# Patient Record
Sex: Female | Born: 1945 | Hispanic: No | Marital: Married | Smoking: Never smoker
Health system: Southern US, Community
[De-identification: ages and names within clinical notes are randomized; demographics above are authoritative.]

## PROBLEM LIST (undated history)

## (undated) DIAGNOSIS — Z923 Personal history of irradiation: Secondary | ICD-10-CM

## (undated) DIAGNOSIS — I5023 Acute on chronic systolic (congestive) heart failure: Secondary | ICD-10-CM

## (undated) DIAGNOSIS — E039 Hypothyroidism, unspecified: Secondary | ICD-10-CM

## (undated) DIAGNOSIS — I10 Essential (primary) hypertension: Secondary | ICD-10-CM

## (undated) DIAGNOSIS — C801 Malignant (primary) neoplasm, unspecified: Secondary | ICD-10-CM

## (undated) DIAGNOSIS — D239 Other benign neoplasm of skin, unspecified: Secondary | ICD-10-CM

## (undated) DIAGNOSIS — I519 Heart disease, unspecified: Secondary | ICD-10-CM

## (undated) DIAGNOSIS — G4733 Obstructive sleep apnea (adult) (pediatric): Secondary | ICD-10-CM

## (undated) DIAGNOSIS — E7800 Pure hypercholesterolemia, unspecified: Secondary | ICD-10-CM

## (undated) DIAGNOSIS — N289 Disorder of kidney and ureter, unspecified: Secondary | ICD-10-CM

## (undated) DIAGNOSIS — N179 Acute kidney failure, unspecified: Secondary | ICD-10-CM

## (undated) DIAGNOSIS — I427 Cardiomyopathy due to drug and external agent: Secondary | ICD-10-CM

## (undated) DIAGNOSIS — R1013 Epigastric pain: Secondary | ICD-10-CM

## (undated) DIAGNOSIS — I420 Dilated cardiomyopathy: Secondary | ICD-10-CM

## (undated) DIAGNOSIS — I959 Hypotension, unspecified: Secondary | ICD-10-CM

## (undated) DIAGNOSIS — T451X5A Adverse effect of antineoplastic and immunosuppressive drugs, initial encounter: Secondary | ICD-10-CM

## (undated) DIAGNOSIS — H409 Unspecified glaucoma: Secondary | ICD-10-CM

## (undated) DIAGNOSIS — K589 Irritable bowel syndrome without diarrhea: Secondary | ICD-10-CM

## (undated) DIAGNOSIS — E785 Hyperlipidemia, unspecified: Secondary | ICD-10-CM

## (undated) DIAGNOSIS — J45909 Unspecified asthma, uncomplicated: Secondary | ICD-10-CM

## (undated) DIAGNOSIS — I110 Hypertensive heart disease with heart failure: Secondary | ICD-10-CM

## (undated) DIAGNOSIS — C50919 Malignant neoplasm of unspecified site of unspecified female breast: Secondary | ICD-10-CM

## (undated) DIAGNOSIS — D649 Anemia, unspecified: Secondary | ICD-10-CM

## (undated) HISTORY — DX: Obstructive sleep apnea (adult) (pediatric): G47.33

## (undated) HISTORY — DX: Heart disease, unspecified: I51.9

## (undated) HISTORY — DX: Dilated cardiomyopathy: I42.0

## (undated) HISTORY — DX: Epigastric pain: R10.13

## (undated) HISTORY — PX: SHOULDER CLOSED REDUCTION: SHX1051

## (undated) HISTORY — DX: Other benign neoplasm of skin, unspecified: D23.9

## (undated) HISTORY — DX: Malignant (primary) neoplasm, unspecified: C80.1

## (undated) HISTORY — DX: Hypotension, unspecified: I95.9

## (undated) HISTORY — DX: Acute kidney failure, unspecified: N17.9

## (undated) HISTORY — DX: Essential (primary) hypertension: I10

## (undated) HISTORY — PX: ROTATOR CUFF REPAIR: SHX139

## (undated) HISTORY — DX: Hyperlipidemia, unspecified: E78.5

## (undated) HISTORY — DX: Irritable bowel syndrome without diarrhea: K58.9

## (undated) HISTORY — DX: Adverse effect of antineoplastic and immunosuppressive drugs, initial encounter: T45.1X5A

## (undated) HISTORY — DX: Hypertensive heart disease with heart failure: I11.0

## (undated) HISTORY — DX: Acute on chronic systolic (congestive) heart failure: I50.23

## (undated) HISTORY — DX: Cardiomyopathy due to drug and external agent: I42.7

## (undated) HISTORY — DX: Disorder of kidney and ureter, unspecified: N28.9

## (undated) HISTORY — DX: Anemia, unspecified: D64.9

## (undated) HISTORY — PX: CARDIAC CATHETERIZATION: SHX172

## (undated) HISTORY — DX: Unspecified asthma, uncomplicated: J45.909

## (undated) HISTORY — DX: Hypothyroidism, unspecified: E03.9

## (undated) HISTORY — DX: Unspecified glaucoma: H40.9

---

## 1999-04-14 DIAGNOSIS — Z923 Personal history of irradiation: Secondary | ICD-10-CM | POA: Insufficient documentation

## 1999-04-14 DIAGNOSIS — C50919 Malignant neoplasm of unspecified site of unspecified female breast: Secondary | ICD-10-CM

## 1999-04-14 HISTORY — DX: Personal history of irradiation: Z92.3

## 1999-04-14 HISTORY — DX: Malignant neoplasm of unspecified site of unspecified female breast: C50.919

## 1999-04-14 HISTORY — PX: BREAST LUMPECTOMY: SHX2

## 2002-09-27 ENCOUNTER — Encounter: Admission: RE | Admit: 2002-09-27 | Discharge: 2002-09-27 | Payer: Self-pay | Admitting: Hematology & Oncology

## 2002-09-27 ENCOUNTER — Encounter: Payer: Self-pay | Admitting: Hematology & Oncology

## 2003-09-28 ENCOUNTER — Encounter: Admission: RE | Admit: 2003-09-28 | Discharge: 2003-09-28 | Payer: Self-pay | Admitting: Hematology & Oncology

## 2004-01-17 ENCOUNTER — Encounter: Admission: RE | Admit: 2004-01-17 | Discharge: 2004-01-17 | Payer: Self-pay | Admitting: Family Medicine

## 2004-07-17 ENCOUNTER — Ambulatory Visit: Payer: Self-pay | Admitting: Hematology & Oncology

## 2004-08-12 ENCOUNTER — Encounter: Admission: RE | Admit: 2004-08-12 | Discharge: 2004-09-22 | Payer: Self-pay | Admitting: Orthopedic Surgery

## 2004-09-29 ENCOUNTER — Encounter: Admission: RE | Admit: 2004-09-29 | Discharge: 2004-09-29 | Payer: Self-pay | Admitting: Hematology & Oncology

## 2005-01-27 ENCOUNTER — Ambulatory Visit: Payer: Self-pay | Admitting: Hematology & Oncology

## 2005-07-28 ENCOUNTER — Ambulatory Visit: Payer: Self-pay | Admitting: Hematology & Oncology

## 2005-11-09 ENCOUNTER — Encounter: Admission: RE | Admit: 2005-11-09 | Discharge: 2005-11-09 | Payer: Self-pay | Admitting: Hematology & Oncology

## 2006-01-21 ENCOUNTER — Ambulatory Visit: Payer: Self-pay | Admitting: Hematology & Oncology

## 2006-01-22 ENCOUNTER — Encounter: Admission: RE | Admit: 2006-01-22 | Discharge: 2006-01-22 | Payer: Self-pay | Admitting: Family Medicine

## 2006-11-18 ENCOUNTER — Encounter: Admission: RE | Admit: 2006-11-18 | Discharge: 2006-11-18 | Payer: Self-pay | Admitting: Hematology & Oncology

## 2007-01-20 ENCOUNTER — Ambulatory Visit: Payer: Self-pay | Admitting: Hematology & Oncology

## 2007-01-24 LAB — CBC WITH DIFFERENTIAL/PLATELET
Basophils Absolute: 0.1 10*3/uL (ref 0.0–0.1)
Eosinophils Absolute: 0.4 10*3/uL (ref 0.0–0.5)
HCT: 34.7 % — ABNORMAL LOW (ref 34.8–46.6)
HGB: 11.6 g/dL (ref 11.6–15.9)
LYMPH%: 35.5 % (ref 14.0–48.0)
MONO#: 0.7 10*3/uL (ref 0.1–0.9)
NEUT#: 2.9 10*3/uL (ref 1.5–6.5)
NEUT%: 46.5 % (ref 39.6–76.8)
Platelets: 296 10*3/uL (ref 145–400)
WBC: 6.3 10*3/uL (ref 3.9–10.0)
lymph#: 2.2 10*3/uL (ref 0.9–3.3)

## 2007-01-24 LAB — COMPREHENSIVE METABOLIC PANEL
ALT: 17 U/L (ref 0–35)
CO2: 25 mEq/L (ref 19–32)
Calcium: 9.1 mg/dL (ref 8.4–10.5)
Chloride: 107 mEq/L (ref 96–112)
Creatinine, Ser: 0.88 mg/dL (ref 0.40–1.20)
Glucose, Bld: 96 mg/dL (ref 70–99)
Total Bilirubin: 0.5 mg/dL (ref 0.3–1.2)

## 2007-11-22 ENCOUNTER — Encounter: Admission: RE | Admit: 2007-11-22 | Discharge: 2007-11-22 | Payer: Self-pay | Admitting: Hematology & Oncology

## 2008-01-20 ENCOUNTER — Ambulatory Visit: Payer: Self-pay | Admitting: Hematology & Oncology

## 2008-01-23 LAB — CBC WITH DIFFERENTIAL (CANCER CENTER ONLY)
BASO%: 0.6 % (ref 0.0–2.0)
Eosinophils Absolute: 0.4 10*3/uL (ref 0.0–0.5)
LYMPH%: 21 % (ref 14.0–48.0)
MCV: 79 fL — ABNORMAL LOW (ref 81–101)
MONO#: 0.6 10*3/uL (ref 0.1–0.9)
NEUT#: 5.4 10*3/uL (ref 1.5–6.5)
Platelets: 277 10*3/uL (ref 145–400)
RBC: 4.53 10*6/uL (ref 3.70–5.32)
RDW: 13.9 % (ref 10.5–14.6)
WBC: 8.2 10*3/uL (ref 3.9–10.0)

## 2008-01-23 LAB — COMPREHENSIVE METABOLIC PANEL
ALT: 13 U/L (ref 0–35)
BUN: 17 mg/dL (ref 6–23)
CO2: 26 mEq/L (ref 19–32)
Calcium: 9.8 mg/dL (ref 8.4–10.5)
Creatinine, Ser: 0.93 mg/dL (ref 0.40–1.20)
Glucose, Bld: 76 mg/dL (ref 70–99)
Total Bilirubin: 0.5 mg/dL (ref 0.3–1.2)

## 2008-06-29 ENCOUNTER — Encounter: Admission: RE | Admit: 2008-06-29 | Discharge: 2008-06-29 | Payer: Self-pay | Admitting: Family Medicine

## 2008-12-14 ENCOUNTER — Encounter: Admission: RE | Admit: 2008-12-14 | Discharge: 2008-12-14 | Payer: Self-pay | Admitting: Family Medicine

## 2009-01-15 ENCOUNTER — Ambulatory Visit: Payer: Self-pay | Admitting: Hematology & Oncology

## 2009-01-16 LAB — CBC WITH DIFFERENTIAL (CANCER CENTER ONLY)
BASO%: 0.6 % (ref 0.0–2.0)
EOS%: 5.4 % (ref 0.0–7.0)
Eosinophils Absolute: 0.3 10*3/uL (ref 0.0–0.5)
LYMPH%: 29 % (ref 14.0–48.0)
MCH: 23.9 pg — ABNORMAL LOW (ref 26.0–34.0)
MCHC: 32.3 g/dL (ref 32.0–36.0)
MCV: 74 fL — ABNORMAL LOW (ref 81–101)
MONO%: 6.1 % (ref 0.0–13.0)
NEUT#: 3.7 10*3/uL (ref 1.5–6.5)
Platelets: 322 10*3/uL (ref 145–400)
RBC: 4.63 10*6/uL (ref 3.70–5.32)
RDW: 17 % — ABNORMAL HIGH (ref 10.5–14.6)

## 2009-01-17 LAB — COMPREHENSIVE METABOLIC PANEL
ALT: 17 U/L (ref 0–35)
AST: 16 U/L (ref 0–37)
Albumin: 4.2 g/dL (ref 3.5–5.2)
Alkaline Phosphatase: 49 U/L (ref 39–117)
BUN: 13 mg/dL (ref 6–23)
CO2: 25 mEq/L (ref 19–32)
Calcium: 9 mg/dL (ref 8.4–10.5)
Chloride: 104 mEq/L (ref 96–112)
Creatinine, Ser: 1.15 mg/dL (ref 0.40–1.20)
Glucose, Bld: 80 mg/dL (ref 70–99)
Potassium: 4.8 mEq/L (ref 3.5–5.3)
Sodium: 139 mEq/L (ref 135–145)
Total Bilirubin: 0.5 mg/dL (ref 0.3–1.2)
Total Protein: 6.8 g/dL (ref 6.0–8.3)

## 2009-01-21 LAB — FECAL OCCULT BLOOD, GUIAC - CHCC SATELLITE: Occult Blood: NEGATIVE

## 2009-02-12 LAB — CBC WITH DIFFERENTIAL (CANCER CENTER ONLY)
BASO#: 0 10*3/uL (ref 0.0–0.2)
EOS%: 3.5 % (ref 0.0–7.0)
Eosinophils Absolute: 0.2 10*3/uL (ref 0.0–0.5)
HGB: 11.4 g/dL — ABNORMAL LOW (ref 11.6–15.9)
LYMPH%: 25.5 % (ref 14.0–48.0)
MCH: 25.5 pg — ABNORMAL LOW (ref 26.0–34.0)
MCHC: 33.4 g/dL (ref 32.0–36.0)
MCV: 76 fL — ABNORMAL LOW (ref 81–101)
MONO%: 7.6 % (ref 0.0–13.0)
NEUT#: 3.6 10*3/uL (ref 1.5–6.5)
RBC: 4.49 10*6/uL (ref 3.70–5.32)

## 2009-02-12 LAB — RETICULOCYTES (CHCC)
ABS Retic: 72.3 10*3/uL (ref 19.0–186.0)
RBC.: 4.52 MIL/uL (ref 3.87–5.11)
Retic Ct Pct: 1.6 % (ref 0.4–3.1)

## 2009-02-12 LAB — THYROID PANEL WITH TSH - CHCC
Free Thyroxine Index: 4.7 — ABNORMAL HIGH (ref 1.0–3.9)
T3 Uptake: 34 % (ref 22.5–37.0)
T4, Total: 13.9 ug/dL — ABNORMAL HIGH (ref 5.0–12.5)
TSH: 0.254 u[IU]/mL — ABNORMAL LOW (ref 0.350–4.500)

## 2009-02-12 LAB — CHCC SATELLITE - SMEAR

## 2009-02-12 LAB — FERRITIN: Ferritin: 489 ng/mL — ABNORMAL HIGH (ref 10–291)

## 2009-02-12 LAB — T3: T3, Total: 108.5 ng/dL (ref 80.0–204.0)

## 2009-04-22 ENCOUNTER — Ambulatory Visit: Payer: Self-pay | Admitting: Hematology & Oncology

## 2009-04-24 LAB — CBC WITH DIFFERENTIAL (CANCER CENTER ONLY)
BASO#: 0 10*3/uL (ref 0.0–0.2)
BASO%: 0.5 % (ref 0.0–2.0)
EOS%: 4.1 % (ref 0.0–7.0)
Eosinophils Absolute: 0.3 10*3/uL (ref 0.0–0.5)
HCT: 39.8 % (ref 34.8–46.6)
HGB: 13 g/dL (ref 11.6–15.9)
LYMPH#: 1.4 10*3/uL (ref 0.9–3.3)
LYMPH%: 23.5 % (ref 14.0–48.0)
MCH: 27.7 pg (ref 26.0–34.0)
MCHC: 32.7 g/dL (ref 32.0–36.0)
MCV: 85 fL (ref 81–101)
MONO#: 0.4 10*3/uL (ref 0.1–0.9)
MONO%: 6.8 % (ref 0.0–13.0)
NEUT#: 4 10*3/uL (ref 1.5–6.5)
NEUT%: 65.1 % (ref 39.6–80.0)
Platelets: 280 10*3/uL (ref 145–400)
RBC: 4.69 10*6/uL (ref 3.70–5.32)
RDW: 13.3 % (ref 10.5–14.6)
WBC: 6.1 10*3/uL (ref 3.9–10.0)

## 2009-04-24 LAB — FERRITIN: Ferritin: 153 ng/mL (ref 10–291)

## 2009-04-24 LAB — RETICULOCYTES (CHCC)
ABS Retic: 55.9 10*3/uL (ref 19.0–186.0)
RBC.: 4.66 MIL/uL (ref 3.87–5.11)
Retic Ct Pct: 1.2 % (ref 0.4–3.1)

## 2009-04-24 LAB — CHCC SATELLITE - SMEAR

## 2009-10-16 ENCOUNTER — Ambulatory Visit: Payer: Self-pay | Admitting: Hematology & Oncology

## 2009-10-17 LAB — CBC WITH DIFFERENTIAL (CANCER CENTER ONLY)
BASO#: 0.1 10*3/uL (ref 0.0–0.2)
BASO%: 0.8 % (ref 0.0–2.0)
EOS%: 6.2 % (ref 0.0–7.0)
Eosinophils Absolute: 0.4 10*3/uL (ref 0.0–0.5)
HCT: 41 % (ref 34.8–46.6)
HGB: 13.6 g/dL (ref 11.6–15.9)
LYMPH#: 2 10*3/uL (ref 0.9–3.3)
LYMPH%: 29.2 % (ref 14.0–48.0)
MCH: 28 pg (ref 26.0–34.0)
MCHC: 33.1 g/dL (ref 32.0–36.0)
MCV: 85 fL (ref 81–101)
MONO#: 0.6 10*3/uL (ref 0.1–0.9)
MONO%: 8.5 % (ref 0.0–13.0)
NEUT#: 3.8 10*3/uL (ref 1.5–6.5)
NEUT%: 55.3 % (ref 39.6–80.0)
Platelets: 310 10*3/uL (ref 145–400)
RBC: 4.83 10*6/uL (ref 3.70–5.32)
RDW: 11.5 % (ref 10.5–14.6)
WBC: 6.9 10*3/uL (ref 3.9–10.0)

## 2009-10-17 LAB — COMPREHENSIVE METABOLIC PANEL
ALT: 16 U/L (ref 0–35)
AST: 16 U/L (ref 0–37)
Albumin: 4.6 g/dL (ref 3.5–5.2)
Alkaline Phosphatase: 58 U/L (ref 39–117)
BUN: 22 mg/dL (ref 6–23)
CO2: 22 mEq/L (ref 19–32)
Calcium: 9.8 mg/dL (ref 8.4–10.5)
Chloride: 103 mEq/L (ref 96–112)
Creatinine, Ser: 0.97 mg/dL (ref 0.40–1.20)
Glucose, Bld: 92 mg/dL (ref 70–99)
Potassium: 4.6 mEq/L (ref 3.5–5.3)
Sodium: 137 mEq/L (ref 135–145)
Total Bilirubin: 0.4 mg/dL (ref 0.3–1.2)
Total Protein: 7.1 g/dL (ref 6.0–8.3)

## 2009-10-17 LAB — FERRITIN: Ferritin: 72 ng/mL (ref 10–291)

## 2010-01-08 ENCOUNTER — Encounter: Admission: RE | Admit: 2010-01-08 | Discharge: 2010-01-08 | Payer: Self-pay | Admitting: Hematology & Oncology

## 2010-03-18 ENCOUNTER — Ambulatory Visit: Payer: Self-pay | Admitting: Hematology & Oncology

## 2010-04-17 ENCOUNTER — Emergency Department (HOSPITAL_BASED_OUTPATIENT_CLINIC_OR_DEPARTMENT_OTHER)
Admission: EM | Admit: 2010-04-17 | Discharge: 2010-04-17 | Payer: Self-pay | Source: Home / Self Care | Admitting: Emergency Medicine

## 2010-05-04 ENCOUNTER — Encounter: Payer: Self-pay | Admitting: Family Medicine

## 2010-05-05 ENCOUNTER — Encounter: Payer: Self-pay | Admitting: Family Medicine

## 2010-06-17 ENCOUNTER — Other Ambulatory Visit: Payer: Self-pay | Admitting: Family Medicine

## 2010-06-17 DIAGNOSIS — M8580 Other specified disorders of bone density and structure, unspecified site: Secondary | ICD-10-CM

## 2010-07-01 ENCOUNTER — Ambulatory Visit
Admission: RE | Admit: 2010-07-01 | Discharge: 2010-07-01 | Disposition: A | Discharge status: Home / Self Care | Payer: Medicare Other | Source: Ambulatory Visit | Attending: Family Medicine | Admitting: Family Medicine

## 2010-07-01 DIAGNOSIS — M8580 Other specified disorders of bone density and structure, unspecified site: Secondary | ICD-10-CM

## 2010-12-17 ENCOUNTER — Other Ambulatory Visit: Payer: Self-pay | Admitting: Family Medicine

## 2010-12-17 DIAGNOSIS — Z1231 Encounter for screening mammogram for malignant neoplasm of breast: Secondary | ICD-10-CM

## 2011-01-21 ENCOUNTER — Ambulatory Visit
Admission: RE | Admit: 2011-01-21 | Discharge: 2011-01-21 | Disposition: A | Discharge status: Home / Self Care | Payer: Medicare Other | Source: Ambulatory Visit | Attending: Family Medicine | Admitting: Family Medicine

## 2011-01-21 DIAGNOSIS — Z1231 Encounter for screening mammogram for malignant neoplasm of breast: Secondary | ICD-10-CM

## 2012-11-24 ENCOUNTER — Emergency Department (HOSPITAL_BASED_OUTPATIENT_CLINIC_OR_DEPARTMENT_OTHER)
Admission: EM | Admit: 2012-11-24 | Discharge: 2012-11-24 | Disposition: A | Discharge status: Home / Self Care | Payer: Medicare Other | Attending: Emergency Medicine | Admitting: Emergency Medicine

## 2012-11-24 ENCOUNTER — Emergency Department (HOSPITAL_BASED_OUTPATIENT_CLINIC_OR_DEPARTMENT_OTHER): Payer: Medicare Other

## 2012-11-24 ENCOUNTER — Encounter (HOSPITAL_BASED_OUTPATIENT_CLINIC_OR_DEPARTMENT_OTHER): Payer: Self-pay | Admitting: *Deleted

## 2012-11-24 DIAGNOSIS — Y939 Activity, unspecified: Secondary | ICD-10-CM | POA: Insufficient documentation

## 2012-11-24 DIAGNOSIS — W1809XA Striking against other object with subsequent fall, initial encounter: Secondary | ICD-10-CM | POA: Insufficient documentation

## 2012-11-24 DIAGNOSIS — S0990XA Unspecified injury of head, initial encounter: Secondary | ICD-10-CM

## 2012-11-24 DIAGNOSIS — S0003XA Contusion of scalp, initial encounter: Secondary | ICD-10-CM | POA: Insufficient documentation

## 2012-11-24 DIAGNOSIS — S0083XA Contusion of other part of head, initial encounter: Secondary | ICD-10-CM

## 2012-11-24 DIAGNOSIS — S0993XA Unspecified injury of face, initial encounter: Secondary | ICD-10-CM | POA: Insufficient documentation

## 2012-11-24 DIAGNOSIS — Y929 Unspecified place or not applicable: Secondary | ICD-10-CM | POA: Insufficient documentation

## 2012-11-24 DIAGNOSIS — I10 Essential (primary) hypertension: Secondary | ICD-10-CM | POA: Insufficient documentation

## 2012-11-24 HISTORY — DX: Essential (primary) hypertension: I10

## 2012-11-24 HISTORY — DX: Pure hypercholesterolemia, unspecified: E78.00

## 2012-11-24 NOTE — ED Notes (Signed)
Pt fell this AM and has hematoma to forehead, denies LOC. No dizziness or blurred vision.

## 2012-11-24 NOTE — ED Provider Notes (Signed)
CSN: [id number]     Arrival date & time 11/24/12  2002 History     First MD Initiated Contact with Patient 11/24/12 2010     Chief Complaint  Patient presents with  . Head Injury   (Consider location/radiation/quality/duration/timing/severity/associated sxs/prior Treatment) Patient is a 67 y.o. female presenting with head injury.  Head Injury Location:  Frontal Time since incident:  8 hours Mechanism of injury: fall   Pain details:    Quality:  Dull   Severity:  Mild   Timing:  Constant   Progression:  Unchanged Chronicity:  New Relieved by:  Nothing Worsened by:  Nothing tried Ineffective treatments:  None tried Associated symptoms: neck pain (chronic right sided neck pain)   Associated symptoms: no blurred vision, no difficulty breathing, no focal weakness, no loss of consciousness, no nausea, no numbness and no vomiting     Past Medical History  Diagnosis Date  . Hypertension   . High cholesterol    Past Surgical History  Procedure Laterality Date  . Cesarean section    . Shoulder closed reduction    . Rotator cuff repair     No family history on file. History  Substance Use Topics  . Smoking status: Never Smoker   . Smokeless tobacco: Not on file  . Alcohol Use: No   OB History   Grav Para Term Preterm Abortions TAB SAB Ect Mult Living                 Review of Systems  Constitutional: Negative for fever.  HENT: Positive for neck pain (chronic right sided neck pain). Negative for congestion.   Eyes: Negative for blurred vision.  Respiratory: Negative for cough and shortness of breath.   Cardiovascular: Negative for chest pain.  Gastrointestinal: Negative for nausea, vomiting, abdominal pain and diarrhea.  Neurological: Negative for focal weakness, loss of consciousness and numbness.  All other systems reviewed and are negative.    Allergies  Review of patient's allergies indicates no known allergies.  Home Medications   Current Outpatient Rx   Name  Route  Sig  Dispense  Refill  . alendronate (FOSAMAX) 5 MG tablet   Oral   Take 5 mg by mouth daily before breakfast. Take with a full glass of water on an empty stomach.         . cyclobenzaprine (FLEXERIL) 5 MG tablet   Oral   Take 5 mg by mouth 3 (three) times daily as needed for muscle spasms.         . metoprolol succinate (TOPROL-XL) 25 MG 24 hr tablet   Oral   Take 25 mg by mouth daily.         . predniSONE (DELTASONE) 50 MG tablet   Oral   Take 50 mg by mouth daily.         . simvastatin (ZOCOR) 10 MG tablet   Oral   Take 10 mg by mouth at bedtime.         . traMADol (ULTRAM) 50 MG tablet   Oral   Take 50 mg by mouth every 6 (six) hours as needed for pain.          BP 146/74  Pulse 110  Temp(Src) 98 F (36.7 C) (Oral)  Resp 20  Ht 5' (1.524 m)  Wt 138 lb (62.596 kg)  BMI 26.95 kg/m2  SpO2 100% Physical Exam  Nursing note and vitals reviewed. Constitutional: She is oriented to person, place, and time. She appears  well-developed and well-nourished. No distress.  HENT:  Head: Normocephalic. Head is with contusion (frontal).  Mouth/Throat: Oropharynx is clear and moist.  Eyes: Conjunctivae are normal. Pupils are equal, round, and reactive to light. No scleral icterus.  Neck: Normal range of motion. Neck supple. Muscular tenderness (right sided) present. No spinous process tenderness present. Normal range of motion present.  Cardiovascular: Normal rate, regular rhythm, normal heart sounds and intact distal pulses.   No murmur heard. Pulmonary/Chest: Effort normal and breath sounds normal. No stridor. No respiratory distress. She has no rales.  Abdominal: Soft. Bowel sounds are normal. She exhibits no distension. There is no tenderness.  Musculoskeletal: Normal range of motion.       Left knee: She exhibits swelling and ecchymosis. She exhibits normal range of motion, no deformity and no laceration. Tenderness (minimal) found.       Thoracic  back: She exhibits no tenderness.       Lumbar back: She exhibits no tenderness.  Neurological: She is alert and oriented to person, place, and time.  Skin: Skin is warm and dry. No rash noted.  Psychiatric: She has a normal mood and affect. Her behavior is normal.    ED Course   Procedures (including critical care time)  Labs Reviewed - No data to display Ct Head Wo Contrast  11/24/2012   *RADIOLOGY REPORT*  Clinical Data: Larey Seat and hit forehead.  CT HEAD WITHOUT CONTRAST  Technique:  Contiguous axial images were obtained from the base of the skull through the vertex without contrast.  Comparison: None.  Findings: Bone windows demonstrate left paracentral supraorbital soft tissue swelling.  Mild to moderate.  Normal imaged globes and orbits.  No underlying skull fracture.  Mild hyperostosis frontalis interna. Clear paranasal sinuses and mastoid air cells.  Soft tissue windows demonstrate no  mass lesion, hemorrhage, hydrocephalus, acute infarct, intra-axial, or extra-axial fluid collection.  IMPRESSION:  1.  Supraorbital scalp soft tissue swelling. 2. No acute intracranial abnormality.   Original Report Authenticated By: Jeronimo Greaves, M.D.  All radiology studies independently viewed by me.     1. Closed head injury, initial encounter   2. Forehead contusion, initial encounter     MDM  67 yo female on aspirin presenting several hours after a mechanical fall down 2 steps hitting forehead on concrete. Denies syncope or LOC.  Frontal contusion, but no other deformity.  No midline neck pain or pain with ROM.  Says her chronic right sided neck pain is getting better.  Plan CT head.  Do not think her knee needs imaging.    CT neg  Candyce Churn, MD 11/25/12 1050

## 2012-11-24 NOTE — ED Notes (Signed)
MD at bedside. 

## 2014-01-31 ENCOUNTER — Telehealth: Payer: Self-pay | Admitting: Hematology & Oncology

## 2014-01-31 NOTE — Telephone Encounter (Signed)
Pt made 11-27 appointment said there wasn't anything urgent she just needs to follow up with him.

## 2014-01-31 NOTE — Telephone Encounter (Signed)
PT LEFT MESSAGE TO SCHEDULE APPOINTMENT, i LEFT HER MESSAGE TO CALL.

## 2014-02-22 ENCOUNTER — Other Ambulatory Visit: Payer: Self-pay | Admitting: *Deleted

## 2014-02-23 ENCOUNTER — Encounter: Payer: Self-pay | Admitting: Hematology & Oncology

## 2014-02-23 ENCOUNTER — Ambulatory Visit (HOSPITAL_BASED_OUTPATIENT_CLINIC_OR_DEPARTMENT_OTHER): Payer: Medicare PPO | Admitting: Lab

## 2014-02-23 ENCOUNTER — Ambulatory Visit (HOSPITAL_BASED_OUTPATIENT_CLINIC_OR_DEPARTMENT_OTHER): Payer: Medicare PPO | Admitting: Hematology & Oncology

## 2014-02-23 ENCOUNTER — Ambulatory Visit (HOSPITAL_BASED_OUTPATIENT_CLINIC_OR_DEPARTMENT_OTHER)
Admission: RE | Admit: 2014-02-23 | Discharge: 2014-02-23 | Disposition: A | Discharge status: Home / Self Care | Payer: Medicare PPO | Source: Ambulatory Visit | Attending: Hematology & Oncology | Admitting: Hematology & Oncology

## 2014-02-23 VITALS — BP 104/58 | HR 91 | Temp 98.1°F | Resp 14 | Ht 61.0 in | Wt 142.0 lb

## 2014-02-23 DIAGNOSIS — Z170 Estrogen receptor positive status [ER+]: Secondary | ICD-10-CM

## 2014-02-23 DIAGNOSIS — M810 Age-related osteoporosis without current pathological fracture: Secondary | ICD-10-CM

## 2014-02-23 DIAGNOSIS — Z853 Personal history of malignant neoplasm of breast: Secondary | ICD-10-CM | POA: Diagnosis not present

## 2014-02-23 DIAGNOSIS — C50912 Malignant neoplasm of unspecified site of left female breast: Secondary | ICD-10-CM

## 2014-02-23 DIAGNOSIS — D500 Iron deficiency anemia secondary to blood loss (chronic): Secondary | ICD-10-CM

## 2014-02-23 DIAGNOSIS — R058 Other specified cough: Secondary | ICD-10-CM

## 2014-02-23 DIAGNOSIS — R05 Cough: Secondary | ICD-10-CM

## 2014-02-23 LAB — COMPREHENSIVE METABOLIC PANEL
ALBUMIN: 3.9 g/dL (ref 3.5–5.2)
ALT: 17 U/L (ref 0–35)
AST: 15 U/L (ref 0–37)
Alkaline Phosphatase: 55 U/L (ref 39–117)
BUN: 13 mg/dL (ref 6–23)
CALCIUM: 9.2 mg/dL (ref 8.4–10.5)
CHLORIDE: 101 meq/L (ref 96–112)
CO2: 27 mEq/L (ref 19–32)
Creatinine, Ser: 0.98 mg/dL (ref 0.50–1.10)
GLUCOSE: 119 mg/dL — AB (ref 70–99)
POTASSIUM: 3.9 meq/L (ref 3.5–5.3)
Sodium: 137 mEq/L (ref 135–145)
Total Bilirubin: 0.8 mg/dL (ref 0.2–1.2)
Total Protein: 6.4 g/dL (ref 6.0–8.3)

## 2014-02-23 LAB — CBC WITH DIFFERENTIAL (CANCER CENTER ONLY)
BASO#: 0 10*3/uL (ref 0.0–0.2)
BASO%: 0.4 % (ref 0.0–2.0)
EOS ABS: 0.3 10*3/uL (ref 0.0–0.5)
EOS%: 3.5 % (ref 0.0–7.0)
HCT: 37.3 % (ref 34.8–46.6)
HEMOGLOBIN: 12 g/dL (ref 11.6–15.9)
LYMPH#: 1.8 10*3/uL (ref 0.9–3.3)
LYMPH%: 21.9 % (ref 14.0–48.0)
MCH: 25.1 pg — ABNORMAL LOW (ref 26.0–34.0)
MCHC: 32.2 g/dL (ref 32.0–36.0)
MCV: 78 fL — ABNORMAL LOW (ref 81–101)
MONO#: 0.6 10*3/uL (ref 0.1–0.9)
MONO%: 6.9 % (ref 0.0–13.0)
NEUT%: 67.3 % (ref 39.6–80.0)
NEUTROS ABS: 5.6 10*3/uL (ref 1.5–6.5)
Platelets: 274 10*3/uL (ref 145–400)
RBC: 4.78 10*6/uL (ref 3.70–5.32)
RDW: 17.3 % — ABNORMAL HIGH (ref 11.1–15.7)
WBC: 8.3 10*3/uL (ref 3.9–10.0)

## 2014-02-23 LAB — IRON AND TIBC CHCC
%SAT: 13 % — ABNORMAL LOW (ref 21–57)
IRON: 57 ug/dL (ref 41–142)
TIBC: 435 ug/dL (ref 236–444)
UIBC: 378 ug/dL (ref 120–384)

## 2014-02-23 LAB — FERRITIN CHCC: Ferritin: 9 ng/ml (ref 9–269)

## 2014-02-25 NOTE — Progress Notes (Signed)
Hematology and Oncology Follow Up Visit  Nichole Johnson [id number] 28-Jul-1945 68 y.o. 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 I last saw her back 4 years ago. She got worried about her breast cancer coming back. Apparently, a friend had breast cancer and then it came back and killed her. This concern Nichole Johnson so she wanted come back to be seen.  Since we last saw her, she's been doing okay. She really had no symptoms. She's had no weight loss or weight gain. She still working. Her family is doing well.  She's had no bleeding. She's had no nausea or vomiting. She does get her mammograms routinely.  She's had no change in bowel or bladder habits. Medications: Current outpatient prescriptions: alendronate (FOSAMAX) 5 MG tablet, Take 5 mg by mouth daily before breakfast. Take with a full glass of water on an empty stomach., Disp: , Rfl: ;  Aspirin (ASPIR-81 PO), Take by mouth every morning., Disp: , Rfl: ;  brinzolamide (AZOPT) 1 % ophthalmic suspension, Place 1 drop into both eyes 2 (two) times daily., Disp: , Rfl:  Calcium Carbonate-Vitamin D (CALCIUM + D PO), Take by mouth 2 (two) times daily., Disp: , Rfl: ;  Cetirizine HCl (ZYRTEC ALLERGY PO), Take by mouth every morning., Disp: , Rfl: ;  Fish Oil OIL, Take by mouth 2 (two) times daily., Disp: , Rfl: ;  metoprolol succinate (TOPROL-XL) 25 MG 24 hr tablet, Take 25 mg by mouth daily., Disp: , Rfl: ;  Misc Natural Products (GLUCOSAMINE CHONDROITIN ADV) TABS, Take by mouth 2 (two) times daily., Disp: , Rfl:  Multiple Vitamins-Minerals (CENTRUM SILVER PO), Take by mouth every morning., Disp: , Rfl: ;  simvastatin (ZOCOR) 10 MG tablet, Take 10 mg by mouth at bedtime., Disp: , Rfl: ;  Travoprost, BAK Free, (TRAVATAN Z) 0.004 % SOLN ophthalmic solution, Place 1 drop into  both eyes at bedtime., Disp: , Rfl: ;  vitamin C (ASCORBIC ACID) 500 MG tablet, Take 500 mg by mouth daily., Disp: , Rfl:   Allergies: No Known Allergies  Past Medical History, Surgical history, Social history, and Family History were reviewed and updated.  Review of Systems: As above  Physical Exam:  height is 5\' 1"  (1.549 m) and weight is 142 lb (64.411 kg). Her oral temperature is 98.1 F (36.7 C). Her blood pressure is 104/58 and her pulse is 91. Her respiration is 14.   Well-developed and well-nourished Nichole Johnson in no obvious distress. Head and exam shows no ocular or oral lesions. She has no palpable cervical or supraclavicular lymph nodes. She has no palpable thyroid. Lungs are clear. Cardiac exam regular in rhythm with no murmurs, rubs or bruits. Breast exam shows her right breast no masses, edema or erythema. There is no right axillary adenopathy. Left breast shows a well-healed lumpectomy at the 12:00 position. There is some slight contraction of the left breast from radiation surgery. No distinct masses noted in the left breast. There is no left axillary adenopathy. Abdomen is soft. She has good bowel sounds. There is no fluid wave. There is no palpable liver or spleen tip. Back exam shows no tenderness over the spine, ribs or hips. Extremity shows no clubbing, cyanosis or edema. Neurological exam is nonfocal.  Lab Results  Component Value Date   WBC  8.3 02/23/2014   HGB 12.0 02/23/2014   HCT 37.3 02/23/2014   MCV 78* 02/23/2014   PLT 274 02/23/2014     Chemistry      Component Value Date/Time   NA 137 02/23/2014 1134   K 3.9 02/23/2014 1134   CL 101 02/23/2014 1134   CO2 27 02/23/2014 1134   BUN 13 02/23/2014 1134   CREATININE 0.98 02/23/2014 1134      Component Value Date/Time   CALCIUM 9.2 02/23/2014 1134   ALKPHOS 55 02/23/2014 1134   AST 15 02/23/2014 1134   ALT 17 02/23/2014 1134   BILITOT 0.8 02/23/2014 1134         Impression and Plan: Nichole Johnson is 68 year old Nichole Johnson. She has a history of early-stage breast cancer of the left breast. She was node negative. Her tumor is ER positive. She was on tamoxifen for 5 years.  I do not see any evidence of recurrence. I reassured her that the chance of her recurrent would be less than 10%.  I think we can follow her along. She would like to be followed. I'll have her come back and see [place] in one year.   [name], MD 11/15/20152:07 PM

## 2014-02-26 ENCOUNTER — Telehealth: Payer: Self-pay | Admitting: *Deleted

## 2014-02-26 DIAGNOSIS — J302 Other seasonal allergic rhinitis: Secondary | ICD-10-CM

## 2014-02-26 HISTORY — DX: Other seasonal allergic rhinitis: J30.2

## 2014-02-26 NOTE — Telephone Encounter (Signed)
Pt called questioning what she needs to do to increase her iron levels. Informed pt that Dr Marin Olp wants her to take 325mg  of Iron each day. Pt verbalized understanding.

## 2014-02-26 NOTE — Telephone Encounter (Addendum)
Spoke with patient. Happy with results. Her last colonscopy was 7 years ago. I will let Dr Marin Olp know. Patient asked that I faxed results to PCP.  ----- Message from [name], MD sent at 02/23/2014  5:56 PM EST ----- Please call and let her know that the chest x-ray is clear. There is no pneumonia. There is no fluid. Heart looks okay. Nichole Johnson  Please call and let her know that the labs look okay although her iron is low again. When was her last colonoscopy? [name]

## 2014-03-09 ENCOUNTER — Other Ambulatory Visit: Payer: Medicare Other | Admitting: Lab

## 2014-03-09 ENCOUNTER — Ambulatory Visit: Payer: Medicare Other | Admitting: Hematology & Oncology

## 2014-04-18 DIAGNOSIS — E039 Hypothyroidism, unspecified: Secondary | ICD-10-CM

## 2014-04-18 DIAGNOSIS — I519 Heart disease, unspecified: Secondary | ICD-10-CM | POA: Insufficient documentation

## 2014-04-18 DIAGNOSIS — E785 Hyperlipidemia, unspecified: Secondary | ICD-10-CM

## 2014-04-18 DIAGNOSIS — D649 Anemia, unspecified: Secondary | ICD-10-CM

## 2014-04-18 HISTORY — DX: Anemia, unspecified: D64.9

## 2014-04-18 HISTORY — DX: Heart disease, unspecified: I51.9

## 2014-04-18 HISTORY — DX: Hyperlipidemia, unspecified: E78.5

## 2014-04-18 HISTORY — DX: Hypothyroidism, unspecified: E03.9

## 2014-12-10 DIAGNOSIS — K589 Irritable bowel syndrome without diarrhea: Secondary | ICD-10-CM | POA: Insufficient documentation

## 2014-12-10 HISTORY — DX: Irritable bowel syndrome, unspecified: K58.9

## 2015-02-22 ENCOUNTER — Ambulatory Visit: Payer: Medicare Other | Admitting: Hematology & Oncology

## 2015-02-22 ENCOUNTER — Other Ambulatory Visit: Payer: Medicare Other

## 2015-07-05 ENCOUNTER — Other Ambulatory Visit: Payer: Self-pay

## 2015-07-05 DIAGNOSIS — Z1231 Encounter for screening mammogram for malignant neoplasm of breast: Secondary | ICD-10-CM

## 2015-07-10 ENCOUNTER — Ambulatory Visit
Admission: RE | Admit: 2015-07-10 | Discharge: 2015-07-10 | Disposition: A | Discharge status: Home / Self Care | Payer: Medicare PPO | Source: Ambulatory Visit

## 2015-07-10 DIAGNOSIS — Z1231 Encounter for screening mammogram for malignant neoplasm of breast: Secondary | ICD-10-CM

## 2015-11-15 ENCOUNTER — Ambulatory Visit: Payer: Self-pay | Admitting: Orthopedic Surgery

## 2015-12-02 ENCOUNTER — Encounter (HOSPITAL_COMMUNITY): Payer: Medicare HMO

## 2015-12-09 ENCOUNTER — Inpatient Hospital Stay: Admit: 2015-12-09 | Payer: Medicare HMO | Admitting: Orthopedic Surgery

## 2015-12-09 SURGERY — ARTHROPLASTY, KNEE, TOTAL
Anesthesia: Choice | Site: Knee | Laterality: Right

## 2015-12-27 ENCOUNTER — Inpatient Hospital Stay (HOSPITAL_COMMUNITY): Admission: RE | Admit: 2015-12-27 | Payer: Medicare HMO | Source: Ambulatory Visit

## 2016-01-01 ENCOUNTER — Encounter: Payer: Self-pay | Admitting: Pulmonary Disease

## 2016-01-01 ENCOUNTER — Ambulatory Visit (INDEPENDENT_AMBULATORY_CARE_PROVIDER_SITE_OTHER): Payer: Medicare HMO | Admitting: Pulmonary Disease

## 2016-01-01 ENCOUNTER — Encounter (INDEPENDENT_AMBULATORY_CARE_PROVIDER_SITE_OTHER): Payer: Self-pay

## 2016-01-01 ENCOUNTER — Telehealth: Payer: Self-pay | Admitting: Pulmonary Disease

## 2016-01-01 VITALS — BP 102/70 | HR 93 | Ht 61.0 in | Wt 136.8 lb

## 2016-01-01 DIAGNOSIS — R05 Cough: Secondary | ICD-10-CM

## 2016-01-01 DIAGNOSIS — R059 Cough, unspecified: Secondary | ICD-10-CM

## 2016-01-01 LAB — NITRIC OXIDE: NITRIC OXIDE: 41

## 2016-01-01 MED ORDER — ALBUTEROL SULFATE HFA 108 (90 BASE) MCG/ACT IN AERS: 2.0000 | INHALATION_SPRAY | Freq: Four times a day (QID) | RESPIRATORY_TRACT | 6 refills | Status: AC | PRN

## 2016-01-01 MED ORDER — FLUTICASONE PROPIONATE 50 MCG/ACT NA SUSP: 2.0000 | Freq: Every day | NASAL | 2 refills | Status: AC

## 2016-01-01 MED ORDER — BECLOMETHASONE DIPROPIONATE 80 MCG/ACT IN AERS: 2.0000 | INHALATION_SPRAY | Freq: Two times a day (BID) | RESPIRATORY_TRACT | 6 refills | Status: DC

## 2016-01-01 NOTE — Progress Notes (Signed)
Nichole Johnson    [id number]    Jun 06, 1945  Primary Care 79 M., MD  Referring Physician: Robyne Peers, MD [id number] [place], [place] [address]  Chief complaint:  Evaluation for chronic cough.  HPI: Nichole Johnson is a 70 year old with past medical history of breast cancer. She has complains of nonproductive cough for the past 2-3 weeks. She was seen by her primary care physician and initially given a prednisone taper and then a Z-Pak with no improvement in symptoms. She had a chest x-ray earlier this month which showed mild bronchial wall thickening. Her chief complaint is cough, nonproductive in nature, and not associated with dyspnea. Chest occasional wheeze. She tried her husband's albuterol yesterday with partial relief in symptoms. She is history of seasonal allergies mostly in the spring, she denies any rhinitis, sinusitis. She does not have any heartburn symptoms.   Outpatient Encounter Prescriptions as of 01/01/2016  Medication Sig  . Aspirin (ASPIR-81 PO) Take by mouth every morning.  . brinzolamide (AZOPT) 1 % ophthalmic suspension Place 1 drop into both eyes 2 (two) times daily.  . Calcium Carbonate-Vitamin D (CALCIUM + D PO) Take by mouth 2 (two) times daily.  . Cetirizine HCl (ZYRTEC ALLERGY PO) Take by mouth every morning.  . Fish Oil OIL Take by mouth 2 (two) times daily.  Marland Kitchen levothyroxine (SYNTHROID, LEVOTHROID) 112 MCG tablet Take 112 mcg by mouth daily before breakfast.  . metoprolol succinate (TOPROL-XL) 50 MG 24 hr tablet Take 50 mg by mouth daily. Take with or immediately following a meal.  . Multiple Vitamins-Minerals (CENTRUM SILVER PO) Take by mouth every morning.  . simvastatin (ZOCOR) 10 MG tablet Take 10 mg by mouth at bedtime.  . Travoprost, BAK Free, (TRAVATAN Z) 0.004 % SOLN ophthalmic solution Place 1 drop into both eyes at bedtime.  . vitamin C (ASCORBIC ACID) 500 MG tablet Take 500 mg by mouth daily.  Marland Kitchen  alendronate (FOSAMAX) 5 MG tablet Take 5 mg by mouth daily before breakfast. Take with a full glass of water on an empty stomach.  . metoprolol succinate (TOPROL-XL) 25 MG 24 hr tablet Take 25 mg by mouth daily.  . Misc Natural Products (GLUCOSAMINE CHONDROITIN ADV) TABS Take by mouth 2 (two) times daily.   No facility-administered encounter medications on file as of 01/01/2016.     Allergies as of 01/01/2016  . (No Known Allergies)    Past Medical History:  Diagnosis Date  . Cancer ([facility])   . High cholesterol   . Hypertension     Past Surgical History:  Procedure Laterality Date  . CESAREAN SECTION    . ROTATOR CUFF REPAIR    . SHOULDER CLOSED REDUCTION      No family history on file.  Social History   Social History  . Marital status: Married    Spouse name: N/A  . Number of children: N/A  . Years of education: N/A   Occupational History  . Not on file.   Social History Main Topics  . Smoking status: Never Smoker  . Smokeless tobacco: Never Used     Comment: NEVER USED TOBACCO  . Alcohol use No  . Drug use: No  . Sexual activity: Not on file   Other Topics Concern  . Not on file   Social History Narrative  . No narrative on file   Review of systems: Review of Systems  Constitutional: Negative for fever and chills.  HENT: Negative.   Eyes: Negative for blurred vision.  Respiratory: as per HPI  Cardiovascular: Negative for chest pain and palpitations.  Gastrointestinal: Negative for vomiting, diarrhea, blood per rectum. Genitourinary: Negative for dysuria, urgency, frequency and hematuria.  Musculoskeletal: Negative for myalgias, back pain and joint pain.  Skin: Negative for itching and rash.  Neurological: Negative for dizziness, tremors, focal weakness, seizures and loss of consciousness.  Endo/Heme/Allergies: Negative for environmental allergies.  Psychiatric/Behavioral: Negative for depression, suicidal ideas and hallucinations.  All other systems  reviewed and are negative.  Physical Exam: Blood pressure 102/70, pulse 93, height 5\' 1"  (1.549 m), weight 62.1 kg (136 lb 12.8 oz), SpO2 94 %. Gen:      No acute distress HEENT:  EOMI, sclera anicteric Neck:     No masses; no thyromegaly Lungs:    Clear to auscultation bilaterally; normal respiratory effort CV:         Regular rate and rhythm; no murmurs Abd:      + bowel sounds; soft, non-tender; no palpable masses, no distension Ext:    No edema; adequate peripheral perfusion Skin:      Warm and dry; no rash Neuro: alert and oriented x 3 Psych: normal mood and affect  Data Reviewed: CXR 12/18/15. Mild bronchial wall thickening consistent with bronchitis, no other acute abnormality. Images personally reviewed.   Assessment:  Chronic cough. I suspect her cough is post infectious in nature after an episode of viral respiratory tract infection, bronchitis. She may have a component of upper airway cough syndrome from postnasal drip as well. She denies any GERD symptoms.  She is currently on Zyrtec. I'll change this to first generation antihistamine and give her Flonase nasal spray. I'll also give her an albuterol rescue inhaler to be used as needed. Check FENO to assess if she needs inhaled corticosteroids. I educated her on behavioral changes to deal with cough including conscious suppression of the urge to cough, use of throat lozenges.  Plan/Recommendations: - Check FeNO - Start albuterol rescue inhaler and flonase nasal spray - Change zyrtec to chlorpheniramine 8 mg tid.  Marshell Garfinkel MD [facility] Pager [phone] 01/01/2016, 12:01 PM  CC: Robyne Peers, MD.  Addendum: FENO is 41 indicating airway inflammation. I will start Qvar inhaler.

## 2016-01-01 NOTE — Telephone Encounter (Signed)
Patient notified of Dr. Matilde Bash instructions today and Rx for Flonase and Albuterol sent to pharmacy. Nothing further needed.

## 2016-01-01 NOTE — Patient Instructions (Signed)
We will start you on chlorpheniramine 8 mg tid and flonase nasal spray. We will give albuterol rescue inhaler. Check FeNo  Return in 1 month

## 2016-01-06 ENCOUNTER — Inpatient Hospital Stay: Admit: 2016-01-06 | Payer: Medicare HMO | Admitting: Orthopedic Surgery

## 2016-01-06 SURGERY — ARTHROPLASTY, KNEE, TOTAL
Anesthesia: Choice | Site: Knee | Laterality: Right

## 2016-01-09 ENCOUNTER — Telehealth: Payer: Self-pay | Admitting: Pulmonary Disease

## 2016-01-09 NOTE — Telephone Encounter (Signed)
Received fax from [facility] stating that pt's QVAR 80 (prescribed at the 9.20.17 consult with PM) is not covered by patient's insurance:  "Not covered by INS, will cover Flovent Diskus, Arnuity, Flovent HFA.  Please send alternate Rx if okay.  Thanks."  Per PM: Okay to switch to Flovent HFA BID.  LMOM TCB x1 to discuss with patient.

## 2016-01-09 NOTE — Telephone Encounter (Signed)
ATC pt, line rings busy x 3 WCB 

## 2016-01-09 NOTE — Telephone Encounter (Signed)
Pt returning call, said that it would be ok to leave msg if no answer.Nichole Johnson

## 2016-01-09 NOTE — Telephone Encounter (Signed)
Patient returned call, CB is [phone]

## 2016-01-09 NOTE — Telephone Encounter (Signed)
Called spoke with pt. Aware Qvar is not covered by insurance. We are changing to flovent. Flovent has 3 dosages: 54mcg, 110 mcg, 220 mcg.  Please advise Dr. Vaughan Browner thanks  Pt aware to continue on the QVAR until we call her when we call in new Rx.

## 2016-01-10 MED ORDER — FLUTICASONE PROPIONATE HFA 110 MCG/ACT IN AERO: 2.0000 | INHALATION_SPRAY | Freq: Two times a day (BID) | RESPIRATORY_TRACT | 1 refills | Status: DC

## 2016-01-10 NOTE — Telephone Encounter (Signed)
Spoke with pt and is aware of the change. She request Rx be sent to [facility]. I have done so. Nothing further needed

## 2016-01-10 NOTE — Telephone Encounter (Signed)
Use flovent 110 mcg

## 2016-02-05 ENCOUNTER — Ambulatory Visit: Payer: Medicare HMO | Admitting: Pulmonary Disease

## 2016-05-19 DIAGNOSIS — I5023 Acute on chronic systolic (congestive) heart failure: Secondary | ICD-10-CM

## 2016-05-19 HISTORY — DX: Acute on chronic systolic (congestive) heart failure: I50.23

## 2016-05-27 ENCOUNTER — Telehealth: Payer: Self-pay | Admitting: Pulmonary Disease

## 2016-05-27 NOTE — Telephone Encounter (Signed)
Spoke with pt. States that she was diagnosed with CHF. Pt can no longer take Albuterol due to increasing her heart rate. She will need an alternative.  PM - please advise if it would be okay to prescribe her Xopenex. Thanks.

## 2016-05-28 NOTE — Telephone Encounter (Signed)
lmtcb x1 for pt. 

## 2016-05-28 NOTE — Telephone Encounter (Signed)
OK to prescribe xopenex instead of albuterol  PM

## 2016-05-29 MED ORDER — LEVALBUTEROL TARTRATE 45 MCG/ACT IN AERO: 1.0000 | INHALATION_SPRAY | Freq: Four times a day (QID) | RESPIRATORY_TRACT | 6 refills | Status: DC | PRN

## 2016-05-29 NOTE — Telephone Encounter (Signed)
Called and spoke with pt and she is aware of new rx that has been sent to the pharmacy for the xopenex.

## 2016-06-09 ENCOUNTER — Telehealth: Payer: Self-pay

## 2016-06-09 DIAGNOSIS — I959 Hypotension, unspecified: Secondary | ICD-10-CM

## 2016-06-09 DIAGNOSIS — N289 Disorder of kidney and ureter, unspecified: Secondary | ICD-10-CM

## 2016-06-09 HISTORY — DX: Hypotension, unspecified: I95.9

## 2016-06-09 HISTORY — DX: Disorder of kidney and ureter, unspecified: N28.9

## 2016-06-09 NOTE — Telephone Encounter (Signed)
PA request received from [facility] mail order pharmacy for [facility].   PA initiated through [facility].com Key: [id number] PA has been approved.  Pharmacy aware.  Nothing further needed.

## 2016-06-11 ENCOUNTER — Other Ambulatory Visit: Payer: Self-pay | Admitting: Family Medicine

## 2016-06-11 DIAGNOSIS — Z1231 Encounter for screening mammogram for malignant neoplasm of breast: Secondary | ICD-10-CM

## 2016-07-08 DIAGNOSIS — G4733 Obstructive sleep apnea (adult) (pediatric): Secondary | ICD-10-CM | POA: Insufficient documentation

## 2016-07-08 DIAGNOSIS — I10 Essential (primary) hypertension: Secondary | ICD-10-CM

## 2016-07-08 HISTORY — DX: Obstructive sleep apnea (adult) (pediatric): G47.33

## 2016-07-08 HISTORY — DX: Essential (primary) hypertension: I10

## 2016-07-10 ENCOUNTER — Ambulatory Visit: Payer: Medicare HMO

## 2016-07-27 ENCOUNTER — Ambulatory Visit: Payer: Medicare HMO

## 2016-10-13 ENCOUNTER — Ambulatory Visit: Payer: Medicare HMO

## 2016-12-02 DIAGNOSIS — D239 Other benign neoplasm of skin, unspecified: Secondary | ICD-10-CM

## 2016-12-02 HISTORY — DX: Other benign neoplasm of skin, unspecified: D23.9

## 2017-01-18 DIAGNOSIS — R1013 Epigastric pain: Secondary | ICD-10-CM

## 2017-01-18 HISTORY — DX: Epigastric pain: R10.13

## 2017-06-14 ENCOUNTER — Other Ambulatory Visit: Payer: Self-pay | Admitting: Family Medicine

## 2017-06-14 DIAGNOSIS — Z1231 Encounter for screening mammogram for malignant neoplasm of breast: Secondary | ICD-10-CM

## 2017-06-14 DIAGNOSIS — M8580 Other specified disorders of bone density and structure, unspecified site: Secondary | ICD-10-CM

## 2017-07-02 ENCOUNTER — Ambulatory Visit
Admission: RE | Admit: 2017-07-02 | Discharge: 2017-07-02 | Disposition: A | Discharge status: Home / Self Care | Payer: Medicare HMO | Source: Ambulatory Visit | Attending: Family Medicine | Admitting: Family Medicine

## 2017-07-02 DIAGNOSIS — Z1231 Encounter for screening mammogram for malignant neoplasm of breast: Secondary | ICD-10-CM

## 2017-07-02 DIAGNOSIS — M8580 Other specified disorders of bone density and structure, unspecified site: Secondary | ICD-10-CM

## 2017-07-02 HISTORY — DX: Personal history of irradiation: Z92.3

## 2017-10-12 DIAGNOSIS — Z96651 Presence of right artificial knee joint: Secondary | ICD-10-CM | POA: Insufficient documentation

## 2017-10-12 HISTORY — DX: Presence of right artificial knee joint: Z96.651

## 2017-10-14 DIAGNOSIS — H409 Unspecified glaucoma: Secondary | ICD-10-CM

## 2017-10-14 DIAGNOSIS — J45909 Unspecified asthma, uncomplicated: Secondary | ICD-10-CM | POA: Insufficient documentation

## 2017-10-14 HISTORY — DX: Unspecified asthma, uncomplicated: J45.909

## 2017-10-14 HISTORY — DX: Unspecified glaucoma: H40.9

## 2017-11-03 DIAGNOSIS — N179 Acute kidney failure, unspecified: Secondary | ICD-10-CM

## 2017-11-03 HISTORY — DX: Acute kidney failure, unspecified: N17.9

## 2018-02-16 ENCOUNTER — Other Ambulatory Visit: Payer: Self-pay | Admitting: Family Medicine

## 2018-02-16 DIAGNOSIS — N6320 Unspecified lump in the left breast, unspecified quadrant: Secondary | ICD-10-CM

## 2018-02-17 ENCOUNTER — Ambulatory Visit
Admission: RE | Admit: 2018-02-17 | Discharge: 2018-02-17 | Disposition: A | Discharge status: Home / Self Care | Payer: Medicare HMO | Source: Ambulatory Visit | Attending: Family Medicine | Admitting: Family Medicine

## 2018-02-17 DIAGNOSIS — N6320 Unspecified lump in the left breast, unspecified quadrant: Secondary | ICD-10-CM

## 2018-02-17 HISTORY — DX: Malignant neoplasm of unspecified site of unspecified female breast: C50.919

## 2018-03-11 ENCOUNTER — Inpatient Hospital Stay: Payer: Medicare HMO

## 2018-03-11 ENCOUNTER — Encounter: Payer: Self-pay | Admitting: Hematology & Oncology

## 2018-03-11 ENCOUNTER — Other Ambulatory Visit: Payer: Self-pay

## 2018-03-11 ENCOUNTER — Inpatient Hospital Stay: Payer: Medicare HMO | Attending: Hematology & Oncology | Admitting: Hematology & Oncology

## 2018-03-11 VITALS — BP 115/64 | HR 82 | Temp 98.9°F | Resp 16 | Ht 59.0 in | Wt 134.0 lb

## 2018-03-11 DIAGNOSIS — C50911 Malignant neoplasm of unspecified site of right female breast: Secondary | ICD-10-CM

## 2018-03-11 DIAGNOSIS — D509 Iron deficiency anemia, unspecified: Secondary | ICD-10-CM

## 2018-03-11 DIAGNOSIS — D500 Iron deficiency anemia secondary to blood loss (chronic): Secondary | ICD-10-CM

## 2018-03-11 DIAGNOSIS — Z853 Personal history of malignant neoplasm of breast: Secondary | ICD-10-CM | POA: Diagnosis present

## 2018-03-11 LAB — CBC WITH DIFFERENTIAL (CANCER CENTER ONLY)
Abs Immature Granulocytes: 0.02 10*3/uL (ref 0.00–0.07)
BASOS ABS: 0.1 10*3/uL (ref 0.0–0.1)
BASOS PCT: 2 %
EOS ABS: 0.3 10*3/uL (ref 0.0–0.5)
Eosinophils Relative: 5 %
HCT: 45.2 % (ref 36.0–46.0)
Hemoglobin: 14 g/dL (ref 12.0–15.0)
Immature Granulocytes: 0 %
LYMPHS ABS: 1.6 10*3/uL (ref 0.7–4.0)
Lymphocytes Relative: 31 %
MCH: 26.7 pg (ref 26.0–34.0)
MCHC: 31 g/dL (ref 30.0–36.0)
MCV: 86.3 fL (ref 80.0–100.0)
Monocytes Absolute: 0.7 10*3/uL (ref 0.1–1.0)
Monocytes Relative: 13 %
NEUTROS PCT: 49 %
NRBC: 0 % (ref 0.0–0.2)
Neutro Abs: 2.5 10*3/uL (ref 1.7–7.7)
PLATELETS: 254 10*3/uL (ref 150–400)
RBC: 5.24 MIL/uL — AB (ref 3.87–5.11)
RDW: 13.4 % (ref 11.5–15.5)
WBC: 5.1 10*3/uL (ref 4.0–10.5)

## 2018-03-11 LAB — CMP (CANCER CENTER ONLY)
ALT: 17 U/L (ref 0–44)
ANION GAP: 9 (ref 5–15)
AST: 16 U/L (ref 15–41)
Albumin: 4.5 g/dL (ref 3.5–5.0)
Alkaline Phosphatase: 59 U/L (ref 38–126)
BUN: 14 mg/dL (ref 8–23)
CALCIUM: 9.8 mg/dL (ref 8.9–10.3)
CHLORIDE: 100 mmol/L (ref 98–111)
CO2: 30 mmol/L (ref 22–32)
Creatinine: 0.94 mg/dL (ref 0.44–1.00)
GFR, Estimated: 60 mL/min — ABNORMAL LOW (ref >60)
Glucose, Bld: 104 mg/dL — ABNORMAL HIGH (ref 70–99)
Potassium: 3.8 mmol/L (ref 3.5–5.1)
SODIUM: 139 mmol/L (ref 135–145)
TOTAL PROTEIN: 7.2 g/dL (ref 6.5–8.1)
Total Bilirubin: 0.8 mg/dL (ref 0.3–1.2)

## 2018-03-11 LAB — IRON AND TIBC
Iron: 75 ug/dL (ref 41–142)
SATURATION RATIOS: 21 % (ref 21–57)
TIBC: 364 ug/dL (ref 236–444)
UIBC: 289 ug/dL (ref 120–384)

## 2018-03-11 LAB — RETICULOCYTES
Immature Retic Fract: 13.8 % (ref 2.3–15.9)
RBC.: 5.24 MIL/uL — AB (ref 3.87–5.11)
Retic Count, Absolute: 80.7 10*3/uL (ref 19.0–186.0)
Retic Ct Pct: 1.5 % (ref 0.4–3.1)

## 2018-03-11 LAB — FERRITIN: FERRITIN: 42 ng/mL (ref 11–307)

## 2018-03-11 NOTE — Progress Notes (Signed)
Hematology and Oncology Follow Up Visit  Nichole Johnson [id number] 1946/03/13 72 y.o. 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  It has been for years we last saw her.  She is been doing okay although she now has congestive heart failure.  She is was admitted overnight to [facility] either year or so ago.  She was found to have an ejection fraction of 35-40%.  She was told that this was from chemotherapy that she had back when she had a breast cancer.  I am trying to find what kind of chemotherapy she actually had.  She is doing well from my point of view.  She has had no bleeding.  She has had no cough or shortness of breath.  She and her husband will be going around the tip of [place] in January.  They will be gone for 15 days.  I am sure that they will have a good time.  She has had no change in bowel or bladder habits.  She is had no rashes.  She currently is on metoprolol for her congestive heart failure.  She also is on Zocor.  She thought that she felt a lump in the left breast when she was checking herself.  This really is what prompted her to come and see [place].  She has had a mammogram just 3 weeks ago.  Everything looked fine on the mammogram.    Overall, her performance status is ECOG 1.  Medications:  Current Outpatient Medications:  .  dorzolamide (TRUSOPT) 2 % ophthalmic solution, dorzolamide 2 % eye drops, Disp: , Rfl:  .  levothyroxine (SYNTHROID, LEVOTHROID) 100 MCG tablet, Take 100 mcg by mouth daily., Disp: , Rfl:  .  lisinopril (PRINIVIL,ZESTRIL) 2.5 MG tablet, Take 2.5 mg by mouth daily., Disp: , Rfl:  .  psyllium (METAMUCIL) 58.6 % packet, Take 1 packet by mouth daily., Disp: , Rfl:  .  torsemide (DEMADEX) 10 MG tablet, Take 10 mg  by mouth daily., Disp: , Rfl:  .  albuterol (PROVENTIL HFA;VENTOLIN HFA) 108 (90 Base) MCG/ACT inhaler, Inhale 2 puffs into the lungs every 6 (six) hours as needed for wheezing or shortness of breath., Disp: 1 Inhaler, Rfl: 6 .  alendronate (FOSAMAX) 5 MG tablet, Take 5 mg by mouth daily before breakfast. Take with a full glass of water on an empty stomach., Disp: , Rfl:  .  Aspirin (ASPIR-81 PO), Take by mouth every morning., Disp: , Rfl:  .  beclomethasone (QVAR) 80 MCG/ACT inhaler, Inhale 2 puffs into the lungs 2 (two) times daily., Disp: 1 Inhaler, Rfl: 6 .  brinzolamide (AZOPT) 1 % ophthalmic suspension, Place 1 drop into both eyes 2 (two) times daily., Disp: , Rfl:  .  Calcium Carbonate-Vitamin D (CALCIUM + D PO), Take by mouth 2 (two) times daily., Disp: , Rfl:  .  Cholecalciferol (VITAMIN D-1000 MAX ST) 25 MCG (1000 UT) tablet, Take by mouth., Disp: , Rfl:  .  ferrous sulfate 325 (65 FE) MG tablet, Take 325 mg by mouth daily., Disp: , Rfl:  .  Fish Oil OIL, Take by mouth 2 (two) times daily., Disp: , Rfl:  .  fluticasone (FLONASE) 50 MCG/ACT nasal spray, Place 2 sprays into both nostrils daily.,  Disp: 16 g, Rfl: 2 .  fluticasone (FLOVENT HFA) 110 MCG/ACT inhaler, Inhale 2 puffs into the lungs 2 (two) times daily., Disp: 3 Inhaler, Rfl: 1 .  levalbuterol (XOPENEX HFA) 45 MCG/ACT inhaler, Inhale 1-2 puffs into the lungs every 6 (six) hours as needed for wheezing., Disp: 1 Inhaler, Rfl: 6 .  Loratadine (CLARITIN) 10 MG CAPS, Take 10 mg by mouth daily., Disp: , Rfl:  .  metoprolol succinate (TOPROL-XL) 50 MG 24 hr tablet, Take 50 mg by mouth daily. Take with or immediately following a meal., Disp: , Rfl:  .  Multiple Vitamins-Minerals (CENTRUM SILVER PO), Take by mouth every morning., Disp: , Rfl:  .  Omega-3 Fatty Acids (FISH OIL) 1000 MG CAPS, Take by mouth., Disp: , Rfl:  .  simvastatin (ZOCOR) 10 MG tablet, Take 10 mg by mouth at bedtime., Disp: , Rfl:  .  Travoprost, BAK Free, (TRAVATAN Z)  0.004 % SOLN ophthalmic solution, Place 1 drop into both eyes at bedtime., Disp: , Rfl:  .  vitamin C (ASCORBIC ACID) 500 MG tablet, Take 500 mg by mouth daily., Disp: , Rfl:   Allergies:  Allergies  Allergen Reactions  . Sulfamethoxazole-Trimethoprim Other (See Comments)    Makes her shaky and sweat  . Tramadol Other (See Comments)    Makes her shaky and sweat  . Penicillins Itching and Other (See Comments)    Vulvovaginal Candidiasis with associated pruritis Vaginal Infections Vaginal Infections     Past Medical History, Surgical history, Social history, and Family History were reviewed and updated.  Review of Systems: Review of Systems  Constitutional: Negative.   HENT:  Negative.   Eyes: Negative.   Respiratory: Negative.   Cardiovascular: Negative.   Gastrointestinal: Negative.   Endocrine: Negative.   Genitourinary: Negative.    Musculoskeletal: Negative.   Skin: Negative.   Neurological: Negative.   Hematological: Negative.   Psychiatric/Behavioral: Negative.     Physical Exam:  height is 4\' 11"  (1.499 m) and weight is 134 lb (60.8 kg). Her oral temperature is 98.9 F (37.2 C). Her blood pressure is 115/64 and her pulse is 82. Her respiration is 16 and oxygen saturation is 98%.   Wt Readings from Last 3 Encounters:  03/11/18 134 lb (60.8 kg)  01/01/16 136 lb 12.8 oz (62.1 kg)  02/23/14 142 lb (64.4 kg)    Physical Exam  Constitutional: She is oriented to person, place, and time.  Examination of her breasts show right breast with no masses, edema or erythema.  There is no right axillary adenopathy.  Left breast shows well-healed lumpectomy at the 12 o'clock position.  She has some slight contraction of the left breast from surgery and radiation.  No distinct mass noted in the left breast.  There is no left axillary adenopathy.  HENT:  Head: Normocephalic and atraumatic.  Mouth/Throat: Oropharynx is clear and moist.  Eyes: Pupils are equal, round, and reactive  to light. EOM are normal.  Neck: Normal range of motion.  Cardiovascular: Normal rate, regular rhythm and normal heart sounds.  Pulmonary/Chest: Effort normal and breath sounds normal.  Abdominal: Soft. Bowel sounds are normal.  Musculoskeletal: Normal range of motion. She exhibits no edema, tenderness or deformity.  Lymphadenopathy:    She has no cervical adenopathy.  Neurological: She is alert and oriented to person, place, and time.  Skin: Skin is warm and dry. No rash noted. No erythema.  Psychiatric: She has a normal mood and affect. Her behavior is normal. Judgment and  thought content normal.  Vitals reviewed.    Lab Results  Component Value Date   WBC 5.1 03/11/2018   HGB 14.0 03/11/2018   HCT 45.2 03/11/2018   MCV 86.3 03/11/2018   PLT 254 03/11/2018     Chemistry      Component Value Date/Time   NA 139 03/11/2018 0921   K 3.8 03/11/2018 0921   CL 100 03/11/2018 0921   CO2 30 03/11/2018 0921   BUN 14 03/11/2018 0921   CREATININE 0.94 03/11/2018 0921      Component Value Date/Time   CALCIUM 9.8 03/11/2018 0921   ALKPHOS 59 03/11/2018 0921   AST 16 03/11/2018 0921   ALT 17 03/11/2018 0921   BILITOT 0.8 03/11/2018 0921       Impression and Plan: Ms. Minnifield is a 72 year old [place] female.  She has not been seen for 4 years.  Before that, I think she had been seen for 5 years prior.  Her breast cancer was in 2001.  I am not sure what the chemotherapy she was taking for this.  She was on tamoxifen for 5 years.  Hopefully, the congestive heart failure is not could be an issue for her.  From my perspective, I think she is done incredibly well with this.  We will plan to get her back in 1 year.  Hopefully, she will bring me back pictures of her trip around the tip of [place].   [name], MD 11/29/201911:02 AM

## 2018-03-12 LAB — CANCER ANTIGEN 27.29: CAN 27.29: 38.2 U/mL (ref 0.0–38.6)

## 2018-03-14 ENCOUNTER — Telehealth: Payer: Self-pay | Admitting: *Deleted

## 2018-03-14 NOTE — Telephone Encounter (Signed)
As noted below by Dr. Ennever, I informed the patient that her iron level is OK. She verbalized understanding. °

## 2018-03-14 NOTE — Telephone Encounter (Signed)
-----   Message from [name], MD sent at 03/11/2018 12:39 PM EST ----- Call - iron level is ok!!  [name]

## 2018-04-07 ENCOUNTER — Ambulatory Visit: Payer: Medicare HMO | Admitting: Cardiology

## 2018-04-10 DIAGNOSIS — T451X5A Adverse effect of antineoplastic and immunosuppressive drugs, initial encounter: Secondary | ICD-10-CM

## 2018-04-10 DIAGNOSIS — I427 Cardiomyopathy due to drug and external agent: Secondary | ICD-10-CM

## 2018-04-10 DIAGNOSIS — I110 Hypertensive heart disease with heart failure: Secondary | ICD-10-CM

## 2018-04-10 DIAGNOSIS — I420 Dilated cardiomyopathy: Secondary | ICD-10-CM | POA: Insufficient documentation

## 2018-04-10 HISTORY — DX: Adverse effect of antineoplastic and immunosuppressive drugs, initial encounter: T45.1X5A

## 2018-04-10 HISTORY — DX: Dilated cardiomyopathy: I42.0

## 2018-04-10 HISTORY — DX: Cardiomyopathy due to drug and external agent: I42.7

## 2018-04-10 HISTORY — DX: Hypertensive heart disease with heart failure: I11.0

## 2018-04-10 NOTE — Progress Notes (Signed)
Cardiology Office Note:    Date:  04/11/2018   ID:  Nichole Johnson, DOB Jan 06, 1946, MRN [id number]  PCP:  Robyne Peers, MD  Cardiologist:  Shirlee More, MD    Referring MD: Kathlen Brunswick., MD    ASSESSMENT:    1. Dilated cardiomyopathy ([place])   2. Chronic systolic heart failure ([place])   3. Hypertensive heart disease with heart failure ([place])   4. 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 Hyperlipidemia, unspecified hyperlipidemia type    PLAN:    In order of problems listed above:  1. 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  I discussed the natural history however tends to progress asked her to continue her current medical treatment including beta-blocker encouraged her to consider transition from lisinopril to [facility] she declined discussed the potential of adding MRA she declined.  We also discussed the role of ICD therapy and she is already been seen by an EP physician made informed decision at this time not to have an ICD prophylactically we discussed wearing the event monitor for 1 to 2 weeks to screen for VT and in the absence of syncope or symptoms of rapid heart rhythm she declines she will continue her current medical regimen sodium restriction full activity and exercise I will see back in the office 6 months or sooner.  I did ask her to have a proBNP done with her next labs with her PCP.  November 2019 renal function was normal.  Other labs noted in this record 2. Stable compensated New York Heart Association class I continue current treatment see above consider repeat echocardiogram next visit 3. Stable borderline blood pressure I told the patient and her husband systolics of 0160 and appropriate she is asymptomatic continue guideline directed medical therapy for severe cardiomyopathy 4. Also asked me my opinion regarding total knee arthroplasty I think she is an  appropriate candidate and I would withdraw her ACE inhibitor for several days prior to surgery and hold her beta-blocker the day of surgery to avoid intraoperative hypotension.   Next appointment: 6 months   Medication Adjustments/Labs and Tests Ordered: Current medicines are reviewed at length with the patient today.  Concerns regarding medicines are outlined above.  No orders of the defined types were placed in this encounter.  No orders of the defined types were placed in this encounter.   Chief Complaint  Patient presents with  . Congestive Heart Failure  . Cardiomyopathy    History of Present Illness:    Nichole Johnson is a 72 y.o. female with a hx of cardiomyopathy ejection fraction 28% severe global left ventricular hypokinesia 05/24/2017 at [facility] cardiology previously cared for by Dr.Kalil.  She is here today to establish cardiology care . Compliance with diet, lifestyle and medications: yes  Previous evaluation at [facility] cardiac MRI 08/12/2016 showed a calculated ejection fraction of 26% dilated left ventricle 6.2 cm mild to moderate mitral regurgitation. m that report says that she has had a history of chemotherapy including Adriamycin along with surgery and radiation therapy for breast cancer with a history of heart failure occurring 14 years prior to the cardiac MRI.  He was seen at [facility] by Dr. Tammi Klippel regarding her cardiomyopathy 08/11/2016.  Because of hypotension he did not initiate spironolactone or Entresto.  I had the honor caring for her husband more than a decade ago and they recognize me.  Despite her history  of low ejection fraction and previous heart failure she is New York Heart Association class I exercises on a regular basis has no exercise intolerance dyspnea shortness of breath chest pain or palpitation.  She has been seen by EP in the past and declined an ICD and after discussion of treatment does not wish to take [facility] because of concerns I have  with side effects of newer drugs.  Her last proBNP level was close to 2 years ago and they prefer to have it rechecked with her primary care physician.  They tell me that her previous cardiologist told her she could not have a total knee replacement her heart failure is well compensated I think she is a stable candidate for total knee arthroplasty provided we withdraw her lisinopril 2 to 3 days prior to the procedure to avoid perioperative hypotension avoid spinal anesthetics.  She has not had palpitation or syncope and declines to wear a event monitor Past Medical History:  Diagnosis Date  . Acute on chronic systolic congestive heart failure ([place]) 05/19/2016  . Acute renal insufficiency 06/09/2016  . AKI (acute kidney injury) ([place]) 11/03/2017  . Anemia 04/18/2014  . Benign hypertension 07/08/2016  . Breast cancer ([place]) 2001   Left Breast Cancer  . Cancer ([place])   . 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 Dilated cardiomyopathy ([place]) 04/10/2018  . Dyspepsia 01/18/2017  . Dysplastic nevus 12/02/2016  . Glaucoma 10/14/2017  . High cholesterol   . Hyperlipidemia 04/18/2014  . Hypertension   . Hypertensive heart disease with heart failure ([place]) 04/10/2018  . Hypotension 06/09/2016  . Hypothyroidism 04/18/2014  . Irritable bowel syndrome without diarrhea 12/10/2014  . Mild diastolic dysfunction 08/19/5275   Overview:  Last Assessment & Plan:  Pt will f/u w/ Cardiology re this. Pt was given copy of recent ECHO report.  Last Assessment & Plan:  Pt will f/u w/ Cardiology re this. Pt was given copy of recent ECHO report.   . Obstructive sleep apnea (adult) (pediatric) 07/08/2016  . Personal history of radiation therapy 2001   Left Breast Cancer  . Reactive airway disease without complication 11/12/4233    Past Surgical History:  Procedure Laterality Date  . BREAST LUMPECTOMY Left 2001  . CESAREAN SECTION    . ROTATOR CUFF REPAIR Bilateral   . SHOULDER CLOSED REDUCTION      Current  Medications: No outpatient medications have been marked as taking for the 04/11/18 encounter (Office Visit) with Richardo Priest, MD.     Allergies:   Sulfamethoxazole-trimethoprim; Tramadol; and Penicillins   Social History   Socioeconomic History  . Marital status: Married    Spouse name: Not on file  . Number of children: Not on file  . Years of education: Not on file  . Highest education level: Not on file  Occupational History  . Not on file  Social Needs  . Financial resource strain: Not on file  . Food insecurity:    Worry: Not on file    Inability: Not on file  . Transportation needs:    Medical: Not on file    Non-medical: Not on file  Tobacco Use  . Smoking status: Never Smoker  . Smokeless tobacco: Never Used  . Tobacco comment: NEVER USED TOBACCO  Substance and Sexual Activity  . Alcohol use: Not on file    Comment: daily  . Drug use: No  . Sexual activity: Not on file  Lifestyle  . Physical activity:    Days per  week: Not on file    Minutes per session: Not on file  . Stress: Not on file  Relationships  . Social connections:    Talks on phone: Not on file    Gets together: Not on file    Attends religious service: Not on file    Active member of club or organization: Not on file    Attends meetings of clubs or organizations: Not on file    Relationship status: Not on file  Other Topics Concern  . Not on file  Social History Narrative  . Not on file     Family History: The patient's family history includes Hypertension in her mother. There is no history of Heart attack, Stroke, or Diabetes. ROS:  Review of Systems  Constitution: Positive for malaise/fatigue.  HENT: Negative.   Eyes: Negative.   Cardiovascular: Negative.   Respiratory: Negative.   Endocrine: Negative.   Hematologic/Lymphatic: Negative.   Skin: Negative.   Musculoskeletal: Negative.   Gastrointestinal: Negative.   Genitourinary: Negative.   Neurological: Positive for  numbness (hands CTS).  Psychiatric/Behavioral: Negative.   Allergic/Immunologic: Negative.    Please see the history of present illness.    All other systems reviewed and are negative.  EKGs/Labs/Other Studies Reviewed:    The following studies were reviewed today:  EKG:  EKG ordered today.  The ekg ordered today demonstrates [place]:   01/20/2018 TSH was normal 1.3 cholesterol 176 HDL 54 LDL 98 creatinine 0.9 GFR 62 cc potassium 4.2 CBC 10/18/2017 hemoglobin 15.5 Latest proBNP level performed 07/17/2016 elevated 1585  03/11/2018: ALT 17; BUN 14; Creatinine 0.94; Hemoglobin 14.0; Platelet Count 254; Potassium 3.8; Sodium 139    Physical Exam:    VS:  BP 90/68 (BP Location: Left Arm, Patient Position: Sitting, Cuff Size: Normal)   Pulse 86   Ht 4\' 11"  (1.499 m)   Wt 135 lb 12.8 oz (61.6 kg)   SpO2 97%   BMI 27.43 kg/m     Wt Readings from Last 3 Encounters:  04/11/18 135 lb 12.8 oz (61.6 kg)  03/11/18 134 lb (60.8 kg)  01/01/16 136 lb 12.8 oz (62.1 kg)     GEN:  Well nourished, well developed in no acute distress HEENT: Normal NECK: No JVD; No carotid bruits LYMPHATICS: No lymphadenopathy CARDIAC: soft s1RRR, no murmurs, rubs, gallops RESPIRATORY:  Clear to auscultation without rales, wheezing or rhonchi  ABDOMEN: Soft, non-tender, non-distended MUSCULOSKELETAL:  No edema; No deformity  SKIN: Warm and dry NEUROLOGIC:  Alert and oriented x 3 PSYCHIATRIC:  Normal affect    Signed, Shirlee More, MD  04/11/2018 10:36 AM    [place]

## 2018-04-11 ENCOUNTER — Ambulatory Visit (INDEPENDENT_AMBULATORY_CARE_PROVIDER_SITE_OTHER): Payer: Medicare HMO | Admitting: Cardiology

## 2018-04-11 DIAGNOSIS — I110 Hypertensive heart disease with heart failure: Secondary | ICD-10-CM | POA: Diagnosis not present

## 2018-04-11 DIAGNOSIS — I5022 Chronic systolic (congestive) heart failure: Secondary | ICD-10-CM

## 2018-04-11 DIAGNOSIS — I427 Cardiomyopathy due to drug and external agent: Secondary | ICD-10-CM

## 2018-04-11 DIAGNOSIS — T451X5A Adverse effect of antineoplastic and immunosuppressive drugs, initial encounter: Secondary | ICD-10-CM

## 2018-04-11 DIAGNOSIS — I420 Dilated cardiomyopathy: Secondary | ICD-10-CM

## 2018-04-11 DIAGNOSIS — E785 Hyperlipidemia, unspecified: Secondary | ICD-10-CM

## 2018-04-11 NOTE — Patient Instructions (Signed)

## 2018-04-19 ENCOUNTER — Telehealth (HOSPITAL_COMMUNITY): Payer: Self-pay | Admitting: Cardiology

## 2018-04-19 NOTE — Telephone Encounter (Signed)
Called and left message for patient to call back.  Need to give patient New CHF appt info.

## 2018-04-21 NOTE — Telephone Encounter (Signed)
Spoke with patient's spouse who states that he is not sure she still needs an appt with our Clinic.  He states pt's Cardiologist is retiring and she is now seeing Dr. Bettina Gavia.  He thinks the Referral was sent by patient's PCP prior to her establishing with Dr. Bettina Gavia.  However, pt is out of town until April 27, 2018.  Her spouse stated he will have her contact our office when she returns regarding keeping or declining our appt.

## 2018-04-28 NOTE — Telephone Encounter (Signed)
Left message for patient to call back  

## 2018-05-09 NOTE — Telephone Encounter (Signed)
Called and spoke with patient.  She states that PCP sent the Referral when her cardiologist was retiring.  Pt stated she is now seeing Dr. Bettina Gavia and does not feel she needs to be seen in our Clinic.  She is aware if Dr. Bettina Gavia feels she needs to come to our Clinic to have him send [place] a Referral.  Appt cancelled and Carole Binning, RN notified.

## 2018-06-06 ENCOUNTER — Encounter (HOSPITAL_COMMUNITY): Payer: Medicare HMO | Admitting: Cardiology

## 2018-07-26 DIAGNOSIS — M79641 Pain in right hand: Secondary | ICD-10-CM

## 2018-07-26 DIAGNOSIS — M79642 Pain in left hand: Secondary | ICD-10-CM | POA: Insufficient documentation

## 2018-07-26 DIAGNOSIS — M6530 Trigger finger, unspecified finger: Secondary | ICD-10-CM | POA: Insufficient documentation

## 2018-07-26 HISTORY — DX: Pain in left hand: M79.642

## 2018-07-26 HISTORY — DX: Trigger finger, unspecified finger: M65.30

## 2018-07-26 HISTORY — DX: Pain in left hand: M79.641

## 2018-10-26 ENCOUNTER — Other Ambulatory Visit: Payer: Self-pay | Admitting: Cardiology

## 2018-10-26 NOTE — Telephone Encounter (Signed)
°*  STAT* If patient is at the pharmacy, call can be transferred to refill team.   1. Which medications need to be refilled? (please list name of each medication and dose if known) lisinopril (PRINIVIL,ZESTRIL) 2.5 MG    2. Which pharmacy/location (including street and city if local pharmacy) is medication to be sent to?  [place], [place] [phone] (Phone) [phone] (Fax)   3. Do they need a 30 day or 90 day supply? 90 day

## 2018-10-27 MED ORDER — LISINOPRIL 2.5 MG PO TABS: 2.5000 mg | ORAL_TABLET | Freq: Every day | ORAL | 0 refills | Status: DC

## 2018-10-27 NOTE — Telephone Encounter (Signed)
Rx for lisinopril 2.5 mg one tablet daily sent to pharmacy as requested.

## 2019-02-01 NOTE — Progress Notes (Signed)
Cardiology Office Note:    Date:  02/02/2019   ID:  Nichole Johnson, DOB 11-05-1945, MRN [id number]  PCP:  Nichole Peers, MD  Cardiologist:  Nichole More, MD    Referring MD: Nichole Peers, MD    ASSESSMENT:    1. Dilated cardiomyopathy ([place])   2. Chronic systolic heart failure ([place])   3. Hypertensive heart disease with heart failure ([place])   4. Hyperlipidemia, unspecified hyperlipidemia type    PLAN:    In order of problems listed above:  1. After discussion she agrees to transition ACE inhibitor to Nichole Johnson with close observation of blood pressure heart rate weight follow-up in 4 weeks consider SGLT2. 2. Heart failure is compensated continue her current loop diuretic 3. Stable hypertension we will start off slowly with Entresto with soft blood pressure today 4. Continue her statin   Next appointment: 4 weeks   Medication Adjustments/Labs and Tests Ordered: Current medicines are reviewed at length with the patient today.  Concerns regarding medicines are outlined above.  No orders of the defined types were placed in this encounter.  No orders of the defined types were placed in this encounter.   Chief Complaint  Patient presents with   Follow-up   Cardiomyopathy    History of Present Illness:    Nichole Johnson is a 73 y.o. female with a hx of cardiomyopathy ejection fraction 28% severe global left ventricular hypokinesia 05/24/2017 at [facility] cardiology previously cared for by Nichole Johnson. last seen 04/11/2018. Previous evaluation at [facility] cardiac MRI 08/12/2016 showed a calculated ejection fraction of 26% dilated left ventricle 6.2 cm mild to moderate mitral regurgitation. m that report says that she has had a history of chemotherapy including Adriamycin along with surgery and radiation therapy for breast cancer with a history of heart failure occurring 14 years prior to the cardiac MRI.  He was seen at [facility] by Nichole Johnson regarding her cardiomyopathy  08/11/2016.  Because of hypotension he did not initiate spironolactone or Entresto.She declined ARNI/ MRA and ICD last visit.  Compliance with diet, lifestyle and medications: Yes  Recently she is bothered by arthritis has seen rheumatology declined methotrexate and will start taking over-the-counter Aleve.  I asked her to weigh daily contact me if her weight goes up 5 pounds.  She is very concerned about her cardiomyopathy but is also hesitant except treatments and after a nice discussion agrees to start [facility].  We will start off is 1 a day check home blood pressures for 2 weeks and greater in the 100 go to twice daily have her come back in 1 month to follow-up in the office nurse practitioner she will need labs including proBNP and see if we can up titrate or add SGLT2 inhibitor.  She fatigues easily but has no edema orthopnea shortness of breath chest pain palpitation or syncope. 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 Pt was given copy of recent ECHO report.  Last Assessment & Plan:  Pt will f/u w/ Cardiology re this. Pt was given copy of recent ECHO report.    Obstructive sleep apnea (adult) (pediatric)  07/08/2016   Personal history of radiation therapy 2001   Left Breast Cancer   Reactive airway disease without complication [id number]    Past Surgical History:  Procedure Laterality Date   BREAST LUMPECTOMY Left 2001   CESAREAN SECTION     ROTATOR CUFF REPAIR Bilateral    SHOULDER CLOSED REDUCTION      Current Medications: Current Meds  Medication Sig   albuterol (PROVENTIL HFA;VENTOLIN HFA) 108 (90 Base) MCG/ACT inhaler Inhale 2 puffs into the lungs every 6 (six) hours as needed for wheezing or shortness of breath.   alendronate (FOSAMAX) 5 MG tablet Take 5 mg by mouth once a week. Take with a full glass of water on an empty stomach.    Aspirin (ASPIR-81 PO) Take by mouth every morning.   brinzolamide (AZOPT) 1 % ophthalmic suspension Place 1 drop into both eyes 2 (two) times daily.   Calcium Carbonate-Vitamin D (CALCIUM + D PO) Take 1 tablet by mouth 2 (two) times daily.    Cholecalciferol (VITAMIN D-1000 MAX ST) 25 MCG (1000 UT) tablet Take 1,000 Units by mouth daily.    ferrous sulfate 325 (65 FE) MG tablet Take 325 mg by mouth daily.   Fish Oil OIL Take 1 tablet by mouth 2 (two) times daily.    fluticasone (FLONASE) 50 MCG/ACT nasal spray Place 2 sprays into both nostrils daily.   fluticasone (FLOVENT HFA) 110 MCG/ACT inhaler Inhale 2 puffs into the lungs 2 (two) times daily.   levalbuterol (XOPENEX HFA) 45 MCG/ACT inhaler Inhale 1-2 puffs into the lungs every 6 (six) hours as needed for wheezing.   levothyroxine (SYNTHROID, LEVOTHROID) 100 MCG tablet Take 100 mcg by mouth daily.   lisinopril (ZESTRIL) 2.5 MG tablet Take 1 tablet (2.5 mg total) by mouth daily. Patient needs follow up appointment for future refills.  Please call to schedule.   Loratadine (CLARITIN) 10 MG CAPS Take 10 mg by mouth daily.   metoprolol succinate (TOPROL-XL) 50 MG 24 hr tablet Take 50 mg by mouth daily. Take with or immediately following a meal.   Multiple Vitamins-Minerals (CENTRUM  SILVER PO) Take by mouth every morning.   psyllium (METAMUCIL) 58.6 % packet Take 1 packet by mouth daily.   simvastatin (ZOCOR) 10 MG tablet Take 10 mg by mouth at bedtime.   torsemide (DEMADEX) 10 MG tablet Take 10 mg by mouth daily.   Travoprost, BAK Free, (TRAVATAN Z) 0.004 % SOLN ophthalmic solution Place 1 drop into both eyes at bedtime.   vitamin C (ASCORBIC ACID) 500 MG tablet Take 500 mg by mouth daily.     Allergies:   Sulfamethoxazole-trimethoprim, Tramadol, and Penicillins   Social History   Socioeconomic History   Marital status: Married    Spouse name: Not on file   Number of children: Not on file   Years of education: Not on file   Highest education level: Not on file  Occupational History   Not on file  Social Needs   Financial resource strain: Not on file   Food insecurity    Worry: Not on file  Inability: Not on file   Transportation needs    Medical: Not on file    Non-medical: Not on file  Tobacco Use   Smoking status: Never Smoker   Smokeless tobacco: Never Used   Tobacco comment: NEVER USED TOBACCO  Substance and Sexual Activity   Alcohol use: Yes    Alcohol/week: 0.0 standard drinks    Comment: occ   Drug use: No   Sexual activity: Not on file  Lifestyle   Physical activity    Days per week: Not on file    Minutes per session: Not on file   Stress: Not on file  Relationships   Social connections    Talks on phone: Not on file    Gets together: Not on file    Attends religious service: Not on file    Active member of club or organization: Not on file    Attends meetings of clubs or organizations: Not on file    Relationship status: Not on file  Other Topics Concern   Not on file  Social History Narrative   Not on file     Family History: The patient's family history includes Hypertension in her mother. There is no history of Heart attack, Stroke, or Diabetes. ROS:   Please see the history of present illness.     All other systems reviewed and are negative.  EKGs/Labs/Other Studies Reviewed:    The following studies were reviewed today:  EKG:  EKG ordered today and personally reviewed.  The ekg ordered today demonstrates shows sinus rhythm left axis deviation right atrial abnormality poor R wave progression due to axis  Recent Labs:  01/18/2019 potassium 4.2 creatinine 0.92 GFR 61 cc TSH normal globin 14.2 03/11/2018: ALT 17; BUN 14; Creatinine 0.94; Hemoglobin 14.0; Platelet Count 254; Potassium 3.8; Sodium 139  Recent Lipid Panel No results found for: CHOL, TRIG, HDL, CHOLHDL, VLDL, LDLCALC, LDLDIRECT  Physical Exam:    VS:  BP 104/78 (BP Location: Right Arm, Patient Position: Sitting, Cuff Size: Normal)    Pulse 84    Ht 4\' 11"  (1.499 m)    Wt 133 lb 6.4 oz (60.5 kg)    SpO2 98%    BMI 26.94 kg/m     Wt Readings from Last 3 Encounters:  02/02/19 133 lb 6.4 oz (60.5 kg)  04/11/18 135 lb 12.8 oz (61.6 kg)  03/11/18 134 lb (60.8 kg)     GEN: Does not look chronically ill or sick well nourished, well developed in no acute distress HEENT: Normal NECK: No JVD; No carotid bruits LYMPHATICS: No lymphadenopathy CARDIAC: RRR, no murmurs, rubs, gallops RESPIRATORY:  Clear to auscultation without rales, wheezing or rhonchi  ABDOMEN: Soft, non-tender, non-distended MUSCULOSKELETAL:  No edema; No deformity  SKIN: Warm and dry NEUROLOGIC:  Alert and oriented x 3 PSYCHIATRIC:  Normal affect    Signed, Nichole More, MD  02/02/2019 11:18 AM    Mila Doce

## 2019-02-02 ENCOUNTER — Ambulatory Visit (INDEPENDENT_AMBULATORY_CARE_PROVIDER_SITE_OTHER): Payer: Medicare HMO | Admitting: Cardiology

## 2019-02-02 ENCOUNTER — Other Ambulatory Visit: Payer: Self-pay

## 2019-02-02 ENCOUNTER — Encounter: Payer: Self-pay | Admitting: Cardiology

## 2019-02-02 VITALS — BP 104/78 | HR 84 | Ht 59.0 in | Wt 133.4 lb

## 2019-02-02 DIAGNOSIS — E785 Hyperlipidemia, unspecified: Secondary | ICD-10-CM | POA: Diagnosis not present

## 2019-02-02 DIAGNOSIS — I5022 Chronic systolic (congestive) heart failure: Secondary | ICD-10-CM | POA: Diagnosis not present

## 2019-02-02 DIAGNOSIS — I110 Hypertensive heart disease with heart failure: Secondary | ICD-10-CM | POA: Diagnosis not present

## 2019-02-02 DIAGNOSIS — I420 Dilated cardiomyopathy: Secondary | ICD-10-CM | POA: Diagnosis not present

## 2019-02-02 MED ORDER — SACUBITRIL-VALSARTAN 24-26 MG PO TABS: 1.0000 | ORAL_TABLET | Freq: Two times a day (BID) | ORAL | 1 refills | Status: DC

## 2019-02-02 NOTE — Patient Instructions (Signed)
Medication Instructions:  Your physician has recommended you make the following change in your medication:   STOP lisinopril for 3 FULL DAYS then... START sacubitril-valsartan (entresto) 24-26 mg: Take 1 tablet daily for 2 weeks then take 1 tablet twice daily   *If you need a refill on your cardiac medications before your next appointment, please call your pharmacy*  Lab Work: None  If you have labs (blood work) drawn today and your tests are completely normal, you will receive your results only by: Marland Kitchen MyChart Message (if you have MyChart) OR . A paper copy in the mail If you have any lab test that is abnormal or we need to change your treatment, we will call you to review the results.  Testing/Procedures: You had an EKG today.   Follow-Up: At [facility], you and your health needs are our priority.  As part of our continuing mission to provide you with exceptional heart care, we have created designated Provider Care Teams.  These Care Teams include your primary Cardiologist (physician) and Advanced Practice Providers (APPs -  Physician Assistants and Nurse Practitioners) who all work together to provide you with the care you need, when you need it.  Your next appointment:   1 month  The format for your next appointment:   In Person  Provider:   Shirlee More, MD  Other Instructions **Check your blood pressure and weight daily at the same time every day and record these readings. Please bring your BP and weight log with you to your next appointment for Dr. Bettina Gavia to review.    Sacubitril; Valsartan oral tablet What is this medicine? SACUBITRIL; VALSARTAN (sak UE bi tril; val SAR tan) is a combination of 2 drugs used to reduce the risk of death and hospitalizations in people with long-lasting heart failure. It is usually used with other medicines to treat heart failure. This medicine may be used for other purposes; ask your health care provider or pharmacist if you have  questions. COMMON BRAND NAME(S): Entresto What should I tell my health care provider before I take this medicine? They need to know if you have any of these conditions:  diabetes and take a medicine that contains aliskiren  kidney disease  liver disease  an unusual or allergic reaction to sacubitril; valsartan, drugs called angiotensin converting enzyme (ACE) inhibitors, angiotensin II receptor blockers (ARBs), other medicines, foods, dyes, or preservatives  pregnant or trying to get pregnant  breast-feeding How should I use this medicine? Take this medicine by mouth with a glass of water. Follow the directions on the prescription label. You can take it with or without food. If it upsets your stomach, take it with food. Take your medicine at regular intervals. Do not take it more often than directed. Do not stop taking except on your doctor's advice. Do not take this medicine for at least 36 hours before or after you take an ACE inhibitor medicine. Talk to your health care provider if you are not sure if you take an ACE inhibitor. Talk to your pediatrician regarding the use of this medicine in children. Special care may be needed. Overdosage: If you think you have taken too much of this medicine contact a poison control center or emergency room at once. NOTE: This medicine is only for you. Do not share this medicine with others. What if I miss a dose? If you miss a dose, take it as soon as you can. If it is almost time for next dose, take only  that dose. Do not take double or extra doses. What may interact with this medicine? Do not take this medicine with any of the following medicines:  aliskiren if you have diabetes  angiotensin-converting enzyme (ACE) inhibitors, like benazepril, captopril, enalapril, fosinopril, lisinopril, or ramipril This medicine may also interact with the following medicines:  angiotensin II receptor blockers (ARBs) like azilsartan, candesartan, eprosartan,  irbesartan, losartan, olmesartan, telmisartan, or valsartan  lithium  NSAIDS, medicines for pain and inflammation, like ibuprofen or naproxen  potassium-sparing diuretics like amiloride, spironolactone, and triamterene  potassium supplements This list may not describe all possible interactions. Give your health care provider a list of all the medicines, herbs, non-prescription drugs, or dietary supplements you use. Also tell them if you smoke, drink alcohol, or use illegal drugs. Some items may interact with your medicine. What should I watch for while using this medicine? Tell your doctor or healthcare professional if your symptoms do not start to get better or if they get worse. Do not become pregnant while taking this medicine. Women should inform their doctor if they wish to become pregnant or think they might be pregnant. There is a potential for serious side effects to an unborn child. Talk to your health care professional or pharmacist for more information. You may get dizzy. Do not drive, use machinery, or do anything that needs mental alertness until you know how this medicine affects you. Do not stand or sit up quickly, especially if you are an older patient. This reduces the risk of dizzy or fainting spells. Avoid alcoholic drinks; they can make you more dizzy. What side effects may I notice from receiving this medicine? Side effects that you should report to your doctor or health care professional as soon as possible:  allergic reactions like skin rash, itching or hives, swelling of the face, lips, or tongue  signs and symptoms of increased potassium like muscle weakness; chest pain; or fast, irregular heartbeat  signs and symptoms of kidney injury like trouble passing urine or change in the amount of urine  signs and symptoms of low blood pressure like feeling dizzy or lightheaded, or if you develop extreme fatigue Side effects that usually do not require medical attention  (report to your doctor or health care professional if they continue or are bothersome):  cough This list may not describe all possible side effects. Call your doctor for medical advice about side effects. You may report side effects to FDA at 1-800-FDA-1088. Where should I keep my medicine? Keep out of the reach of children. Store at room temperature between 15 and 30 degrees C (59 and 86 degrees F). Throw away any unused medicine after the expiration date. NOTE: This sheet is a summary. It may not cover all possible information. If you have questions about this medicine, talk to your doctor, pharmacist, or health care provider.  2020 Elsevier/Gold Standard (2015-05-15 13:54:19)

## 2019-02-06 ENCOUNTER — Other Ambulatory Visit: Payer: Self-pay | Admitting: Cardiology

## 2019-02-08 ENCOUNTER — Telehealth: Payer: Self-pay | Admitting: Cardiology

## 2019-02-08 NOTE — Telephone Encounter (Signed)
I understand and continue lisinopril

## 2019-02-08 NOTE — Telephone Encounter (Signed)
Called patient. She reports she was recently advised by Dr. Bettina Gavia to start [facility], she has discussed this with her husband and doesn't want to start this medication. She hasn't started it and would prefer to just continue lisinopril. Will consult with Dr. Bettina Gavia.

## 2019-02-08 NOTE — Telephone Encounter (Signed)
Please call patient regarding her medicine

## 2019-02-09 MED ORDER — LISINOPRIL 2.5 MG PO TABS: 2.5000 mg | ORAL_TABLET | Freq: Every day | ORAL | 0 refills | Status: DC

## 2019-02-09 NOTE — Telephone Encounter (Signed)
Patient advised that Dr. Bettina Gavia is agreeable to patient continuing lisinopril 2.5 mg daily and not starting entresto 24-26 mg twice daily as planned. Refill sent to [facility] as requested. No further questions.

## 2019-03-07 ENCOUNTER — Ambulatory Visit: Payer: Medicare HMO | Admitting: Cardiology

## 2019-03-10 ENCOUNTER — Ambulatory Visit: Payer: Medicare HMO | Admitting: Hematology & Oncology

## 2019-03-10 ENCOUNTER — Other Ambulatory Visit: Payer: Medicare HMO

## 2019-03-15 ENCOUNTER — Encounter: Payer: Self-pay | Admitting: Family

## 2019-03-15 ENCOUNTER — Other Ambulatory Visit: Payer: Self-pay

## 2019-03-15 ENCOUNTER — Ambulatory Visit (INDEPENDENT_AMBULATORY_CARE_PROVIDER_SITE_OTHER): Payer: Medicare HMO | Admitting: Family

## 2019-03-15 VITALS — BP 102/50 | HR 78 | Resp 18 | Ht 59.0 in | Wt 135.8 lb

## 2019-03-15 DIAGNOSIS — I420 Dilated cardiomyopathy: Secondary | ICD-10-CM

## 2019-03-15 DIAGNOSIS — I872 Venous insufficiency (chronic) (peripheral): Secondary | ICD-10-CM | POA: Diagnosis not present

## 2019-03-15 DIAGNOSIS — I5022 Chronic systolic (congestive) heart failure: Secondary | ICD-10-CM | POA: Diagnosis not present

## 2019-03-15 DIAGNOSIS — R600 Localized edema: Secondary | ICD-10-CM | POA: Diagnosis not present

## 2019-03-15 NOTE — Patient Instructions (Signed)
Medication Instructions:  Your physician has recommended you make the following change in your medication:   CHANGE your Torsemide to 10mg  (one tablet) twice daily for three days. Then return to 10mg  (one tablet) daily.   Wednesday- one tablet when you get home Thursday - one tablet in the morning and one at lunchtime Friday - one tablet in the morning and one at lunchtime Saturday - one tablet daily  *If you need a refill on your cardiac medications before your next appointment, please call your pharmacy*  Lab Work: No lab work today.  If you have labs (blood work) drawn today and your tests are completely normal, you will receive your results only by: Marland Kitchen MyChart Message (if you have MyChart) OR . A paper copy in the mail If you have any lab test that is abnormal or we need to change your treatment, we will call you to review the results.  Testing/Procedures: None today.  Follow-Up: At [facility], you and your health needs are our priority.  As part of our continuing mission to provide you with exceptional heart care, we have created designated Provider Care Teams.  These Care Teams include your primary Cardiologist (physician) and Advanced Practice Providers (APPs -  Physician Assistants and Nurse Practitioners) who all work together to provide you with the care you need, when you need it.  Your next appointment:   3 month(s)  The format for your next appointment:   In Person  Provider:   Shirlee More, MD  Other Instructions  We will plan to increase your fluid pill (Torsemide) for three days. This will help reduce some of the fluid.   I will look at your labs from the PCP tomorrow.   If you are still noticing increased swelling or discomfort in your legs on Monday, please call our office.   Try to elevate your lower extremities when sitting.   Continue to avoid salt.    Chronic Venous Insufficiency Chronic venous insufficiency is a condition where the leg veins  cannot effectively pump blood from the legs to the heart. This happens when the vein walls are either stretched, weakened, or damaged, or when the valves inside the vein are damaged. With the right treatment, you should be able to continue with an active life. This condition is also called venous stasis. What are the causes? Common causes of this condition include: High blood pressure inside the veins (venous hypertension). Sitting or standing too long, causing increased blood pressure in the leg veins. A blood clot that blocks blood flow in a vein (deep vein thrombosis, DVT). Inflammation of a vein (phlebitis) that causes a blood clot to form. Tumors in the pelvis that cause blood to back up. What increases the risk? The following factors may make you more likely to develop this condition: Having a family history of this condition. Obesity. Pregnancy. Living without enough regular physical activity or exercise (sedentary lifestyle). Smoking. Having a job that requires long periods of standing or sitting in one place. Being a certain age. Women in their 60s and 48s and men in their 23s are more likely to develop this condition. What are the signs or symptoms? Symptoms of this condition include: Veins that are enlarged, bulging, or twisted (varicose veins). Skin breakdown or ulcers. Reddened skin or dark discoloration of skin on the leg between the knee and ankle. Brown, smooth, tight, and painful skin just above the ankle, usually on the inside of the leg (lipodermatosclerosis). Swelling of the legs.  How is this diagnosed? This condition may be diagnosed based on: Your medical history. A physical exam. Tests, such as: A procedure that creates an image of a blood vessel and nearby organs and provides information about blood flow through the blood vessel (duplex ultrasound). A procedure that tests blood flow (plethysmography). A procedure that looks at the veins using X-ray and dye  (venogram). How is this treated? The goals of treatment are to help you return to an active life and to minimize pain or disability. Treatment depends on the severity of your condition, and it may include: Wearing compression stockings. These can help relieve symptoms and help prevent your condition from getting worse. However, they do not cure the condition. Sclerotherapy. This procedure involves an injection of a solution that shrinks damaged veins. Surgery. This may involve: Removing a diseased vein (vein stripping). Cutting off blood flow through the vein (laser ablation surgery). Repairing or reconstructing a valve within the affected vein. Follow these instructions at home: Wear compression stockings as told by your health care provider. These stockings help to prevent blood clots and reduce swelling in your legs. Take over-the-counter and prescription medicines only as told by your health care provider. Stay active by exercising, walking, or doing different activities. Ask your health care provider what activities are safe for you and how much exercise you need. Drink enough fluid to keep your urine pale yellow. Do not use any products that contain nicotine or tobacco, such as cigarettes, e-cigarettes, and chewing tobacco. If you need help quitting, ask your health care provider. Keep all follow-up visits as told by your health care provider. This is important. Contact a health care provider if you: Have redness, swelling, or more pain in the affected area. See a red streak or line that goes up or down from the affected area. Have skin breakdown or skin loss in the affected area, even if the breakdown is small. Get an injury in the affected area. Get help right away if: You get an injury and an open wound in the affected area. You have: Severe pain that does not get better with medicine. Sudden numbness or weakness in the foot or ankle below the affected area. Trouble moving your  foot or ankle. A fever. Worse or persistent symptoms. Chest pain. Shortness of breath. Summary Chronic venous insufficiency is a condition where the leg veins cannot effectively pump blood from the legs to the heart. Chronic venous insufficiency occurs when the vein walls become stretched, weakened, or damaged, or when valves within the vein are damaged. Treatment depends on how severe your condition is. It often involves wearing compression stockings and may involve having a procedure. Make sure you stay active by exercising, walking, or doing different activities. Ask your health care provider what activities are safe for you and how much exercise you need. This information is not intended to replace advice given to you by your health care provider. Make sure you discuss any questions you have with your health care provider. Document Released: 08/03/2006 Document Revised: 12/21/2017 Document Reviewed: 12/21/2017 Elsevier Patient Education  2020 [employer].

## 2019-03-15 NOTE — Progress Notes (Signed)
Office Visit    Patient Name: Nichole Johnson Date of Encounter: 03/15/2019  Primary Care Provider:  Robyne Peers, MD Primary Cardiologist:  Shirlee More, MD Electrophysiologist:  None   Chief Complaint    Nichole Johnson is a 73 y.o. female with a hx of dilated cardiomyopathy, chronic systolic heart failure, HLD, breast cancer, hypothyroidism presents today for swelling.   Past Medical History    Past Medical History:  Diagnosis Date   Acute on chronic systolic congestive heart failure ([place]) 05/19/2016   Acute renal insufficiency 06/09/2016   AKI (acute kidney injury) ([place]) 11/03/2017   Anemia 04/18/2014   Benign hypertension 07/08/2016   Breast cancer ([place]) 2001   Left Breast Cancer   Cancer ([place])    Cardiomyopathy due to chemotherapy ([place]) 04/10/2018   Dilated cardiomyopathy ([place]) 04/10/2018   Dyspepsia 01/18/2017   Dysplastic nevus 12/02/2016   Glaucoma 10/14/2017   High cholesterol    Hyperlipidemia 04/18/2014   Hypertension    Hypertensive heart disease with heart failure ([place]) 04/10/2018   Hypotension 06/09/2016   Hypothyroidism 04/18/2014   Irritable bowel syndrome without diarrhea [id number]   Mild diastolic dysfunction [id number]   Overview:  Last Assessment & Plan:  Pt will f/u w/ Cardiology re this. Pt was given copy of recent ECHO report.  Last Assessment & Plan:  Pt will f/u w/ Cardiology re this. Pt was given copy of recent ECHO report.    Obstructive sleep apnea (adult) (pediatric) 07/08/2016   Personal history of radiation therapy 2001   Left Breast Cancer   Reactive airway disease without complication [id number]   Past Surgical History:  Procedure Laterality Date   BREAST LUMPECTOMY Left 2001   CESAREAN SECTION     ROTATOR CUFF REPAIR Bilateral    SHOULDER CLOSED REDUCTION      Allergies  Allergies  Allergen Reactions   Sulfamethoxazole-Trimethoprim Other (See Comments)    Makes her shaky and sweat   Tramadol Other (See Comments)      Makes her shaky and sweat   Penicillins Itching and Other (See Comments)    Vulvovaginal Candidiasis with associated pruritis Vaginal Infections Vaginal Infections     History of Present Illness    Nichole Johnson is a 73 y.o. female with a hx of cardiomyopathy EF 28%, breast cancer s/p chemotherapy and radiation, arthritis last seen 02/02/19 by Dr. Bettina Gavia.  Approximately 2004 she had breast cancer treated with chemotherapy including Adriamycin, surgery, and radiation therapy. 08/12/16 cardiac MRI EF 26%, dilated LV 6.2cm, mild to moderate MR. 05/24/17 [facility] echo EF 28%, severe global LV hypokinesia, she is a previous patient of Dr. Donnetta Hutching. She then established with Dr. Bettina Gavia. Optimization of heart failure therapies has been limited by hypotension and patient's hesitation to accept additional therapies.   At last office visit 02/02/19 she was recommended Entresto 24/26mg  daily, but called the next week to request continue her Lisinopril 2.5mg  daily instead. Her husband is a retired Software engineer and has concerns related to [employer].   She was seen by her PCP office today and recommended to see her cardiologist. They noted LE edema and venous stasis changes of the lower extremities.   Reports wheezing over the last 1-2 weeks that occurs with and without activity for which she had been using her albuterol inhaler approximately daily. She denies cough. Reports some dyspnea that occurs with activity.   Reports LE edema onset today. Yesterday her legs were sore. Today she noticed some swelling and that she  has some red spots to bilateral LE from ankle to knee. Her legs are not painful today unless she presses on the swelling.   She does all of the cooking at home and does not add salt. She does admit she is not very active and her PCP has encouraged her to increase her activity. Sits in dependent position most of the day. Does not wear compression stockings.   She reports no chest pain,  pressure, tightness. She reports no orthopnea nor PND. She has not had to add extra pillow to sleep.   EKGs/Labs/Other Studies Reviewed:   The following studies were reviewed today:  Echo 05/2017 Findings Mitral Valve Structurally normal mitral valve. Mild mitral regurgitation. Aortic Valve There is mild aortic sclerosis noted, with no evidence of stenosis. No aortic regurgitation. Tricuspid Valve Tricuspid valve is structurally normal. Mild tricuspid regurgitation . Pulmonic Valve Pulmonic valve is structurally normal. No Doppler evidence of pulmonic stenosis or insufficiency. Left Atrium Normal size left atrium. Left Ventricle Ejection fraction is visually estimated at 28% Severe global LV hypokinesis. Diastolic function appears normal Right Atrium Normal right atrium. Right Ventricle Normal right ventricular size and function. Pericardial Effusion No evidence of pericardial effusion. Miscellaneous The aorta is within normal limits. The IVC is normal  EKG:  No EKG today.  Recent Labs:  02/23/19 via [facility]:  K 4, creatinine 0.91, GFR 62, ALT 15, AST 17 Recent Lipid Panel 02/23/19 via [facility]:  Total cholesterol 170  HDL 54  LDL 91  Triglycerides 125  Home Medications   Current Meds  Medication Sig   albuterol (PROVENTIL HFA;VENTOLIN HFA) 108 (90 Base) MCG/ACT inhaler Inhale 2 puffs into the lungs every 6 (six) hours as needed for wheezing or shortness of breath.   alendronate (FOSAMAX) 5 MG tablet Take 5 mg by mouth once a week. Take with a full glass of water on an empty stomach.    Aspirin (ASPIR-81 PO) Take by mouth every morning.   brinzolamide (AZOPT) 1 % ophthalmic suspension Place 1 drop into both eyes 2 (two) times daily.   Calcium Carbonate-Vitamin D (CALCIUM + D PO) Take 1 tablet by mouth 2 (two) times daily.    Cholecalciferol (VITAMIN D-1000 MAX ST) 25 MCG (1000 UT) tablet Take 1,000 Units by mouth daily.    ferrous  sulfate 325 (65 FE) MG tablet Take 325 mg by mouth daily.   Fish Oil OIL Take 1 tablet by mouth 2 (two) times daily.    fluticasone (FLONASE) 50 MCG/ACT nasal spray Place 2 sprays into both nostrils daily.   fluticasone (FLOVENT HFA) 110 MCG/ACT inhaler Inhale 2 puffs into the lungs 2 (two) times daily.   levothyroxine (SYNTHROID, LEVOTHROID) 100 MCG tablet Take 100 mcg by mouth daily.   lisinopril (ZESTRIL) 2.5 MG tablet Take 1 tablet (2.5 mg total) by mouth daily.   Loratadine (CLARITIN) 10 MG CAPS Take 10 mg by mouth daily.   metoprolol succinate (TOPROL-XL) 50 MG 24 hr tablet Take 50 mg by mouth daily. Take with or immediately following a meal.   Multiple Vitamins-Minerals (CENTRUM SILVER PO) Take by mouth every morning.   psyllium (METAMUCIL) 58.6 % packet Take 1 packet by mouth daily.   simvastatin (ZOCOR) 10 MG tablet Take 10 mg by mouth at bedtime.   torsemide (DEMADEX) 10 MG tablet Take 10 mg by mouth daily.   Travoprost, BAK Free, (TRAVATAN Z) 0.004 % SOLN ophthalmic solution Place 1 drop into both eyes at bedtime.   vitamin C (  ASCORBIC ACID) 500 MG tablet Take 500 mg by mouth daily.    Review of Systems      Review of Systems  Constitution: Negative for chills, fever and malaise/fatigue.  Cardiovascular: Positive for dyspnea on exertion and leg swelling. Negative for chest pain, irregular heartbeat, near-syncope, orthopnea and palpitations.  Respiratory: Positive for wheezing. Negative for cough, shortness of breath and sleep disturbances due to breathing.   Gastrointestinal: Negative for nausea and vomiting.  Neurological: Negative for dizziness, light-headedness and weakness.   All other systems reviewed and are otherwise negative except as noted above.  Physical Exam    VS:  Pulse 78    Resp 18    Wt 135 lb 12 oz (61.6 kg)    SpO2 97%    BMI 27.42 kg/m  , BMI Body mass index is 27.42 kg/m. GEN: Well nourished, well developed, in no acute distress. HEENT:  normal. Neck: Supple, no JVD, carotid bruits, or masses. Cardiac: RRR, no murmurs, rubs, or gallops. No clubbing, cyanosis. Nonpitting edema bilateral lower extremity ankle to knee.  Radials/DP/PT 2+ and equal bilaterally.  Respiratory:  Respirations regular and unlabored, clear to auscultation bilaterally. GI: Soft, nontender, nondistended, BS + x 4. MS: No deformity or atrophy. Skin: Warm and dry. Pin point red dots scattered on bilateral lower extremity that are not raised, have no exudate. Bilateral LE with varicose veins. Neuro:  Strength and sensation are intact. Psych: Normal affect.  Assessment & Plan    1. Bilateral LE edema - Onset today. Trace edema bilateral LE. Does not add salt, no recent long trips, does sit most of the day. As she has had increased wheezing over the last 1-2 weeks relieved by Albuterol inhaler.   Increase Torsemide to 10mg  twice daily for 3 days, then return to once daily. If her symptoms do not improve she will call our office Monday.   Her PCP drew a BMP and BNP today and I will follow those.  Recommend elevate lower extremities.  Continue low sodium diet.  2. Cardiomyopathy - LVEF 28%. Politely declines Entresto. Continue beta blocker, ACE, Torsemide. NYHA II. Plan to increased Torsemide for 3 days, as above. Then return to 10mg  daily. Continue low salt diet. Encouraged increased activity.   3. Chronic systolic heart failure - LVEF 28%. Plan, as above.   4. Venous insufficiency - Bilateral LE with varicose veins. Likely partial etiology of LE edema as she sits in dependent position. Encouraged elevation of lower extremities. Encourage compression stockings for long periods of sitting and travel. She has some irritation to bilateral LE, encouraged her to use a good emollient lotion.   Disposition: Follow up in 3 month(s) with Dr. Bettina Gavia.   Loel Dubonnet, NP 03/15/2019, 4:08 PM

## 2019-03-17 ENCOUNTER — Ambulatory Visit: Payer: Medicare HMO | Admitting: Hematology & Oncology

## 2019-03-17 ENCOUNTER — Other Ambulatory Visit: Payer: Medicare HMO

## 2019-03-20 ENCOUNTER — Telehealth: Payer: Self-pay | Admitting: Cardiology

## 2019-03-20 ENCOUNTER — Encounter: Payer: Self-pay | Admitting: Family

## 2019-03-20 DIAGNOSIS — N179 Acute kidney failure, unspecified: Secondary | ICD-10-CM

## 2019-03-20 DIAGNOSIS — I5023 Acute on chronic systolic (congestive) heart failure: Secondary | ICD-10-CM

## 2019-03-20 DIAGNOSIS — N289 Disorder of kidney and ureter, unspecified: Secondary | ICD-10-CM

## 2019-03-20 DIAGNOSIS — I110 Hypertensive heart disease with heart failure: Secondary | ICD-10-CM

## 2019-03-20 MED ORDER — TORSEMIDE 10 MG PO TABS: ORAL_TABLET | ORAL | Status: DC

## 2019-03-20 NOTE — Telephone Encounter (Signed)
Patient reports after her office visit with Urban Gibson, NP on 03/15/2019, she took torsemide 20 mg for three days (Wednesday, Thursday, and Friday) as directed and her bilateral lower extremity edema improved somewhat. Patient is concerned as her legs are still swollen and wants to know how she should continue to take her torsemide. Patient reports her vital signs have been within normal limits.   Spoke with Dr. Agustin Cree as Urban Gibson, NP is out of the office and he advised for patient to alternate between 10 mg of torsemide one day and then 20 mg the next day and continue to alternate with these doses. Patient informed to return to our [facility] office to have repeat lab work drawn (BMP). No appointment needed, no need to fast beforehand. Advised her to continue to monitor her BP and call our office with any further questions or concerns. Patient is agreeable and verbalized understanding. No further questions.

## 2019-03-20 NOTE — Telephone Encounter (Signed)
Please call patient regarding her meds

## 2019-04-23 NOTE — Progress Notes (Signed)
Cardiology Office Note:    Date:  04/24/2019   ID:  Nichole Johnson, DOB 03/12/1946, MRN [id number]  PCP:  Robyne Peers, MD  Cardiologist:  Shirlee More, MD    Referring MD: Robyne Peers, MD    ASSESSMENT:    1. Hypertensive heart disease with heart failure ([place])   2. Dilated cardiomyopathy ([place])   3. Chronic systolic heart failure (H[facility])    PLAN:    In order of problems listed above:  1. Her heart failure is symptomatic she will increase her diuretic and hopefully allow me to get her to appropriate guideline directed therapy adding Entresto and MRA.  Culturally she does not want to change treatment without her husband being involved and he will come to the next visit.  Think an MRI would be helpful with gadolinium enhancement to guide treatment and even to raise the issue of ICD therapy.  Check labs for safety BMP and proBNP continue to weigh daily and sodium restrict. 2. Is a severe dilated cardiomyopathy due to chemotherapy she has developed progressive heart failure discussion 3.  BP at target continue current treatment beta-blocker lisinopril  Next appointment: 6 weeks   Medication Adjustments/Labs and Tests Ordered: Current medicines are reviewed at length with the patient today.  Concerns regarding medicines are outlined above.  No orders of the defined types were placed in this encounter.  No orders of the defined types were placed in this encounter.   Chief Complaint  Patient presents with  . Follow-up  . Cardiomyopathy    She was in decompensated heart failure last visit    History of Present Illness:    Nichole Johnson is a 74 y.o. female with a hx of  cardiomyopathy ejection fraction 28% severe global left ventricular hypokinesia 05/24/2017 at [facility] cardiology previously cared for by Dr.Kalil. last seen 04/11/2018. Previous evaluation at [facility] cardiac MRI 08/12/2016 showed a calculated ejection fraction of 26% dilated left ventricle 6.2 cm mild  to moderate mitral regurgitation. m that report says that she has had a history of chemotherapy including Adriamycin along with surgery and radiation therapy for breast cancer with a history of heart failure occurring 14 years prior to the cardiac MRI.  He was seen at [facility] by Dr. Tammi Klippel regarding her cardiomyopathy 08/11/2016.  Because of hypotension he did not initiate spironolactone or Entresto.She declined ARNI/ MRA and ICD. She was ast seen by me 02/02/2019 and subsequently by Laurann Montana, NP 03/15/2019 with shortness of breath and lower extremity edema thought to be in decompensated heart failure and diuretic dose was increased.  She again declined vasodilator therapy with Entresto. Compliance with diet, lifestyle and medications: Yes  She is improved she took extra diuretic for a few days and is back to taking a low-dose of torsemide once daily still has edema and has congestion she is using Mucinex.  No orthopnea no dyspnea on exertion chest pain palpitation or syncope.  Again I discussed with her the natural history of heart disease she has severe left ventricular dysfunction chronic heart failure and remains decompensated.  I negotiated with her to take torsemide adequate dose of 20 mg a day recheck labs BMP proBNP and after discussion we will check a cardiac MR for precise ejection fraction and findings of late gadolinium enhancement I tried to initiate Entresto and she declined.  She has been to bring her husband back to the next visit I really think we need to raise the issue and also talk about  ICD.  In the interim she will continue her beta-blocker and lisinopril.  Echo 05/2017 Findings Mitral Valve Structurally normal mitral valve. Mild mitral regurgitation. Aortic Valve There is mild aortic sclerosis noted, with no evidence of stenosis. No aortic regurgitation. Tricuspid Valve Tricuspid valve is structurally normal. Mild tricuspid regurgitation . Pulmonic Valve Pulmonic valve is  structurally normal. No Doppler evidence of pulmonic stenosis or insufficiency. Left Atrium Normal size left atrium. Left Ventricle Ejection fraction is visually estimated at 28% Severe global LV hypokinesis. Diastolic function appears normal Right Atrium Normal right atrium. Right Ventricle Normal right ventricular size and function. Pericardial Effusion No evidence of pericardial effusion. Miscellaneous The aorta is within normal limits. The IVC is normal Past Medical History:  Diagnosis Date  . Acute on chronic systolic congestive heart failure ([place]) 05/19/2016  . Acute renal insufficiency 06/09/2016  . AKI (acute kidney injury) ([place]) 11/03/2017  . Anemia 04/18/2014  . Benign hypertension 07/08/2016  . Breast cancer ([place]) 2001   Left Breast Cancer  . Cancer ([place])   . Cardiomyopathy due to chemotherapy ([place]) 04/10/2018  . Dilated cardiomyopathy ([place]) 04/10/2018  . Dyspepsia 01/18/2017  . Dysplastic nevus 12/02/2016  . Glaucoma 10/14/2017  . High cholesterol   . Hyperlipidemia 04/18/2014  . Hypertension   . Hypertensive heart disease with heart failure ([place]) 04/10/2018  . Hypotension 06/09/2016  . Hypothyroidism 04/18/2014  . Irritable bowel syndrome without diarrhea 12/10/2014  . Mild diastolic dysfunction [id number]   Overview:  Last Assessment & Plan:  Pt will f/u w/ Cardiology re this. Pt was given copy of recent ECHO report.  Last Assessment & Plan:  Pt will f/u w/ Cardiology re this. Pt was given copy of recent ECHO report.   . Obstructive sleep apnea (adult) (pediatric) 07/08/2016  . Personal history of radiation therapy 2001   Left Breast Cancer  . Reactive airway disease without complication [id number]    Past Surgical History:  Procedure Laterality Date  . BREAST LUMPECTOMY Left 2001  . CESAREAN SECTION    . ROTATOR CUFF REPAIR Bilateral   . SHOULDER CLOSED REDUCTION      Current Medications: Current Meds  Medication Sig  . albuterol (PROVENTIL HFA;VENTOLIN HFA) 108 (90  Base) MCG/ACT inhaler Inhale 2 puffs into the lungs every 6 (six) hours as needed for wheezing or shortness of breath.  Marland Kitchen alendronate (FOSAMAX) 5 MG tablet Take 5 mg by mouth once a week. Take with a full glass of water on an empty stomach.   . Aspirin (ASPIR-81 PO) Take by mouth every morning.  . brinzolamide (AZOPT) 1 % ophthalmic suspension Place 1 drop into both eyes 2 (two) times daily.  . Calcium Carbonate-Vitamin D (CALCIUM + D PO) Take 1 tablet by mouth 2 (two) times daily.   . Cholecalciferol (VITAMIN D-1000 MAX ST) 25 MCG (1000 UT) tablet Take 1,000 Units by mouth daily.   . ferrous sulfate 325 (65 FE) MG tablet Take 325 mg by mouth daily.  . Fish Oil OIL Take 1 tablet by mouth 2 (two) times daily.   . fluticasone (FLONASE) 50 MCG/ACT nasal spray Place 2 sprays into both nostrils daily.  . fluticasone (FLOVENT HFA) 110 MCG/ACT inhaler Inhale 2 puffs into the lungs 2 (two) times daily.  Marland Kitchen levalbuterol (XOPENEX HFA) 45 MCG/ACT inhaler Inhale 1-2 puffs into the lungs every 6 (six) hours as needed for wheezing.  Marland Kitchen levothyroxine (SYNTHROID, LEVOTHROID) 100 MCG tablet Take 100 mcg by mouth daily.  Marland Kitchen lisinopril (ZESTRIL) 2.5 MG tablet  Take 1 tablet (2.5 mg total) by mouth daily.  . Loratadine (CLARITIN) 10 MG CAPS Take 10 mg by mouth daily.  . metoprolol succinate (TOPROL-XL) 50 MG 24 hr tablet Take 50 mg by mouth daily. Take with or immediately following a meal.  . Multiple Vitamins-Minerals (CENTRUM SILVER PO) Take by mouth every morning.  . psyllium (METAMUCIL) 58.6 % packet Take 1 packet by mouth daily.  . simvastatin (ZOCOR) 10 MG tablet Take 10 mg by mouth at bedtime.  . torsemide (DEMADEX) 10 MG tablet Take 1 tablet (10 mg) one day then take 2 tablets (20 mg) the next day and continue to alternate.  . Travoprost, BAK Free, (TRAVATAN Z) 0.004 % SOLN ophthalmic solution Place 1 drop into both eyes at bedtime.  . vitamin C (ASCORBIC ACID) 500 MG tablet Take 500 mg by mouth daily.      Allergies:   Sulfamethoxazole-trimethoprim, Tramadol, and Penicillins   Social History   Socioeconomic History  . Marital status: Married    Spouse name: Not on file  . Number of children: Not on file  . Years of education: Not on file  . Highest education level: Not on file  Occupational History  . Not on file  Tobacco Use  . Smoking status: Never Smoker  . Smokeless tobacco: Never Used  . Tobacco comment: NEVER USED TOBACCO  Substance and Sexual Activity  . Alcohol use: Yes    Alcohol/week: 0.0 standard drinks    Comment: occ  . Drug use: No  . Sexual activity: Not on file  Other Topics Concern  . Not on file  Social History Narrative  . Not on file   Social Determinants of Health   Financial Resource Strain:   . Difficulty of Paying Living Expenses: Not on file  Food Insecurity:   . Worried About Charity fundraiser in the Last Year: Not on file  . Ran Out of Food in the Last Year: Not on file  Transportation Needs:   . Lack of Transportation (Medical): Not on file  . Lack of Transportation (Non-Medical): Not on file  Physical Activity:   . Days of Exercise per Week: Not on file  . Minutes of Exercise per Session: Not on file  Stress:   . Feeling of Stress : Not on file  Social Connections:   . Frequency of Communication with Friends and Family: Not on file  . Frequency of Social Gatherings with Friends and Family: Not on file  . Attends Religious Services: Not on file  . Active Member of Clubs or Organizations: Not on file  . Attends Archivist Meetings: Not on file  . Marital Status: Not on file     Family History: The patient's family history includes Hypertension in her mother. There is no history of Heart attack, Stroke, or Diabetes. ROS:   Please see the history of present illness.    All other systems reviewed and are negative.  EKGs/Labs/Other Studies Reviewed:    The following studies were reviewed today:  EKG:  EKG  01/31/2019  personally reviewed sinus rhythm LVH repolarization left anterior hemiblock consider extensive anterior MI  Recent Labs: No results found for requested labs within last 8760 hours.  Recent Lipid Panel No results found for: CHOL, TRIG, HDL, CHOLHDL, VLDL, LDLCALC, LDLDIRECT  Physical Exam:    VS:  BP 120/80   Pulse 95   Ht 4\' 11"  (1.499 m)   Wt 135 lb (61.2 kg)   SpO2 99%  BMI 27.27 kg/m     Wt Readings from Last 3 Encounters:  04/24/19 135 lb (61.2 kg)  03/15/19 135 lb 12 oz (61.6 kg)  02/02/19 133 lb 6.4 oz (60.5 kg)     GEN:  Well nourished, well developed in no acute distress HEENT: Normal NECK: No JVD; No carotid bruits LYMPHATICS: No lymphadenopathy CARDIAC: Soft S1 soft S3 RRR, no murmurs, rubs, gallops RESPIRATORY:  Clear to auscultation without rales, wheezing or rhonchi  ABDOMEN: Soft, non-tender, non-distended MUSCULOSKELETAL: 1-2+ bilateral ankle to knee pitting edema; No deformity  SKIN: Warm and dry NEUROLOGIC:  Alert and oriented x 3 PSYCHIATRIC:  Normal affect    Signed, Shirlee More, MD  04/24/2019 10:15 AM    [place]

## 2019-04-24 ENCOUNTER — Ambulatory Visit (INDEPENDENT_AMBULATORY_CARE_PROVIDER_SITE_OTHER): Payer: Medicare HMO | Admitting: Cardiology

## 2019-04-24 ENCOUNTER — Encounter: Payer: Self-pay | Admitting: Cardiology

## 2019-04-24 ENCOUNTER — Other Ambulatory Visit: Payer: Self-pay

## 2019-04-24 VITALS — BP 120/80 | HR 95 | Ht 59.0 in | Wt 135.0 lb

## 2019-04-24 DIAGNOSIS — I5022 Chronic systolic (congestive) heart failure: Secondary | ICD-10-CM

## 2019-04-24 DIAGNOSIS — I110 Hypertensive heart disease with heart failure: Secondary | ICD-10-CM | POA: Diagnosis not present

## 2019-04-24 DIAGNOSIS — I420 Dilated cardiomyopathy: Secondary | ICD-10-CM

## 2019-04-24 MED ORDER — TORSEMIDE 20 MG PO TABS: 20.0000 mg | ORAL_TABLET | Freq: Every day | ORAL | 3 refills | Status: DC

## 2019-04-24 NOTE — Patient Instructions (Signed)
Medication Instructions:  Start: Torsemide 20 MG Daily   *If you need a refill on your cardiac medications before your next appointment, please call your pharmacy*  Lab Work: BMP   If you have labs (blood work) drawn today and your tests are completely normal, you will receive your results only by: Marland Kitchen MyChart Message (if you have MyChart) OR . A paper copy in the mail If you have any lab test that is abnormal or we need to change your treatment, we will call you to review the results.  Testing/Procedures: Your physician has requested that you have a cardiac MRI. Cardiac MRI uses a computer to create images of your heart as its beating, producing both still and moving pictures of your heart and major blood vessels. For further information please visit [email or web address] Please follow the instruction sheet given to you today for more information.   Follow-Up: At [facility], you and your health needs are our priority.  As part of our continuing mission to provide you with exceptional heart care, we have created designated Provider Care Teams.  These Care Teams include your primary Cardiologist (physician) and Advanced Practice Providers (APPs -  Physician Assistants and Nurse Practitioners) who all work together to provide you with the care you need, when you need it.  Your next appointment:   6 week(s)  The format for your next appointment:   In Person  Provider:   Shirlee More, MD  Other Instructions None

## 2019-04-25 LAB — BASIC METABOLIC PANEL
BUN/Creatinine Ratio: 26 (ref 12–28)
BUN: 24 mg/dL (ref 8–27)
CO2: 27 mmol/L (ref 20–29)
Calcium: 9.7 mg/dL (ref 8.7–10.3)
Chloride: 100 mmol/L (ref 96–106)
Creatinine, Ser: 0.94 mg/dL (ref 0.57–1.00)
GFR calc Af Amer: 69 mL/min/{1.73_m2} (ref >59)
GFR calc non Af Amer: 60 mL/min/{1.73_m2} (ref >59)
Glucose: 107 mg/dL — ABNORMAL HIGH (ref 65–99)
Potassium: 4.1 mmol/L (ref 3.5–5.2)
Sodium: 141 mmol/L (ref 134–144)

## 2019-04-26 ENCOUNTER — Telehealth: Payer: Self-pay | Admitting: Emergency Medicine

## 2019-04-26 DIAGNOSIS — I420 Dilated cardiomyopathy: Secondary | ICD-10-CM

## 2019-04-26 DIAGNOSIS — I5023 Acute on chronic systolic (congestive) heart failure: Secondary | ICD-10-CM

## 2019-04-26 NOTE — Telephone Encounter (Signed)
Called patient informed her of results. Pro bnp order added. Lab will be informed per Larene Beach, CMA

## 2019-05-03 LAB — SPECIMEN STATUS REPORT

## 2019-05-03 LAB — PRO B NATRIURETIC PEPTIDE: NT-Pro BNP: 525 pg/mL (ref 0–738)

## 2019-05-04 ENCOUNTER — Telehealth: Payer: Self-pay

## 2019-05-04 NOTE — Telephone Encounter (Signed)
-----   Message from Richardo Priest, MD sent at 05/03/2019  8:04 PM EST ----- Normal or stable result  BNP is elevated, I will discuss meds at office FU

## 2019-05-04 NOTE — Telephone Encounter (Signed)
Unable to leave vm on phone, home number is screened by google and not responding.

## 2019-05-22 ENCOUNTER — Other Ambulatory Visit: Payer: Self-pay | Admitting: Cardiology

## 2019-06-01 ENCOUNTER — Telehealth: Payer: Self-pay | Admitting: Cardiology

## 2019-06-01 DIAGNOSIS — I420 Dilated cardiomyopathy: Secondary | ICD-10-CM

## 2019-06-01 DIAGNOSIS — I110 Hypertensive heart disease with heart failure: Secondary | ICD-10-CM

## 2019-06-01 DIAGNOSIS — I5022 Chronic systolic (congestive) heart failure: Secondary | ICD-10-CM

## 2019-06-01 MED ORDER — TORSEMIDE 20 MG PO TABS: 20.0000 mg | ORAL_TABLET | Freq: Every day | ORAL | 3 refills | Status: DC

## 2019-06-01 NOTE — Telephone Encounter (Signed)
Spoke with patient. Informed patient cardiac MRI will be scheduled once approved by insurance. Reviewed lab results from 04/24/19, verbalized understanding. Torsemide was sent into [facility] mail order pharmacy per patient request.

## 2019-06-01 NOTE — Telephone Encounter (Signed)
  Patient is calling to f/u regarding her MRI orders. She says no one has called her to schedule  Patient also would like results of her last lab work  She also is f/u because she says no one ever called in her prescription for torsemide (DEMADEX) 20 MG tablet which was supposed to go to [facility] order pharmacy

## 2019-06-16 ENCOUNTER — Telehealth: Payer: Self-pay

## 2019-06-16 NOTE — Telephone Encounter (Signed)
Pt canceled fu appt 06/19/19 [place] due to not having MRI yet. Please schedule & obtain prior auth for MRI ordered 04/24/19.

## 2019-06-19 ENCOUNTER — Ambulatory Visit: Payer: Medicare HMO | Admitting: Cardiology

## 2019-06-23 ENCOUNTER — Other Ambulatory Visit: Payer: Self-pay | Admitting: Cardiology

## 2019-06-23 DIAGNOSIS — I110 Hypertensive heart disease with heart failure: Secondary | ICD-10-CM

## 2019-06-23 DIAGNOSIS — I420 Dilated cardiomyopathy: Secondary | ICD-10-CM

## 2019-06-23 DIAGNOSIS — I5022 Chronic systolic (congestive) heart failure: Secondary | ICD-10-CM

## 2019-06-23 NOTE — Telephone Encounter (Signed)
*  STAT* If patient is at the pharmacy, call can be transferred to refill team.   1. Which medications need to be refilled? (please list name of each medication and dose if known)  torsemide (DEMADEX) 20 MG tablet  2. Which pharmacy/location (including street and city if local pharmacy) is medication to be sent to?  [place], [place]  3. Do they need a 30 day or 90 day supply? 90 with refills  PT needs this rx to come from [place]. She wanted to call before she runs out of medication. She only has about a week of medication left

## 2019-06-26 MED ORDER — TORSEMIDE 20 MG PO TABS: 20.0000 mg | ORAL_TABLET | Freq: Every day | ORAL | 2 refills | Status: DC

## 2019-06-26 NOTE — Telephone Encounter (Signed)
Informed pt her rx for Torsemide has been sent to [facility].

## 2019-06-30 ENCOUNTER — Other Ambulatory Visit: Payer: Self-pay | Admitting: Cardiology

## 2019-07-05 ENCOUNTER — Other Ambulatory Visit: Payer: Self-pay | Admitting: Cardiology

## 2019-07-05 DIAGNOSIS — I110 Hypertensive heart disease with heart failure: Secondary | ICD-10-CM

## 2019-07-05 DIAGNOSIS — I420 Dilated cardiomyopathy: Secondary | ICD-10-CM

## 2019-07-05 DIAGNOSIS — I5022 Chronic systolic (congestive) heart failure: Secondary | ICD-10-CM

## 2019-07-05 NOTE — Telephone Encounter (Signed)
 *  STAT* If patient is at the pharmacy, call can be transferred to refill team.   1. Which medications need to be refilled? (please list name of each medication and dose if known)  torsemide (DEMADEX) 20 MG tablet metoprolol succinate (TOPROL-XL) 50 MG 24 hr tablet   2. Which pharmacy/location (including street and city if local pharmacy) is medication to be sent to?  [place], [place]  3. Do they need a 30 day or 90 day supply? 90   Patient has had to double up on her torsemide and is almost out of her medication.

## 2019-07-06 ENCOUNTER — Encounter: Payer: Self-pay | Admitting: Cardiology

## 2019-07-06 ENCOUNTER — Telehealth: Payer: Self-pay | Admitting: *Deleted

## 2019-07-06 MED ORDER — METOPROLOL SUCCINATE ER 50 MG PO TB24: 50.0000 mg | ORAL_TABLET | Freq: Every day | ORAL | 2 refills | Status: DC

## 2019-07-06 MED ORDER — TORSEMIDE 20 MG PO TABS: 20.0000 mg | ORAL_TABLET | Freq: Every day | ORAL | 2 refills | Status: DC

## 2019-07-06 NOTE — Telephone Encounter (Signed)
Spoke with patient regarding appointment for cardiac mri scheduled Wednesday 07/12/19 at 12:00 pm---arrival time is 11:15 am 1st floor radiology---will mail information to patient and it is also inMy CHart

## 2019-07-11 ENCOUNTER — Telehealth (HOSPITAL_COMMUNITY): Payer: Self-pay | Admitting: Emergency Medicine

## 2019-07-11 ENCOUNTER — Encounter (HOSPITAL_COMMUNITY): Payer: Self-pay

## 2019-07-11 NOTE — Telephone Encounter (Signed)
Reaching out to patient to offer assistance regarding upcoming cardiac imaging study; pt verbalizes understanding of appt date/time, parking situation and where to check in, and verified current allergies; name and call back number provided for further questions should they arise Ramesh Moan RN Navigator Cardiac Imaging [facility] [phone] office [phone] cell  Pt denies implants, denies claustro 

## 2019-07-12 ENCOUNTER — Ambulatory Visit (HOSPITAL_COMMUNITY)
Admission: RE | Admit: 2019-07-12 | Discharge: 2019-07-12 | Disposition: A | Discharge status: Home / Self Care | Payer: Medicare HMO | Source: Ambulatory Visit | Attending: Cardiology | Admitting: Cardiology

## 2019-07-12 ENCOUNTER — Other Ambulatory Visit: Payer: Self-pay

## 2019-07-12 DIAGNOSIS — I420 Dilated cardiomyopathy: Secondary | ICD-10-CM | POA: Diagnosis present

## 2019-07-12 DIAGNOSIS — I5022 Chronic systolic (congestive) heart failure: Secondary | ICD-10-CM | POA: Diagnosis present

## 2019-07-12 DIAGNOSIS — I110 Hypertensive heart disease with heart failure: Secondary | ICD-10-CM | POA: Insufficient documentation

## 2019-07-12 LAB — CREATININE, SERUM
Creatinine, Ser: 1.13 mg/dL — ABNORMAL HIGH (ref 0.44–1.00)
GFR calc Af Amer: 55 mL/min — ABNORMAL LOW (ref >60)
GFR calc non Af Amer: 48 mL/min — ABNORMAL LOW (ref >60)

## 2019-07-12 MED ORDER — GADOBUTROL 1 MMOL/ML IV SOLN
8.0000 mL | Freq: Once | INTRAVENOUS | Status: AC | PRN
  Administered 2019-07-12: 8 mL via INTRAVENOUS

## 2019-07-18 NOTE — Progress Notes (Signed)
Cardiology Office Note:    Date:  07/19/2019   ID:  Nichole Johnson, DOB Jan 11, 1946, MRN [id number]  PCP:  Robyne Peers, MD  Cardiologist:  Shirlee More, MD    Referring MD: Robyne Peers, MD   ASSESSMENT:    1. Dilated cardiomyopathy ([place])   2. Hypertensive heart disease with heart failure ([place])    PLAN:    In order of problems listed above:  1. 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 Pierre Bali for advanced heart failure consultation and further decision making with alterations in therapy transitioning ACE inhibitor to [facility] adding MRA and left and right heart catheterization.  The issue of ICD also needs to be reviewed.  In the interim I will reduce her diuretic dosage as she has had systolic blood pressures less than 90 and mild renal dysfunction.   Next appointment: 3 months   Medication Adjustments/Labs and Tests Ordered: Current medicines are reviewed at length with the patient today.  Concerns regarding medicines are outlined above.  No orders of the defined types were placed in this encounter.  No orders of the defined types were placed in this encounter.   Chief Complaint  Patient presents with   Follow-up    Review MRI results    History of Present Illness:    Nichole Johnson is a 74 y.o. female with a hx of  cardiomyopathy ejection fraction 28% severe global left ventricular hypokinesia 05/24/2017 at [facility] cardiology previously cared for by Dr.Kalil. last seen 04/11/2018.  Previous evaluation at [facility] cardiac MRI 08/12/2016 showed a calculated ejection fraction of 26% dilated left ventricle 6.2 cm mild to moderate mitral regurgitation. m that report says that she has had a history of chemotherapy including Adriamycin along with surgery and radiation therapy for breast cancer with a history of heart failure occurring 14 years prior to the cardiac MRI.  He was seen at [facility] by Dr. Tammi Klippel regarding her  cardiomyopathy 08/11/2016.  Because of hypotension he did not initiate spironolactone or Entresto.She declined ARNI/ MRA and ICD.  She had a vasodilator MR ischemia evaluation as part of her MRI at [facility] without any evidence of ischemia.  She was ast seen by me 02/02/2019 and subsequently by Laurann Montana, NP 03/15/2019 with shortness of breath and lower extremity edema thought to be in decompensated heart failure and diuretic dose was increased.  She again declined vasodilator therapy with Entresto.  She was last seen 04/24/2019.  Compliance with diet, lifestyle and medications: Yes  Her husband is present I had met him years ago in [facility] as a cardiologist has been the TEFL teacher and participated in the discussion and decision making.  She does not feel ill exercises every day with higher dose diuretic has had no edema but she is having blood pressures less than 90 and some mild renal dysfunction with a creatinine of 1.13 and GFR 48 cc.  I will reduce her diuretic dosage.  They have been reluctant to accept alternate therapy like Entresto with concerns of hypotension and renal dysfunction.  I reviewed that I feel she has progressive cardiomyopathy may have unrecognized CAD and would benefit from changes in treatment particularly transition from ACE inhibitor to [facility] and adding MRA.  I also think she should undergo left and right heart catheterization with a question of previous MI.  After discussion they would like to meet her advanced heart failure specialist and to get his opinion.  I think they will  accept these alterations.  In the interim she will stay on therapy with beta-blocker ACE inhibitor and her heart failure truly is compensated she has no edema neck vein distention shortness of breath orthopnea chest pain or syncope.  Unfortunately she has progressive left ventricular dysfunction due to Adriamycin chemotherapy.  Cardiac MR 07/17/2019: IMPRESSION: 1. Normal left  ventricular chamber size with severely reduced systolic function. LVEF 20%.  2. Normal right ventricular chamber size, mild-moderately reduced systolic function. RVEF 34%.  3. Transmural delayed myocardial enhancement mid-apical LV anterior wall, suggestive of prior infarct, with no viability in this distribution.  4.  No LV apical thrombus.  5. Moderate size hiatal hernia incidentally noted. Consider dedicated imaging to further assess.   Ref Range & Units 2 mo ago  NT-Pro BNP 0 - 738 pg/mL 525     Past Medical History:  Diagnosis Date   Acute on chronic systolic congestive heart failure ([place]) 05/19/2016   Acute renal insufficiency 06/09/2016   AKI (acute kidney injury) ([place]) 11/03/2017   Anemia 04/18/2014   Benign hypertension 07/08/2016   Breast cancer ([place]) 2001   Left Breast Cancer   Cancer ([place])    Cardiomyopathy due to chemotherapy ([place]) 04/10/2018   Dilated cardiomyopathy ([place]) 04/10/2018   Dyspepsia 01/18/2017   Dysplastic nevus 12/02/2016   Glaucoma 10/14/2017   High cholesterol    Hyperlipidemia 04/18/2014   Hypertension    Hypertensive heart disease with heart failure ([place]) 04/10/2018   Hypotension 06/09/2016   Hypothyroidism 04/18/2014   Irritable bowel syndrome without diarrhea 12/09/9369   Mild diastolic dysfunction 09/20/6787   Overview:  Last Assessment & Plan:  Pt will f/u w/ Cardiology re this. Pt was given copy of recent ECHO report.  Last Assessment & Plan:  Pt will f/u w/ Cardiology re this. Pt was given copy of recent ECHO report.    Obstructive sleep apnea (adult) (pediatric) 07/08/2016   Personal history of radiation therapy 2001   Left Breast Cancer   Reactive airway disease without complication 06/19/1015    Past Surgical History:  Procedure Laterality Date   BREAST LUMPECTOMY Left 2001   CESAREAN SECTION     ROTATOR CUFF REPAIR Bilateral    SHOULDER CLOSED REDUCTION      Current Medications: Current Meds  Medication Sig     alendronate (FOSAMAX) 35 MG tablet Take by mouth.     Allergies:   Sulfamethoxazole-trimethoprim, Tramadol, and Penicillins   Social History   Socioeconomic History   Marital status: Married    Spouse name: Not on file   Number of children: Not on file   Years of education: Not on file   Highest education level: Not on file  Occupational History   Not on file  Tobacco Use   Smoking status: Never Smoker   Smokeless tobacco: Never Used   Tobacco comment: NEVER USED TOBACCO  Substance and Sexual Activity   Alcohol use: Yes    Alcohol/week: 0.0 standard drinks    Comment: occ   Drug use: No   Sexual activity: Not on file  Other Topics Concern   Not on file  Social History Narrative   Not on file   Social Determinants of Health   Financial Resource Strain:    Difficulty of Paying Living Expenses:   Food Insecurity:    Worried About Flippin in the Last Year:    Arboriculturist in the Last Year:   Transportation Needs:    Lack of Transportation (  Medical):    Lack of Transportation (Non-Medical):   Physical Activity:    Days of Exercise per Week:    Minutes of Exercise per Session:   Stress:    Feeling of Stress :   Social Connections:    Frequency of Communication with Friends and Family:    Frequency of Social Gatherings with Friends and Family:    Attends Religious Services:    Active Member of Clubs or Organizations:    Attends Archivist Meetings:    Marital Status:      Family History: The patient's family history includes Hypertension in her mother. There is no history of Heart attack, Stroke, or Diabetes. ROS:   Please see the history of present illness.    All other systems reviewed and are negative.  EKGs/Labs/Other Studies Reviewed:    The following studies were reviewed today:    Recent Labs: 04/24/2019: BUN 24; NT-Pro BNP 525; Potassium 4.1; Sodium 141 07/12/2019: Creatinine, Ser 1.13  Recent  Lipid Panel No results found for: CHOL, TRIG, HDL, CHOLHDL, VLDL, LDLCALC, LDLDIRECT  Physical Exam:    VS:  Ht 4' 11"  (1.499 m)    BMI 27.27 kg/m     Wt Readings from Last 3 Encounters:  04/24/19 135 lb (61.2 kg)  03/15/19 135 lb 12 oz (61.6 kg)  02/02/19 133 lb 6.4 oz (60.5 kg)     GEN: She does not appear ill well nourished, well developed in no acute distress HEENT: Normal NECK: No JVD; No carotid bruits LYMPHATICS: No lymphadenopathy CARDIAC: Soft S1 RRR, no murmurs, rubs, gallops RESPIRATORY:  Clear to auscultation without rales, wheezing or rhonchi  ABDOMEN: Soft, non-tender, non-distended MUSCULOSKELETAL:  No edema; No deformity  SKIN: Warm and dry NEUROLOGIC:  Alert and oriented x 3 PSYCHIATRIC:  Normal affect    Signed, Shirlee More, MD  07/19/2019 10:10 AM    [place]

## 2019-07-19 ENCOUNTER — Ambulatory Visit: Payer: Medicare HMO | Admitting: Cardiology

## 2019-07-19 ENCOUNTER — Other Ambulatory Visit: Payer: Self-pay

## 2019-07-19 ENCOUNTER — Encounter: Payer: Self-pay | Admitting: Cardiology

## 2019-07-19 VITALS — BP 108/78 | HR 78 | Temp 98.4°F | Ht 59.0 in | Wt 134.0 lb

## 2019-07-19 DIAGNOSIS — I420 Dilated cardiomyopathy: Secondary | ICD-10-CM | POA: Diagnosis not present

## 2019-07-19 DIAGNOSIS — I5022 Chronic systolic (congestive) heart failure: Secondary | ICD-10-CM

## 2019-07-19 DIAGNOSIS — E785 Hyperlipidemia, unspecified: Secondary | ICD-10-CM | POA: Diagnosis not present

## 2019-07-19 DIAGNOSIS — I110 Hypertensive heart disease with heart failure: Secondary | ICD-10-CM | POA: Diagnosis not present

## 2019-07-19 MED ORDER — TORSEMIDE 10 MG PO TABS: 10.0000 mg | ORAL_TABLET | Freq: Every day | ORAL | 3 refills | Status: DC

## 2019-07-19 NOTE — Patient Instructions (Signed)
Medication Instructions:  Your physician has recommended you make the following change in your medication:  CHANGE: Torsemide 10mg  take one tablet by mouth daily. If you weigh yourself and you weigh 3lbs greater than your baseline please take 20mg  daily.  *If you need a refill on your cardiac medications before your next appointment, please call your pharmacy*   Lab Work: None If you have labs (blood work) drawn today and your tests are completely normal, you will receive your results only by: Marland Kitchen MyChart Message (if you have MyChart) OR . A paper copy in the mail If you have any lab test that is abnormal or we need to change your treatment, we will call you to review the results.   Testing/Procedures: None   Follow-Up: At [facility], you and your health needs are our priority.  As part of our continuing mission to provide you with exceptional heart care, we have created designated Provider Care Teams.  These Care Teams include your primary Cardiologist (physician) and Advanced Practice Providers (APPs -  Physician Assistants and Nurse Practitioners) who all work together to provide you with the care you need, when you need it.  We recommend signing up for the patient portal called "MyChart".  Sign up information is provided on this After Visit Summary.  MyChart is used to connect with patients for Virtual Visits (Telemedicine).  Patients are able to view lab/test results, encounter notes, upcoming appointments, etc.  Non-urgent messages can be sent to your provider as well.   To learn more about what you can do with MyChart, go to [email or web address].    Your next appointment:   3 month(s)  The format for your next appointment:   In Person  Provider:   Dr. Bettina Gavia   Other Instructions  We are placing a referral for you to see Dr. Haroldine Laws. They will call you to schedule this appointment.

## 2019-07-20 DIAGNOSIS — K449 Diaphragmatic hernia without obstruction or gangrene: Secondary | ICD-10-CM | POA: Insufficient documentation

## 2019-07-20 HISTORY — DX: Diaphragmatic hernia without obstruction or gangrene: K44.9

## 2019-08-17 DIAGNOSIS — M1712 Unilateral primary osteoarthritis, left knee: Secondary | ICD-10-CM | POA: Insufficient documentation

## 2019-08-17 HISTORY — DX: Unilateral primary osteoarthritis, left knee: M17.12

## 2019-08-29 ENCOUNTER — Ambulatory Visit (HOSPITAL_COMMUNITY)
Admission: RE | Admit: 2019-08-29 | Discharge: 2019-08-29 | Disposition: A | Discharge status: Home / Self Care | Payer: Medicare HMO | Source: Ambulatory Visit | Attending: Internal Medicine | Admitting: Internal Medicine

## 2019-08-29 ENCOUNTER — Encounter (HOSPITAL_COMMUNITY): Payer: Self-pay | Admitting: Internal Medicine

## 2019-08-29 ENCOUNTER — Other Ambulatory Visit: Payer: Self-pay

## 2019-08-29 VITALS — BP 100/70 | HR 83 | Wt 134.6 lb

## 2019-08-29 DIAGNOSIS — E7800 Pure hypercholesterolemia, unspecified: Secondary | ICD-10-CM | POA: Diagnosis not present

## 2019-08-29 DIAGNOSIS — I420 Dilated cardiomyopathy: Secondary | ICD-10-CM | POA: Insufficient documentation

## 2019-08-29 DIAGNOSIS — E039 Hypothyroidism, unspecified: Secondary | ICD-10-CM | POA: Diagnosis not present

## 2019-08-29 DIAGNOSIS — I110 Hypertensive heart disease with heart failure: Secondary | ICD-10-CM | POA: Insufficient documentation

## 2019-08-29 DIAGNOSIS — Z7982 Long term (current) use of aspirin: Secondary | ICD-10-CM | POA: Diagnosis not present

## 2019-08-29 DIAGNOSIS — I340 Nonrheumatic mitral (valve) insufficiency: Secondary | ICD-10-CM | POA: Insufficient documentation

## 2019-08-29 DIAGNOSIS — I5022 Chronic systolic (congestive) heart failure: Secondary | ICD-10-CM | POA: Insufficient documentation

## 2019-08-29 DIAGNOSIS — E785 Hyperlipidemia, unspecified: Secondary | ICD-10-CM | POA: Insufficient documentation

## 2019-08-29 DIAGNOSIS — M170 Bilateral primary osteoarthritis of knee: Secondary | ICD-10-CM | POA: Insufficient documentation

## 2019-08-29 DIAGNOSIS — I252 Old myocardial infarction: Secondary | ICD-10-CM | POA: Diagnosis not present

## 2019-08-29 DIAGNOSIS — J45909 Unspecified asthma, uncomplicated: Secondary | ICD-10-CM | POA: Insufficient documentation

## 2019-08-29 DIAGNOSIS — Z9221 Personal history of antineoplastic chemotherapy: Secondary | ICD-10-CM | POA: Diagnosis not present

## 2019-08-29 DIAGNOSIS — Z7901 Long term (current) use of anticoagulants: Secondary | ICD-10-CM | POA: Insufficient documentation

## 2019-08-29 DIAGNOSIS — Z8249 Family history of ischemic heart disease and other diseases of the circulatory system: Secondary | ICD-10-CM | POA: Diagnosis not present

## 2019-08-29 DIAGNOSIS — H409 Unspecified glaucoma: Secondary | ICD-10-CM | POA: Insufficient documentation

## 2019-08-29 DIAGNOSIS — Z853 Personal history of malignant neoplasm of breast: Secondary | ICD-10-CM | POA: Diagnosis not present

## 2019-08-29 DIAGNOSIS — Z79899 Other long term (current) drug therapy: Secondary | ICD-10-CM | POA: Diagnosis not present

## 2019-08-29 NOTE — Patient Instructions (Signed)
Your physician recommends that you schedule a follow-up appointment in: 3 months  If you have any questions or concerns before your next appointment please send [place] a message through [place] or call our office at [phone].  At the [facility], you and your health needs are our priority. As part of our continuing mission to provide you with exceptional heart care, we have created designated Provider Care Teams. These Care Teams include your primary Cardiologist (physician) and Advanced Practice Providers (APPs- Physician Assistants and Nurse Practitioners) who all work together to provide you with the care you need, when you need it.   You may see any of the following providers on your designated Care Team at your next follow up: Marland Kitchen Dr Glori Bickers . Dr Loralie Champagne . Darrick Grinder, NP . Lyda Jester, PA . Audry Riles, PharmD   Please be sure to bring in all your medications bottles to every appointment.

## 2019-08-29 NOTE — Progress Notes (Addendum)
ADVANCED HF CLINIC CONSULT NOTE  Referring Physician: Bettina Gavia Primary Cardiologist: Bettina Gavia  HPI:  Nichole Johnson is a 74 y.o. female with a hx of breast cancer s/p lumpectomy/chemo (adriamycin) and XRT in 2000 ([place]), HL.and chronic systolic HF referred by Dr. Bettina Gavia for further evaluation of her HF.  She is here with her husband. She says she was diagnosed with cardiomyopathy during her chemo in 2000-2001 she has been dealing with HF for 20 years.  After moving to [place] she was initially followed by Dr. Donnetta Hutching who referred her to [facility] where she saw Dr. Loanne Drilling. Cardiac MRI at [facility] on 08/12/16 showed EF 26% with dilated left ventricle 6.2 cm mild to moderate mitral regurgitation. There was subendocardial hyperenhancement involving the mid-distal anterior wall, consistent with subendocardial infarction in the distribution of a diagonal branch of the LAD. Adenosine stress perfusion imaging demonstrated no evidence of inducible myocardial ischemia.Because of hypotension he did not initiate spironolactone or Entresto.She declined ARNI/ MRA and ICD.  More recently has been following with Dr. Bettina Gavia. Med titration limited by low BP and her resistance to start new meds. cMRI repeated 3/21.   cMRI 07/12/19 1. Normal left ventricular chamber size with severely reduced systolic function. LVEF 20%. 2. Normal right ventricular chamber size, mild-moderately reduced systolic function. RVEF 34%. 3. Transmural delayed myocardial enhancement mid-apical LV anterior wall, suggestive of prior infarct, with no viability in thisdistribution. 4.  No LV apical thrombus.  She is here with her husband. Says she is feeling pretty good. Walking on TM 1-2 miles. No CP or SOB. No edema. BP runs low. SBP 95-110.  BP has been too low for Entresto or spiro. Has met with Dr. Ola Spurr regarding with ICD and they decided against it. Says she feels fine and doesn't have any HF symptoms. Says she is limited by  arthritis in knees.    Review of Systems: [y] = yes, _0  = no   General: Weight gain _1 ; Weight loss _2 ; Anorexia _3 ; Fatigue _4 ; Fever _5 ; Chills _6 ; Weakness _7   Cardiac: Chest pain/pressure _8 ; Resting SOB _9 ; Exertional SOB _10 ; Orthopnea _11 ; Pedal Edema _12 ; Palpitations _13 ; Syncope _14 ; Presyncope _15 ; Paroxysmal nocturnal dyspnea_16   Pulmonary: Cough _17 ; Wheezing_18 ; Hemoptysis_19 ; Sputum _20 ; Snoring _21   GI: Vomiting_22 ; Dysphagia_23 ; Melena_24 ; Hematochezia _25 ; Heartburn_26 ; Abdominal pain _27 ; Constipation _28 ; Diarrhea _29 ; BRBPR _30   GU: Hematuria_31 ; Dysuria _32 ; Nocturia_33   Vascular: Pain in legs with walking _34 ; Pain in feet with lying flat _35 ; Non-healing sores _36 ; Stroke _37 ; TIA _38 ; Slurred speech _39 ;  Neuro: Headaches_40 ; Vertigo_41 ; Seizures_42 ; Paresthesias_43 ;Blurred vision _44 ; Diplopia _45 ; Vision changes _46   Ortho/Skin: Arthritis Blue.Reese ]; Joint pain Blue.Reese ]; Muscle pain _47 ; Joint swelling _48 ; Back Pain _49 ; Rash _50   Psych: Depression_51 ; Anxiety_52   Heme: Bleeding problems _53 ; Clotting disorders _54 ; Anemia _55   Endocrine: Diabetes _56 ; Thyroid dysfunction_57    Past Medical History:  Diagnosis Date  . Acute on chronic systolic congestive heart failure ([place]) 05/19/2016  . Acute renal insufficiency 06/09/2016  . AKI (acute kidney injury) ([place]) 11/03/2017  . Anemia 04/18/2014  . Benign hypertension 07/08/2016  . Breast cancer ([place]) 2001   Left Breast Cancer  .  Cancer ([place])   . 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 Dilated cardiomyopathy ([place]) 04/10/2018  . Dyspepsia 01/18/2017  . Dysplastic nevus 12/02/2016  . Glaucoma 10/14/2017  . High cholesterol   . Hyperlipidemia 04/18/2014  . Hypertension   . Hypertensive heart disease with heart failure ([place]) 04/10/2018  . Hypotension 06/09/2016  . Hypothyroidism 04/18/2014  . Irritable bowel syndrome without diarrhea 12/10/2014  . Mild diastolic dysfunction 0/12/3265   Overview:  Last Assessment  & Plan:  Pt will f/u w/ Cardiology re this. Pt was given copy of recent ECHO report.  Last Assessment & Plan:  Pt will f/u w/ Cardiology re this. Pt was given copy of recent ECHO report.   . Obstructive sleep apnea (adult) (pediatric) 07/08/2016  . Personal history of radiation therapy 2001   Left Breast Cancer  . Reactive airway disease without complication 04/14/4578    Current Outpatient Medications  Medication Sig Dispense Refill  . albuterol (PROVENTIL HFA;VENTOLIN HFA) 108 (90 Base) MCG/ACT inhaler Inhale 2 puffs into the lungs every 6 (six) hours as needed for wheezing or shortness of breath. 1 Inhaler 6  . alendronate (FOSAMAX) 5 MG tablet Take 5 mg by mouth once a week. Take with a full glass of water on an empty stomach.     . Aspirin (ASPIR-81 PO) Take by mouth every morning.    . brinzolamide (AZOPT) 1 % ophthalmic suspension Place 1 drop into both eyes 2 (two) times daily.    . Calcium Carbonate-Vitamin D (CALCIUM + D PO) Take 1 tablet by mouth 2 (two) times daily.     . Cholecalciferol (VITAMIN D-1000 MAX ST) 25 MCG (1000 UT) tablet Take 1,000 Units by mouth daily.     . ferrous sulfate 325 (65 FE) MG tablet Take 325 mg by mouth daily.    . Fish Oil OIL Take 1 tablet by mouth 2 (two) times daily.     . fluticasone (FLONASE) 50 MCG/ACT nasal spray Place 2 sprays into both nostrils daily. 16 g 2  . levothyroxine (SYNTHROID, LEVOTHROID) 100 MCG tablet Take 100 mcg by mouth daily.    Marland Kitchen lisinopril (ZESTRIL) 2.5 MG tablet Take 1 tablet (2.5 mg total) by mouth daily. 90 tablet 3  . Loratadine (CLARITIN) 10 MG CAPS Take 10 mg by mouth daily.    . metoprolol succinate (TOPROL-XL) 50 MG 24 hr tablet Take 1 tablet (50 mg total) by mouth daily. Take with or immediately following a meal. 90 tablet 2  . Multiple Vitamins-Minerals (CENTRUM SILVER PO) Take by mouth every morning.    . psyllium (METAMUCIL) 58.6 % packet Take 1 packet by mouth daily as needed.     . simvastatin (ZOCOR) 20 MG tablet  simvastatin 20 mg tablet  TAKE 1 TABLET BY MOUTH ONCE DAILY AT NIGHT    . torsemide (DEMADEX) 10 MG tablet Take 1 tablet (10 mg total) by mouth daily. Take 36m daily if your weight has increased 3 lb above your baseline 45 tablet 3  . Travoprost, BAK Free, (TRAVATAN Z) 0.004 % SOLN ophthalmic solution Place 1 drop into both eyes at bedtime.    . vitamin C (ASCORBIC ACID) 500 MG tablet Take 500 mg by mouth daily.     No current facility-administered medications for this encounter.    Allergies  Allergen Reactions  . Sulfamethoxazole-Trimethoprim Other (See Comments)    Makes her shaky and sweat  . Tramadol Other (See Comments)    Makes her shaky and sweat  .  Penicillins Itching and Other (See Comments)    Vulvovaginal Candidiasis with associated pruritis Vaginal Infections Vaginal Infections       Social History   Socioeconomic History  . Marital status: Married    Spouse name: Not on file  . Number of children: Not on file  . Years of education: Not on file  . Highest education level: Not on file  Occupational History  . Not on file  Tobacco Use  . Smoking status: Never Smoker  . Smokeless tobacco: Never Used  . Tobacco comment: NEVER USED TOBACCO  Substance and Sexual Activity  . Alcohol use: Yes    Alcohol/week: 0.0 standard drinks    Comment: occ  . Drug use: No  . Sexual activity: Not on file  Other Topics Concern  . Not on file  Social History Narrative  . Not on file   Social Determinants of Health   Financial Resource Strain:   . Difficulty of Paying Living Expenses:   Food Insecurity:   . Worried About Charity fundraiser in the Last Year:   . Arboriculturist in the Last Year:   Transportation Needs:   . Film/video editor (Medical):   Marland Kitchen Lack of Transportation (Non-Medical):   Physical Activity:   . Days of Exercise per Week:   . Minutes of Exercise per Session:   Stress:   . Feeling of Stress :   Social Connections:   . Frequency of  Communication with Friends and Family:   . Frequency of Social Gatherings with Friends and Family:   . Attends Religious Services:   . Active Member of Clubs or Organizations:   . Attends Archivist Meetings:   Marland Kitchen Marital Status:   Intimate Partner Violence:   . Fear of Current or Ex-Partner:   . Emotionally Abused:   Marland Kitchen Physically Abused:   . Sexually Abused:       Family History  Problem Relation Age of Onset  . Hypertension Mother   . Heart attack Neg Hx   . Stroke Neg Hx   . Diabetes Neg Hx     Vitals:   08/29/19 1520  BP: 100/70  Pulse: 83  SpO2: 98%  Weight: 61.1 kg (134 lb 9.6 oz)    PHYSICAL EXAM: General:  Well appearing. No respiratory difficulty HEENT: normal Neck: supple. no JVD. Carotids 2+ bilat; no bruits. No lymphadenopathy or thryomegaly appreciated. Cor: PMI nondisplaced. Regular rate & rhythm. No rubs, gallops or murmurs. Lungs: clear Abdomen: soft, nontender, nondistended. No hepatosplenomegaly. No bruits or masses. Good bowel sounds. Extremities: no cyanosis, clubbing, rash, edema Neuro: alert & oriented x 3, cranial nerves grossly intact. moves all 4 extremities w/o difficulty. Affect pleasant.  ECG: NSR 85 IVCD Personally reviewed   ASSESSMENT & PLAN:  1. Chronic systolic HF - given time course, suspect related to adriamycin toxicity by cMRI shows possible diagonal infarct with no ischemia on adenosine stress - she has not had a recent cath - reports NYHA I-II symptoms though I suspect she may be under-reporting her sx - volume status ok - currently on Toprol 50 daily and lisinopril 2.5 daily. She refuses attempts at titration or addition of sprio due to low BP - we discussed role of ICD as well as the possibility of advanced therapies but both her and her husband want to discuss together first. Overall they both seem quite reluctant to pursue more aggressive w/u but they did not completely close the door.We discussed  the fact that low  BP and medication intolerances were signals that her HF was more advanced. - I also discussed the possibility of switching her torsemide to [place] but they wanted to defer that as well  - Finally we discussed the role of RHC and/or CPX testing to further risk stratify - They decided that the did not any changes today but did want to take along some information regarding VAD therapy to review further - I will see them back in several months to f/u. They know how to contact me in the interim if they would like to discuss further.  2. H/o breast CA - s/p lumpectomy, chemo/XRT - norecurrence  Total time spent 45 minutes. Over half that time spent discussing above.   Glori Bickers, MD  10:53 PM

## 2019-09-13 ENCOUNTER — Encounter: Payer: Self-pay | Admitting: Cardiology

## 2019-09-26 ENCOUNTER — Other Ambulatory Visit: Payer: Self-pay | Admitting: Cardiology

## 2019-09-26 MED ORDER — TORSEMIDE 10 MG PO TABS: 10.0000 mg | ORAL_TABLET | Freq: Every day | ORAL | 3 refills | Status: DC

## 2019-09-26 NOTE — Telephone Encounter (Signed)
Spoke to patient just now and let her know that I just sent in this refill for her. She verbalizes understanding and thanks me for the call.    Encouraged patient to call back with any questions or concerns.

## 2019-09-26 NOTE — Telephone Encounter (Signed)
   *  STAT* If patient is at the pharmacy, call can be transferred to refill team.   1. Which medications need to be refilled? (please list name of each medication and dose if known)   torsemide (DEMADEX) 10 MG tablet    2. Which pharmacy/location (including street and city if local pharmacy) is medication to be sent to? [place] [place]  3. Do they need a 30 day or 90 day supply? 90 days   Pt said she is running low. She also requested a call once the prescription sent for refill

## 2019-09-27 ENCOUNTER — Telehealth: Payer: Self-pay

## 2019-09-27 MED ORDER — TORSEMIDE 10 MG PO TABS: 10.0000 mg | ORAL_TABLET | Freq: Every day | ORAL | 3 refills | Status: DC

## 2019-09-27 NOTE — Telephone Encounter (Signed)
Error

## 2019-09-27 NOTE — Telephone Encounter (Signed)
Prescription sent in again per faxed request from [facility].

## 2019-09-29 ENCOUNTER — Telehealth: Payer: Self-pay

## 2019-09-29 MED ORDER — TORSEMIDE 10 MG PO TABS: 10.0000 mg | ORAL_TABLET | Freq: Every day | ORAL | 2 refills | Status: DC

## 2019-09-29 NOTE — Telephone Encounter (Signed)
Refill sent for Torsemide to [place].

## 2019-10-18 ENCOUNTER — Other Ambulatory Visit: Payer: Self-pay | Admitting: Family Medicine

## 2019-10-18 DIAGNOSIS — Z1231 Encounter for screening mammogram for malignant neoplasm of breast: Secondary | ICD-10-CM

## 2019-10-20 ENCOUNTER — Other Ambulatory Visit: Payer: Self-pay

## 2019-10-20 ENCOUNTER — Ambulatory Visit
Admission: RE | Admit: 2019-10-20 | Discharge: 2019-10-20 | Disposition: A | Discharge status: Home / Self Care | Payer: Medicare HMO | Source: Ambulatory Visit | Attending: Family Medicine | Admitting: Family Medicine

## 2019-10-20 DIAGNOSIS — Z1231 Encounter for screening mammogram for malignant neoplasm of breast: Secondary | ICD-10-CM

## 2019-10-24 ENCOUNTER — Ambulatory Visit: Payer: Medicare HMO | Admitting: Cardiology

## 2019-10-24 NOTE — Progress Notes (Deleted)
Cardiology Office Note:    Date:  10/24/2019   ID:  Nichole Johnson, DOB September 30, 1945, MRN [id number]  PCP:  Robyne Peers, MD  Cardiologist:  Shirlee More, MD    Referring MD: Robyne Peers, MD    ASSESSMENT:    No diagnosis found. PLAN:    In order of problems listed above:  1. ***   Next appointment: ***   Medication Adjustments/Labs and Tests Ordered: Current medicines are reviewed at length with the patient today.  Concerns regarding medicines are outlined above.  No orders of the defined types were placed in this encounter.  No orders of the defined types were placed in this encounter.   No chief complaint on file.   History of Present Illness:    Nichole Johnson is a 74 y.o. female with a hx of  cardiomyopathy and heart failure ejection fraction 28% severe global left ventricular hypokinesia 05/24/2017 at [facility] cardiology previously cared for by Dr.Kalil.   Previous evaluation at [facility] cardiac MRI 08/12/2016 showed a calculated ejection fraction of 26% dilated left ventricle 6.2 cm mild to moderate mitral regurgitation. m that report says that she has had a history of chemotherapy including Adriamycin along with surgery and radiation therapy for breast cancer with a history of heart failure occurring 14 years prior to the cardiac MRI.  He was seen at [facility] by Dr. Tammi Klippel regarding her cardiomyopathy 08/11/2016.  Because of hypotension he did not initiate spironolactone or Entresto.She declined ARNI/ MRA and ICD.  She had a vasodilator MR ischemia evaluation as part of her MRI at [facility] without any evidence of ischemia.   She was seen by me 02/02/2019 and subsequently by Laurann Montana, NP 03/15/2019 with shortness of breath and lower extremity edema thought to be in decompensated heart failure and diuretic dose was increased.  She again declined vasodilator therapy with Entresto.   She was last seen 0 07/19/2019 and referred to advanced heart failure Dr.  Tempie Hoist.Marland Kitchen Unfortunately they did not want to have further diagnostic testing change in medications or consideration of ICD at that time Compliance with diet, lifestyle and medications: ***  cMRI 07/12/19 1. Normal left ventricular chamber size with severely reduced systolic function. LVEF 20%. 2. Normal right ventricular chamber size, mild-moderately reduced systolic function. RVEF 34%. 3. Transmural delayed myocardial enhancement mid-apical LV anterior wall, suggestive of prior infarct, with no viability in thisdistribution. 4. No LV apical thrombus.   Ref Range & Units 6 mo ago  NT-Pro BNP 0 - 738 pg/mL 525     Past Medical History:  Diagnosis Date  . Acute on chronic systolic congestive heart failure ([place]) 05/19/2016  . Acute renal insufficiency 06/09/2016  . AKI (acute kidney injury) ([place]) 11/03/2017  . Anemia 04/18/2014  . Benign hypertension 07/08/2016  . Breast cancer ([place]) 2001   Left Breast Cancer  . Cancer ([place])   . 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 Dilated cardiomyopathy ([place]) 04/10/2018  . Dyspepsia 01/18/2017  . Dysplastic nevus 12/02/2016  . Glaucoma 10/14/2017  . High cholesterol   . Hyperlipidemia 04/18/2014  . Hypertension   . Hypertensive heart disease with heart failure ([place]) 04/10/2018  . Hypotension 06/09/2016  . Hypothyroidism 04/18/2014  . Irritable bowel syndrome without diarrhea 12/10/2014  . Mild diastolic dysfunction 10/11/2456   Overview:  Last Assessment & Plan:  Pt will f/u w/ Cardiology re this. Pt was given copy of recent ECHO report.  Last Assessment & Plan:  Pt will f/u  w/ Cardiology re this. Pt was given copy of recent ECHO report.   . Obstructive sleep apnea (adult) (pediatric) 07/08/2016  . Personal history of radiation therapy 2001   Left Breast Cancer  . Reactive airway disease without complication 09/17/6193    Past Surgical History:  Procedure Laterality Date  . BREAST LUMPECTOMY Left 2001  . CESAREAN SECTION    . ROTATOR CUFF  REPAIR Bilateral   . SHOULDER CLOSED REDUCTION      Current Medications: No outpatient medications have been marked as taking for the 10/25/19 encounter (Appointment) with Richardo Priest, MD.     Allergies:   Sulfamethoxazole-trimethoprim, Tramadol, and Penicillins   Social History   Socioeconomic History  . Marital status: Married    Spouse name: Not on file  . Number of children: Not on file  . Years of education: Not on file  . Highest education level: Not on file  Occupational History  . Not on file  Tobacco Use  . Smoking status: Never Smoker  . Smokeless tobacco: Never Used  . Tobacco comment: NEVER USED TOBACCO  Vaping Use  . Vaping Use: Never used  Substance and Sexual Activity  . Alcohol use: Yes    Alcohol/week: 0.0 standard drinks    Comment: occ  . Drug use: No  . Sexual activity: Not on file  Other Topics Concern  . Not on file  Social History Narrative  . Not on file   Social Determinants of Health   Financial Resource Strain:   . Difficulty of Paying Living Expenses:   Food Insecurity:   . Worried About Charity fundraiser in the Last Year:   . Arboriculturist in the Last Year:   Transportation Needs:   . Film/video editor (Medical):   Marland Kitchen Lack of Transportation (Non-Medical):   Physical Activity:   . Days of Exercise per Week:   . Minutes of Exercise per Session:   Stress:   . Feeling of Stress :   Social Connections:   . Frequency of Communication with Friends and Family:   . Frequency of Social Gatherings with Friends and Family:   . Attends Religious Services:   . Active Member of Clubs or Organizations:   . Attends Archivist Meetings:   Marland Kitchen Marital Status:      Family History: The patient's ***family history includes Hypertension in her mother. There is no history of Heart attack, Stroke, or Diabetes. ROS:   Please see the history of present illness.    All other systems reviewed and are negative.  EKGs/Labs/Other  Studies Reviewed:    The following studies were reviewed today:  EKG:  EKG ordered today and personally reviewed.  The ekg ordered today demonstrates ***  Recent Labs: 04/24/2019: BUN 24; NT-Pro BNP 525; Potassium 4.1; Sodium 141 07/12/2019: Creatinine, Ser 1.13  Recent Lipid Panel No results found for: CHOL, TRIG, HDL, CHOLHDL, VLDL, LDLCALC, LDLDIRECT  Physical Exam:    VS:  There were no vitals taken for this visit.    Wt Readings from Last 3 Encounters:  08/29/19 134 lb 9.6 oz (61.1 kg)  07/19/19 134 lb (60.8 kg)  04/24/19 135 lb (61.2 kg)     GEN: *** Well nourished, well developed in no acute distress HEENT: Normal NECK: No JVD; No carotid bruits LYMPHATICS: No lymphadenopathy CARDIAC: ***RRR, no murmurs, rubs, gallops RESPIRATORY:  Clear to auscultation without rales, wheezing or rhonchi  ABDOMEN: Soft, non-tender, non-distended MUSCULOSKELETAL:  No edema;  No deformity  SKIN: Warm and dry NEUROLOGIC:  Alert and oriented x 3 PSYCHIATRIC:  Normal affect    Signed, Shirlee More, MD  10/24/2019 4:02 PM    [facility]

## 2019-10-25 ENCOUNTER — Ambulatory Visit: Payer: Medicare HMO | Admitting: Cardiology

## 2019-10-27 ENCOUNTER — Ambulatory Visit: Payer: Medicare HMO

## 2019-11-29 ENCOUNTER — Encounter (HOSPITAL_COMMUNITY): Payer: Medicare HMO | Admitting: Internal Medicine

## 2020-01-25 ENCOUNTER — Other Ambulatory Visit: Payer: Self-pay

## 2020-01-25 ENCOUNTER — Ambulatory Visit (HOSPITAL_COMMUNITY)
Admission: RE | Admit: 2020-01-25 | Discharge: 2020-01-25 | Disposition: A | Discharge status: Home / Self Care | Payer: Medicare HMO | Source: Ambulatory Visit | Attending: Internal Medicine | Admitting: Internal Medicine

## 2020-01-25 ENCOUNTER — Encounter (HOSPITAL_COMMUNITY): Payer: Self-pay | Admitting: Internal Medicine

## 2020-01-25 VITALS — BP 110/70 | HR 91 | Ht 59.0 in | Wt 130.4 lb

## 2020-01-25 DIAGNOSIS — Z7982 Long term (current) use of aspirin: Secondary | ICD-10-CM | POA: Insufficient documentation

## 2020-01-25 DIAGNOSIS — Z79890 Hormone replacement therapy (postmenopausal): Secondary | ICD-10-CM | POA: Insufficient documentation

## 2020-01-25 DIAGNOSIS — I252 Old myocardial infarction: Secondary | ICD-10-CM | POA: Diagnosis not present

## 2020-01-25 DIAGNOSIS — E785 Hyperlipidemia, unspecified: Secondary | ICD-10-CM | POA: Diagnosis not present

## 2020-01-25 DIAGNOSIS — I420 Dilated cardiomyopathy: Secondary | ICD-10-CM | POA: Insufficient documentation

## 2020-01-25 DIAGNOSIS — Z79899 Other long term (current) drug therapy: Secondary | ICD-10-CM | POA: Diagnosis not present

## 2020-01-25 DIAGNOSIS — Z8249 Family history of ischemic heart disease and other diseases of the circulatory system: Secondary | ICD-10-CM | POA: Diagnosis not present

## 2020-01-25 DIAGNOSIS — E039 Hypothyroidism, unspecified: Secondary | ICD-10-CM | POA: Diagnosis not present

## 2020-01-25 DIAGNOSIS — K589 Irritable bowel syndrome without diarrhea: Secondary | ICD-10-CM | POA: Diagnosis not present

## 2020-01-25 DIAGNOSIS — I110 Hypertensive heart disease with heart failure: Secondary | ICD-10-CM | POA: Diagnosis not present

## 2020-01-25 DIAGNOSIS — I5022 Chronic systolic (congestive) heart failure: Secondary | ICD-10-CM | POA: Diagnosis present

## 2020-01-25 DIAGNOSIS — Z853 Personal history of malignant neoplasm of breast: Secondary | ICD-10-CM | POA: Diagnosis not present

## 2020-01-25 DIAGNOSIS — Z9221 Personal history of antineoplastic chemotherapy: Secondary | ICD-10-CM | POA: Insufficient documentation

## 2020-01-25 DIAGNOSIS — G4733 Obstructive sleep apnea (adult) (pediatric): Secondary | ICD-10-CM | POA: Insufficient documentation

## 2020-01-25 DIAGNOSIS — Z923 Personal history of irradiation: Secondary | ICD-10-CM | POA: Diagnosis not present

## 2020-01-25 DIAGNOSIS — J45909 Unspecified asthma, uncomplicated: Secondary | ICD-10-CM | POA: Diagnosis not present

## 2020-01-25 DIAGNOSIS — E7800 Pure hypercholesterolemia, unspecified: Secondary | ICD-10-CM | POA: Diagnosis not present

## 2020-01-25 NOTE — Progress Notes (Signed)
ADVANCED HF CLINIC NOTE  Referring Physician: Bettina Gavia Primary Cardiologist: Bettina Gavia  HPI:  Nichole Johnson is a 74 y.o. female with a hx of breast cancer s/p lumpectomy/chemo (adriamycin) and XRT in 2000 ([place]), HL.and chronic systolic HF referred by Dr. Bettina Gavia in 5/21 for further evaluation of her HF.  She was diagnosed with cardiomyopathy during her chemo in 2000-2001 she has been dealing with HF for 20 years.  After moving to [place] she was initially followed by Dr. Donnetta Hutching who referred her to [facility] where she saw Dr. Loanne Drilling. Cardiac MRI at [facility] on 08/12/16 showed EF 26% with dilated left ventricle 6.2 cm mild to moderate mitral regurgitation. There was subendocardial hyperenhancement involving themid-distal anterior wall, consistent with subendocardial infarction in the distribution of a diagonal branch of the LAD. Adenosine stress perfusion imaging demonstrated no evidence of inducible myocardial ischemia.Because of hypotension he did not initiate spironolactone or Entresto.She declined ARNI/ MRA and ICD.  Med titration limited by low BP and her resistance to start new meds. cMRI repeated 3/21.   cMRI 07/12/19 1. Normal left ventricular chamber size with severely reduced systolic function. LVEF 20%. 2. Normal right ventricular chamber size, mild-moderately reduced systolic function. RVEF 34%. 3. Transmural delayed myocardial enhancement mid-apical LV anterior wall, suggestive of prior infarct, with no viability in thisdistribution. 4.  No LV apical thrombus.  We saw her for initial consult in 5/21 and discussed HF options including ICD, med titration and VAD. They decided that the did not any changes but did want to take along some information regarding VAD therapy to review further  She is here with her husband for follow-up. Says she is doing very well and does not want to change anything. We discussed SGLT2is in particular and they are concerned about volume depletion and  yeast infections. Walking a 1.5 miles on the TM at 2.3 mph (flat). No CP or undue SOB. No edema, orthopnea or PND.    Past Medical History:  Diagnosis Date  . Acute on chronic systolic congestive heart failure ([place]) 05/19/2016  . Acute renal insufficiency 06/09/2016  . AKI (acute kidney injury) ([place]) 11/03/2017  . Anemia 04/18/2014  . Benign hypertension 07/08/2016  . Breast cancer ([place]) 2001   Left Breast Cancer  . Cancer ([place])   . Cardiomyopathy due to chemotherapy ([employer]) 04/10/2018  . Dilated cardiomyopathy ([place]) 04/10/2018  . Dyspepsia 01/18/2017  . Dysplastic nevus 12/02/2016  . Glaucoma 10/14/2017  . High cholesterol   . Hyperlipidemia 04/18/2014  . Hypertension   . Hypertensive heart disease with heart failure ([place]) 04/10/2018  . Hypotension 06/09/2016  . Hypothyroidism 04/18/2014  . Irritable bowel syndrome without diarrhea 12/10/2014  . Mild diastolic dysfunction 07/18/4257   Overview:  Last Assessment & Plan:  Pt will f/u w/ Cardiology re this. Pt was given copy of recent ECHO report.  Last Assessment & Plan:  Pt will f/u w/ Cardiology re this. Pt was given copy of recent ECHO report.   . Obstructive sleep apnea (adult) (pediatric) 07/08/2016  . Personal history of radiation therapy 2001   Left Breast Cancer  . Reactive airway disease without complication 08/16/3873    Current Outpatient Medications  Medication Sig Dispense Refill  . albuterol (PROVENTIL HFA;VENTOLIN HFA) 108 (90 Base) MCG/ACT inhaler Inhale 2 puffs into the lungs every 6 (six) hours as needed for wheezing or shortness of breath. 1 Inhaler 6  . alendronate (FOSAMAX) 5 MG tablet Take 5 mg by mouth once a week. Take with a  full glass of water on an empty stomach.     . Aspirin (ASPIR-81 PO) Take by mouth every morning.    . brinzolamide (AZOPT) 1 % ophthalmic suspension Place 1 drop into both eyes 2 (two) times daily.    . Calcium Carbonate-Vitamin D (CALCIUM + D PO) Take 1 tablet by mouth 2 (two) times daily.     .  Cholecalciferol (VITAMIN D-1000 MAX ST) 25 MCG (1000 UT) tablet Take 1,000 Units by mouth daily.     . ferrous sulfate 325 (65 FE) MG tablet Take 325 mg by mouth daily.    . fluticasone (FLONASE) 50 MCG/ACT nasal spray Place 2 sprays into both nostrils daily. 16 g 2  . levothyroxine (SYNTHROID, LEVOTHROID) 100 MCG tablet Take 100 mcg by mouth daily.    Marland Kitchen lisinopril (ZESTRIL) 2.5 MG tablet Take 1 tablet (2.5 mg total) by mouth daily. 90 tablet 3  . Loratadine (CLARITIN) 10 MG CAPS Take 10 mg by mouth daily.    . metoprolol succinate (TOPROL-XL) 50 MG 24 hr tablet Take 1 tablet (50 mg total) by mouth daily. Take with or immediately following a meal. 90 tablet 2  . Multiple Vitamins-Minerals (CENTRUM SILVER PO) Take by mouth every morning.    . psyllium (METAMUCIL) 58.6 % packet Take 1 packet by mouth daily as needed.     . simvastatin (ZOCOR) 20 MG tablet simvastatin 20 mg tablet  TAKE 1 TABLET BY MOUTH ONCE DAILY AT NIGHT    . torsemide (DEMADEX) 10 MG tablet Take 1 tablet (10 mg total) by mouth daily. Take 20mg  daily if your weight has increased 3 lb above your baseline 180 tablet 2  . Travoprost, BAK Free, (TRAVATAN Z) 0.004 % SOLN ophthalmic solution Place 1 drop into both eyes at bedtime.    . vitamin C (ASCORBIC ACID) 500 MG tablet Take 500 mg by mouth daily.    . Fish Oil OIL Take 1 tablet by mouth 2 (two) times daily.  (Patient not taking: Reported on 01/25/2020)     No current facility-administered medications for this encounter.    Allergies  Allergen Reactions  . Sulfamethoxazole-Trimethoprim Other (See Comments)    Makes her shaky and sweat  . Tramadol Other (See Comments)    Makes her shaky and sweat  . Penicillins Itching and Other (See Comments)    Vulvovaginal Candidiasis with associated pruritis Vaginal Infections Vaginal Infections       Social History   Socioeconomic History  . Marital status: Married    Spouse name: Not on file  . Number of children: Not on  file  . Years of education: Not on file  . Highest education level: Not on file  Occupational History  . Not on file  Tobacco Use  . Smoking status: Never Smoker  . Smokeless tobacco: Never Used  . Tobacco comment: NEVER USED TOBACCO  Vaping Use  . Vaping Use: Never used  Substance and Sexual Activity  . Alcohol use: Yes    Alcohol/week: 0.0 standard drinks    Comment: occ  . Drug use: No  . Sexual activity: Not on file  Other Topics Concern  . Not on file  Social History Narrative  . Not on file   Social Determinants of Health   Financial Resource Strain:   . Difficulty of Paying Living Expenses: Not on file  Food Insecurity:   . Worried About Charity fundraiser in the Last Year: Not on file  . Ran Out of  Food in the Last Year: Not on file  Transportation Needs:   . Lack of Transportation (Medical): Not on file  . Lack of Transportation (Non-Medical): Not on file  Physical Activity:   . Days of Exercise per Week: Not on file  . Minutes of Exercise per Session: Not on file  Stress:   . Feeling of Stress : Not on file  Social Connections:   . Frequency of Communication with Friends and Family: Not on file  . Frequency of Social Gatherings with Friends and Family: Not on file  . Attends Religious Services: Not on file  . Active Member of Clubs or Organizations: Not on file  . Attends Archivist Meetings: Not on file  . Marital Status: Not on file  Intimate Partner Violence:   . Fear of Current or Ex-Partner: Not on file  . Emotionally Abused: Not on file  . Physically Abused: Not on file  . Sexually Abused: Not on file      Family History  Problem Relation Age of Onset  . Hypertension Mother   . Heart attack Neg Hx   . Stroke Neg Hx   . Diabetes Neg Hx     Vitals:   01/25/20 1443  BP: 110/70  Pulse: 91  SpO2: 98%  Weight: 59.1 kg (130 lb 6.4 oz)  Height: 4\' 11"  (1.499 m)    PHYSICAL EXAM: General:  Well appearing. No resp  difficulty HEENT: normal Neck: supple. no JVD. Carotids 2+ bilat; no bruits. No lymphadenopathy or thryomegaly appreciated. Cor: PMI nondisplaced. Regular rate & rhythm. No rubs, gallops or murmurs. Lungs: clear Abdomen: soft, nontender, nondistended. No hepatosplenomegaly. No bruits or masses. Good bowel sounds. Extremities: no cyanosis, clubbing, rash, edema Neuro: alert & orientedx3, cranial nerves grossly intact. moves all 4 extremities w/o difficulty. Affect pleasant   ASSESSMENT & PLAN:  1. Chronic systolic HF - given time course, suspect related to adriamycin toxicity by cMRI shows possible diagonal infarct with no ischemia on adenosine stress - she has not had a cath - remains NYHA I-II symptoms though I suspect she may be under-reporting her sx - volume status ok - currently on Toprol 50 daily and lisinopril 2.5 daily. She refuses attempts at titration or addition of sprio or SGLT2i - they are also not interested in ICD - Finally we discussed the role of RHC and/or CPX testing to further risk stratify. They will consider CPX - They will continue to f/u with Dr. Bettina Gavia and contact us if they change their minds about pursuing more aggressive HF work-up/therapy  2. H/o breast CA - s/p lumpectomy, chemo/XRT - no recurrence   Glori Bickers, MD  3:07 PM

## 2020-01-25 NOTE — Patient Instructions (Signed)
Your physician has recommended that you have a cardiopulmonary stress test (CPX). CPX testing is a non-invasive measurement of heart and lung function. It replaces a traditional treadmill stress test. This type of test provides a tremendous amount of information that relates not only to your present condition but also for future outcomes. This test combines measurements of you ventilation, respiratory gas exchange in the lungs, electrocardiogram (EKG), blood pressure and physical response before, during, and following an exercise protocol.  PLEASE CALL OUR OFFICE IF YOU WOULD LIKE TO PROCEED WITH THIS  Your physician recommends that you schedule a follow-up appointment in: AS NEEDED

## 2020-04-21 DIAGNOSIS — M85859 Other specified disorders of bone density and structure, unspecified thigh: Secondary | ICD-10-CM

## 2020-04-21 HISTORY — DX: Other specified disorders of bone density and structure, unspecified thigh: M85.859

## 2020-04-23 DIAGNOSIS — Z853 Personal history of malignant neoplasm of breast: Secondary | ICD-10-CM | POA: Insufficient documentation

## 2020-04-23 HISTORY — DX: Personal history of malignant neoplasm of breast: Z85.3

## 2020-05-07 DIAGNOSIS — C801 Malignant (primary) neoplasm, unspecified: Secondary | ICD-10-CM | POA: Insufficient documentation

## 2020-05-07 DIAGNOSIS — E7800 Pure hypercholesterolemia, unspecified: Secondary | ICD-10-CM | POA: Insufficient documentation

## 2020-05-07 DIAGNOSIS — I10 Essential (primary) hypertension: Secondary | ICD-10-CM | POA: Insufficient documentation

## 2020-05-15 NOTE — Progress Notes (Signed)
Cardiology Office Note:    Date:  05/16/2020   ID:  Nichole Johnson, DOB Feb 29, 1946, MRN [id number]  PCP:  Rudene Anda, MD  Cardiologist:  Shirlee More, MD    Referring MD: Robyne Peers, MD    ASSESSMENT:    1. Dilated cardiomyopathy ([place])   2. Hypertensive heart disease with heart failure ([place])   3. Acute on chronic systolic congestive heart failure (H[facility])    PLAN:    In order of problems listed above:  1. Despite her severe cardiomyopathy she is doing very well taken minimum dose of loop diuretic beta-blocker and a small amount ACE inhibitor New York Heart Association class I.  I have asked her to trend her blood pressures at home and contact me if she is consistently greater than 90.  Consider repeat echocardiogram around the time of her next visit.  For safety check lab work today including TSH BMP and a proBNP level.  No change in her medical treatment.   Next appointment: 6 months   Medication Adjustments/Labs and Tests Ordered: Current medicines are reviewed at length with the patient today.  Concerns regarding medicines are outlined above.  Orders Placed This Encounter  Procedures  . Basic metabolic panel  . Pro b natriuretic peptide (BNP)  . TSH   No orders of the defined types were placed in this encounter.   Chief Complaint  Patient presents with  . Follow-up  . Cardiomyopathy    History of Present Illness:    Nichole Johnson is a 75 y.o. female with a hx of dilated cardiomyopathy and hypertensive heart disease with heart failure.  She was last seen 07/19/2019.  Her cardiomyopathy is progressive due to chemotherapy.  Cardiac MR 07/17/2019 shows EF of 20% LV 34% RV.  Advise Entresto therapy has declined continues on diuretic beta-blocker and ACE has declined consideration for an ICD.  Compliance with diet, lifestyle and medications: Yes  Despite the severity of her cardiomyopathy she remains asymptomatic exercise on the treadmill daily and takes a nap in the  afternoon.  She is fully vaccinated has not had COVID-19.  She has had no cardiovascular symptoms of edema shortness of breath chest pain palpitation or syncope feels well and is not losing weight. Past Medical History:  Diagnosis Date  . Acute on chronic systolic congestive heart failure ([place]) 05/19/2016  . Acute renal insufficiency 06/09/2016  . AKI (acute kidney injury) ([place]) 11/03/2017  . Anemia 04/18/2014  . Benign hypertension 07/08/2016  . Breast cancer ([place]) 2001   Left Breast Cancer  . Cancer ([place])   . Cardiomyopathy due to chemotherapy ([place]) 04/10/2018  . Dilated cardiomyopathy ([place]) 04/10/2018  . Dyspepsia 01/18/2017  . Dysplastic nevus 12/02/2016  . Glaucoma 10/14/2017  . High cholesterol   . Hyperlipidemia 04/18/2014  . Hypertension   . Hypertensive heart disease with heart failure ([place]) 04/10/2018  . Hypotension 06/09/2016  . Hypothyroidism 04/18/2014  . Irritable bowel syndrome without diarrhea 12/10/2014  . Mild diastolic dysfunction [id number]   Overview:  Last Assessment & Plan:  Pt will f/u w/ Cardiology re this. Pt was given copy of recent ECHO report.  Last Assessment & Plan:  Pt will f/u w/ Cardiology re this. Pt was given copy of recent ECHO report.   . Obstructive sleep apnea (adult) (pediatric) 07/08/2016  . Personal history of radiation therapy 2001   Left Breast Cancer  . Reactive airway disease without complication [id number]    Past Surgical History:  Procedure Laterality Date  .  BREAST LUMPECTOMY Left 2001  . CESAREAN SECTION    . ROTATOR CUFF REPAIR Bilateral   . SHOULDER CLOSED REDUCTION      Current Medications: Current Meds  Medication Sig  . albuterol (PROVENTIL HFA;VENTOLIN HFA) 108 (90 Base) MCG/ACT inhaler Inhale 2 puffs into the lungs every 6 (six) hours as needed for wheezing or shortness of breath.  Marland Kitchen alendronate (FOSAMAX) 5 MG tablet Take 5 mg by mouth once a week. Take with a full glass of water on an empty stomach.  . Aspirin (ASPIR-81 PO) Take by  mouth every morning.  . brinzolamide (AZOPT) 1 % ophthalmic suspension Place 1 drop into both eyes 2 (two) times daily.  . Calcium Carbonate-Vitamin D (CALCIUM + D PO) Take 1 tablet by mouth 2 (two) times daily.   . Cholecalciferol 25 MCG (1000 UT) tablet Take 1,000 Units by mouth daily.   . ferrous sulfate 325 (65 FE) MG tablet Take 325 mg by mouth daily.  . fluticasone (FLONASE) 50 MCG/ACT nasal spray Place 2 sprays into both nostrils daily.  Marland Kitchen levothyroxine (SYNTHROID, LEVOTHROID) 100 MCG tablet Take 100 mcg by mouth daily.  Marland Kitchen lisinopril (ZESTRIL) 2.5 MG tablet Take 1 tablet (2.5 mg total) by mouth daily.  . Loratadine 10 MG CAPS Take 10 mg by mouth daily.  . metoprolol succinate (TOPROL-XL) 50 MG 24 hr tablet Take 1 tablet (50 mg total) by mouth daily. Take with or immediately following a meal.  . Multiple Vitamins-Minerals (CENTRUM SILVER PO) Take by mouth every morning.  . psyllium (METAMUCIL) 58.6 % packet Take 1 packet by mouth daily as needed.   . simvastatin (ZOCOR) 20 MG tablet simvastatin 20 mg tablet  TAKE 1 TABLET BY MOUTH ONCE DAILY AT NIGHT  . torsemide (DEMADEX) 10 MG tablet Take 1 tablet (10 mg total) by mouth daily. Take 20mg  daily if your weight has increased 3 lb above your baseline  . Travoprost, BAK Free, (TRAVATAN) 0.004 % SOLN ophthalmic solution Place 1 drop into both eyes at bedtime.  . vitamin C (ASCORBIC ACID) 500 MG tablet Take 500 mg by mouth daily.     Allergies:   Sulfamethoxazole-trimethoprim, Tramadol, and Penicillins   Social History   Socioeconomic History  . Marital status: Married    Spouse name: Not on file  . Number of children: Not on file  . Years of education: Not on file  . Highest education level: Not on file  Occupational History  . Not on file  Tobacco Use  . Smoking status: Never Smoker  . Smokeless tobacco: Never Used  . Tobacco comment: NEVER USED TOBACCO  Vaping Use  . Vaping Use: Never used  Substance and Sexual Activity  .  Alcohol use: Yes    Alcohol/week: 0.0 standard drinks    Comment: occ  . Drug use: No  . Sexual activity: Not on file  Other Topics Concern  . Not on file  Social History Narrative  . Not on file   Social Determinants of Health   Financial Resource Strain: Not on file  Food Insecurity: Not on file  Transportation Needs: Not on file  Physical Activity: Not on file  Stress: Not on file  Social Connections: Not on file     Family History: The patient's family history includes Hypertension in her mother. There is no history of Heart attack, Stroke, or Diabetes. ROS:   Please see the history of present illness.    All other systems reviewed and are negative.  EKGs/Labs/Other  Studies Reviewed:    The following studies were reviewed today:    Recent Labs: 07/12/2019: Creatinine, Ser 1.13  Recent Lipid Panel No results found for: CHOL, TRIG, HDL, CHOLHDL, VLDL, LDLCALC, LDLDIRECT  Physical Exam:    VS:  BP 96/60   Pulse 84   Ht 4\' 11"  (1.499 m)   Wt 135 lb (61.2 kg)   SpO2 93%   BMI 27.27 kg/m     Wt Readings from Last 3 Encounters:  05/16/20 135 lb (61.2 kg)  01/25/20 130 lb 6.4 oz (59.1 kg)  08/29/19 134 lb 9.6 oz (61.1 kg)     GEN:  Well nourished, well developed in no acute distress HEENT: Normal NECK: No JVD; No carotid bruits LYMPHATICS: No lymphadenopathy CARDIAC: Soft S1 RRR, no murmurs, rubs, gallops RESPIRATORY:  Clear to auscultation without rales, wheezing or rhonchi  ABDOMEN: Soft, non-tender, non-distended MUSCULOSKELETAL:  No edema; No deformity  SKIN: Warm and dry NEUROLOGIC:  Alert and oriented x 3 PSYCHIATRIC:  Normal affect    Signed, Shirlee More, MD  05/16/2020 11:56 AM    [place]

## 2020-05-16 ENCOUNTER — Other Ambulatory Visit: Payer: Self-pay

## 2020-05-16 ENCOUNTER — Ambulatory Visit: Payer: Medicare HMO | Admitting: Cardiology

## 2020-05-16 ENCOUNTER — Encounter: Payer: Self-pay | Admitting: Cardiology

## 2020-05-16 VITALS — BP 96/60 | HR 84 | Ht 59.0 in | Wt 135.0 lb

## 2020-05-16 DIAGNOSIS — I110 Hypertensive heart disease with heart failure: Secondary | ICD-10-CM

## 2020-05-16 DIAGNOSIS — I420 Dilated cardiomyopathy: Secondary | ICD-10-CM

## 2020-05-16 DIAGNOSIS — I5023 Acute on chronic systolic (congestive) heart failure: Secondary | ICD-10-CM

## 2020-05-16 NOTE — Patient Instructions (Signed)
Medication Instructions:  Your physician recommends that you continue on your current medications as directed. Please refer to the Current Medication list given to you today.  *If you need a refill on your cardiac medications before your next appointment, please call your pharmacy*   Lab Work: Your physician recommends that you return for lab work in: TODAY BMP, ProBNP, TSH If you have labs (blood work) drawn today and your tests are completely normal, you will receive your results only by: Marland Kitchen MyChart Message (if you have MyChart) OR . A paper copy in the mail If you have any lab test that is abnormal or we need to change your treatment, we will call you to review the results.   Testing/Procedures: None   Follow-Up: At [facility], you and your health needs are our priority.  As part of our continuing mission to provide you with exceptional heart care, we have created designated Provider Care Teams.  These Care Teams include your primary Cardiologist (physician) and Advanced Practice Providers (APPs -  Physician Assistants and Nurse Practitioners) who all work together to provide you with the care you need, when you need it.  We recommend signing up for the patient portal called "MyChart".  Sign up information is provided on this After Visit Summary.  MyChart is used to connect with patients for Virtual Visits (Telemedicine).  Patients are able to view lab/test results, encounter notes, upcoming appointments, etc.  Non-urgent messages can be sent to your provider as well.   To learn more about what you can do with MyChart, go to [email or web address].    Your next appointment:   6 month(s)  The format for your next appointment:   In Person  Provider:   Shirlee More, MD   Other Instructions Please call if your blood pressures are consistently less than 90.

## 2020-05-17 ENCOUNTER — Telehealth: Payer: Self-pay

## 2020-05-17 LAB — BASIC METABOLIC PANEL
BUN/Creatinine Ratio: 23 (ref 12–28)
BUN: 24 mg/dL (ref 8–27)
CO2: 25 mmol/L (ref 20–29)
Calcium: 10 mg/dL (ref 8.7–10.3)
Chloride: 98 mmol/L (ref 96–106)
Creatinine, Ser: 1.05 mg/dL — ABNORMAL HIGH (ref 0.57–1.00)
GFR calc Af Amer: 60 mL/min/{1.73_m2} (ref >59)
GFR calc non Af Amer: 52 mL/min/{1.73_m2} — ABNORMAL LOW (ref >59)
Glucose: 96 mg/dL (ref 65–99)
Potassium: 4 mmol/L (ref 3.5–5.2)
Sodium: 140 mmol/L (ref 134–144)

## 2020-05-17 LAB — PRO B NATRIURETIC PEPTIDE: NT-Pro BNP: 788 pg/mL — ABNORMAL HIGH (ref 0–738)

## 2020-05-17 LAB — TSH: TSH: 8.05 u[IU]/mL — ABNORMAL HIGH (ref 0.450–4.500)

## 2020-05-17 NOTE — Telephone Encounter (Signed)
-----   Message from Richardo Priest, MD sent at 05/17/2020  7:57 AM EST ----- Kidney function is normal  Her TSH is mildly elevated please route a copy to her PCP and she should discuss whether she needs a change in thyroid hormone.

## 2020-05-17 NOTE — Telephone Encounter (Signed)
Spoke with patient regarding results and recommendation.  Patient verbalizes understanding and is agreeable to plan of care. Advised patient to call back with any issues or concerns.  

## 2020-05-23 IMAGING — US ULTRASOUND LEFT BREAST LIMITED
1 series · 6 of 6 positions shown · non-contrast
Comparison: Previous exam(s).

CLINICAL DATA: Patient presents for a diagnostic left breast
examination due to a palpable abnormality over the past few weeks in
the outer mid to lower left breast. Patient states she can feel this
abnormality at times and unable to feel it at other times. History
of previous malignant left lumpectomy 2555.

EXAM:
DIGITAL DIAGNOSTIC left MAMMOGRAM WITH CAD AND TOMO
ULTRASOUND left BREAST

[Series 1: ultrasound left breast limited · 0.07mm/px · 6 of 6 slices shown]
[im 1/6]
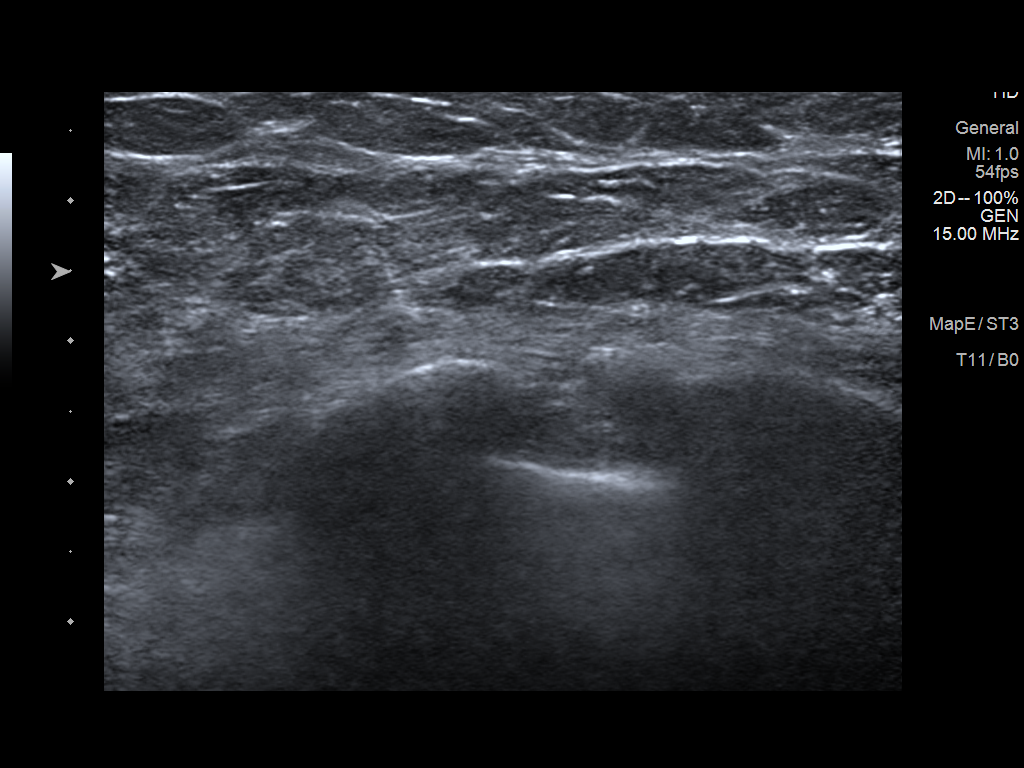
[im 2/6]
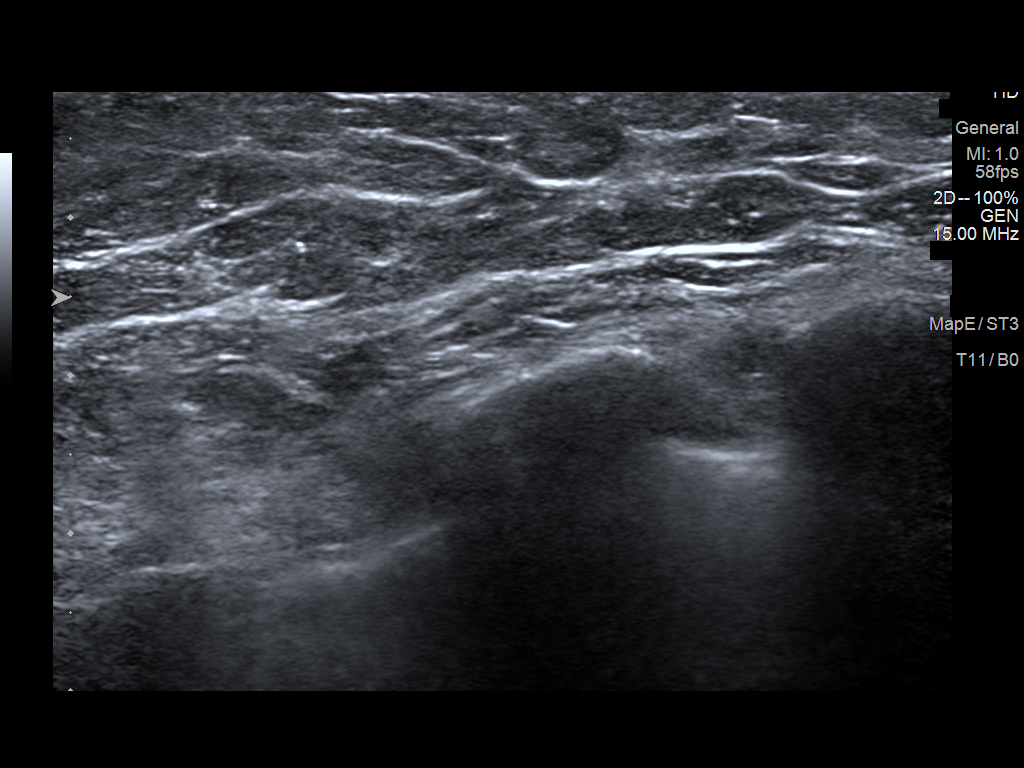
[im 3/6]
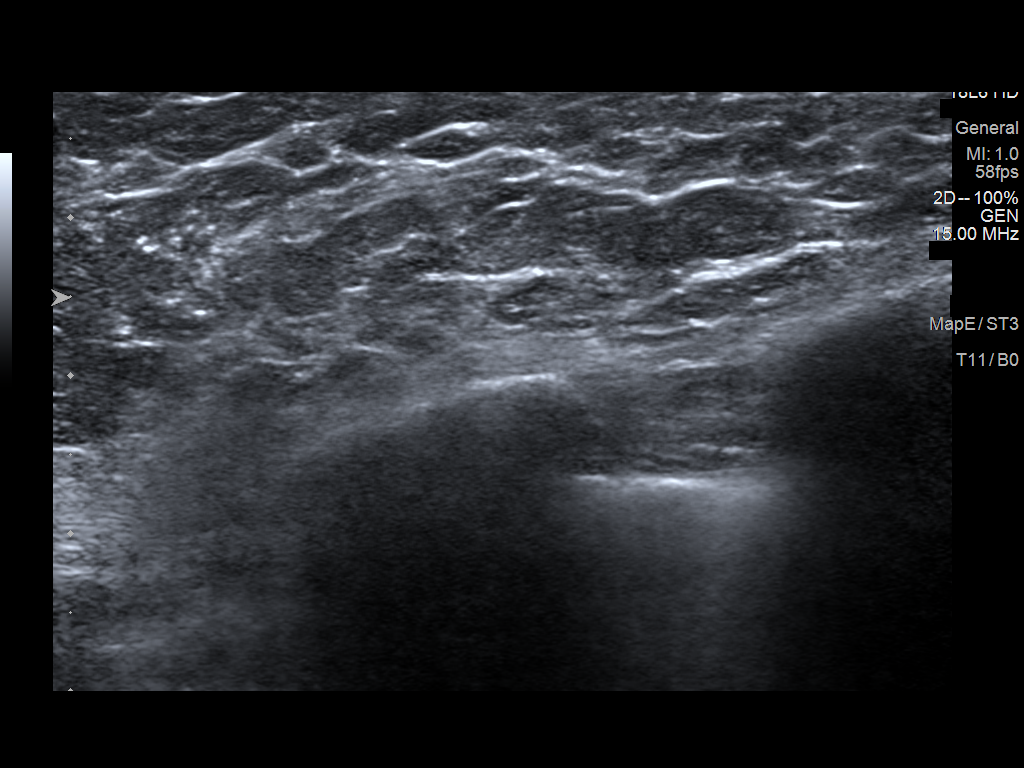
[im 4/6]
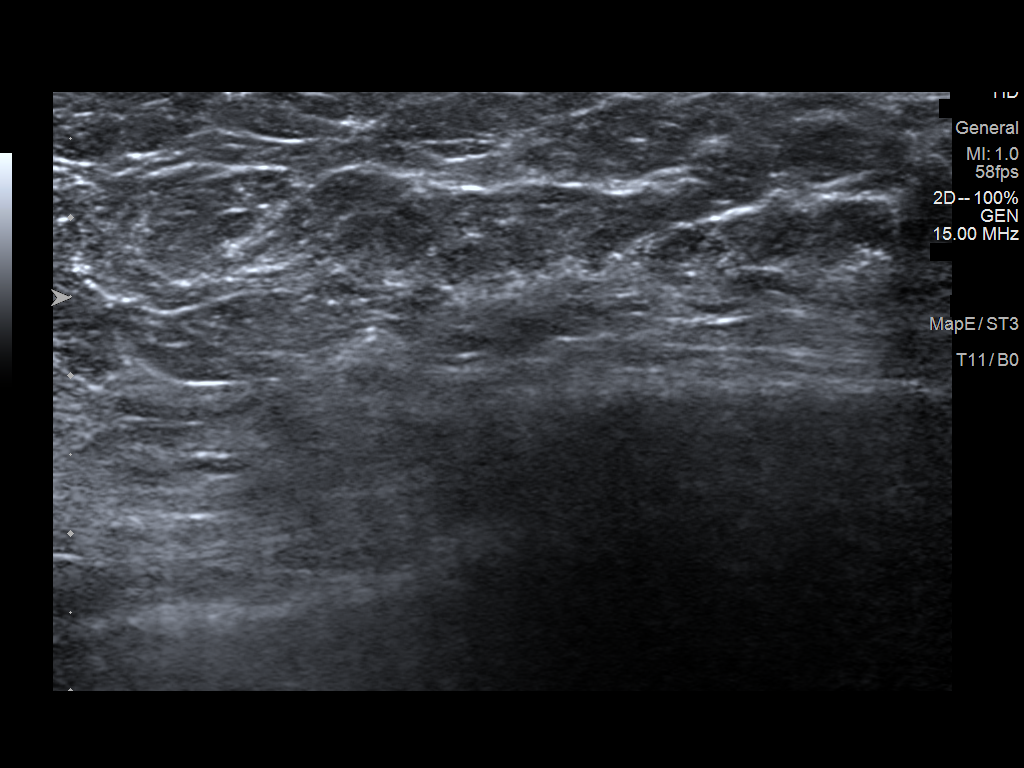
[im 5/6]
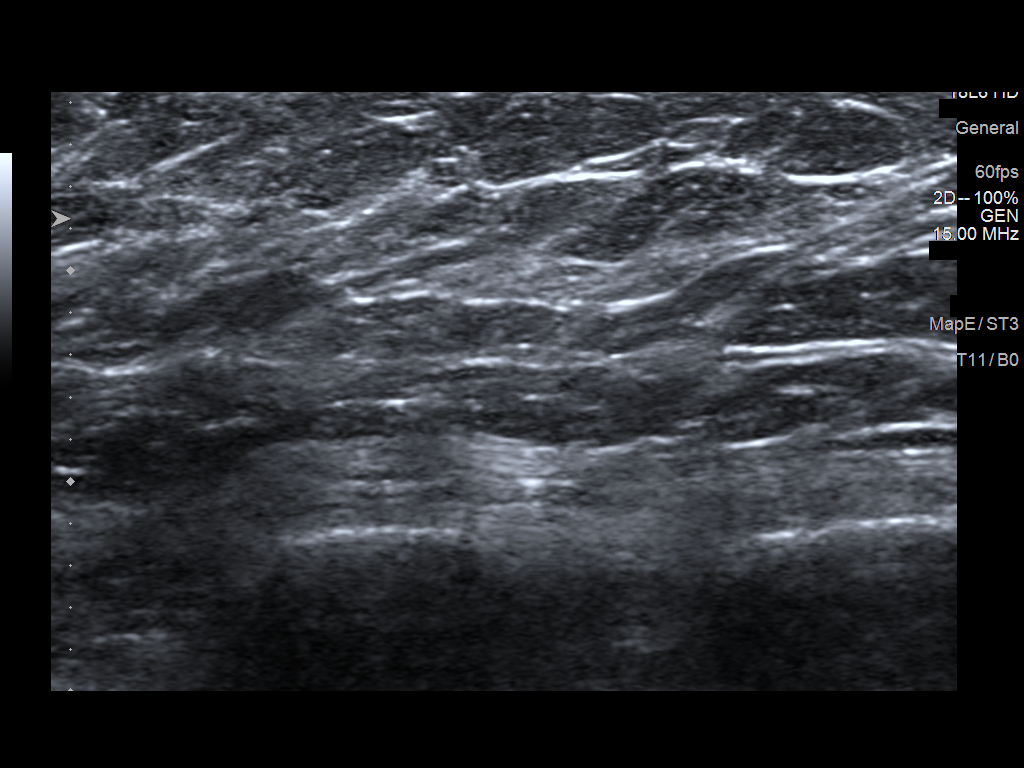
[im 6/6]
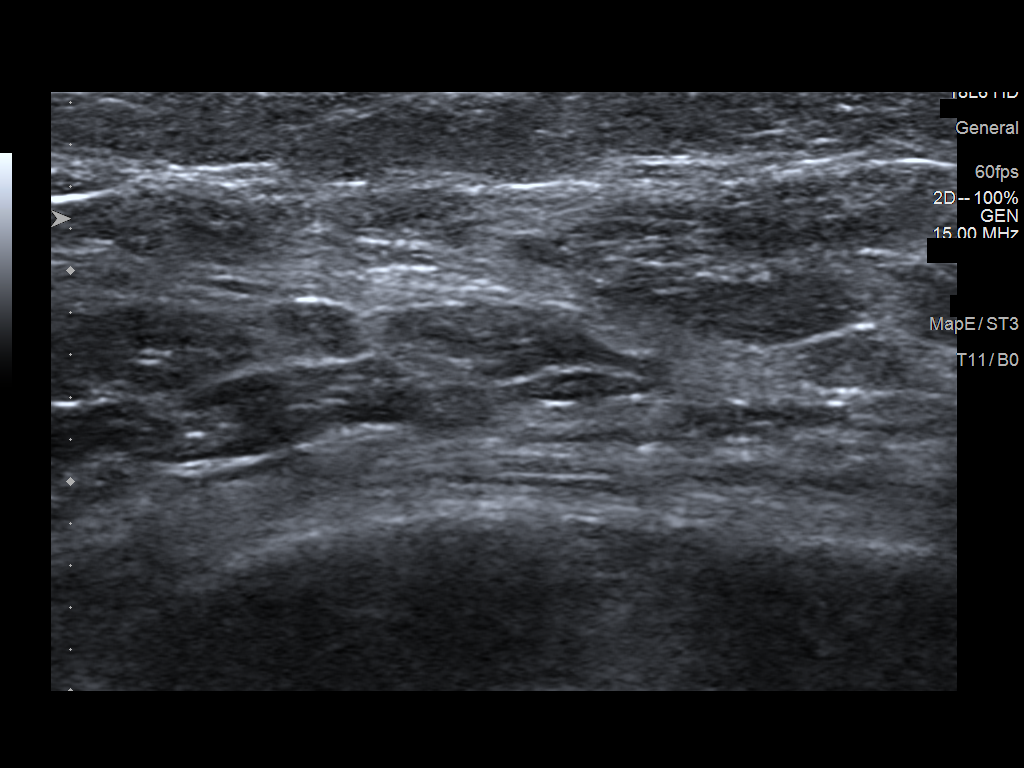

[6 of 6 positions shown; findings below may reference images not displayed]

ACR Breast Density Category b: There are scattered areas of
fibroglandular density.
FINDINGS: Exam demonstrates stable post lumpectomy changes over the upper
central left breast. There is mild density over the mid to upper
breast in the posterior third on the MLO view without significant
change from prior exams in the area patient's palpable abnormality.
The area patient's palpable abnormality cannot be visualized on the
CC nor spot image as is too far lateral to be included in the field
of view. Remainder of the left breast is unchanged.

Mammographic images were processed with CAD.

On physical exam, I palpate no definite focal abnormality over the
outer mid to lower left breast.

Targeted ultrasound is performed, showing no focal abnormality over
the outer mid to lower left breast to account for patient's palpable
abnormality.
IMPRESSION: No focal abnormality over the outer mid to lower left breast to
account for patient's palpable abnormality.

RECOMMENDATION:
Recommend continued management of patient's left breast palpable
abnormality on a clinical basis. Otherwise, recommend continued
annual bilateral screening mammographic follow-up as patient's next
annual bilateral mammogram is due in June 2018.

I have discussed the findings and recommendations with the patient.
Results were also provided in writing at the conclusion of the
visit. If applicable, a reminder letter will be sent to the patient
regarding the next appointment.

BI-RADS CATEGORY  1: Negative.

## 2020-05-23 IMAGING — MG DIGITAL DIAGNOSTIC UNILATERAL LEFT MAMMOGRAM WITH TOMO AND CAD
6 series · 6 of 18 positions shown · non-contrast
Comparison: Previous exam(s).

CLINICAL DATA: Patient presents for a diagnostic left breast
examination due to a palpable abnormality over the past few weeks in
the outer mid to lower left breast. Patient states she can feel this
abnormality at times and unable to feel it at other times. History
of previous malignant left lumpectomy 2555.

EXAM:
DIGITAL DIAGNOSTIC left MAMMOGRAM WITH CAD AND TOMO
ULTRASOUND left BREAST

[L CC synth-2D]
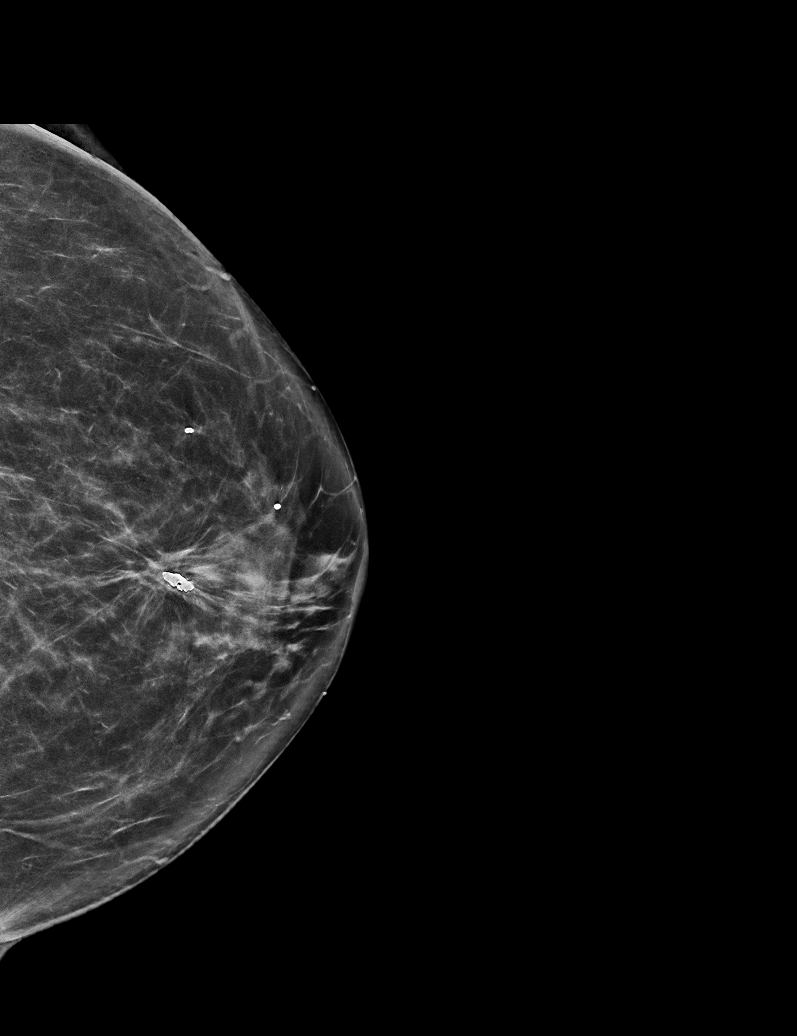

[L TAN synth-2D]
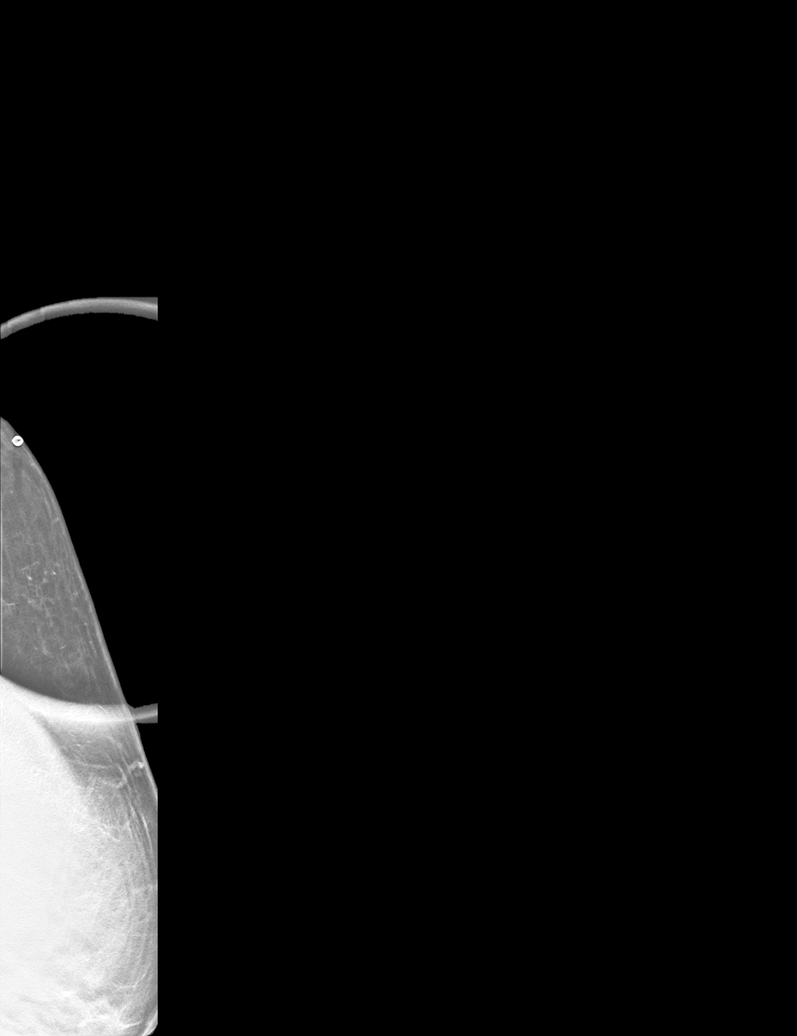

[L MLO synth-2D]
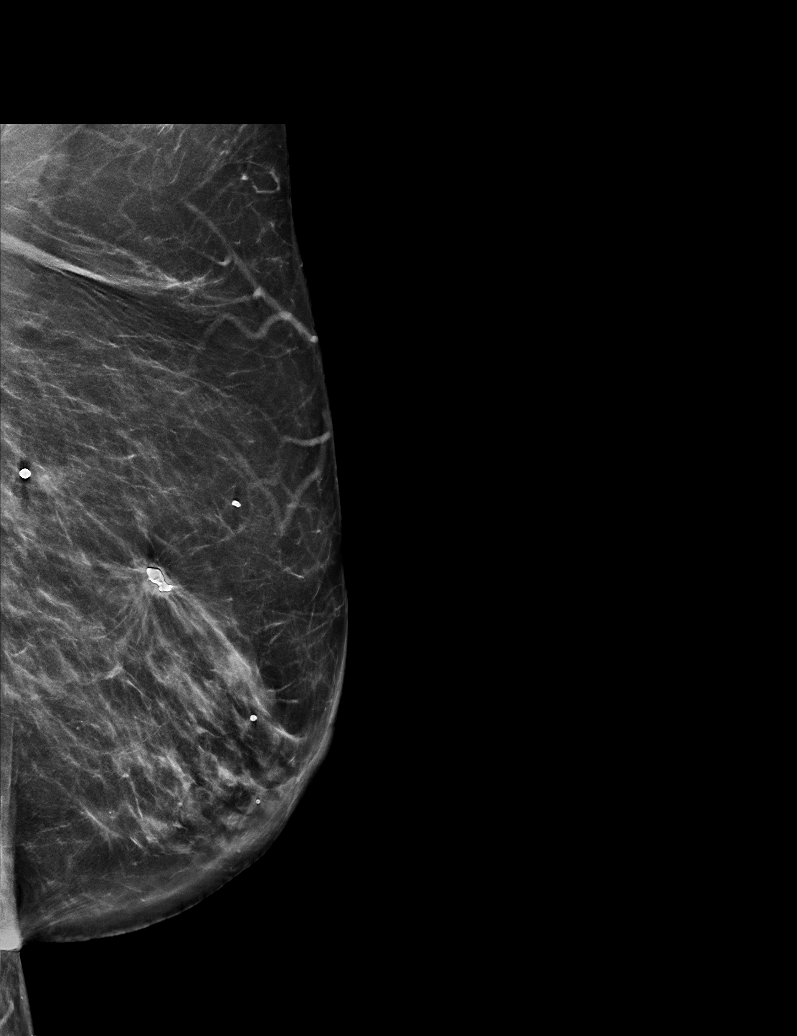

[L MLO tomo · tomo slice 34/67.0]
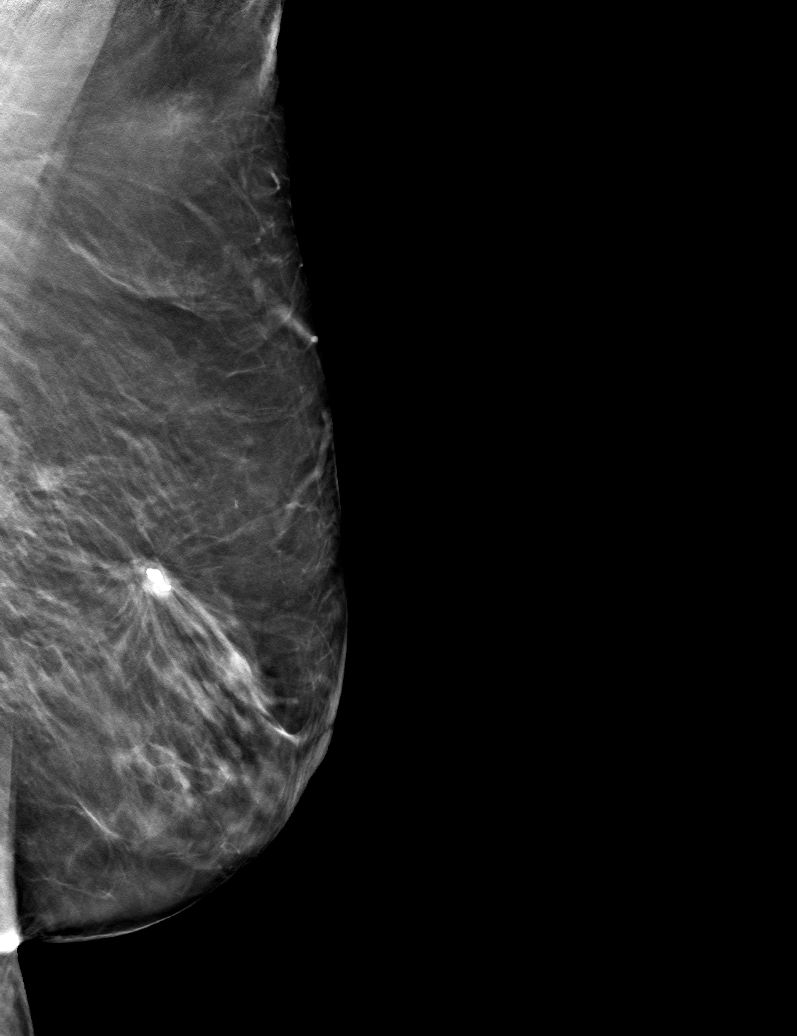

[L CC tomo · tomo slice 31/62.0]
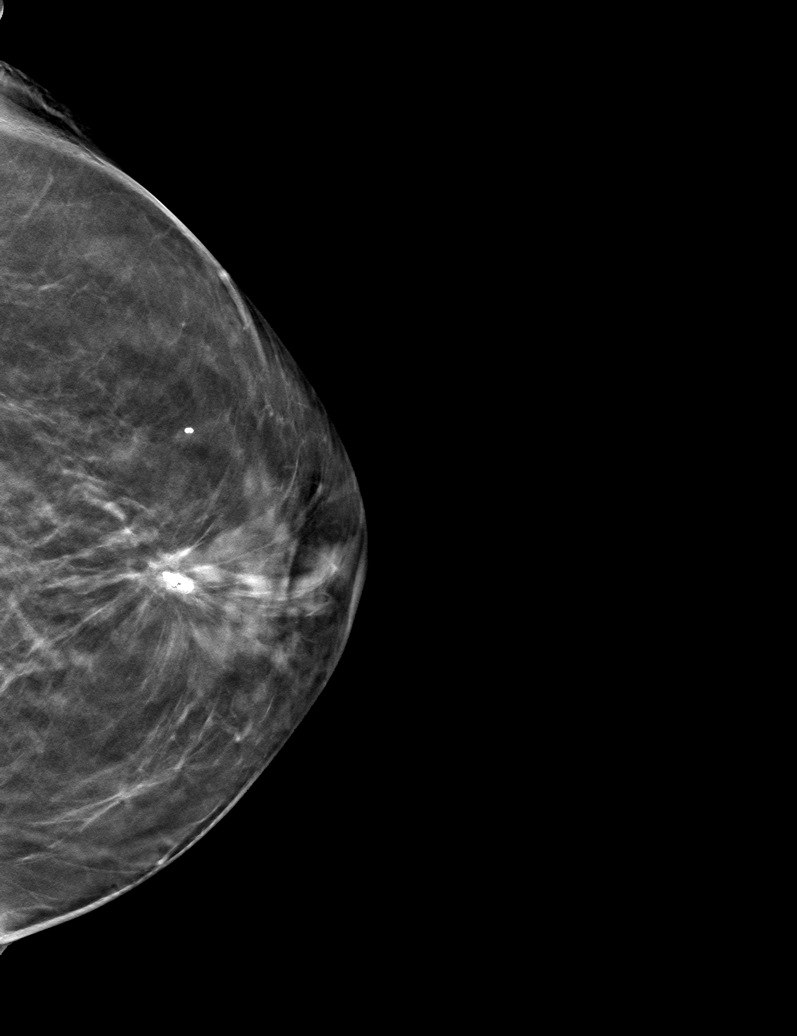

[L TAN tomo · tomo slice 16/31.0]
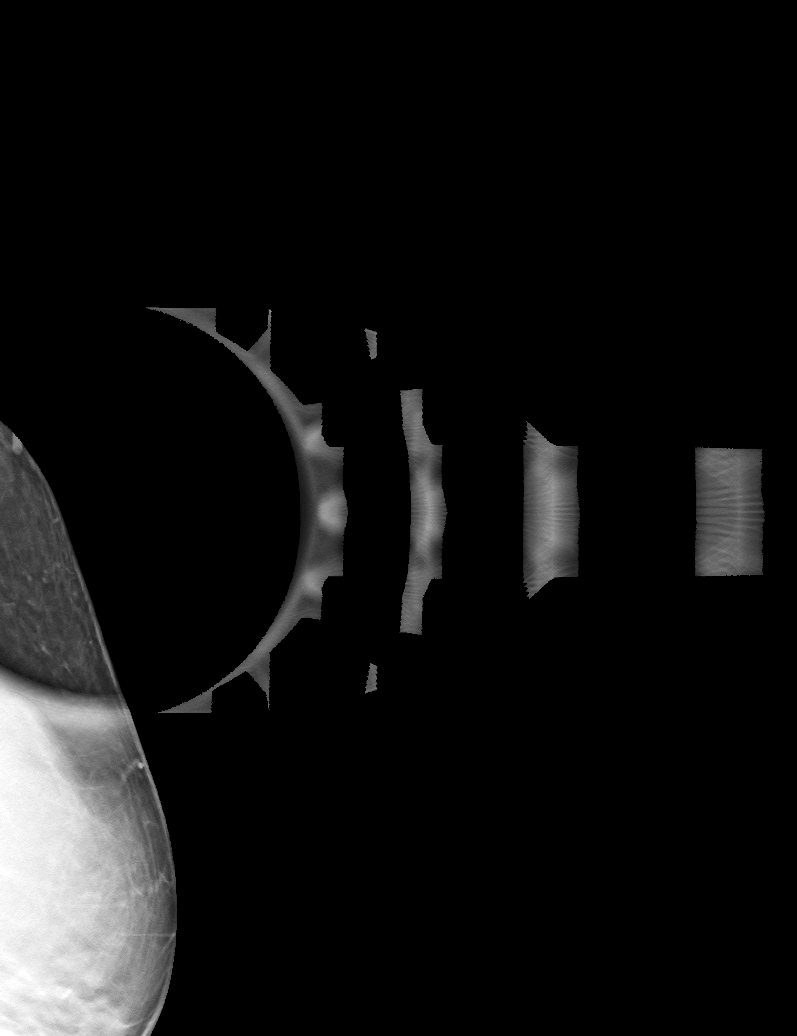

[6 of 18 positions shown; findings below may reference images not displayed]

ACR Breast Density Category b: There are scattered areas of
fibroglandular density.
FINDINGS: Exam demonstrates stable post lumpectomy changes over the upper
central left breast. There is mild density over the mid to upper
breast in the posterior third on the MLO view without significant
change from prior exams in the area patient's palpable abnormality.
The area patient's palpable abnormality cannot be visualized on the
CC nor spot image as is too far lateral to be included in the field
of view. Remainder of the left breast is unchanged.

Mammographic images were processed with CAD.

On physical exam, I palpate no definite focal abnormality over the
outer mid to lower left breast.

Targeted ultrasound is performed, showing no focal abnormality over
the outer mid to lower left breast to account for patient's palpable
abnormality.
IMPRESSION: No focal abnormality over the outer mid to lower left breast to
account for patient's palpable abnormality.

RECOMMENDATION:
Recommend continued management of patient's left breast palpable
abnormality on a clinical basis. Otherwise, recommend continued
annual bilateral screening mammographic follow-up as patient's next
annual bilateral mammogram is due in June 2018.

I have discussed the findings and recommendations with the patient.
Results were also provided in writing at the conclusion of the
visit. If applicable, a reminder letter will be sent to the patient
regarding the next appointment.

BI-RADS CATEGORY  1: Negative.

## 2020-06-19 ENCOUNTER — Other Ambulatory Visit: Payer: Self-pay | Admitting: Cardiology

## 2020-06-19 NOTE — Telephone Encounter (Signed)
Metoprolol approved and sent 

## 2020-08-13 DIAGNOSIS — G5761 Lesion of plantar nerve, right lower limb: Secondary | ICD-10-CM | POA: Insufficient documentation

## 2020-08-13 HISTORY — DX: Lesion of plantar nerve, right lower limb: G57.61

## 2020-08-15 ENCOUNTER — Other Ambulatory Visit: Payer: Self-pay | Admitting: Cardiology

## 2020-08-16 NOTE — Telephone Encounter (Signed)
Lisinopril 2.5 mg # 90 x 3 refills and Torsemide 10 mg # 180 x 1  Sent to pharmacy per request

## 2020-11-11 NOTE — Progress Notes (Signed)
Cardiology Office Note:    Date:  11/12/2020   ID:  Nichole Johnson, DOB 1945/09/23, MRN [id number]  PCP:  Rudene Anda, MD  Cardiologist:  Shirlee More, MD    Referring MD: Rudene Anda, MD    ASSESSMENT:    1. Chronic systolic congestive heart failure ([place])   2. Hypertensive heart disease with heart failure ([place])   3. Dilated cardiomyopathy ([place])   4. Acute on chronic combined systolic and diastolic CHF (congestive heart failure) ([place])   5. Hyperlipidemia, unspecified hyperlipidemia type    PLAN:    In order of problems listed above:  She is not doing as well with symptoms of vascular congestion increase torsemide 20 mg daily check labs including electrolytes with her severe GI diarrhea illness proBNP level reassess in the office in 1 month I asked her to consider starting an SGLT2 inhibitor and recheck ejection fraction.  She is interested in total knee replacement and if her EF is stable and heart failure compensated I think she is an acceptable candidate Check CMP lipid profile on statin   Next appointment: 1 month   Medication Adjustments/Labs and Tests Ordered: Current medicines are reviewed at length with the patient today.  Concerns regarding medicines are outlined above.  Orders Placed This Encounter  Procedures   Comprehensive metabolic panel   Lipid panel   Pro b natriuretic peptide (BNP)   EKG 12-Lead   ECHOCARDIOGRAM COMPLETE   Meds ordered this encounter  Medications   DISCONTD: torsemide (DEMADEX) 20 MG tablet    Sig: Take 1 tablet (20 mg total) by mouth daily.    Dispense:  90 tablet    Refill:  3   torsemide (DEMADEX) 20 MG tablet    Sig: Take 1 tablet (20 mg total) by mouth daily.    Dispense:  90 tablet    Refill:  3    Chief Complaint  Patient presents with   Follow-up   Congestive Heart Failure    History of Present Illness:    Nichole Johnson is a 75 y.o. female with a hx of dilated cardiomyopathy and hypertensive heart disease with heart failure.   She was last seen 07/19/2019.  Her cardiomyopathy is progressive due to chemotherapy.  Cardiac MR 07/17/2019 shows EF of 20% LV 34% RV  last seen 05/16/2020.  Compliance with diet, lifestyle and medications: Yes  She is not doing quite as well her weight is unchanged but she is without orthopnea and notices edema. She traveled to [place] and was quite ill with a GI illness diarrhea had lightheadedness associated with it and has not had her electrolytes checked Despite edema and orthopnea she goes to the gym exercises is not having exertional shortness of breath palpitations syncope or chest pain. I discussed guideline directed therapy for chronic heart failure advised her she needs a higher dose of diuretic she agrees she should be on an SGLT2 inhibitor she said she will think about it and does not want to transition from lisinopril to [place]. 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 Pt was given copy of recent ECHO report.  Last Assessment & Plan:  Pt will f/u w/ Cardiology re this. Pt was given copy of recent ECHO report.    Obstructive sleep apnea (adult) (pediatric) 07/08/2016   Personal history of radiation therapy 2001    Left Breast Cancer   Reactive airway disease without complication [id number]    Past Surgical History:  Procedure Laterality Date   BREAST LUMPECTOMY Left 2001   CESAREAN SECTION     ROTATOR CUFF REPAIR Bilateral    SHOULDER CLOSED REDUCTION      Current Medications: Current Meds  Medication Sig   albuterol (PROVENTIL HFA;VENTOLIN HFA) 108 (90 Base) MCG/ACT inhaler Inhale 2 puffs into the lungs every 6 (six) hours as needed for wheezing or shortness of breath.   alendronate (FOSAMAX) 5 MG tablet Take 5 mg by mouth once a week. Take with a full glass of water on an empty stomach.   Aspirin (ASPIR-81 PO) Take by mouth every morning.   brinzolamide (AZOPT) 1 % ophthalmic suspension Place 1 drop into both eyes 2 (two) times daily.   Calcium Carbonate-Vitamin D (CALCIUM + D PO) Take 1 tablet by mouth 2 (two) times daily.    Cholecalciferol 25 MCG (1000 UT) tablet Take 1,000 Units by mouth daily.    ferrous sulfate 325 (65 FE) MG tablet Take 325 mg by mouth daily.   fluticasone (FLONASE) 50 MCG/ACT nasal spray Place 2 sprays into both nostrils daily.   levothyroxine (SYNTHROID, LEVOTHROID) 100 MCG tablet Take 100 mcg by mouth daily.   lisinopril (ZESTRIL) 2.5 MG tablet TAKE 1 TABLET EVERY DAY   Loratadine 10 MG CAPS Take 10 mg by mouth daily.   metoprolol succinate (TOPROL-XL) 50 MG 24 hr tablet TAKE 1 TABLET (50 MG TOTAL) BY MOUTH DAILY. TAKE WITH OR IMMEDIATELY FOLLOWING A MEAL.   Multiple Vitamins-Minerals (CENTRUM SILVER PO) Take by mouth every morning.   psyllium (METAMUCIL) 58.6 % packet Take 1 packet by mouth daily as needed.    simvastatin (ZOCOR) 20 MG tablet simvastatin 20 mg tablet  TAKE 1 TABLET BY MOUTH ONCE DAILY AT NIGHT   Travoprost, BAK Free, (TRAVATAN) 0.004 % SOLN ophthalmic solution Place 1 drop into both eyes at bedtime.   vitamin C (ASCORBIC ACID) 500 MG tablet Take 500 mg by mouth daily.   [DISCONTINUED] torsemide (DEMADEX) 10 MG tablet TAKE 1 TABLET DAILY. TAKE 2  TABLETS DAILY IF YOUR WEIGHT HAS INCREASED 3 POUNDS ABOVE YOUR BASELINE   [DISCONTINUED] torsemide (DEMADEX) 20 MG tablet Take 1 tablet (20 mg total) by mouth daily.     Allergies:   Sulfamethoxazole-trimethoprim, Tramadol, and Penicillins   Social History   Socioeconomic History   Marital status: Married    Spouse name: Not on file   Number of children: Not on file   Years of education: Not on file   Highest education level: Not on file  Occupational History   Not on file  Tobacco Use   Smoking status: Never   Smokeless tobacco: Never   Tobacco comments:    NEVER USED TOBACCO  Vaping Use   Vaping Use: Never used  Substance and Sexual Activity   Alcohol use: Yes    Alcohol/week: 0.0 standard drinks    Comment: occ   Drug use: No   Sexual activity: Not on file  Other Topics Concern   Not on file  Social History Narrative   Not on file   Social Determinants of Health   Financial Resource Strain: Not on file  Food Insecurity: Not on file  Transportation Needs: Not on file  Physical Activity: Not on file  Stress: Not on file  Social Connections: Not on file     Family History: The patient's family history includes Hypertension in her mother. There is no history of Heart attack, Stroke, or Diabetes. ROS:   Please see the history of present illness.    All other systems reviewed and are negative.  EKGs/Labs/Other Studies Reviewed:    The following studies were reviewed today:  EKG:  EKG ordered today and personally reviewed.  The ekg ordered today demonstrates sinus rhythm left anterior hemiblock  Recent Labs: 05/16/2020: BUN 24; Creatinine, Ser 1.05; NT-Pro BNP 788; Potassium 4.0; Sodium 140; TSH 8.050  Recent Lipid Panel No results found for: CHOL, TRIG, HDL, CHOLHDL, VLDL, LDLCALC, LDLDIRECT  Physical Exam:    VS:  BP 115/74 (BP Location: Right Arm, Patient Position: Sitting, Cuff Size: Normal)   Pulse 88   Ht '4\' 11"'$  (1.499 m)   Wt 133 lb (60.3 kg)   SpO2  98%   BMI 26.86 kg/m     Wt Readings from Last 3 Encounters:  11/12/20 133 lb (60.3 kg)  05/16/20 135 lb (61.2 kg)  01/25/20 130 lb 6.4 oz (59.1 kg)     GEN:  Well nourished, well developed in no acute distress HEENT: Normal NECK: No JVD; No carotid bruits LYMPHATICS: No lymphadenopathy CARDIAC: Soft S1 RRR, no murmurs, rubs, gallops RESPIRATORY:  Clear to auscultation without rales, wheezing or rhonchi  ABDOMEN: Soft, non-tender, non-distended MUSCULOSKELETAL: She has 1+ bilateral lower extremity pitting edema; No deformity  SKIN: Warm and dry NEUROLOGIC:  Alert and oriented x 3 PSYCHIATRIC:  Normal affect    Signed, Shirlee More, MD  11/12/2020 12:47 PM    [facility]

## 2020-11-12 ENCOUNTER — Encounter: Payer: Self-pay | Admitting: Cardiology

## 2020-11-12 ENCOUNTER — Ambulatory Visit: Payer: Medicare HMO | Admitting: Cardiology

## 2020-11-12 ENCOUNTER — Other Ambulatory Visit: Payer: Self-pay

## 2020-11-12 VITALS — BP 115/74 | HR 88 | Ht 59.0 in | Wt 133.0 lb

## 2020-11-12 DIAGNOSIS — I5043 Acute on chronic combined systolic (congestive) and diastolic (congestive) heart failure: Secondary | ICD-10-CM | POA: Diagnosis not present

## 2020-11-12 DIAGNOSIS — I420 Dilated cardiomyopathy: Secondary | ICD-10-CM | POA: Diagnosis not present

## 2020-11-12 DIAGNOSIS — I110 Hypertensive heart disease with heart failure: Secondary | ICD-10-CM | POA: Diagnosis not present

## 2020-11-12 DIAGNOSIS — I5022 Chronic systolic (congestive) heart failure: Secondary | ICD-10-CM | POA: Diagnosis not present

## 2020-11-12 DIAGNOSIS — E785 Hyperlipidemia, unspecified: Secondary | ICD-10-CM

## 2020-11-12 MED ORDER — TORSEMIDE 20 MG PO TABS: 20.0000 mg | ORAL_TABLET | Freq: Every day | ORAL | 3 refills | Status: DC

## 2020-11-12 NOTE — Patient Instructions (Signed)
Medication Instructions:  Your physician has recommended you make the following change in your medication:  CHANGE TORSEMIDE TO '20MG'$  ONCE DAILY  *If you need a refill on your cardiac medications before your next appointment, please call your pharmacy*   Lab Work: Your physician recommends that you return for lab work in: [place], LIPID PROFILE AND PRO BNP  If you have labs (blood work) drawn today and your tests are completely normal, you will receive your results only by: [place] (if you have MyChart) OR A paper copy in the mail If you have any lab test that is abnormal or we need to change your treatment, we will call you to review the results.   Testing/Procedures: Your physician has requested that you have an echocardiogram. Echocardiography is a painless test that uses sound waves to create images of your heart. It provides your doctor with information about the size and shape of your heart and how well your heart's chambers and valves are working. This procedure takes approximately one hour. There are no restrictions for this procedure.    Follow-Up: At [facility], you and your health needs are our priority.  As part of our continuing mission to provide you with exceptional heart care, we have created designated Provider Care Teams.  These Care Teams include your primary Cardiologist (physician) and Advanced Practice Providers (APPs -  Physician Assistants and Nurse Practitioners) who all work together to provide you with the care you need, when you need it.  We recommend signing up for the patient portal called "MyChart".  Sign up information is provided on this After Visit Summary.  MyChart is used to connect with patients for Virtual Visits (Telemedicine).  Patients are able to view lab/test results, encounter notes, upcoming appointments, etc.  Non-urgent messages can be sent to your provider as well.   To learn more about what you can do with  MyChart, go to [email or web address].    Your next appointment:   1 month(s)  The format for your next appointment:   In Person  Provider:   Shirlee More, MD   Other Instructions

## 2020-11-13 ENCOUNTER — Telehealth: Payer: Self-pay

## 2020-11-13 LAB — LIPID PANEL
Chol/HDL Ratio: 2.7 ratio (ref 0.0–4.4)
Cholesterol, Total: 159 mg/dL (ref 100–199)
HDL: 58 mg/dL (ref >39)
LDL Chol Calc (NIH): 71 mg/dL (ref 0–99)
Triglycerides: 177 mg/dL — ABNORMAL HIGH (ref 0–149)
VLDL Cholesterol Cal: 30 mg/dL (ref 5–40)

## 2020-11-13 LAB — COMPREHENSIVE METABOLIC PANEL
ALT: 11 IU/L (ref 0–32)
AST: 16 IU/L (ref 0–40)
Albumin/Globulin Ratio: 2.1 (ref 1.2–2.2)
Albumin: 4.7 g/dL (ref 3.7–4.7)
Alkaline Phosphatase: 63 IU/L (ref 44–121)
BUN/Creatinine Ratio: 18 (ref 12–28)
BUN: 17 mg/dL (ref 8–27)
Bilirubin Total: 0.6 mg/dL (ref 0.0–1.2)
CO2: 26 mmol/L (ref 20–29)
Calcium: 9.9 mg/dL (ref 8.7–10.3)
Chloride: 102 mmol/L (ref 96–106)
Creatinine, Ser: 0.97 mg/dL (ref 0.57–1.00)
Globulin, Total: 2.2 g/dL (ref 1.5–4.5)
Glucose: 97 mg/dL (ref 65–99)
Potassium: 4.7 mmol/L (ref 3.5–5.2)
Sodium: 143 mmol/L (ref 134–144)
Total Protein: 6.9 g/dL (ref 6.0–8.5)
eGFR: 60 mL/min/{1.73_m2} (ref >59)

## 2020-11-13 LAB — PRO B NATRIURETIC PEPTIDE: NT-Pro BNP: 1293 pg/mL — ABNORMAL HIGH (ref 0–738)

## 2020-11-13 NOTE — Telephone Encounter (Signed)
Spoke with patient regarding results and recommendation.  Patient verbalizes understanding and is agreeable to plan of care. Advised patient to call back with any issues or concerns.  

## 2020-11-13 NOTE — Telephone Encounter (Signed)
-----   Message from Nichole Priest, MD sent at 11/13/2020  7:47 AM EDT ----- Her kidney function was normal lipids are normal  Laboratory test for heart failure is significantly elevated I increased her diuretic yesterday and I asked her to consider taking farxiga and Ozempic.  Improve the quality of her life and change the course of her heart appropriately I hope she reconsidered.

## 2020-11-25 ENCOUNTER — Encounter (HOSPITAL_COMMUNITY): Payer: Self-pay | Admitting: Cardiology

## 2020-11-25 ENCOUNTER — Ambulatory Visit (HOSPITAL_COMMUNITY): Payer: Medicare HMO | Attending: Internal Medicine

## 2020-12-13 ENCOUNTER — Ambulatory Visit: Payer: Medicare HMO | Admitting: Cardiology

## 2020-12-18 ENCOUNTER — Ambulatory Visit (HOSPITAL_COMMUNITY): Payer: Medicare HMO | Attending: Cardiovascular Disease

## 2020-12-18 ENCOUNTER — Other Ambulatory Visit: Payer: Self-pay

## 2020-12-18 DIAGNOSIS — I5022 Chronic systolic (congestive) heart failure: Secondary | ICD-10-CM | POA: Insufficient documentation

## 2020-12-18 LAB — ECHOCARDIOGRAM COMPLETE
Area-P 1/2: 5.46 cm2
S' Lateral: 4.1 cm
Single Plane A4C EF: 30.6 %

## 2020-12-18 MED ORDER — PERFLUTREN LIPID MICROSPHERE
1.0000 mL | INTRAVENOUS | Status: AC | PRN
  Administered 2020-12-18: 2 mL via INTRAVENOUS

## 2020-12-19 ENCOUNTER — Telehealth: Payer: Self-pay

## 2020-12-19 NOTE — Telephone Encounter (Signed)
-----   Message from Richardo Priest, MD sent at 12/19/2020 10:16 AM EDT ----- Regarding: FW: EF remains abnormal but not as severe as previous by MRI Can discuss at Fu ----- Message ----- From: Interface, [place] Sent: 12/18/2020   5:50 PM EDT To: Richardo Priest, MD

## 2020-12-19 NOTE — Telephone Encounter (Signed)
Spoke with patient regarding results and recommendation.  Patient verbalizes understanding and is agreeable to plan of care. Advised patient to call back with any issues or concerns.  

## 2021-01-09 ENCOUNTER — Ambulatory Visit: Payer: Medicare HMO | Admitting: Cardiology

## 2021-02-05 NOTE — Progress Notes (Signed)
Cardiology Office Note:    Date:  02/06/2021   ID:  Nichole Johnson, DOB 10/30/1945, MRN [id number]  PCP:  Rudene Anda, MD  Cardiologist:  Shirlee More, MD    Referring MD: Rudene Anda, MD    ASSESSMENT:    1. Dilated cardiomyopathy ([name])   2. Chronic systolic congestive heart failure ([name])   3. Hypertensive heart disease with heart failure ([name])   4. Hyperlipidemia, unspecified hyperlipidemia type    PLAN:    In order of problems listed above:  Cardiomyopathy and heart failure is stable she has no fluid overload she will continue her current loop diuretic New York Heart Association class II and continue her medical therapy including her beta-blocker and ACE inhibitor she has opted not to accept Entresto or SGLT2 inhibitors.  My opinion she is an acceptable candidate for total knee arthroplasty BP at target continue current antihypertensive agents LDL at target continue her statin   Next appointment: 3 months   Medication Adjustments/Labs and Tests Ordered: Current medicines are reviewed at length with the patient today.  Concerns regarding medicines are outlined above.  No orders of the defined types were placed in this encounter.  No orders of the defined types were placed in this encounter.   Chief Complaint  Patient presents with   Follow-up   Cardiomyopathy   Congestive Heart Failure     History of Present Illness:    Nichole Johnson is a 75 y.o. female with a hx of dilated cardiomyopathy related to chemotherapy and hypertensive heart disease with heart failure last seen 05/16/2020.  At that time she was doing well taking minimum dose of loop diuretic small dose of ACE inhibitor.  Cardiac MRI April 2021 shows EF of 20% LV 34% RV she was advised optimize medical treatment including Entresto and an ICD and has declined those endeavors.  Compliance with diet, lifestyle and medications: Yes  From cardiology perspective her heart failure is compensated she has no edema she  is not short of breath but tends to fatigue with activities no orthopnea chest pain palpitation or syncope. Recent left ventricular ejection fraction stable to improved 30 to 35% Again she is decided not to take SGLT2 inhibitor or Entresto.  Her biggest problem is knee pain and she will be seeing an orthopedic surgeon at [facility]. Despite the severity of her heart disease she is relatively asymptomatic stable on medical therapy and in my opinion is a acceptable surgical candidate for total knee arthroplasty. 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 Pt was given copy of recent ECHO report.  Last Assessment & Plan:  Pt will f/u w/ Cardiology re this. Pt was given copy of recent ECHO report.  Obstructive sleep apnea (adult) (pediatric) 07/08/2016   Personal history of radiation therapy 2001   Left Breast Cancer   Reactive airway disease without complication 09/12/3760    Past Surgical History:  Procedure Laterality Date   BREAST LUMPECTOMY Left 2001   CESAREAN SECTION     ROTATOR CUFF REPAIR Bilateral    SHOULDER CLOSED REDUCTION      Current Medications: Current Meds  Medication Sig   albuterol (PROVENTIL  HFA;VENTOLIN HFA) 108 (90 Base) MCG/ACT inhaler Inhale 2 puffs into the lungs every 6 (six) hours as needed for wheezing or shortness of breath.   alendronate (FOSAMAX) 5 MG tablet Take 5 mg by mouth once a week. Take with a full glass of water on an empty stomach.   Aspirin (ASPIR-81 PO) Take by mouth every morning.   brinzolamide (AZOPT) 1 % ophthalmic suspension Place 1 drop into both eyes 2 (two) times daily.   Calcium Carbonate-Vitamin D (CALCIUM + D PO) Take 1 tablet by mouth 2 (two) times daily.    Cholecalciferol 25 MCG (1000 UT) tablet Take 1,000 Units by mouth daily.    ferrous sulfate 325 (65 FE) MG tablet Take 325 mg by mouth daily.   fluticasone (FLONASE) 50 MCG/ACT nasal spray Place 2 sprays into both nostrils daily.   levothyroxine (SYNTHROID, LEVOTHROID) 100 MCG tablet Take 100 mcg by mouth daily.   lisinopril (ZESTRIL) 2.5 MG tablet TAKE 1 TABLET EVERY DAY   Loratadine 10 MG CAPS Take 10 mg by mouth daily.   metoprolol succinate (TOPROL-XL) 50 MG 24 hr tablet TAKE 1 TABLET (50 MG TOTAL) BY MOUTH DAILY. TAKE WITH OR IMMEDIATELY FOLLOWING A MEAL.   Multiple Vitamins-Minerals (CENTRUM SILVER PO) Take by mouth every morning.   psyllium (METAMUCIL) 58.6 % packet Take 1 packet by mouth daily as needed.    simvastatin (ZOCOR) 20 MG tablet simvastatin 20 mg tablet  TAKE 1 TABLET BY MOUTH ONCE DAILY AT NIGHT   torsemide (DEMADEX) 20 MG tablet Take 1 tablet (20 mg total) by mouth daily.   Travoprost, BAK Free, (TRAVATAN) 0.004 % SOLN ophthalmic solution Place 1 drop into both eyes at bedtime.   vitamin C (ASCORBIC ACID) 500 MG tablet Take 500 mg by mouth daily.     Allergies:   Sulfamethoxazole-trimethoprim, Tramadol, and Penicillins   Social History   Socioeconomic History   Marital status: Married    Spouse name: Not on file   Number of children: Not on file   Years of education: Not on file   Highest education level: Not on file  Occupational History   Not on file  Tobacco  Use   Smoking status: Never    Passive exposure: Never   Smokeless tobacco: Never   Tobacco comments:    NEVER USED TOBACCO  Vaping Use   Vaping Use: Never used  Substance and Sexual Activity   Alcohol use: Yes    Alcohol/week: 0.0 standard drinks    Comment: occ   Drug use: No   Sexual activity: Not on file  Other Topics Concern   Not on file  Social History Narrative   Not on file   Social Determinants of Health   Financial Resource Strain: Not on file  Food Insecurity: Not on file  Transportation Needs: Not on file  Physical Activity: Not on file  Stress: Not on file  Social Connections: Not on file     Family History: The patient's family history includes Hypertension in her mother. There is no  history of Heart attack, Stroke, or Diabetes. ROS:   Please see the history of present illness.    All other systems reviewed and are negative.  EKGs/Labs/Other Studies Reviewed:    The following studies were reviewed today:    Recent Labs: 05/16/2020: TSH 8.050 11/12/2020: ALT 11; BUN 17; Creatinine, Ser 0.97; NT-Pro BNP 1,293; Potassium 4.7; Sodium 143  Recent Lipid Panel    Component Value Date/Time   CHOL 159 11/12/2020 1215   TRIG 177 (H) 11/12/2020 1215   HDL 58 11/12/2020 1215   CHOLHDL 2.7 11/12/2020 1215   LDLCALC 71 11/12/2020 1215    Physical Exam:    VS:  BP 110/68   Pulse 86   Ht 4\' 11"  (1.499 m)   Wt 131 lb (59.4 kg)   SpO2 98%   BMI 26.46 kg/m     Wt Readings from Last 3 Encounters:  02/06/21 131 lb (59.4 kg)  11/12/20 133 lb (60.3 kg)  05/16/20 135 lb (61.2 kg)     GEN: Appears her age well nourished, well developed in no acute distress HEENT: Normal NECK: No JVD; No carotid bruits LYMPHATICS: No lymphadenopathy CARDIAC: RRR, no murmurs, rubs, gallops RESPIRATORY:  Clear to auscultation without rales, wheezing or rhonchi  ABDOMEN: Soft, non-tender, non-distended MUSCULOSKELETAL:  No edema; No deformity  SKIN: Warm and  dry NEUROLOGIC:  Alert and oriented x 3 PSYCHIATRIC:  Normal affect    Signed, Shirlee More, MD  02/06/2021 12:01 PM    [facility]

## 2021-02-06 ENCOUNTER — Encounter: Payer: Self-pay | Admitting: Cardiology

## 2021-02-06 ENCOUNTER — Ambulatory Visit: Payer: Medicare HMO | Admitting: Cardiology

## 2021-02-06 ENCOUNTER — Other Ambulatory Visit: Payer: Self-pay

## 2021-02-06 VITALS — BP 110/68 | HR 86 | Ht 59.0 in | Wt 131.0 lb

## 2021-02-06 DIAGNOSIS — I5022 Chronic systolic (congestive) heart failure: Secondary | ICD-10-CM | POA: Diagnosis not present

## 2021-02-06 DIAGNOSIS — E785 Hyperlipidemia, unspecified: Secondary | ICD-10-CM

## 2021-02-06 DIAGNOSIS — I420 Dilated cardiomyopathy: Secondary | ICD-10-CM | POA: Diagnosis not present

## 2021-02-06 DIAGNOSIS — I110 Hypertensive heart disease with heart failure: Secondary | ICD-10-CM | POA: Diagnosis not present

## 2021-02-06 NOTE — Patient Instructions (Signed)
Medication Instructions:  Your physician recommends that you continue on your current medications as directed. Please refer to the Current Medication list given to you today.  *If you need a refill on your cardiac medications before your next appointment, please call your pharmacy*   Lab Work: None If you have labs (blood work) drawn today and your tests are completely normal, you will receive your results only by: [place] (if you have MyChart) OR A paper copy in the mail If you have any lab test that is abnormal or we need to change your treatment, we will call you to review the results.   Testing/Procedures: None   Follow-Up: At [facility], you and your health needs are our priority.  As part of our continuing mission to provide you with exceptional heart care, we have created designated Provider Care Teams.  These Care Teams include your primary Cardiologist (physician) and Advanced Practice Providers (APPs -  Physician Assistants and Nurse Practitioners) who all work together to provide you with the care you need, when you need it.  We recommend signing up for the patient portal called "MyChart".  Sign up information is provided on this After Visit Summary.  MyChart is used to connect with patients for Virtual Visits (Telemedicine).  Patients are able to view lab/test results, encounter notes, upcoming appointments, etc.  Non-urgent messages can be sent to your provider as well.   To learn more about what you can do with MyChart, go to [email or web address].    Your next appointment:   4 month(s)  The format for your next appointment:   In Person  Provider:   Shirlee More, MD   Other Instructions

## 2021-03-03 ENCOUNTER — Telehealth: Payer: Self-pay | Admitting: Cardiology

## 2021-03-03 ENCOUNTER — Encounter: Payer: Self-pay | Admitting: Cardiology

## 2021-03-03 NOTE — Telephone Encounter (Signed)
Pt c/o medication issue:  1. Name of Medication:  Gabapentin  2. How are you currently taking this medication (dosage and times per day)?  Patient hasn't started   3. Are you having a reaction (difficulty breathing--STAT)?   4. What is your medication issue?   Patient states she has been experiencing numbness in her hands, mainly in right hand. She states she saw her PCP today and they took an Xray of her neck and prescribed Gabapentin. She states she researched the medication and read up on side effects. She has concerns due to CHF and states she would like to know what Dr. Bettina Gavia recommends before picking up this medication. Any advisement regarding numbness? Please advise.

## 2021-03-03 NOTE — Telephone Encounter (Signed)
Spoke to the patient just now and let her know Dr. Joya Gaskins recommendations. She verbalizes understanding and thanks me for the call back.

## 2021-03-07 ENCOUNTER — Other Ambulatory Visit: Payer: Self-pay | Admitting: Cardiology

## 2021-04-01 DIAGNOSIS — N1831 Chronic kidney disease, stage 3a: Secondary | ICD-10-CM | POA: Insufficient documentation

## 2021-04-01 DIAGNOSIS — M4312 Spondylolisthesis, cervical region: Secondary | ICD-10-CM | POA: Insufficient documentation

## 2021-04-01 HISTORY — DX: Spondylolisthesis, cervical region: M43.12

## 2021-04-01 HISTORY — DX: Chronic kidney disease, stage 3a: N18.31

## 2021-06-24 ENCOUNTER — Ambulatory Visit: Payer: Medicare HMO | Admitting: Cardiology

## 2021-08-19 ENCOUNTER — Telehealth: Payer: Self-pay

## 2021-08-19 NOTE — Telephone Encounter (Signed)
? ?  Pre-operative Risk Assessment  ?  ?Patient Name: Nichole Johnson  ?DOB: [birth year] ?MRN: [id number]  ? ?  ? ?Request for Surgical Clearance   ? ?Procedure:   Cataract extraction w/ Intraocular Lens Implantation of the Right Eye, followed by the Left Eye ? ?Date of Surgery:  Clearance 11/14/21                              ?   ?Surgeon:  Dr. Darleen Crocker ?Surgeon's Group or Practice Name:  [facility] ?Phone number:  [phone] ?Fax number:  [phone] ?  ?Type of Clearance Requested:   ?- Medical  ?  ?Type of Anesthesia:   Topical anesthesia w/ IV medication  ?  ?Additional requests/questions:     ? ?Signed, ?Toni Arthurs   ?08/19/2021, 1:40 PM   ?

## 2021-08-19 NOTE — Telephone Encounter (Signed)
? ?  Patient Name: Nichole Johnson  ?DOB: [birth year] ?MRN: [id number] ? ?Primary Cardiologist: Shirlee More, MD ? ?Chart reviewed as part of pre-operative protocol coverage. Cataract extractions are recognized in guidelines as low risk surgeries that do not typically require specific preoperative testing or holding of blood thinner therapy. Therefore, given past medical history and time since last visit, based on ACC/AHA guidelines, [name] would be at acceptable risk for the planned procedure without further cardiovascular testing.  ? ?I will route this recommendation to the requesting party via Epic fax function and remove from pre-op pool. ? ?Please call with questions. ? ?Almyra Deforest, [place] ?08/19/2021, 4:06 PM ? ?

## 2021-09-05 ENCOUNTER — Other Ambulatory Visit: Payer: Self-pay | Admitting: Cardiology

## 2021-09-27 NOTE — Progress Notes (Unsigned)
Cardiology Office Note:    Date:  09/30/2021   ID:  Nichole Johnson, DOB 06-03-1945, MRN [id number]  PCP:  Rudene Anda, MD  Cardiologist:  Shirlee More, MD    Referring MD: Rudene Anda, MD    ASSESSMENT:    1. Dilated cardiomyopathy ([place])   2. Hypertensive heart disease with heart failure ([place])    PLAN:    In order of problems listed above:  The severity of her cardiomyopathy she is doing well with medical therapy we will go back to 20 mg of torsemide daily recheck BMP She has declined ICD for advanced heart failure treatments.   Next appointment: 4 months   Medication Adjustments/Labs and Tests Ordered: Current medicines are reviewed at length with the patient today.  Concerns regarding medicines are outlined above.  No orders of the defined types were placed in this encounter.  No orders of the defined types were placed in this encounter.   Chief Complaint  Patient presents with   Follow-up   Congestive Heart Failure    History of Present Illness:    Nichole Johnson is a 76 y.o. 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  She underwent right total knee arthroplasty 05/30/2021.  Compliance with diet, lifestyle and medications: Yes  She is struggled after her total knee arthroplasty and found rehab to be difficult She was symptomatic 1 day with hypotension seen in urgent care and her diuretic was decreased Her weight is up a few pounds she is a little bit more edema and we will have her go back to 20 mg of torsemide daily split She is not having shortness of breath orthopnea chest pain palpitation or syncope Past Medical History:  Diagnosis Date   Acute on chronic systolic congestive heart  failure ([place]) 05/19/2016   Acute renal insufficiency 06/09/2016   AKI (acute kidney injury) ([place]) 11/03/2017   Anemia 04/18/2014   Benign hypertension 07/08/2016   Breast cancer ([place]) 2001   Left Breast Cancer   Cancer ([place])    Cardiomyopathy due to chemotherapy ([place]) 04/10/2018   Dilated cardiomyopathy ([place]) 04/10/2018   Dyspepsia 01/18/2017   Dysplastic nevus 12/02/2016   Glaucoma 10/14/2017   High cholesterol    Hyperlipidemia 04/18/2014   Hypertension    Hypertensive heart disease with heart failure ([place]) 04/10/2018   Hypotension 06/09/2016   Hypothyroidism 04/18/2014   Irritable bowel syndrome without diarrhea 9/47/0962   Mild diastolic dysfunction 11/14/6627   Overview:  Last Assessment & Plan:  Pt will f/u w/ Cardiology re this. Pt was given copy of recent ECHO report.  Last Assessment & Plan:  Pt will f/u w/ Cardiology re this. Pt was given copy of recent ECHO report.    Obstructive sleep apnea (adult) (pediatric) 07/08/2016   Personal history of radiation therapy 2001   Left Breast Cancer   Reactive airway disease without complication 07/18/6544    Past Surgical History:  Procedure Laterality Date   BREAST LUMPECTOMY Left 2001   CESAREAN SECTION     ROTATOR CUFF REPAIR Bilateral    SHOULDER CLOSED REDUCTION      Current Medications: Current Meds  Medication Sig   albuterol (PROVENTIL HFA;VENTOLIN HFA) 108 (90 Base) MCG/ACT inhaler Inhale 2 puffs into the lungs every  6 (six) hours as needed for wheezing or shortness of breath.   alendronate (FOSAMAX) 5 MG tablet Take 5 mg by mouth once a week. Take with a full glass of water on an empty stomach.   Aspirin (ASPIR-81 PO) Take by mouth every morning.   brinzolamide (AZOPT) 1 % ophthalmic suspension Place 1 drop into both eyes 2 (two) times daily.   Calcium Carbonate-Vitamin D (CALCIUM + D PO) Take 1 tablet by mouth 2 (two) times daily.    Cholecalciferol 25 MCG (1000 UT) tablet Take 1,000 Units by mouth daily.    ferrous sulfate 325 (65  FE) MG tablet Take 325 mg by mouth daily.   fluticasone (FLONASE) 50 MCG/ACT nasal spray Place 2 sprays into both nostrils daily.   levothyroxine (SYNTHROID, LEVOTHROID) 100 MCG tablet Take 100 mcg by mouth daily.   lisinopril (ZESTRIL) 2.5 MG tablet TAKE 1 TABLET EVERY DAY   Loratadine 10 MG CAPS Take 10 mg by mouth daily.   metoprolol succinate (TOPROL-XL) 50 MG 24 hr tablet TAKE 1 TABLET DAILY. TAKE WITH OR IMMEDIATELY FOLLOWING A MEAL.   Multiple Vitamins-Minerals (CENTRUM SILVER PO) Take by mouth every morning.   psyllium (METAMUCIL) 58.6 % packet Take 1 packet by mouth daily as needed.    simvastatin (ZOCOR) 20 MG tablet simvastatin 20 mg tablet  TAKE 1 TABLET BY MOUTH ONCE DAILY AT NIGHT   torsemide (DEMADEX) 20 MG tablet Take 0.5 tablets by mouth daily at 12 noon.   Travoprost, BAK Free, (TRAVATAN) 0.004 % SOLN ophthalmic solution Place 1 drop into both eyes at bedtime.   vitamin B-12 (CYANOCOBALAMIN) 1000 MCG tablet Take 1,000 mcg by mouth daily.   vitamin C (ASCORBIC ACID) 500 MG tablet Take 500 mg by mouth daily.     Allergies:   Sulfamethoxazole-trimethoprim, Tramadol, and Penicillins   Social History   Socioeconomic History   Marital status: Married    Spouse name: Not on file   Number of children: Not on file   Years of education: Not on file   Highest education level: Not on file  Occupational History   Not on file  Tobacco Use   Smoking status: Never    Passive exposure: Never   Smokeless tobacco: Never   Tobacco comments:    NEVER USED TOBACCO  Vaping Use   Vaping Use: Never used  Substance and Sexual Activity   Alcohol use: Yes    Alcohol/week: 0.0 standard drinks of alcohol    Comment: occ   Drug use: No   Sexual activity: Not on file  Other Topics Concern   Not on file  Social History Narrative   Not on file   Social Determinants of Health   Financial Resource Strain: Not on file  Food Insecurity: Not on file  Transportation Needs: Not on file   Physical Activity: Not on file  Stress: Not on file  Social Connections: Not on file     Family History: The patient's family history includes Hypertension in her mother. There is no history of Heart attack, Stroke, or Diabetes. ROS:   Please see the history of present illness.    All other systems reviewed and are negative.  EKGs/Labs/Other Studies Reviewed:    The following studies were reviewed today:  EKG:  EKG ordered today and personally reviewed.  The ekg ordered today demonstrates left axis deviation left anterior hemiblock  Recent Labs: 11/12/2020: ALT 11; BUN 17; Creatinine, Ser 0.97; NT-Pro BNP 1,293; Potassium 4.7; Sodium 143  Recent Lipid Panel    Component Value Date/Time   CHOL 159 11/12/2020 1215   TRIG 177 (H) 11/12/2020 1215   HDL 58 11/12/2020 1215   CHOLHDL 2.7 11/12/2020 1215   LDLCALC 71 11/12/2020 1215    Physical Exam:    VS:  BP 104/68 (BP Location: Right Arm, Patient Position: Sitting)   Pulse 80   Ht '4\' 11"'$  (1.499 m)   Wt 128 lb 9.6 oz (58.3 kg)   SpO2 96%   BMI 25.97 kg/m     Wt Readings from Last 3 Encounters:  09/30/21 128 lb 9.6 oz (58.3 kg)  02/06/21 131 lb (59.4 kg)  11/12/20 133 lb (60.3 kg)     GEN:  Well nourished, well developed in no acute distress HEENT: Normal NECK: No JVD; No carotid bruits LYMPHATICS: No lymphadenopathy CARDIAC: RRR, no murmurs, rubs, gallops RESPIRATORY:  Clear to auscultation without rales, wheezing or rhonchi  ABDOMEN: Soft, non-tender, non-distended MUSCULOSKELETAL:  No edema; No deformity  SKIN: Warm and dry NEUROLOGIC:  Alert and oriented x 3 PSYCHIATRIC:  Normal affect    Signed, Shirlee More, MD  09/30/2021 2:19 PM    [facility]

## 2021-09-30 ENCOUNTER — Ambulatory Visit: Payer: Medicare HMO | Admitting: Cardiology

## 2021-09-30 ENCOUNTER — Encounter: Payer: Self-pay | Admitting: Cardiology

## 2021-09-30 ENCOUNTER — Other Ambulatory Visit: Payer: Self-pay

## 2021-09-30 VITALS — BP 104/68 | HR 80 | Ht 59.0 in | Wt 128.6 lb

## 2021-09-30 DIAGNOSIS — I110 Hypertensive heart disease with heart failure: Secondary | ICD-10-CM

## 2021-09-30 DIAGNOSIS — I420 Dilated cardiomyopathy: Secondary | ICD-10-CM | POA: Diagnosis not present

## 2021-09-30 DIAGNOSIS — I5023 Acute on chronic systolic (congestive) heart failure: Secondary | ICD-10-CM

## 2021-09-30 MED ORDER — TORSEMIDE 20 MG PO TABS: 10.0000 mg | ORAL_TABLET | Freq: Two times a day (BID) | ORAL | 3 refills | Status: DC

## 2021-09-30 NOTE — Patient Instructions (Signed)
Medication Instructions:  Your physician has recommended you make the following change in your medication:   START: Torsemide 10 mg twice daily  *If you need a refill on your cardiac medications before your next appointment, please call your pharmacy*   Lab Work: Your physician recommends that you return for lab work in:   Labs today: BMP, Pro BNP  If you have labs (blood work) drawn today and your tests are completely normal, you will receive your results only by: MyChart Message (if you have MyChart) OR A paper copy in the mail If you have any lab test that is abnormal or we need to change your treatment, we will call you to review the results.   Testing/Procedures: None   Follow-Up: At [facility], you and your health needs are our priority.  As part of our continuing mission to provide you with exceptional heart care, we have created designated Provider Care Teams.  These Care Teams include your primary Cardiologist (physician) and Advanced Practice Providers (APPs -  Physician Assistants and Nurse Practitioners) who all work together to provide you with the care you need, when you need it.  We recommend signing up for the patient portal called "MyChart".  Sign up information is provided on this After Visit Summary.  MyChart is used to connect with patients for Virtual Visits (Telemedicine).  Patients are able to view lab/test results, encounter notes, upcoming appointments, etc.  Non-urgent messages can be sent to your provider as well.   To learn more about what you can do with MyChart, go to [email or web address].    Your next appointment:   4 month(s)  The format for your next appointment:   In Person  Provider:   Shirlee More, MD    Other Instructions None  Important Information About Sugar

## 2021-10-01 LAB — BASIC METABOLIC PANEL
BUN/Creatinine Ratio: 18 (ref 12–28)
BUN: 20 mg/dL (ref 8–27)
CO2: 24 mmol/L (ref 20–29)
Calcium: 9.6 mg/dL (ref 8.7–10.3)
Chloride: 101 mmol/L (ref 96–106)
Creatinine, Ser: 1.09 mg/dL — ABNORMAL HIGH (ref 0.57–1.00)
Glucose: 90 mg/dL (ref 70–99)
Potassium: 4.8 mmol/L (ref 3.5–5.2)
Sodium: 140 mmol/L (ref 134–144)
eGFR: 52 mL/min/{1.73_m2} — ABNORMAL LOW (ref >59)

## 2021-10-01 LAB — PRO B NATRIURETIC PEPTIDE: NT-Pro BNP: 2432 pg/mL — ABNORMAL HIGH (ref 0–738)

## 2021-10-02 ENCOUNTER — Telehealth: Payer: Self-pay | Admitting: Cardiology

## 2021-10-02 ENCOUNTER — Other Ambulatory Visit: Payer: Self-pay

## 2021-10-02 MED ORDER — TORSEMIDE 20 MG PO TABS: 10.0000 mg | ORAL_TABLET | Freq: Two times a day (BID) | ORAL | 3 refills | Status: DC

## 2021-10-02 NOTE — Telephone Encounter (Signed)
*  STAT* If patient is at the pharmacy, call can be transferred to refill team.   1. Which medications need to be refilled? (please list name of each medication and dose if known) torsemide (DEMADEX) 20 MG tablet  2. Which pharmacy/location (including street and city if local pharmacy) is medication to be sent to? [place], [place]  3. Do they need a 30 day or 90 day supply? 90 day  Patient would like for it to be sent to her mail order not her local pharmacy. Thanks.

## 2021-11-06 ENCOUNTER — Ambulatory Visit: Payer: Medicare HMO | Admitting: Cardiology

## 2021-11-06 ENCOUNTER — Telehealth: Payer: Self-pay | Admitting: Cardiology

## 2021-11-06 ENCOUNTER — Other Ambulatory Visit: Payer: Self-pay

## 2021-11-06 DIAGNOSIS — R058 Other specified cough: Secondary | ICD-10-CM

## 2021-11-06 DIAGNOSIS — C50912 Malignant neoplasm of unspecified site of left female breast: Secondary | ICD-10-CM

## 2021-11-06 DIAGNOSIS — M810 Age-related osteoporosis without current pathological fracture: Secondary | ICD-10-CM

## 2021-11-06 MED ORDER — ASPIRIN 81 MG PO TBEC: 81.0000 mg | DELAYED_RELEASE_TABLET | Freq: Every morning | ORAL | 3 refills | Status: DC

## 2021-11-06 MED ORDER — TORSEMIDE 20 MG PO TABS: 10.0000 mg | ORAL_TABLET | Freq: Two times a day (BID) | ORAL | 3 refills | Status: DC

## 2021-11-06 NOTE — Telephone Encounter (Signed)
*  STAT* If patient is at the pharmacy, call can be transferred to refill team.   1. Which medications need to be refilled? (please list name of each medication and dose if known) Aspirin (ASPIR-81 PO)  torsemide (DEMADEX) 20 MG tablet   2. Which pharmacy/location (including street and city if local pharmacy) is medication to be sent to?  [place], [place]  3. Do they need a 30 day or 90 day supply?  90 day

## 2022-01-30 ENCOUNTER — Ambulatory Visit: Payer: Medicare HMO | Admitting: Cardiology

## 2022-02-02 ENCOUNTER — Ambulatory Visit: Payer: Medicare HMO | Admitting: Cardiology

## 2022-02-25 ENCOUNTER — Encounter: Payer: Self-pay | Admitting: Cardiology

## 2022-02-25 ENCOUNTER — Ambulatory Visit: Payer: Medicare HMO | Attending: Cardiology | Admitting: Cardiology

## 2022-02-25 ENCOUNTER — Other Ambulatory Visit: Payer: Self-pay

## 2022-02-25 VITALS — BP 100/66 | HR 96 | Ht 59.0 in | Wt 131.8 lb

## 2022-02-25 DIAGNOSIS — I110 Hypertensive heart disease with heart failure: Secondary | ICD-10-CM | POA: Diagnosis not present

## 2022-02-25 DIAGNOSIS — I420 Dilated cardiomyopathy: Secondary | ICD-10-CM | POA: Diagnosis not present

## 2022-02-25 DIAGNOSIS — E785 Hyperlipidemia, unspecified: Secondary | ICD-10-CM

## 2022-02-25 DIAGNOSIS — I5022 Chronic systolic (congestive) heart failure: Secondary | ICD-10-CM | POA: Diagnosis not present

## 2022-02-25 NOTE — Patient Instructions (Signed)
Medication Instructions:  Your physician recommends that you continue on your current medications as directed. Please refer to the Current Medication list given to you today.  *If you need a refill on your cardiac medications before your next appointment, please call your pharmacy*   Lab Work: Your physician recommends that you return for lab work in: Today for [place], [place]   If you have labs (blood work) drawn today and your tests are completely normal, you will receive your results only by: MyChart Message (if you have MyChart) OR A paper copy in the mail If you have any lab test that is abnormal or we need to change your treatment, we will call you to review the results.   Testing/Procedures: NONE   Follow-Up: At [facility], you and your health needs are our priority.  As part of our continuing mission to provide you with exceptional heart care, we have created designated Provider Care Teams.  These Care Teams include your primary Cardiologist (physician) and Advanced Practice Providers (APPs -  Physician Assistants and Nurse Practitioners) who all work together to provide you with the care you need, when you need it.  We recommend signing up for the patient portal called "MyChart".  Sign up information is provided on this After Visit Summary.  MyChart is used to connect with patients for Virtual Visits (Telemedicine).  Patients are able to view lab/test results, encounter notes, upcoming appointments, etc.  Non-urgent messages can be sent to your provider as well.   To learn more about what you can do with MyChart, go to [email or web address].    Your next appointment:   6 month(s)  The format for your next appointment:   In Person  Provider:   Shirlee More, MD    Other Instructions   Important Information About Sugar

## 2022-02-25 NOTE — Progress Notes (Signed)
Cardiology Office Note:    Date:  02/25/2022   ID:  Nichole Johnson, DOB [birth year], MRN [id number]  PCP:  Nichole Anda, MD  Cardiologist:  Nichole More, MD    Referring MD: Nichole Anda, MD    ASSESSMENT:    1. Dilated cardiomyopathy ([place])   2. Hypertensive heart disease with heart failure ([place])   3. Chronic systolic congestive heart failure ([place])   4. Hyperlipidemia, unspecified hyperlipidemia type    PLAN:    In order of problems listed above:  Despite the severity of her cardiomyopathy she does well functionally and symptomatically.  She takes a minimum dose of torsemide 10 mg twice daily as no fluid overload is not having symptoms of shortness of breath.  She is on maximally tolerated medication beta-blocker and ACE inhibitor.  We will recheck renal function today along with proBNP level Continue her statin check lipid profile liver function   Next appointment: 6 months   Medication Adjustments/Labs and Tests Ordered: Current medicines are reviewed at length with the patient today.  Concerns regarding medicines are outlined above.  Orders Placed This Encounter  Procedures   Comprehensive metabolic panel   Lipid panel   Pro b natriuretic peptide (BNP)   No orders of the defined types were placed in this encounter.   Chief Complaint  Patient presents with   Follow-up   Cardiomyopathy    History of Present Illness:    Nichole Johnson is a 75 y.o. 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  She underwent right total knee arthroplasty 05/30/2021 last seen 09/30/2021.  Compliance with diet, lifestyle and medications: Yes  She continues to do well and has really flourished since she had total  knee arthroplasty Despite the severity of her cardiomyopathy she is not having edema shortness of breath chest pain palpitation or syncope She is considering traveling to [place] and I told her that if she is in a large city like [place] and needs cardiology care to search for an [facility] I again raised the opportunity of optimizing care she is pleased with the quality of life does not want to take other or additional medications for cardiomyopathy and has declined advanced heart failure and ICD therapy I respect her wishes Past Medical History:  Diagnosis Date   Acute on chronic systolic congestive heart failure ([place]) 05/19/2016   Acute renal insufficiency 06/09/2016   AKI (acute kidney injury) ([place]) 11/03/2017   Anemia 04/18/2014   Benign hypertension 07/08/2016   Breast cancer ([place]) 2001   Left Breast Cancer   Cancer ([place])    Cardiomyopathy due to chemotherapy ([place]) 04/10/2018   Dilated cardiomyopathy ([place]) 04/10/2018   Dyspepsia 01/18/2017   Dysplastic nevus 12/02/2016   Glaucoma 10/14/2017   High cholesterol    Hyperlipidemia 04/18/2014   Hypertension    Hypertensive heart disease with heart failure ([place]) 04/10/2018   Hypotension 06/09/2016   Hypothyroidism 04/18/2014   Irritable bowel syndrome without diarrhea 8/93/8101   Mild diastolic dysfunction 10/16/1023   Overview:  Last Assessment & Plan:  Pt will f/u w/ Cardiology re this. Pt was given copy of recent ECHO report.  Last Assessment & Plan:  Pt will f/u w/ Cardiology re this. Pt was given copy of recent ECHO report.  Obstructive sleep apnea (adult) (pediatric) 07/08/2016   Personal history of radiation therapy 2001   Left Breast Cancer   Reactive airway disease without complication 06/18/9022    Past Surgical History:  Procedure Laterality Date   BREAST LUMPECTOMY Left 2001   CESAREAN SECTION     ROTATOR CUFF REPAIR Bilateral    SHOULDER CLOSED REDUCTION      Current Medications: Current Meds  Medication Sig   albuterol (PROVENTIL  HFA;VENTOLIN HFA) 108 (90 Base) MCG/ACT inhaler Inhale 2 puffs into the lungs every 6 (six) hours as needed for wheezing or shortness of breath.   alendronate (FOSAMAX) 5 MG tablet Take 5 mg by mouth once a week. Take with a full glass of water on an empty stomach.   aspirin EC (ASPIRIN 81) 81 MG tablet Take 1 tablet (81 mg total) by mouth every morning.   brinzolamide (AZOPT) 1 % ophthalmic suspension Place 1 drop into both eyes 2 (two) times daily.   Calcium Carbonate-Vitamin D (CALCIUM + D PO) Take 1 tablet by mouth 2 (two) times daily.    Cholecalciferol 25 MCG (1000 UT) tablet Take 1,000 Units by mouth daily.    ferrous sulfate 325 (65 FE) MG tablet Take 325 mg by mouth daily.   fluticasone (FLONASE) 50 MCG/ACT nasal spray Place 2 sprays into both nostrils daily.   levothyroxine (SYNTHROID, LEVOTHROID) 100 MCG tablet Take 100 mcg by mouth daily.   lisinopril (ZESTRIL) 2.5 MG tablet TAKE 1 TABLET EVERY DAY   Loratadine 10 MG CAPS Take 10 mg by mouth daily.   metoprolol succinate (TOPROL-XL) 50 MG 24 hr tablet TAKE 1 TABLET DAILY. TAKE WITH OR IMMEDIATELY FOLLOWING A MEAL.   Multiple Vitamins-Minerals (CENTRUM SILVER PO) Take by mouth every morning.   psyllium (METAMUCIL) 58.6 % packet Take 1 packet by mouth daily as needed.    simvastatin (ZOCOR) 20 MG tablet simvastatin 20 mg tablet  TAKE 1 TABLET BY MOUTH ONCE DAILY AT NIGHT   torsemide (DEMADEX) 20 MG tablet Take 0.5 tablets (10 mg total) by mouth 2 (two) times daily.   Travoprost, BAK Free, (TRAVATAN) 0.004 % SOLN ophthalmic solution Place 1 drop into both eyes at bedtime.   vitamin B-12 (CYANOCOBALAMIN) 1000 MCG tablet Take 1,000 mcg by mouth daily.   vitamin C (ASCORBIC ACID) 500 MG tablet Take 500 mg by mouth daily.     Allergies:   Sulfamethoxazole-trimethoprim, Tramadol, and Penicillins   Social History   Socioeconomic History   Marital status: Married    Spouse name: Not on file   Number of children: Not on file   Years of  education: Not on file   Highest education level: Not on file  Occupational History   Not on file  Tobacco Use   Smoking status: Never    Passive exposure: Never   Smokeless tobacco: Never   Tobacco comments:    NEVER USED TOBACCO  Vaping Use   Vaping Use: Never used  Substance and Sexual Activity   Alcohol use: Yes    Alcohol/week: 0.0 standard drinks of alcohol    Comment: occ   Drug use: No   Sexual activity: Not on file  Other Topics Concern   Not on file  Social History Narrative   Not on file   Social Determinants of Health   Financial Resource Strain: Not on file  Food Insecurity: Not on file  Transportation Needs: Not on file  Physical Activity: Not on file  Stress: Not on file  Social Connections: Not on file     Family History: The patient's family history includes Hypertension in her mother. There is no history of Heart attack, Stroke, or Diabetes. ROS:   Please see the history of present illness.    All other systems reviewed and are negative.  EKGs/Labs/Other Studies Reviewed:    The following studies were reviewed today:  Recent Labs: 09/30/2021: BUN 20; Creatinine, Ser 1.09; NT-Pro BNP 2,432; Potassium 4.8; Sodium 140  Recent Lipid Panel    Component Value Date/Time   CHOL 159 11/12/2020 1215   TRIG 177 (H) 11/12/2020 1215   HDL 58 11/12/2020 1215   CHOLHDL 2.7 11/12/2020 1215   LDLCALC 71 11/12/2020 1215    Physical Exam:    VS:  BP 100/66 (BP Location: Left Arm, Patient Position: Sitting, Cuff Size: Normal)   Pulse 96   Ht '4\' 11"'$  (1.499 m)   Wt 131 lb 12.8 oz (59.8 kg)   SpO2 98%   BMI 26.62 kg/m     Wt Readings from Last 3 Encounters:  02/25/22 131 lb 12.8 oz (59.8 kg)  09/30/21 128 lb 9.6 oz (58.3 kg)  02/06/21 131 lb (59.4 kg)     GEN:  Well nourished, well developed in no acute distress HEENT: Normal NECK: No JVD; No carotid bruits LYMPHATICS: No lymphadenopathy CARDIAC: RRR, no murmurs, rubs, gallops RESPIRATORY:  Clear  to auscultation without rales, wheezing or rhonchi  ABDOMEN: Soft, non-tender, non-distended MUSCULOSKELETAL:  No edema; No deformity  SKIN: Warm and dry NEUROLOGIC:  Alert and oriented x 3 PSYCHIATRIC:  Normal affect    Signed, Nichole More, MD  02/25/2022 11:40 AM    [place]

## 2022-02-26 LAB — COMPREHENSIVE METABOLIC PANEL
ALT: 16 IU/L (ref 0–32)
AST: 18 IU/L (ref 0–40)
Albumin/Globulin Ratio: 1.8 (ref 1.2–2.2)
Albumin: 4.4 g/dL (ref 3.8–4.8)
Alkaline Phosphatase: 57 IU/L (ref 44–121)
BUN/Creatinine Ratio: 18 (ref 12–28)
BUN: 19 mg/dL (ref 8–27)
Bilirubin Total: 0.7 mg/dL (ref 0.0–1.2)
CO2: 27 mmol/L (ref 20–29)
Calcium: 9.6 mg/dL (ref 8.7–10.3)
Chloride: 99 mmol/L (ref 96–106)
Creatinine, Ser: 1.07 mg/dL — ABNORMAL HIGH (ref 0.57–1.00)
Globulin, Total: 2.4 g/dL (ref 1.5–4.5)
Glucose: 70 mg/dL (ref 70–99)
Potassium: 4.5 mmol/L (ref 3.5–5.2)
Sodium: 140 mmol/L (ref 134–144)
Total Protein: 6.8 g/dL (ref 6.0–8.5)
eGFR: 53 mL/min/{1.73_m2} — ABNORMAL LOW (ref >59)

## 2022-02-26 LAB — LIPID PANEL
Chol/HDL Ratio: 2.6 ratio (ref 0.0–4.4)
Cholesterol, Total: 168 mg/dL (ref 100–199)
HDL: 65 mg/dL (ref >39)
LDL Chol Calc (NIH): 84 mg/dL (ref 0–99)
Triglycerides: 105 mg/dL (ref 0–149)
VLDL Cholesterol Cal: 19 mg/dL (ref 5–40)

## 2022-02-26 LAB — PRO B NATRIURETIC PEPTIDE: NT-Pro BNP: 1722 pg/mL — ABNORMAL HIGH (ref 0–738)

## 2022-03-16 ENCOUNTER — Other Ambulatory Visit: Payer: Self-pay | Admitting: Cardiology

## 2022-05-06 ENCOUNTER — Telehealth: Payer: Self-pay | Admitting: *Deleted

## 2022-05-06 NOTE — Telephone Encounter (Signed)
Pt's husband called to let Dr. Bettina Gavia know that pt went to [place] and forgot her Torsemide. They do not have in drug store there so she purchased Furosemide 20 mg to take twice daily. Please let pt know pt's husband know if this is not okay. Told him we would call if there was a problem.

## 2022-05-07 NOTE — Telephone Encounter (Signed)
Spoke with Husband Randall Hiss regarding question about Furosemide and Torsemide. Pt verbalized understanding and had no further questions.

## 2022-06-29 ENCOUNTER — Other Ambulatory Visit: Payer: Self-pay

## 2022-06-29 ENCOUNTER — Telehealth: Payer: Self-pay | Admitting: Cardiology

## 2022-06-29 DIAGNOSIS — I5023 Acute on chronic systolic (congestive) heart failure: Secondary | ICD-10-CM

## 2022-06-29 NOTE — Telephone Encounter (Signed)
Called patient and she reported that last Friday she had some abdominal cramping in her upper stomach area. She took some Pepto bismol and had a loose stool and the cramping pain was relieved. During the weekend she thought she should have have the pain she was having examined and she went to the urgent care center. They did an EKG and she was told it was abnormal. She saw her PCP this past morning and a troponin and an EKG was ordered and completed. Spoke to Dr. Agustin Cree regarding this patient and he recommended that we get a copy of the EKG from the PCP and schedule the patient to have an echo. Patient verbalized understanding and had no further questions at this time.

## 2022-06-29 NOTE — Telephone Encounter (Signed)
Called patient and she reported that her PCP office called and they compared the EKG that was done in the office today to her previous EKG and there were no changes that were noticed. They faxed to our office the EKG that was done today and Dr. Agustin Cree compared it to her old EKG's and there were no changes noticed. Informed patient that someone from the front office would be calling her to schedule an echocardiogram. Patient was appreciative for the call and had no further questions at this time.

## 2022-06-29 NOTE — Telephone Encounter (Signed)
Pt calling back to speak with you

## 2022-06-29 NOTE — Telephone Encounter (Signed)
Pt c/o of Chest Pain: STAT if CP now or developed within 24 hours  1. Are you having CP right now? no  2. Are you experiencing any other symptoms (ex. SOB, nausea, vomiting, sweating)? Patient states over the weekend she had some sweating and indigestion.  She went to urgent care and her EKG was abnormal, she went to her PCP today and that EKG was abnormal as well.    3. How long have you been experiencing CP? She states she had no chest pain.  4. Is your CP continuous or coming and going? --  5. Have you taken Nitroglycerin? No  Patient states she took some pepto and the indigestion went away.   ?

## 2022-06-30 NOTE — Telephone Encounter (Signed)
Pt is requesting to speak to RN Richard. Please advise

## 2022-06-30 NOTE — Telephone Encounter (Signed)
Left message for the patient to call back.

## 2022-07-01 NOTE — Telephone Encounter (Signed)
Returned spouses call- No answer

## 2022-07-01 NOTE — Telephone Encounter (Signed)
Nichole Johnson states that his wife is in the ED. No other questions.

## 2022-07-01 NOTE — Telephone Encounter (Signed)
Pt's husband returning nurses call. Please advise

## 2022-07-02 ENCOUNTER — Telehealth: Payer: Self-pay | Admitting: Cardiology

## 2022-07-02 NOTE — Telephone Encounter (Signed)
Informed patient that Dr Bettina Gavia is out of office until Monday 07/06/22 and that her Echo results done at [facility] are available in care everywhere for Dr Bettina Gavia at her scheduled appointment with him 07/06/2022. Patient voices understanding and confirmed her scheduled appointment date and time.

## 2022-07-02 NOTE — Telephone Encounter (Signed)
  Pt called back to let Dr. Bettina Gavia know, she got her echo at the hospital today

## 2022-07-04 NOTE — Progress Notes (Unsigned)
Cardiology Office Note:    Date:  07/06/2022  ID:  Nichole Johnson, DOB November 10, 1945, MRN [id number]  PCP:  Rudene Anda, MD  Cardiologist:  Shirlee More, MD    Referring MD: Rudene Anda, MD    ASSESSMENT:    1. Dilated cardiomyopathy ([place])   2. Hypertensive heart disease with heart failure ([place])   3. Mixed hyperlipidemia    PLAN:    In order of problems listed above:  Overall her course is stable she has declined ICD therapy advanced medications like intramuscular heart failure is compensated and I think her symptoms are noncardiac and no indication of acute coronary syndrome. We discussed a repeat ischemia evaluation with patient and her husband feel is not necessary at this time and I agree Continue her current treatment including low-dose aspirin ACE inhibitor and beta-blocker along with lipid-lowering with statin   Next appointment: 3 months   Medication Adjustments/Labs and Tests Ordered: Current medicines are reviewed at length with the patient today.  Concerns regarding medicines are outlined above.  No orders of the defined types were placed in this encounter.  No orders of the defined types were placed in this encounter.   No chief complaint on file.   History of Present Illness:    Nichole Johnson is a 77 y.o. 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   She was seen in ED [place] with what was described as upper abdominal discomfort and belching being relieved with Pepcid.  During the evaluation she had a high-sensitivity troponin was mildly elevated without a delta she had an echocardiogram confirming severely reduced ejection fraction and was discharged to follow-up as outpatient laboratory studies  showed a sodium diminished at 130 potassium borderline 3.5 creatinine elevated 1.27 with a GFR 43 cc/min hemoglobin 12.4 BNP level elevated at 427 she had a CT angio of the chest showing no finding of pulmonary embolism EKG is described as sinus rhythm with nonspecific ST and T wave abnormality.  Compliance with diet, lifestyle and medications: Yes  Symptoms occurred in the context of recent knee surgery she was taking a nonsteroidal anti-inflammatory drugs she had diarrhea and severe crampy abdominal pain. The patient and her husband became concerned and went to the emergency room for reassurance Discussed the value of a high-sensitivity troponin and no change in the delta. She is not having anginal discomfort she had a previous ischemia evaluation in 2018 at [facility] does not have known coronary disease. 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 Pt was given copy of recent ECHO report.  Last Assessment & Plan:  Pt will f/u w/ Cardiology re this. Pt was given copy  of recent ECHO report.    Obstructive sleep apnea (adult) (pediatric) 07/08/2016   Personal history of radiation therapy 2001   Left Breast Cancer   Reactive airway disease without complication [id number]    Past Surgical History:  Procedure Laterality Date   BREAST LUMPECTOMY Left 2001   CESAREAN SECTION     ROTATOR CUFF REPAIR Bilateral    SHOULDER CLOSED REDUCTION      Current Medications: Current Meds  Medication Sig   albuterol (PROVENTIL HFA;VENTOLIN HFA) 108 (90 Base) MCG/ACT inhaler Inhale 2 puffs into the lungs every 6 (six) hours as needed for wheezing or shortness of breath.   alendronate (FOSAMAX) 5 MG tablet Take 5 mg by mouth once a week. Take with a full glass of water on an empty stomach.   aspirin EC (ASPIRIN 81) 81 MG tablet Take 1 tablet (81 mg total) by mouth every morning. (Patient taking differently: Take 81 mg by mouth 2 (two) times daily.)   brinzolamide (AZOPT) 1 % ophthalmic suspension Place 1 drop into both eyes 2 (two) times daily.   Calcium Carbonate-Vitamin D (CALCIUM + D PO) Take 1 tablet by mouth 2 (two) times daily.    Cholecalciferol 25 MCG (1000 UT) tablet Take 1,000 Units by mouth daily.    ferrous sulfate 325 (65 FE) MG tablet Take 325 mg by mouth daily.   fluticasone (FLONASE) 50 MCG/ACT nasal spray Place 2 sprays into both nostrils daily.   levothyroxine (SYNTHROID, LEVOTHROID) 100 MCG tablet Take 100 mcg by mouth daily.   lisinopril (ZESTRIL) 2.5 MG tablet TAKE 1 TABLET EVERY DAY   Loratadine 10 MG CAPS Take 10 mg by mouth daily.   metoprolol succinate (TOPROL-XL) 50 MG 24 hr tablet Take 1 tablet (50 mg total) by mouth daily.   Multiple Vitamins-Minerals (CENTRUM SILVER PO) Take by mouth every morning.   psyllium (METAMUCIL) 58.6 % packet Take 1 packet by mouth daily as needed.    simvastatin (ZOCOR) 20 MG tablet simvastatin 20 mg tablet  TAKE 1 TABLET BY MOUTH ONCE DAILY AT NIGHT   Travoprost, BAK Free, (TRAVATAN) 0.004 % SOLN ophthalmic  solution Place 1 drop into both eyes at bedtime.   vitamin B-12 (CYANOCOBALAMIN) 1000 MCG tablet Take 1,000 mcg by mouth daily.   vitamin C (ASCORBIC ACID) 500 MG tablet Take 500 mg by mouth daily.   [DISCONTINUED] torsemide (DEMADEX) 10 MG tablet Take 10 mg by mouth 2 (two) times daily.     Allergies:   Penicillins, Sulfamethoxazole-trimethoprim, and Tramadol   Social History   Socioeconomic History   Marital status: Married    Spouse name: Not on file   Number of children: Not on file   Years of education: Not on file   Highest education level: Not on file  Occupational History   Not on file  Tobacco Use   Smoking status: Never    Passive exposure: Never   Smokeless tobacco: Never   Tobacco comments:    NEVER USED TOBACCO  Vaping Use   Vaping Use: Never used  Substance and Sexual Activity   Alcohol use: Yes    Alcohol/week: 0.0 standard drinks  of alcohol    Comment: occ   Drug use: No   Sexual activity: Not on file  Other Topics Concern   Not on file  Social History Narrative   Not on file   Social Determinants of Health   Financial Resource Strain: Not on file  Food Insecurity: Not on file  Transportation Needs: Not on file  Physical Activity: Not on file  Stress: Not on file  Social Connections: Not on file     Family History: The patient's family history includes Hypertension in her mother. There is no history of Heart attack, Stroke, or Diabetes. ROS:   Please see the history of present illness.    All other systems reviewed and are negative.  EKGs/Labs/Other Studies Reviewed:    The following studies were reviewed today:  Cardiac Studies & Procedures       ECHOCARDIOGRAM  ECHOCARDIOGRAM COMPLETE 12/18/2020  Narrative ECHOCARDIOGRAM REPORT    Patient Name:   Nichole Johnson  Date of Exam: 12/18/2020 Medical Rec #:  [id number]     Height:       59.0 in Accession #:    [id number]    Weight:       133.0 lb Date of Birth:  November 05, 1945     BSA:           1.551 m Patient Age:    3 years      BP:           115/74 mmHg Patient Gender: F             HR:           85 bpm. Exam Location:  [address]  Procedure: 2D Echo, Cardiac Doppler, Color Doppler and Intracardiac Opacification Agent  Indications:    I50.9* Heart failure (unspecified)  History:        Patient has no prior history of Echocardiogram examinations. CHF; Risk Factors:Hypertension and Dyslipidemia. Dilated cardiomyopathy. Hypotension. Obstructive sleep apnea. Hypothyroidism.  Sonographer:    Tyrone Nine Referring Phys: [phone] Marceline   1. The left ventricle is not dilated, but exhibits spherical remodeling. No thrombus is seen (with Definity). Left ventricular ejection fraction, by estimation, is 30 to 35%. The left ventricle has moderately decreased function. The left ventricle demonstrates global hypokinesis. Left ventricular diastolic parameters are consistent with Grade I diastolic dysfunction (impaired relaxation). 2. Right ventricular systolic function is normal. The right ventricular size is normal. There is normal pulmonary artery systolic pressure. The estimated right ventricular systolic pressure is [id number] mmHg. 3. The mitral valve is normal in structure. Trivial mitral valve regurgitation. No evidence of mitral stenosis. 4. Tricuspid valve regurgitation is moderate. 5. The aortic valve is tricuspid. Aortic valve regurgitation is not visualized. No aortic stenosis is present.  FINDINGS Left Ventricle: The left ventricle is not dilated, but exhibits spherical remodeling. No thrombus is seen (with Definity). Left ventricular ejection fraction, by estimation, is 30 to 35%. The left ventricle has moderately decreased function. The left ventricle demonstrates global hypokinesis. The left ventricular internal cavity size was normal in size. There is no left ventricular hypertrophy. Left ventricular diastolic parameters are consistent with Grade I  diastolic dysfunction (impaired relaxation). Normal left ventricular filling pressure.  Right Ventricle: The right ventricular size is normal. No increase in right ventricular wall thickness. Right ventricular systolic function is normal. There is normal pulmonary artery systolic pressure. The tricuspid regurgitant velocity is 2.23 m/s, and with an assumed right atrial pressure of 3  mmHg, the estimated right ventricular systolic pressure is [id number] mmHg.  Left Atrium: Left atrial size was normal in size.  Right Atrium: Right atrial size was normal in size.  Pericardium: There is no evidence of pericardial effusion.  Mitral Valve: The mitral valve is normal in structure. There is mild thickening of the mitral valve leaflet(s). Trivial mitral valve regurgitation. No evidence of mitral valve stenosis.  Tricuspid Valve: The tricuspid valve is normal in structure. Tricuspid valve regurgitation is moderate.  Aortic Valve: The aortic valve is tricuspid. Aortic valve regurgitation is not visualized. No aortic stenosis is present.  Pulmonic Valve: The pulmonic valve was normal in structure. Pulmonic valve regurgitation is not visualized.  Aorta: The aortic root and ascending aorta are structurally normal, with no evidence of dilitation.  IAS/Shunts: No atrial level shunt detected by color flow Doppler.   LEFT VENTRICLE PLAX 2D LVIDd:         5.10 cm      Diastology LVIDs:         4.10 cm      LV e' medial:    5.44 cm/s LV PW:         1.00 cm      LV E/e' medial:  7.6 LV IVS:        0.70 cm      LV e' lateral:   4.68 cm/s LVOT diam:     1.95 cm      LV E/e' lateral: 8.9 LV SV:         30 LV SV Index:   20 LVOT Area:     2.99 cm  LV Volumes (MOD) LV vol d, MOD A4C: 115.8 ml LV vol s, MOD A4C: 80.3 ml LV SV MOD A4C:     35.5 ml  RIGHT VENTRICLE RV Basal diam:  2.60 cm RV S prime:     14.60 cm/s TAPSE (M-mode): 1.9 cm RVSP:           22.9 mmHg  LEFT ATRIUM             Index        RIGHT ATRIUM           Index LA diam:        2.50 cm 1.61 cm/m  RA Pressure: 3.00 mmHg LA Vol (A2C):   29.1 ml 18.77 ml/m RA Area:     9.26 cm LA Vol (A4C):   39.1 ml 25.21 ml/m RA Volume:   15.70 ml  10.12 ml/m LA Biplane Vol: 34.6 ml 22.31 ml/m AORTIC VALVE LVOT Vmax:   62.10 cm/s LVOT Vmean:  37.900 cm/s LVOT VTI:    0.102 m  AORTA Ao Root diam: 2.90 cm Ao Asc diam:  2.90 cm  MITRAL VALVE               TRICUSPID VALVE MV Area (PHT): 5.46 cm    TR Peak grad:   19.9 mmHg MV Decel Time: 139 msec    TR Vmax:        223.00 cm/s MV E velocity: 41.40 cm/s  Estimated RAP:  3.00 mmHg MV A velocity: 91.20 cm/s  RVSP:           22.9 mmHg MV E/A ratio:  0.45 SHUNTS Systemic VTI:  0.10 m Systemic Diam: 1.95 cm  Dani Gobble Croitoru MD Electronically signed by Sanda Klein MD Signature Date/Time: 12/18/2020/5:50:13 PM    Final      CARDIAC MRI  MR CARDIAC MORPHOLOGY W WO  CONTRAST 07/17/2019  Narrative CLINICAL DATA:  Cardiomyopathy suspected  COMPARISON: None available  EXAM: CARDIAC MRI  TECHNIQUE: The patient was scanned on a 1.5 Tesla GE magnet. A dedicated cardiac coil was used. Functional imaging was done using Fiesta sequences. 2,3, and 4 chamber views were done to assess for RWMA's. Modified Simpson's rule using a short axis stack was used to calculate an ejection fraction on a dedicated work Conservation officer, nature. The patient received 78mL GADAVIST GADOBUTROL 1 MMOL/ML IV SOLN. After 10 minutes inversion recovery sequences were used to assess for infiltration and scar tissue.  CONTRAST:  21mL GADAVIST GADOBUTROL 1 MMOL/ML IV SOLN  FINDINGS: LEFT VENTRICLE:  Normal left ventricular size, thickness. Severely reduced systolic function (LVEF = 20%). Severe global hypokinesis with mild dyssynchrony of the ventricular septum.  There is late gadolinium enhancement in the left ventricular myocardium in the mid to apical anterior wall, and  appears transmural, suggesting prior infarct with no viability in this region.  Normal T1 myocardial nulling kinetics suggests against cardiac amyloidosis.  No LV thrombus.  ECV = 27%  RIGHT VENTRICLE:  Normal right ventricular size, thickness. Mild-moderately reduced systolic function (RVEF = 34%). There are no regional wall motion abnormalities.  ATRIA:  Mild LA enlargement.  Normal RA size.  VALVES:  Not well visualized due to motion artifact.  PERICARDIUM:  Normal pericardium.  No pericardial effusion.  OTHER: Moderate size esophageal hiatal hernia incidentally noted. Recommend dedicated imaging to further assess. This finding appears to distort the LV base inferiorly.  MEASUREMENTS:  LVEDD: 47 mm  LVESD: 42 mm  LVEDV: 102 ml  LVESV: 82 ml  SV: 20 ml  CO: 1.6 L/min  Myocardial mass: 80 g  RVEDV: 45 ml  RVEDS: 30 ml  RVSV: 15 mL  IMPRESSION: 1. Normal left ventricular chamber size with severely reduced systolic function. LVEF 20%.  2. Normal right ventricular chamber size, mild-moderately reduced systolic function. RVEF 34%.  3. Transmural delayed myocardial enhancement mid-apical LV anterior wall, suggestive of prior infarct, with no viability in this distribution.  4.  No LV apical thrombus.  5. Moderate size hiatal hernia incidentally noted. Consider dedicated imaging to further assess.   Electronically Signed By: Cherlynn Kaiser On: 07/17/2019 05:40          Recent Labs: 02/25/2022: ALT 16; BUN 19; Creatinine, Ser 1.07; NT-Pro BNP 1,722; Potassium 4.5; Sodium 140  Recent Lipid Panel    Component Value Date/Time   CHOL 168 02/25/2022 1144   TRIG 105 02/25/2022 1144   HDL 65 02/25/2022 1144   CHOLHDL 2.6 02/25/2022 1144   LDLCALC 84 02/25/2022 1144    Physical Exam:    VS:  BP 102/68 (BP Location: Right Arm, Patient Position: Sitting)   Pulse 92   Ht 4\' 11"  (1.499 m)   Wt 128 lb 6.4 oz (58.2 kg)   SpO2 96%   BMI  25.93 kg/m     Wt Readings from Last 3 Encounters:  07/06/22 128 lb 6.4 oz (58.2 kg)  02/25/22 131 lb 12.8 oz (59.8 kg)  09/30/21 128 lb 9.6 oz (58.3 kg)     GEN:  Well nourished, well developed in no acute distress HEENT: Normal NECK: No JVD; No carotid bruits LYMPHATICS: No lymphadenopathy CARDIAC: RRR, no murmurs, rubs, gallops RESPIRATORY:  Clear to auscultation without rales, wheezing or rhonchi  ABDOMEN: Soft, non-tender, non-distended MUSCULOSKELETAL:  No edema; No deformity  SKIN: Warm and dry NEUROLOGIC:  Alert and oriented x 3 PSYCHIATRIC:  Normal affect    Signed, Shirlee More, MD  07/06/2022 8:41 AM    [facility]

## 2022-07-06 ENCOUNTER — Encounter: Payer: Self-pay | Admitting: Cardiology

## 2022-07-06 ENCOUNTER — Other Ambulatory Visit: Payer: Self-pay

## 2022-07-06 ENCOUNTER — Ambulatory Visit: Payer: Medicare HMO | Attending: Cardiology | Admitting: Cardiology

## 2022-07-06 VITALS — BP 102/68 | HR 92 | Ht 59.0 in | Wt 128.4 lb

## 2022-07-06 DIAGNOSIS — E782 Mixed hyperlipidemia: Secondary | ICD-10-CM | POA: Diagnosis not present

## 2022-07-06 DIAGNOSIS — I420 Dilated cardiomyopathy: Secondary | ICD-10-CM

## 2022-07-06 DIAGNOSIS — I110 Hypertensive heart disease with heart failure: Secondary | ICD-10-CM | POA: Diagnosis not present

## 2022-07-06 MED ORDER — TORSEMIDE 10 MG PO TABS: 10.0000 mg | ORAL_TABLET | Freq: Two times a day (BID) | ORAL | 3 refills | Status: DC

## 2022-07-06 NOTE — Patient Instructions (Signed)
Medication Instructions:  Your physician recommends that you continue on your current medications as directed. Please refer to the Current Medication list given to you today.  *If you need a refill on your cardiac medications before your next appointment, please call your pharmacy*   Lab Work: None If you have labs (blood work) drawn today and your tests are completely normal, you will receive your results only by: MyChart Message (if you have MyChart) OR A paper copy in the mail If you have any lab test that is abnormal or we need to change your treatment, we will call you to review the results.   Testing/Procedures: None   Follow-Up: At [facility], you and your health needs are our priority.  As part of our continuing mission to provide you with exceptional heart care, we have created designated Provider Care Teams.  These Care Teams include your primary Cardiologist (physician) and Advanced Practice Providers (APPs -  Physician Assistants and Nurse Practitioners) who all work together to provide you with the care you need, when you need it.  We recommend signing up for the patient portal called "MyChart".  Sign up information is provided on this After Visit Summary.  MyChart is used to connect with patients for Virtual Visits (Telemedicine).  Patients are able to view lab/test results, encounter notes, upcoming appointments, etc.  Non-urgent messages can be sent to your provider as well.   To learn more about what you can do with MyChart, go to [email or web address].    Your next appointment:   3 month(s)  Provider:   Brian Munley, MD    Other Instructions None  

## 2022-07-28 ENCOUNTER — Telehealth: Payer: Self-pay | Admitting: Cardiology

## 2022-07-28 NOTE — Telephone Encounter (Signed)
Called patient and she reported that she was experiencing SOB since yesterday when moving around. She states that she hasn't started anything new since her knee surgery at the end of February. Patient was working with physical therapy and had to stop multiple times to rest. Her latest blood pressure was 105/72 which she states is normal for her. Please advise.

## 2022-07-28 NOTE — Telephone Encounter (Signed)
Pt c/o Shortness Of Breath: STAT if SOB developed within the last 24 hours or pt is noticeably SOB on the phone  1. Are you currently SOB (can you hear that pt is SOB on the phone)? Yes... she states she feels ok.    2. How long have you been experiencing SOB? Since yesterday  3. Are you SOB when sitting or when up moving around? Moving around  4. Are you currently experiencing any other symptoms? She denies lightheadedness, dizziness, loss of balance, cp, headache, nausea, vomiting. She states she does have fatigue - end of February she had a knee replacement. Thinks the fatigue is related to that, but isn't sure.    Call transferred to triage.

## 2022-07-29 ENCOUNTER — Other Ambulatory Visit: Payer: Self-pay

## 2022-07-29 DIAGNOSIS — I5022 Chronic systolic (congestive) heart failure: Secondary | ICD-10-CM

## 2022-07-29 NOTE — Telephone Encounter (Signed)
Called patient and informed her of Dr. Vanetta Shawl recommendation below:  "Lets check proBNP and D-dimer on her also Chem-7"  Patient was agreeable with having the labs drawn and had no further questions at this time.

## 2022-07-30 LAB — PRO B NATRIURETIC PEPTIDE: NT-Pro BNP: 5708 pg/mL — ABNORMAL HIGH (ref 0–738)

## 2022-07-30 LAB — BASIC METABOLIC PANEL
BUN/Creatinine Ratio: 11 — ABNORMAL LOW (ref 12–28)
BUN: 13 mg/dL (ref 8–27)
CO2: 22 mmol/L (ref 20–29)
Calcium: 9.6 mg/dL (ref 8.7–10.3)
Chloride: 98 mmol/L (ref 96–106)
Creatinine, Ser: 1.14 mg/dL — ABNORMAL HIGH (ref 0.57–1.00)
Glucose: 147 mg/dL — ABNORMAL HIGH (ref 70–99)
Potassium: 5.1 mmol/L (ref 3.5–5.2)
Sodium: 138 mmol/L (ref 134–144)
eGFR: 49 mL/min/{1.73_m2} — ABNORMAL LOW (ref >59)

## 2022-07-30 LAB — D-DIMER, QUANTITATIVE: D-DIMER: 2.75 mg/L FEU — ABNORMAL HIGH (ref 0.00–0.49)

## 2022-08-03 ENCOUNTER — Telehealth: Payer: Self-pay | Admitting: Cardiology

## 2022-08-03 NOTE — Telephone Encounter (Signed)
Patient is calling to follow up on lab results.

## 2022-08-04 ENCOUNTER — Ambulatory Visit: Payer: Medicare HMO | Admitting: Cardiology

## 2022-08-06 ENCOUNTER — Encounter (HOSPITAL_BASED_OUTPATIENT_CLINIC_OR_DEPARTMENT_OTHER): Payer: Self-pay | Admitting: Pediatrics

## 2022-08-06 ENCOUNTER — Emergency Department (HOSPITAL_BASED_OUTPATIENT_CLINIC_OR_DEPARTMENT_OTHER)
Admission: EM | Admit: 2022-08-06 | Discharge: 2022-08-06 | Disposition: A | Discharge status: Home / Self Care | Payer: Medicare HMO | Attending: Emergency Medicine | Admitting: Emergency Medicine

## 2022-08-06 ENCOUNTER — Other Ambulatory Visit: Payer: Self-pay

## 2022-08-06 ENCOUNTER — Ambulatory Visit (HOSPITAL_BASED_OUTPATIENT_CLINIC_OR_DEPARTMENT_OTHER)
Admission: RE | Admit: 2022-08-06 | Discharge: 2022-08-06 | Disposition: A | Discharge status: Home / Self Care | Payer: Medicare HMO | Source: Ambulatory Visit | Attending: Cardiology | Admitting: Cardiology

## 2022-08-06 ENCOUNTER — Telehealth: Payer: Self-pay

## 2022-08-06 DIAGNOSIS — R2243 Localized swelling, mass and lump, lower limb, bilateral: Secondary | ICD-10-CM | POA: Diagnosis not present

## 2022-08-06 DIAGNOSIS — R011 Cardiac murmur, unspecified: Secondary | ICD-10-CM | POA: Insufficient documentation

## 2022-08-06 DIAGNOSIS — Z7982 Long term (current) use of aspirin: Secondary | ICD-10-CM | POA: Insufficient documentation

## 2022-08-06 DIAGNOSIS — R0602 Shortness of breath: Secondary | ICD-10-CM

## 2022-08-06 DIAGNOSIS — I509 Heart failure, unspecified: Secondary | ICD-10-CM | POA: Insufficient documentation

## 2022-08-06 DIAGNOSIS — Z79899 Other long term (current) drug therapy: Secondary | ICD-10-CM | POA: Insufficient documentation

## 2022-08-06 DIAGNOSIS — I110 Hypertensive heart disease with heart failure: Secondary | ICD-10-CM | POA: Insufficient documentation

## 2022-08-06 DIAGNOSIS — J810 Acute pulmonary edema: Secondary | ICD-10-CM | POA: Insufficient documentation

## 2022-08-06 DIAGNOSIS — Z7951 Long term (current) use of inhaled steroids: Secondary | ICD-10-CM | POA: Insufficient documentation

## 2022-08-06 DIAGNOSIS — I501 Left ventricular failure: Secondary | ICD-10-CM

## 2022-08-06 LAB — CBC WITH DIFFERENTIAL/PLATELET
Abs Immature Granulocytes: 0.04 10*3/uL (ref 0.00–0.07)
Basophils Absolute: 0.1 10*3/uL (ref 0.0–0.1)
Basophils Relative: 1 %
Eosinophils Absolute: 0.5 10*3/uL (ref 0.0–0.5)
Eosinophils Relative: 5 %
HCT: 37.5 % (ref 36.0–46.0)
Hemoglobin: 11.8 g/dL — ABNORMAL LOW (ref 12.0–15.0)
Immature Granulocytes: 0 %
Lymphocytes Relative: 19 %
Lymphs Abs: 2.1 10*3/uL (ref 0.7–4.0)
MCH: 26.9 pg (ref 26.0–34.0)
MCHC: 31.5 g/dL (ref 30.0–36.0)
MCV: 85.4 fL (ref 80.0–100.0)
Monocytes Absolute: 0.9 10*3/uL (ref 0.1–1.0)
Monocytes Relative: 8 %
Neutro Abs: 7.7 10*3/uL (ref 1.7–7.7)
Neutrophils Relative %: 67 %
Platelets: 386 10*3/uL (ref 150–400)
RBC: 4.39 MIL/uL (ref 3.87–5.11)
RDW: 16.2 % — ABNORMAL HIGH (ref 11.5–15.5)
WBC: 11.3 10*3/uL — ABNORMAL HIGH (ref 4.0–10.5)
nRBC: 0 % (ref 0.0–0.2)

## 2022-08-06 LAB — COMPREHENSIVE METABOLIC PANEL
ALT: 81 U/L — ABNORMAL HIGH (ref 0–44)
AST: 99 U/L — ABNORMAL HIGH (ref 15–41)
Albumin: 3.9 g/dL (ref 3.5–5.0)
Alkaline Phosphatase: 55 U/L (ref 38–126)
Anion gap: 11 (ref 5–15)
BUN: 24 mg/dL — ABNORMAL HIGH (ref 8–23)
CO2: 24 mmol/L (ref 22–32)
Calcium: 8.8 mg/dL — ABNORMAL LOW (ref 8.9–10.3)
Chloride: 98 mmol/L (ref 98–111)
Creatinine, Ser: 0.98 mg/dL (ref 0.44–1.00)
GFR, Estimated: 59 mL/min — ABNORMAL LOW (ref >60)
Glucose, Bld: 111 mg/dL — ABNORMAL HIGH (ref 70–99)
Potassium: 3.9 mmol/L (ref 3.5–5.1)
Sodium: 133 mmol/L — ABNORMAL LOW (ref 135–145)
Total Bilirubin: 0.8 mg/dL (ref 0.3–1.2)
Total Protein: 7.1 g/dL (ref 6.5–8.1)

## 2022-08-06 LAB — TROPONIN I (HIGH SENSITIVITY): Troponin I (High Sensitivity): 17 ng/L (ref <18)

## 2022-08-06 LAB — BRAIN NATRIURETIC PEPTIDE: B Natriuretic Peptide: 1243.8 pg/mL — ABNORMAL HIGH (ref 0.0–100.0)

## 2022-08-06 MED ORDER — IOHEXOL 350 MG/ML SOLN
100.0000 mL | Freq: Once | INTRAVENOUS | Status: AC | PRN
  Administered 2022-08-06: 100 mL via INTRAVENOUS

## 2022-08-06 MED ORDER — FUROSEMIDE 10 MG/ML IJ SOLN
20.0000 mg | Freq: Once | INTRAMUSCULAR | Status: AC
  Administered 2022-08-06: 20 mg via INTRAVENOUS
  Filled 2022-08-06: qty 2

## 2022-08-06 NOTE — ED Provider Notes (Incomplete)
[place] [facility] Provider Note   CSN: [id number] Arrival date & time: 08/06/22  1811     History {Add pertinent medical, surgical, social history, OB history to HPI:1} Chief Complaint  Patient presents with  . Shortness of Breath    Beula Joyner is a 77 y.o. female.   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 Dulce Sellar) for elevated D-dimer.  Patient had difficulty breathing during the scan.  Of note, most recent echo was performed September 2022 and revealed LVEF 30 to 35% with moderately decreased LV function and global hypokinesis.       Home Medications Prior to Admission medications   Medication Sig Start Date End Date Taking? Authorizing Provider  albuterol (PROVENTIL HFA;VENTOLIN HFA) 108 (90 Base) MCG/ACT inhaler Inhale 2 puffs into the lungs every 6 (six) hours as needed for wheezing or shortness of breath. 01/01/16   Mannam, Colbert Coyer, MD  alendronate (FOSAMAX) 5 MG tablet Take 5 mg by mouth once a week. Take with a full glass of water on an empty stomach.    [provider]  aspirin EC (ASPIRIN 81) 81 MG tablet Take 1 tablet (81 mg total) by mouth every morning. Patient taking differently: Take 81 mg by mouth 2 (two) times daily. 11/06/21   Georgeanna Lea, MD  brinzolamide (AZOPT) 1 % ophthalmic suspension Place 1 drop into both eyes 2 (two) times daily.    [provider]  Calcium Carbonate-Vitamin D (CALCIUM + D PO) Take 1 tablet by mouth 2 (two) times daily.     [provider]  Cholecalciferol 25 MCG (1000 UT) tablet Take 1,000 Units by mouth daily.     [provider]  ferrous sulfate 325 (65 FE) MG tablet Take 325 mg by mouth daily.    [provider]  fluticasone (FLONASE) 50 MCG/ACT nasal spray Place 2 sprays  into both nostrils daily. 01/01/16   Mannam, Colbert Coyer, MD  levothyroxine (SYNTHROID, LEVOTHROID) 100 MCG tablet Take 100 mcg by mouth daily. 07/20/16   [provider]  lisinopril (ZESTRIL) 2.5 MG tablet TAKE 1 TABLET EVERY DAY 09/05/21   Baldo Daub, MD  Loratadine 10 MG CAPS Take 10 mg by mouth daily.    [provider]  metoprolol succinate (TOPROL-XL) 50 MG 24 hr tablet Take 1 tablet (50 mg total) by mouth daily. 03/16/22   Baldo Daub, MD  Multiple Vitamins-Minerals (CENTRUM SILVER PO) Take by mouth every morning.    [provider]  psyllium (METAMUCIL) 58.6 % packet Take 1 packet by mouth daily as needed.     [provider]  simvastatin (ZOCOR) 20 MG tablet simvastatin 20 mg tablet  TAKE 1 TABLET BY MOUTH ONCE DAILY AT NIGHT    [provider]  torsemide (DEMADEX) 10 MG tablet Take 1 tablet (10 mg total) by mouth 2 (two) times daily. 07/06/22   Baldo Daub, MD  Travoprost, BAK Free, (TRAVATAN) 0.004 % SOLN ophthalmic solution Place 1 drop into both eyes at bedtime.    [provider]  vitamin B-12 (CYANOCOBALAMIN) 1000 MCG tablet Take 1,000 mcg by mouth daily.    [provider]  vitamin C (ASCORBIC ACID) 500 MG tablet Take 500 mg by mouth daily.    [provider]      Allergies    Penicillins,  Sulfamethoxazole-trimethoprim, and Tramadol    Review of Systems   Review of Systems  Respiratory:  Positive for shortness of breath.     Physical Exam Updated Vital Signs BP 105/76 (BP Location: Right Arm)   Pulse 95   Temp 98 F (36.7 C)   Resp 20   Ht  (1.499 m)   Wt 59 kg   SpO2 100%   BMI 26.26 kg/m  Physical Exam  ED Results / Procedures / Treatments   Labs (all labs ordered are listed, but only abnormal results are displayed) Labs Reviewed - No data to display  EKG None  Radiology CT Angio Chest Pulmonary Embolism (PE) W or WO Contrast  Result Date: 08/06/2022 CLINICAL DATA:   Shortness of breath, elevated D-dimer EXAM: CT ANGIOGRAPHY CHEST WITH CONTRAST TECHNIQUE: Multidetector CT imaging of the chest was performed using the standard protocol during bolus administration of intravenous contrast. Multiplanar CT image reconstructions and MIPs were obtained to evaluate the vascular anatomy. RADIATION DOSE REDUCTION: This exam was performed according to the departmental dose-optimization program which includes automated exposure control, adjustment of the mA and/or kV according to patient size and/or use of iterative reconstruction technique. CONTRAST:  OMNIPAQUE IOHEXOL 350 MG/ML SOLN COMPARISON:  06/30/2022 chest CT FINDINGS: Cardiovascular: No filling defect is identified in the pulmonary arterial tree to suggest pulmonary embolus. Mild atherosclerotic calcification of the aortic arch and descending thoracic aorta. Mild cardiomegaly. Mediastinum/Nodes: 0.8 cm right infrahilar lymph node on image 48 series 4. No overt pathologic adenopathy identified in the chest. Moderate-sized hiatal hernia.  This bulges into the left chest. Lungs/Pleura: Increased peripheral interstitial accentuation along secondary pulmonary lobular septa favoring interstitial edema. New small bilateral pleural effusions with associated passive atelectasis. Upper Abdomen: Unremarkable Musculoskeletal: Thoracic spondylosis. Review of the MIP images confirms the above findings. IMPRESSION: 1. No filling defect is identified in the pulmonary arterial tree to suggest pulmonary embolus. 2. New small bilateral pleural effusions with associated passive atelectasis. 3. Increased peripheral interstitial accentuation along secondary pulmonary lobular septa favoring interstitial pulmonary edema. Mild cardiomegaly noted. 4. Moderate-sized hiatal hernia. 5. Aortic atherosclerosis. Aortic Atherosclerosis (ICD10-I70.0). Electronically Signed   By: Gaylyn Rong M.D.   On: 08/06/2022 17:57    Procedures Procedures   {Document cardiac monitor, telemetry assessment procedure when appropriate:1}  Medications Ordered in ED Medications - No data to display  ED Course/ Medical Decision Making/ A&P   {   Click here for ABCD2, HEART and other calculatorsREFRESH Note before signing :1}                          Medical Decision Making  ***  {Document critical care time when appropriate:1} {Document review of labs and clinical decision tools ie heart score, Chads2Vasc2 etc:1}  {Document your independent review of radiology images, and any outside records:1} {Document your discussion with family members, caretakers, and with consultants:1} {Document social determinants of health affecting pt's care:1} {Document your decision making why or why not admission, treatments were needed:1} Final Clinical Impression(s) / ED Diagnoses Final diagnoses:  None    Rx / DC Orders ED Discharge Orders     None

## 2022-08-06 NOTE — ED Provider Notes (Signed)
[place] [facility] Provider Note   CSN: [id number] Arrival date & time: 08/06/22  1811     History  Chief Complaint  Patient presents with   Shortness of Breath    Nichole Johnson is a 77 y.o. female.  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  Patient has been feeling shortness of breath x 1 week with accompanying chest pressure.  Denies worsening of swelling of her legs, chest pain, recent illness.  She contacted her cardiologist office (Dr. Dulce Sellar) who obtained lab work and given she had an elevated D-dimer, advised CTA to evaluate for PE.  Patient was advised to proceed to ED afterwards.   Shortness of Breath      Home Medications Prior to Admission medications   Medication Sig Start Date End Date Taking? Authorizing Provider  albuterol (PROVENTIL HFA;VENTOLIN HFA) 108 (90 Base) MCG/ACT inhaler Inhale 2 puffs into the lungs every 6 (six) hours as needed for wheezing or shortness of breath. 01/01/16   Mannam, Colbert Coyer, MD  alendronate (FOSAMAX) 5 MG tablet Take 5 mg by mouth once a week. Take with a full glass of water on an empty stomach.    [provider]  aspirin EC (ASPIRIN 81) 81 MG tablet Take 1 tablet (81 mg total) by mouth every morning. Patient taking differently: Take 81 mg by mouth 2 (two) times daily. 11/06/21   Georgeanna Lea, MD  brinzolamide (AZOPT) 1 % ophthalmic suspension Place 1 drop into both eyes 2 (two) times daily.    [provider]  Calcium Carbonate-Vitamin D (CALCIUM + D PO) Take 1 tablet by mouth 2 (two) times daily.     [provider]  Cholecalciferol 25 MCG (1000 UT) tablet Take 1,000 Units by mouth daily.     [provider]  ferrous sulfate 325 (65 FE) MG tablet Take 325 mg by mouth daily.    [provider]  fluticasone (FLONASE) 50 MCG/ACT nasal spray Place 2  sprays into both nostrils daily. 01/01/16   Mannam, Colbert Coyer, MD  levothyroxine (SYNTHROID, LEVOTHROID) 100 MCG tablet Take 100 mcg by mouth daily. 07/20/16   [provider]  lisinopril (ZESTRIL) 2.5 MG tablet TAKE 1 TABLET EVERY DAY 09/05/21   Baldo Daub, MD  Loratadine 10 MG CAPS Take 10 mg by mouth daily.    [provider]  metoprolol succinate (TOPROL-XL) 50 MG 24 hr tablet Take 1 tablet (50 mg total) by mouth daily. 03/16/22   Baldo Daub, MD  Multiple Vitamins-Minerals (CENTRUM SILVER PO) Take by mouth every morning.    [provider]  psyllium (METAMUCIL) 58.6 % packet Take 1 packet by mouth daily as needed.     [provider]  simvastatin (ZOCOR) 20 MG tablet simvastatin 20 mg tablet  TAKE 1 TABLET BY MOUTH ONCE DAILY AT NIGHT    [provider]  torsemide (DEMADEX) 10 MG tablet Take 1 tablet (10 mg total) by mouth 2 (two) times daily. 07/06/22   Baldo Daub, MD  Travoprost, BAK Free, (TRAVATAN) 0.004 % SOLN ophthalmic solution Place 1 drop into both eyes at bedtime.    [provider]  vitamin B-12 (CYANOCOBALAMIN) 1000 MCG tablet Take 1,000 mcg by mouth daily.    [provider]  vitamin C (ASCORBIC ACID) 500 MG tablet Take 500 mg by mouth daily.    [provider]  Allergies    Penicillins, Sulfamethoxazole-trimethoprim, and Tramadol    Review of Systems   Review of Systems  Respiratory:  Positive for shortness of breath.   All other systems reviewed and are negative.   Physical Exam Updated Vital Signs BP 101/72   Pulse 88   Temp 98 F (36.7 C)   Resp (!) 21   Ht  (1.499 m)   Wt 59 kg   SpO2 98%   BMI 26.26 kg/m  Physical Exam Vitals reviewed.  Constitutional:      General: She is not in acute distress.    Appearance: She is well-developed. She is not ill-appearing or toxic-appearing.  Cardiovascular:     Rate and Rhythm: Normal rate and regular rhythm.     Heart sounds:  Murmur (3/6 systolic murmur) heard.  Pulmonary:     Effort: Pulmonary effort is normal. No respiratory distress.     Breath sounds: Examination of the right-lower field reveals decreased breath sounds. Examination of the left-lower field reveals decreased breath sounds. Decreased breath sounds present. No wheezing or rhonchi.  Chest:     Chest wall: No tenderness or crepitus.  Abdominal:     General: Bowel sounds are normal.     Palpations: Abdomen is soft.     Tenderness: There is no abdominal tenderness.  Musculoskeletal:     Cervical back: Normal range of motion.     Right lower leg: Edema (Trace) present.     Left lower leg: Edema (Trace) present.  Skin:    General: Skin is warm and dry.  Neurological:     Mental Status: She is alert and oriented to person, place, and time.  Psychiatric:        Mood and Affect: Mood normal.        Behavior: Behavior normal.     ED Results / Procedures / Treatments   Labs (all labs ordered are listed, but only abnormal results are displayed) Labs Reviewed  COMPREHENSIVE METABOLIC PANEL - Abnormal; Notable for the following components:      Result Value   Sodium 133 (*)    Glucose, Bld 111 (*)    BUN 24 (*)    Calcium 8.8 (*)    AST 99 (*)    ALT 81 (*)    GFR, Estimated 59 (*)    All other components within normal limits  BRAIN NATRIURETIC PEPTIDE - Abnormal; Notable for the following components:   B Natriuretic Peptide 1,243.8 (*)    All other components within normal limits  CBC WITH DIFFERENTIAL/PLATELET - Abnormal; Notable for the following components:   WBC 11.3 (*)    Hemoglobin 11.8 (*)    RDW 16.2 (*)    All other components within normal limits  TROPONIN I (HIGH SENSITIVITY)    EKG None  Radiology CT Angio Chest Pulmonary Embolism (PE) W or WO Contrast  Result Date: 08/06/2022 CLINICAL DATA:  Shortness of breath, elevated D-dimer EXAM: CT ANGIOGRAPHY CHEST WITH CONTRAST TECHNIQUE: Multidetector CT imaging of the  chest was performed using the standard protocol during bolus administration of intravenous contrast. Multiplanar CT image reconstructions and MIPs were obtained to evaluate the vascular anatomy. RADIATION DOSE REDUCTION: This exam was performed according to the departmental dose-optimization program which includes automated exposure control, adjustment of the mA and/or kV according to patient size and/or use of iterative reconstruction technique. CONTRAST:  OMNIPAQUE IOHEXOL 350 MG/ML SOLN COMPARISON:  06/30/2022 chest CT FINDINGS: Cardiovascular: No filling defect is identified in  the pulmonary arterial tree to suggest pulmonary embolus. Mild atherosclerotic calcification of the aortic arch and descending thoracic aorta. Mild cardiomegaly. Mediastinum/Nodes: 0.8 cm right infrahilar lymph node on image 48 series 4. No overt pathologic adenopathy identified in the chest. Moderate-sized hiatal hernia.  This bulges into the left chest. Lungs/Pleura: Increased peripheral interstitial accentuation along secondary pulmonary lobular septa favoring interstitial edema. New small bilateral pleural effusions with associated passive atelectasis. Upper Abdomen: Unremarkable Musculoskeletal: Thoracic spondylosis. Review of the MIP images confirms the above findings. IMPRESSION: 1. No filling defect is identified in the pulmonary arterial tree to suggest pulmonary embolus. 2. New small bilateral pleural effusions with associated passive atelectasis. 3. Increased peripheral interstitial accentuation along secondary pulmonary lobular septa favoring interstitial pulmonary edema. Mild cardiomegaly noted. 4. Moderate-sized hiatal hernia. 5. Aortic atherosclerosis. Aortic Atherosclerosis (ICD10-I70.0). Electronically Signed   By: Gaylyn Rong M.D.   On: 08/06/2022 17:57    Procedures Procedures    Medications Ordered in ED Medications  furosemide (LASIX) injection 20 mg (20 mg Intravenous Given 08/06/22 2038)    ED  Course/ Medical Decision Making/ A&P   Click here for ABCD2, HEART and other calculatorsREFRESH Note before signing :1}                          Medical Decision Making 77 year old female with significant cardiac history of CHF and dilated cardiomyopathy presenting after CTA PE scan with history of shortness of breath x 1 week.  CTA did not reveal pulmonary embolus but did reveal new small bilateral pleural effusions and interstitial pulmonary edema with mild cardiomegaly.  BNP elevated to 1200 however creatinine around baseline.  Troponin negative.  She was responsive to IV Lasix 20 mg.  She was able to ambulate appropriately without need for oxygen.  Did not meet criteria for admission and was stable for discharge for follow-up with cardiology tomorrow as already scheduled.  Amount and/or Complexity of Data Reviewed Labs: ordered.  Risk Prescription drug management.         Final Clinical Impression(s) / ED Diagnoses Final diagnoses:  Pulmonary edema cardiac cause [facility])    Rx / DC Orders ED Discharge Orders     None         Shelby Mattocks, DO 08/06/22 2141    Maia Plan, MD 08/18/22 1407

## 2022-08-06 NOTE — Telephone Encounter (Signed)
-----   Message from Baldo Daub, MD sent at 08/06/2022  9:18 AM EDT ----- This is the first time of seeing her labs I am very concerned about her elevated D-dimer I think today she should have a CTA done chest pulmonary embolism protocol.  Tomorrow work her into my office hours. ----- Message ----- From: Samson Frederic, RN Sent: 08/06/2022   8:35 AM EDT To: Baldo Daub, MD  Please address this patient's labs. ----- Message ----- From: Georgeanna Lea, MD Sent: 08/05/2022   4:54 PM EDT To: Samson Frederic, RN  So, this blood test has been done last week ordered by me but patient is due, especially never end up in my box.  Gross abnormality with proBNP as well as D-dimer.  Please show tomorrow to Dr. Kirt Boys and please schedule to follow-up with [place] very quickly

## 2022-08-06 NOTE — ED Notes (Signed)
Pt assisted to the restroom without incident. Ambulatory without assist, assist used for caution.

## 2022-08-06 NOTE — ED Triage Notes (Signed)
Arrived from OP imaging services, had CT angio with findings consistent with pulmonary edema. Patient endorsed difficulty breathing while doing the scan. In triage patient is upright in a WC and reports feeling much better. Hx significant with cardiomyopathy and HF. Husband at bedside reports EF 25-30%.

## 2022-08-06 NOTE — ED Notes (Signed)
D/c paperwork reviewed with pt, including follow up care.  No questions or concerns voiced at time of d/c. . Pt verbalized understanding, Ambulatory with family to ED exit, NAD.   

## 2022-08-06 NOTE — Discharge Instructions (Addendum)
Please continue to take your torsemide with next dose being tomorrow morning.  You do not have a pulmonary embolism but it does show that you are retaining more fluid which is likely causing some shortness of breath and pressure.  You received Lasix here today to assist with this.  However, you are stable for follow-up with Dr. Dulce Sellar tomorrow.

## 2022-08-06 NOTE — ED Notes (Signed)
Pt ambulatory to bathroom, standby assistance.  

## 2022-08-06 NOTE — Telephone Encounter (Signed)
Called this patient and informed her of Dr. Hulen Shouts recommendation below regarding her labs:  "This is the first time of seeing her labs I am very concerned about her elevated D-dimer I think today she should have a CTA done chest pulmonary embolism protocol.  Tomorrow work her into my office hours."  STAT chest CTA to rule out PE was ordered via Epic and confirmed with imaging department at [facility]. Patient has an appointment at 5 pm. Patient was informed of the time of her appointment and given instructions of how to get to the imaging department. An appointment was also scheduled for her to see Dr. Dulce Sellar in the office at 1:00 pm on 08/07/22 per his recommendation. Patient verbalized understanding of the instructions and had no further questions at this time.

## 2022-08-06 NOTE — Telephone Encounter (Signed)
Called this patient and informed her of Dr. Munley's recommendation below regarding her labs:  "This is the first time of seeing her labs I am very concerned about her elevated D-dimer I think today she should have a CTA done chest pulmonary embolism protocol.  Tomorrow work her into my office hours."  STAT chest CTA to rule out PE was ordered via Epic and confirmed with imaging department at [facility]. Patient has an appointment at 5 pm. Patient was informed of the time of her appointment and given instructions of how to get to the imaging department. An appointment was also scheduled for her to see Dr. Munley in the office at 1:00 pm on 08/07/22 per his recommendation. Patient verbalized understanding of the instructions and had no further questions at this time.  

## 2022-08-06 NOTE — ED Notes (Signed)
Pt has returned to their room 

## 2022-08-07 ENCOUNTER — Ambulatory Visit: Payer: Medicare HMO | Attending: Cardiology | Admitting: Cardiology

## 2022-08-07 ENCOUNTER — Other Ambulatory Visit: Payer: Self-pay

## 2022-08-07 ENCOUNTER — Telehealth: Payer: Self-pay | Admitting: Cardiology

## 2022-08-07 ENCOUNTER — Encounter: Payer: Self-pay | Admitting: Cardiology

## 2022-08-07 VITALS — BP 90/64 | HR 90 | Ht 59.0 in | Wt 127.0 lb

## 2022-08-07 DIAGNOSIS — I420 Dilated cardiomyopathy: Secondary | ICD-10-CM

## 2022-08-07 DIAGNOSIS — I110 Hypertensive heart disease with heart failure: Secondary | ICD-10-CM | POA: Diagnosis not present

## 2022-08-07 DIAGNOSIS — I5022 Chronic systolic (congestive) heart failure: Secondary | ICD-10-CM | POA: Diagnosis not present

## 2022-08-07 DIAGNOSIS — Z7409 Other reduced mobility: Secondary | ICD-10-CM | POA: Insufficient documentation

## 2022-08-07 HISTORY — DX: Other reduced mobility: Z74.09

## 2022-08-07 MED ORDER — TORSEMIDE 10 MG PO TABS: 10.0000 mg | ORAL_TABLET | Freq: Every day | ORAL | 3 refills | Status: DC

## 2022-08-07 NOTE — Telephone Encounter (Signed)
Pt c/o medication issue:  1. Name of Medication:   torsemide (DEMADEX) 10 MG tablet   2. How are you currently taking this medication (dosage and times per day)?   3. Are you having a reaction (difficulty breathing--STAT)?   4. What is your medication issue?   Caller states they need clarification on the instructions for this medication or send a new prescription.

## 2022-08-07 NOTE — Patient Instructions (Signed)
Medication Instructions:  Your physician has recommended you make the following change in your medication:   START: Torsemide 10 mg tablets twice daily (Take 2 Torsemide tablets = 20 mg in the am and 1 Torsemide tablet = 10 mg in the afternoon)  *If you need a refill on your cardiac medications before your next appointment, please call your pharmacy*   Lab Work: Your physician recommends that you return for lab work in:   Labs in 1 week: BMP, Pro BNP  If you have labs (blood work) drawn today and your tests are completely normal, you will receive your results only by: MyChart Message (if you have MyChart) OR A paper copy in the mail If you have any lab test that is abnormal or we need to change your treatment, we will call you to review the results.   Testing/Procedures: None   Follow-Up: At [facility], you and your health needs are our priority.  As part of our continuing mission to provide you with exceptional heart care, we have created designated Provider Care Teams.  These Care Teams include your primary Cardiologist (physician) and Advanced Practice Providers (APPs -  Physician Assistants and Nurse Practitioners) who all work together to provide you with the care you need, when you need it.  We recommend signing up for the patient portal called "MyChart".  Sign up information is provided on this After Visit Summary.  MyChart is used to connect with patients for Virtual Visits (Telemedicine).  Patients are able to view lab/test results, encounter notes, upcoming appointments, etc.  Non-urgent messages can be sent to your provider as well.   To learn more about what you can do with MyChart, go to [email or web address].    Your next appointment:   1 week(s)  Provider:   Norman Herrlich, MD    Other Instructions None

## 2022-08-07 NOTE — Progress Notes (Signed)
Cardiology Office Note:    Date:  08/07/2022   ID:  Nichole Johnson, DOB [birth year], MRN [id number]  PCP:  [name], MD  Cardiologist:  Norman Herrlich, MD    Referring MD: [name], MD    ASSESSMENT:    1. Chronic systolic congestive heart failure (HCC)   2. Dilated cardiomyopathy (HCC)   3. Hypertensive heart disease with heart failure (HCC)    PLAN:    In order of problems listed above:  Remains decompensated increase her loop diuretic reassess in 1 week recheck renal function and if tolerated transition lisinopril to valsartan as a transition to [employer] and start SGLT2 inhibitor I do not see value in rechecking her echocardiogram she has chronic severe cardiomyopathy   Next appointment: 1 week see me back in the office weeks I can also reengage her regarding ICD advanced heart failure   Medication Adjustments/Labs and Tests Ordered: Current medicines are reviewed at length with the patient today.  Concerns regarding medicines are outlined above.  No orders of the defined types were placed in this encounter.  Meds ordered this encounter  Medications   torsemide (DEMADEX) 10 MG tablet    Sig: Take 1 tablet (10 mg total) by mouth daily. Take 2 Torsemide tablets = 20 mg in the am and 1 Torsemide tablet = 10 mg in the afternoon    Dispense:  90 tablet    Refill:  3    No chief complaint on file.   History of Present Illness:    Nichole Johnson is a 77 y.o. 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  She had followed in our office last week with shortness of breath labs were ordered by one of my partners proBNP as well as D-dimer were quite elevated she had recent orthopedic surgery had a CTA of her chest late last evening that showed no pulmonary embolism with findings of heart failure.  We spoke to her on the phone she is quite short of breath  at rest I sent her over to the emergency room think that you require hospitalization but after giving her IV Lasix she is improved and felt to be appropriate for discharge.  Compliance with diet, lifestyle and medications: Yes  She has not done well since her last knee surgery for couple weeks and her weight is up in the range of 10 pounds more edema more shortness of breath. She really did not want to be admitted to the hospital was improved after dose of IV furosemide She still has orthopnea shortness of breath bending over no chest pain palpitation syncope Will increase her loop diuretic reassess in 1 week in the office and at that time I would favor withdrawing lisinopril and transition her to a low-dose valsartan 40 mg twice daily for 2 weeks and if tolerated to Entresto as well as SGLT2 inhibitor Past Medical History:  Diagnosis Date   Acute on chronic systolic congestive heart failure (HCC) 05/19/2016   Acute renal insufficiency 06/09/2016   AKI (acute kidney injury) (HCC) 11/03/2017   Anemia 04/18/2014   Benign hypertension 07/08/2016   Breast cancer (HCC) 2001   Left Breast Cancer   Cancer [facility])    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 Pt was given copy of recent ECHO report.  Last Assessment & Plan:  Pt will f/u w/ Cardiology re this. Pt was given copy of recent ECHO report.    Obstructive sleep apnea (adult) (pediatric) 07/08/2016   Personal history of radiation therapy 2001   Left Breast Cancer   Reactive airway  disease without complication 10/14/2017    Past Surgical History:  Procedure Laterality Date   BREAST LUMPECTOMY Left 2001   CESAREAN SECTION     ROTATOR CUFF REPAIR Bilateral    SHOULDER CLOSED REDUCTION      Current Medications: Current Meds  Medication Sig   albuterol (PROVENTIL HFA;VENTOLIN HFA) 108 (90 Base) MCG/ACT inhaler Inhale 2 puffs into the lungs every 6 (six) hours as needed for wheezing or shortness of breath.   alendronate (FOSAMAX) 5 MG tablet Take 5 mg by mouth once a week. Take with a full glass of water on an empty stomach.   aspirin EC (ASPIRIN 81) 81 MG tablet Take 1 tablet (81 mg total) by mouth every morning.   brinzolamide (AZOPT) 1 % ophthalmic suspension Place 1 drop into both eyes 2 (two) times daily.   Calcium Carbonate-Vitamin D (CALCIUM + D PO) Take 1 tablet by mouth 2 (two) times daily.    Cholecalciferol 25 MCG (1000 UT) tablet Take 1,000 Units by mouth daily.    ferrous sulfate 325 (65 FE) MG tablet Take 325 mg by mouth daily.   fluticasone (FLONASE) 50 MCG/ACT nasal spray Place 2 sprays into both nostrils daily.   levothyroxine (SYNTHROID, LEVOTHROID) 100 MCG tablet Take 100 mcg by mouth daily.   lisinopril (ZESTRIL) 2.5 MG tablet TAKE 1 TABLET EVERY DAY   Loratadine 10 MG CAPS Take 10 mg by mouth daily.   metoprolol succinate (TOPROL-XL) 50 MG 24 hr tablet Take 1 tablet (50 mg total) by mouth daily.   Multiple Vitamins-Minerals (CENTRUM SILVER PO) Take by mouth every morning.   psyllium (METAMUCIL) 58.6 % packet Take 1 packet by mouth daily as needed.    simvastatin (ZOCOR) 20 MG tablet simvastatin 20 mg tablet  TAKE 1 TABLET BY MOUTH ONCE DAILY AT NIGHT   torsemide (DEMADEX) 10 MG tablet Take 1 tablet (10 mg total) by mouth daily. Take 2 Torsemide tablets = 20 mg in the am and 1 Torsemide tablet = 10 mg in the afternoon   Travoprost, BAK Free, (TRAVATAN) 0.004 % SOLN ophthalmic solution Place 1 drop into both eyes at bedtime.   vitamin B-12  (CYANOCOBALAMIN) 1000 MCG tablet Take 1,000 mcg by mouth daily.   vitamin C (ASCORBIC ACID) 500 MG tablet Take 500 mg by mouth daily.   [DISCONTINUED] torsemide (DEMADEX) 10 MG tablet Take 1 tablet (10 mg total) by mouth 2 (two) times daily.     Allergies:   Penicillins, Sulfamethoxazole-trimethoprim, and Tramadol   Social History   Socioeconomic History   Marital status: Married    Spouse name: Not on file   Number of children: Not on file   Years of education: Not on file   Highest education level: Not on file  Occupational History   Not on file  Tobacco Use   Smoking status: Never  Passive exposure: Never   Smokeless tobacco: Never   Tobacco comments:    NEVER USED TOBACCO  Vaping Use   Vaping Use: Never used  Substance and Sexual Activity   Alcohol use: Yes    Alcohol/week: 0.0 standard drinks of alcohol    Comment: occ   Drug use: No   Sexual activity: Not on file  Other Topics Concern   Not on file  Social History Narrative   Not on file   Social Determinants of Health   Financial Resource Strain: Not on file  Food Insecurity: Not on file  Transportation Needs: Not on file  Physical Activity: Not on file  Stress: Not on file  Social Connections: Not on file     Family History: The patient's family history includes Hypertension in her mother. There is no history of Heart attack, Stroke, or Diabetes. ROS:   Please see the history of present illness.    All other systems reviewed and are negative.  EKGs/Labs/Other Studies Reviewed:    The following studies were reviewed today:  Cardiac Studies & Procedures       ECHOCARDIOGRAM  ECHOCARDIOGRAM COMPLETE 12/18/2020  Narrative ECHOCARDIOGRAM REPORT    Patient Name:   JAVONNE LOUISSAINT  Date of Exam: 12/18/2020 Medical Rec #:  [id number]     Height:       59.0 in Accession #:    [id number]    Weight:       133.0 lb Date of Birth:  12-Apr-1946     BSA:          1.551 m Patient Age:    75 years      BP:            115/74 mmHg Patient Gender: F             HR:           85 bpm. Exam Location:  [address]  Procedure: 2D Echo, Cardiac Doppler, Color Doppler and Intracardiac Opacification Agent  Indications:    I50.9* Heart failure (unspecified)  History:        Patient has no prior history of Echocardiogram examinations. CHF; Risk Factors:Hypertension and Dyslipidemia. Dilated cardiomyopathy. Hypotension. Obstructive sleep apnea. Hypothyroidism.  Sonographer:    Revonda Standard Referring Phys: [phone] Cesiah Westley J Dashanti Burr  IMPRESSIONS   1. The left ventricle is not dilated, but exhibits spherical remodeling. No thrombus is seen (with Definity). Left ventricular ejection fraction, by estimation, is 30 to 35%. The left ventricle has moderately decreased function. The left ventricle demonstrates global hypokinesis. Left ventricular diastolic parameters are consistent with Grade I diastolic dysfunction (impaired relaxation). 2. Right ventricular systolic function is normal. The right ventricular size is normal. There is normal pulmonary artery systolic pressure. The estimated right ventricular systolic pressure is 22.9 mmHg. 3. The mitral valve is normal in structure. Trivial mitral valve regurgitation. No evidence of mitral stenosis. 4. Tricuspid valve regurgitation is moderate. 5. The aortic valve is tricuspid. Aortic valve regurgitation is not visualized. No aortic stenosis is present.  FINDINGS Left Ventricle: The left ventricle is not dilated, but exhibits spherical remodeling. No thrombus is seen (with Definity). Left ventricular ejection fraction, by estimation, is 30 to 35%. The left ventricle has moderately decreased function. The left ventricle demonstrates global hypokinesis. The left ventricular internal cavity size was normal in size. There is no left ventricular hypertrophy. Left ventricular diastolic parameters are consistent with Grade I diastolic dysfunction (impaired relaxation). Normal  left ventricular filling pressure.  Right Ventricle: The right ventricular size is normal. No increase in right ventricular wall thickness. Right ventricular systolic function is normal. There is normal pulmonary artery systolic pressure. The tricuspid regurgitant velocity is 2.23 m/s, and with an assumed right atrial pressure of 3 mmHg, the estimated right ventricular systolic pressure is 22.9 mmHg.  Left Atrium: Left atrial size was normal in size.  Right Atrium: Right atrial size was normal in size.  Pericardium: There is no evidence of pericardial effusion.  Mitral Valve: The mitral valve is normal in structure. There is mild thickening of the mitral valve leaflet(s). Trivial mitral valve regurgitation. No evidence of mitral valve stenosis.  Tricuspid Valve: The tricuspid valve is normal in structure. Tricuspid valve regurgitation is moderate.  Aortic Valve: The aortic valve is tricuspid. Aortic valve regurgitation is not visualized. No aortic stenosis is present.  Pulmonic Valve: The pulmonic valve was normal in structure. Pulmonic valve regurgitation is not visualized.  Aorta: The aortic root and ascending aorta are structurally normal, with no evidence of dilitation.  IAS/Shunts: No atrial level shunt detected by color flow Doppler.   LEFT VENTRICLE PLAX 2D LVIDd:         5.10 cm      Diastology LVIDs:         4.10 cm      LV e' medial:    5.44 cm/s LV PW:         1.00 cm      LV E/e' medial:  7.6 LV IVS:        0.70 cm      LV e' lateral:   4.68 cm/s LVOT diam:     1.95 cm      LV E/e' lateral: 8.9 LV SV:         30 LV SV Index:   20 LVOT Area:     2.99 cm  LV Volumes (MOD) LV vol d, MOD A4C: 115.8 ml LV vol s, MOD A4C: 80.3 ml LV SV MOD A4C:     35.5 ml  RIGHT VENTRICLE RV Basal diam:  2.60 cm RV S prime:     14.60 cm/s TAPSE (M-mode): 1.9 cm RVSP:           22.9 mmHg  LEFT ATRIUM             Index       RIGHT ATRIUM           Index LA diam:        2.50 cm  1.61 cm/m  RA Pressure: 3.00 mmHg LA Vol (A2C):   29.1 ml 18.77 ml/m RA Area:     9.26 cm LA Vol (A4C):   39.1 ml 25.21 ml/m RA Volume:   15.70 ml  10.12 ml/m LA Biplane Vol: 34.6 ml 22.31 ml/m AORTIC VALVE LVOT Vmax:   62.10 cm/s LVOT Vmean:  37.900 cm/s LVOT VTI:    0.102 m  AORTA Ao Root diam: 2.90 cm Ao Asc diam:  2.90 cm  MITRAL VALVE               TRICUSPID VALVE MV Area (PHT): 5.46 cm    TR Peak grad:   19.9 mmHg MV Decel Time: 139 msec    TR Vmax:        223.00 cm/s MV E velocity: 41.40 cm/s  Estimated RAP:  3.00 mmHg MV A velocity: 91.20 cm/s  RVSP:           22.9 mmHg MV E/A  ratio:  0.45 SHUNTS Systemic VTI:  0.10 m Systemic Diam: 1.95 cm  Rachelle Hora Croitoru MD Electronically signed by Thurmon Fair MD Signature Date/Time: 12/18/2020/5:50:13 PM    Final      CARDIAC MRI  MR CARDIAC MORPHOLOGY W WO CONTRAST 07/17/2019  Narrative CLINICAL DATA:  Cardiomyopathy suspected  COMPARISON: None available  EXAM: CARDIAC MRI  TECHNIQUE: The patient was scanned on a 1.5 Tesla GE magnet. A dedicated cardiac coil was used. Functional imaging was done using Fiesta sequences. 2,3, and 4 chamber views were done to assess for RWMA's. Modified Simpson's rule using a short axis stack was used to calculate an ejection fraction on a dedicated work [employer]. The patient received 8mL GADAVIST GADOBUTROL 1 MMOL/ML IV SOLN. After 10 minutes inversion recovery sequences were used to assess for infiltration and scar tissue.  CONTRAST:  8mL GADAVIST GADOBUTROL 1 MMOL/ML IV SOLN  FINDINGS: LEFT VENTRICLE:  Normal left ventricular size, thickness. Severely reduced systolic function (LVEF = 20%). Severe global hypokinesis with mild dyssynchrony of the ventricular septum.  There is late gadolinium enhancement in the left ventricular myocardium in the mid to apical anterior wall, and appears transmural, suggesting prior infarct with no viability in  this region.  Normal T1 myocardial nulling kinetics suggests against cardiac amyloidosis.  No LV thrombus.  ECV = 27%  RIGHT VENTRICLE:  Normal right ventricular size, thickness. Mild-moderately reduced systolic function (RVEF = 34%). There are no regional wall motion abnormalities.  ATRIA:  Mild LA enlargement.  Normal RA size.  VALVES:  Not well visualized due to motion artifact.  PERICARDIUM:  Normal pericardium.  No pericardial effusion.  OTHER: Moderate size esophageal hiatal hernia incidentally noted. Recommend dedicated imaging to further assess. This finding appears to distort the LV base inferiorly.  MEASUREMENTS:  LVEDD: 47 mm  LVESD: 42 mm  LVEDV: 102 ml  LVESV: 82 ml  SV: 20 ml  CO: 1.6 L/min  Myocardial mass: 80 g  RVEDV: 45 ml  RVEDS: 30 ml  RVSV: 15 mL  IMPRESSION: 1. Normal left ventricular chamber size with severely reduced systolic function. LVEF 20%.  2. Normal right ventricular chamber size, mild-moderately reduced systolic function. RVEF 34%.  3. Transmural delayed myocardial enhancement mid-apical LV anterior wall, suggestive of prior infarct, with no viability in this distribution.  4.  No LV apical thrombus.  5. Moderate size hiatal hernia incidentally noted. Consider dedicated imaging to further assess.   Electronically Signed By: Weston Brass On: 07/17/2019 05:40           Recent Labs: 07/29/2022: NT-Pro BNP 5,708 08/06/2022: ALT 81; B Natriuretic Peptide 1,243.8; BUN 24; Creatinine, Ser 0.98; Hemoglobin 11.8; Platelets 386; Potassium 3.9; Sodium 133  Recent Lipid Panel    Component Value Date/Time   CHOL 168 02/25/2022 1144   TRIG 105 02/25/2022 1144   HDL 65 02/25/2022 1144   CHOLHDL 2.6 02/25/2022 1144   LDLCALC 84 02/25/2022 1144    Physical Exam:    VS:  BP 90/64 (BP Location: Right Arm, Patient Position: Sitting, Cuff Size: Normal)   Pulse 90   Ht 4\' 11"  (1.499 m)   Wt 127 lb (57.6 kg)    SpO2 98%   BMI 25.65 kg/m     Wt Readings from Last 3 Encounters:  08/07/22 127 lb (57.6 kg)  08/06/22 130 lb (59 kg)  07/06/22 128 lb 6.4 oz (58.2 kg)     GEN:  Well nourished, well developed in no acute distress  HEENT: Normal NECK: No JVD; No carotid bruits LYMPHATICS: No lymphadenopathy CARDIAC: RRR, no murmurs, rubs, gallops RESPIRATORY:  Clear to auscultation without rales, wheezing or rhonchi  ABDOMEN: Soft, non-tender, non-distended MUSCULOSKELETAL: Still 1-2+ edema edema; No deformity  SKIN: Warm and dry NEUROLOGIC:  Alert and oriented x 3 PSYCHIATRIC:  Normal affect    Signed, Norman Herrlich, MD  08/07/2022 2:09 PM    [facility]

## 2022-08-07 NOTE — Telephone Encounter (Signed)
Called Candace at the [facility] and clarified the instructions for the patient's Torsemide medication. Candace had no further questions regarding the patient's medication.

## 2022-08-10 ENCOUNTER — Other Ambulatory Visit: Payer: Self-pay

## 2022-08-12 ENCOUNTER — Telehealth: Payer: Self-pay | Admitting: Cardiology

## 2022-08-12 NOTE — Telephone Encounter (Signed)
Left message for the patient to call back.

## 2022-08-12 NOTE — Telephone Encounter (Signed)
Patient would like to speak to Dr. Dulce Sellar nurse.

## 2022-08-13 ENCOUNTER — Other Ambulatory Visit: Payer: Self-pay

## 2022-08-13 ENCOUNTER — Ambulatory Visit: Payer: Medicare HMO | Admitting: Cardiology

## 2022-08-13 DIAGNOSIS — I5022 Chronic systolic (congestive) heart failure: Secondary | ICD-10-CM

## 2022-08-13 NOTE — Telephone Encounter (Signed)
Called patient and she stated that she lives in [place] and is unable to drive up to the [place] office to see the Victorino Dike the nurse practitioner today. She also explained that she was going to have her labs drawn at the [facility] location but the labs were not entered in Epic. I re-entered her labs in Epic and released them. I informed the patient of this and she stated that she would call the office if she ran into any issues. Patient had no further questions at this time.

## 2022-08-15 LAB — BASIC METABOLIC PANEL
BUN/Creatinine Ratio: 17 (ref 12–28)
BUN: 18 mg/dL (ref 8–27)
CO2: 22 mmol/L (ref 20–29)
Calcium: 9.3 mg/dL (ref 8.7–10.3)
Chloride: 97 mmol/L (ref 96–106)
Creatinine, Ser: 1.09 mg/dL — ABNORMAL HIGH (ref 0.57–1.00)
Glucose: 110 mg/dL — ABNORMAL HIGH (ref 70–99)
Potassium: 4.1 mmol/L (ref 3.5–5.2)
Sodium: 138 mmol/L (ref 134–144)
eGFR: 52 mL/min/{1.73_m2} — ABNORMAL LOW (ref >59)

## 2022-08-15 LAB — PRO B NATRIURETIC PEPTIDE: NT-Pro BNP: 3672 pg/mL — ABNORMAL HIGH (ref 0–738)

## 2022-08-17 ENCOUNTER — Encounter: Payer: Self-pay | Admitting: Cardiology

## 2022-08-17 ENCOUNTER — Other Ambulatory Visit: Payer: Self-pay

## 2022-08-17 ENCOUNTER — Telehealth: Payer: Self-pay | Admitting: Cardiology

## 2022-08-17 DIAGNOSIS — I5022 Chronic systolic (congestive) heart failure: Secondary | ICD-10-CM

## 2022-08-17 MED ORDER — VALSARTAN 40 MG PO TABS: 40.0000 mg | ORAL_TABLET | Freq: Every day | ORAL | 3 refills | Status: DC

## 2022-08-17 NOTE — Telephone Encounter (Signed)
Spoke with pt. She had received her results and instructions.  She stated that she is feeling better but still occasionally has a heavy feeling. Called in Valsartan and d/c'd Lisinopril on med list.

## 2022-08-17 NOTE — Telephone Encounter (Signed)
Patient is returning CMA's call for her lab results. Please advise. 

## 2022-08-19 NOTE — Telephone Encounter (Signed)
-----   Message from Flossie Dibble, NP sent at 08/16/2022  1:35 PM EDT ----- Ms. Nichole Johnson, My name is Nichole Johnson, I am the NP that works closely with Dr. Dulce Sellar. I reviewed your recent lab work and we need to make some changes.  How are you feeling since you have been taking the fluid pill twice daily? Any better? Are you able to check your BP at home? If so, I would like you to check is once/daily ~ 2hours after you take your BP meds.   1. Stop your lisnopril 2. Start Valsartan 40 mg daily  Return in 1 week for repeat proBNP, BMET -- labs so we can see how your kidney function is and if you are getting rid of some of the fluid.  Best, Victorino Dike

## 2022-08-20 ENCOUNTER — Other Ambulatory Visit: Payer: Self-pay

## 2022-08-20 ENCOUNTER — Telehealth: Payer: Self-pay | Admitting: Cardiology

## 2022-08-20 DIAGNOSIS — E782 Mixed hyperlipidemia: Secondary | ICD-10-CM

## 2022-08-20 NOTE — Telephone Encounter (Signed)
  Pt made an appt with Wallis Bamberg on 05/31. She would like to request if she can order her ALT and liver enzyme lab work so she can get it done at [facility] office before her appt

## 2022-08-20 NOTE — Telephone Encounter (Signed)
Spoke with Wallis Bamberg, NP and she stated that it was ok to order liver function test for this patient. Liver function test blood draw was ordered via Epic to be done at [address] [facility]. and the patient was informed of the test. Patient had no further questions at this time.

## 2022-08-21 ENCOUNTER — Encounter: Payer: Self-pay | Admitting: Cardiology

## 2022-08-21 LAB — BASIC METABOLIC PANEL WITH GFR
BUN/Creatinine Ratio: 26 (ref 12–28)
BUN: 26 mg/dL (ref 8–27)
CO2: 24 mmol/L (ref 20–29)
Calcium: 9.4 mg/dL (ref 8.7–10.3)
Chloride: 100 mmol/L (ref 96–106)
Creatinine, Ser: 1.01 mg/dL — ABNORMAL HIGH (ref 0.57–1.00)
Glucose: 120 mg/dL — ABNORMAL HIGH (ref 70–99)
Potassium: 4.1 mmol/L (ref 3.5–5.2)
Sodium: 139 mmol/L (ref 134–144)
eGFR: 57 mL/min/1.73 — ABNORMAL LOW

## 2022-08-21 LAB — HEPATIC FUNCTION PANEL
ALT: 19 IU/L (ref 0–32)
AST: 17 IU/L (ref 0–40)
Albumin: 4.2 g/dL (ref 3.8–4.8)
Alkaline Phosphatase: 54 IU/L (ref 44–121)
Bilirubin Total: 0.4 mg/dL (ref 0.0–1.2)
Bilirubin, Direct: 0.13 mg/dL (ref 0.00–0.40)
Total Protein: 6.3 g/dL (ref 6.0–8.5)

## 2022-08-21 LAB — PRO B NATRIURETIC PEPTIDE: NT-Pro BNP: 4330 pg/mL — ABNORMAL HIGH (ref 0–738)

## 2022-08-21 NOTE — Telephone Encounter (Signed)
Patient is calling to follow up due to not hearing back regarding this.  Please advise.

## 2022-08-26 ENCOUNTER — Other Ambulatory Visit: Payer: Self-pay | Admitting: Cardiology

## 2022-09-05 ENCOUNTER — Encounter: Payer: Self-pay | Admitting: Cardiology

## 2022-09-08 ENCOUNTER — Encounter: Payer: Self-pay | Admitting: Cardiology

## 2022-09-08 ENCOUNTER — Ambulatory Visit: Payer: Medicare HMO | Attending: Cardiology | Admitting: Cardiology

## 2022-09-08 DIAGNOSIS — I110 Hypertensive heart disease with heart failure: Secondary | ICD-10-CM

## 2022-09-08 DIAGNOSIS — E782 Mixed hyperlipidemia: Secondary | ICD-10-CM

## 2022-09-08 DIAGNOSIS — I427 Cardiomyopathy due to drug and external agent: Secondary | ICD-10-CM | POA: Diagnosis not present

## 2022-09-08 DIAGNOSIS — R5383 Other fatigue: Secondary | ICD-10-CM | POA: Diagnosis not present

## 2022-09-08 DIAGNOSIS — T451X5A Adverse effect of antineoplastic and immunosuppressive drugs, initial encounter: Secondary | ICD-10-CM

## 2022-09-08 DIAGNOSIS — R0609 Other forms of dyspnea: Secondary | ICD-10-CM

## 2022-09-08 NOTE — Patient Instructions (Signed)
Medication Instructions:  Your physician recommends that you continue on your current medications as directed. Please refer to the Current Medication list given to you today.  *If you need a refill on your cardiac medications before your next appointment, please call your pharmacy*   Lab Work: 3rd Floor [address] ProBNP, CMP, TSH, B12, Vit D3- today If you have labs (blood work) drawn today and your tests are completely normal, you will receive your results only by: MyChart Message (if you have MyChart) OR A paper copy in the mail If you have any lab test that is abnormal or we need to change your treatment, we will call you to review the results.   Testing/Procedures: None Ordered   Follow-Up: At [facility], you and your health needs are our priority.  As part of our continuing mission to provide you with exceptional heart care, we have created designated Provider Care Teams.  These Care Teams include your primary Cardiologist (physician) and Advanced Practice Providers (APPs -  Physician Assistants and Nurse Practitioners) who all work together to provide you with the care you need, when you need it.  We recommend signing up for the patient portal called "MyChart".  Sign up information is provided on this After Visit Summary.  MyChart is used to connect with patients for Virtual Visits (Telemedicine).  Patients are able to view lab/test results, encounter notes, upcoming appointments, etc.  Non-urgent messages can be sent to your provider as well.   To learn more about what you can do with MyChart, go to [email or web address].    Your next appointment:   3 week(s)  The format for your next appointment:   In Person  Provider:   Gypsy Balsam, MD    Other Instructions NA

## 2022-09-08 NOTE — Progress Notes (Unsigned)
Cardiology Office Note:    Date:  09/08/2022   ID:  Nichole Johnson, DOB 11-20-1945, MRN [id number]  PCP:  [name], MD  Cardiologist:  Gypsy Balsam, MD    Referring MD: [name], MD   No chief complaint on file. Still weak and tired  History of Present Illness:    Nichole Johnson is a 77 y.o. female past medical history significant for cardiomyopathy secondary to chemotherapy with latest estimation of ejection fraction from March 2024 of 25%.  She did have left knee replacement surgery done in March since that time she is can have deteriorated.  There was issue with shortness of breath, recently her torsemide has been increased to 20 mg twice daily, she seems to be doing better but vividly complain of weakness fatigue and tiredness as well as low blood pressure.  She is in the medevac was scheduled to have trip to [place] and cruise however decided not to do that.  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   Dilated cardiomyopathy (HCC) 04/10/2018   Dyspepsia 01/18/2017   Dysplastic nevus 12/02/2016   Glaucoma 10/14/2017   Hiatal hernia 07/20/2019   High cholesterol    History of breast cancer 04/23/2020   Hyperlipidemia 04/18/2014   Hypertension    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   Hypotension 06/09/2016   Hypothyroidism 04/18/2014   Impaired functional mobility, balance, gait, and endurance 08/07/2022   Irritable bowel syndrome without diarrhea 12/10/2014   Mild diastolic dysfunction 04/18/2014   Overview:  Last Assessment & Plan:  Pt will f/u w/  Cardiology re this. Pt was given copy of recent ECHO report.  Last Assessment & Plan:  Pt will f/u w/ Cardiology re this. Pt was given copy of recent ECHO report.    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 Added automatically from request for surgery [id number] Formatting of this note might be different from the original. Added automatically from request for surgery [id number]   Osteopenia of hip 04/21/2020   Formatting of this note might be different from the original. DEXA 06/2017: Lumbar T score -1.4, right total femur -1.4. She restarted alendronate since 05/2019. Formatting of this note might be different from the original. DEXA 06/2017: Lumbar T score -1.4, right total femur -1.4. She restarted alendronate since 05/2019.   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 Start using Flonase daily. Stop using OTC Afrin Formatting of this note might be different from the original. Last Assessment & Plan: Start using Flonase daily. Stop using OTC Afrin Formatting of this note might be different from the original. Overview: Last Assessment & Plan: Start using Flonase daily. 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   alendronate (FOSAMAX) 5 MG tablet Take 5 mg by mouth once a week. Take with a full glass of water on an empty stomach.   aspirin EC (ASPIRIN 81) 81 MG tablet Take 1 tablet (81 mg total) by mouth every morning.   brinzolamide (AZOPT) 1 % ophthalmic suspension Place 1 drop into both eyes 2 (two) times daily.   Calcium Carbonate-Vitamin D (CALCIUM + D PO) Take 1 tablet by mouth 2 (two) times daily.    Cholecalciferol 25 MCG (1000 UT) tablet Take 1,000 Units by mouth daily.    ferrous sulfate 325 (65 FE) MG tablet Take 325 mg by mouth daily.   fluticasone (FLONASE) 50 MCG/ACT nasal spray Place 2 sprays into both nostrils daily.   levothyroxine (SYNTHROID, LEVOTHROID) 100 MCG tablet Take 100 mcg by mouth daily.   Loratadine 10 MG CAPS Take 10 mg by mouth daily.   metoprolol succinate (TOPROL-XL) 50 MG 24 hr tablet Take 1 tablet (50 mg total) by mouth daily.   Multiple Vitamins-Minerals (CENTRUM SILVER PO) Take 1 tablet by mouth every morning.   psyllium (METAMUCIL) 58.6 % packet Take 1 packet by mouth daily as needed (constipation).   simvastatin (ZOCOR) 20 MG tablet Take 20 mg by mouth daily.   torsemide (DEMADEX) 10 MG tablet Take 1 tablet (10 mg total) by mouth daily. Take 2 Torsemide tablets = 20 mg in the am and 1 Torsemide tablet = 10 mg in the afternoon   Travoprost, BAK Free, (TRAVATAN) 0.004 % SOLN ophthalmic solution Place 1 drop into both eyes at bedtime.   valsartan (DIOVAN) 40 MG tablet Take 1 tablet (40 mg total) by mouth daily.   vitamin B-12 (CYANOCOBALAMIN) 1000 MCG tablet Take 1,000 mcg by mouth daily.   vitamin C (ASCORBIC ACID) 500 MG tablet Take 500 mg by mouth daily.     Allergies:   Penicillins, Sulfamethoxazole-trimethoprim, and Tramadol   Social History   Socioeconomic History   Marital status: Married    Spouse name: Not on file   Number of children: Not on file   Years of education: Not on file   Highest  education level: Not on file  Occupational History   Not on file  Tobacco Use   Smoking status: Never    Passive exposure: Never   Smokeless tobacco: Never   Tobacco comments:    NEVER USED TOBACCO  Vaping Use   Vaping Use: Never used  Substance and Sexual Activity   Alcohol use: Yes    Alcohol/week: 0.0 standard drinks of alcohol    Comment: occ   Drug use: No   Sexual activity: Not on file  Other Topics Concern   Not on file  Social History Narrative   Not on file   Social Determinants of Health   Financial Resource Strain: Not on file  Food Insecurity: Not on file  Transportation Needs: Not on file  Physical Activity: Not on file  Stress: Not on file  Social Connections: Not on  file     Family History: The patient's family history includes Hypertension in her mother. There is no history of Heart attack, Stroke, or Diabetes. ROS:   Please see the history of present illness.    All 14 point review of systems negative except as described per history of present illness  EKGs/Labs/Other Studies Reviewed:      Recent Labs: 08/06/2022: B Natriuretic Peptide 1,243.8; Hemoglobin 11.8; Platelets 386 08/20/2022: ALT 19; BUN 26; Creatinine, Ser 1.01; NT-Pro BNP 4,330; Potassium 4.1; Sodium 139  Recent Lipid Panel    Component Value Date/Time   CHOL 168 02/25/2022 1144   TRIG 105 02/25/2022 1144   HDL 65 02/25/2022 1144   CHOLHDL 2.6 02/25/2022 1144   LDLCALC 84 02/25/2022 1144    Physical Exam:    VS:  There were no vitals taken for this visit.    Wt Readings from Last 3 Encounters:  08/07/22 127 lb (57.6 kg)  08/06/22 130 lb (59 kg)  07/06/22 128 lb 6.4 oz (58.2 kg)     GEN:  Well nourished, well developed in no acute distress HEENT: Normal NECK: No JVD; No carotid bruits LYMPHATICS: No lymphadenopathy CARDIAC: RRR, no murmurs, no rubs, no gallops RESPIRATORY:  Clear to auscultation without rales, wheezing or rhonchi  ABDOMEN: Soft, non-tender,  non-distended MUSCULOSKELETAL:  No edema; No deformity  SKIN: Warm and dry LOWER EXTREMITIES: no swelling NEUROLOGIC:  Alert and oriented x 3 PSYCHIATRIC:  Normal affect   ASSESSMENT:    1. 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 Hypertensive heart disease with heart failure (HCC)   3. Mixed hyperlipidemia    PLAN:    In order of problems listed above:  Cardiomyopathy.  We try gradually to put her on guideline directed medical therapy, recently lisinopril has been switched over to Start and she takes only 20 mg daily, also metoprolol succinate 50 mg daily as well as torsemide 20 mg twice daily.  Will ask her to have Chem-7 done today as well as proBNP.  Cannot figure how much room we have titrating medication also find out that she was told to drink plenty of fluids ask her to cut down her fluids hopefully by doing this will be able to cut down torsemide, and by doing this hopefully will give [place] room to put her on Entresto. Mixed dyslipidemia I did review her K PN I do see a LDL of 84 HDL 65 this is from November of last year. Multiple additional problem including thyroid issue I will check her TSH thyroid profile today, will check her vitamin B12 as well as D3 today plan to look at noncardiac reasons for her weakness and fatigue   Medication Adjustments/Labs and Tests Ordered: Current medicines are reviewed at length with the patient today.  Concerns regarding medicines are outlined above.  No orders of the defined types were placed in this encounter.  Medication changes: No orders of the defined types were placed in this encounter.   Signed, Georgeanna Lea, MD, [facility] 09/08/2022 12:06 PM    [facility]

## 2022-09-09 ENCOUNTER — Ambulatory Visit: Payer: Medicare HMO | Admitting: Cardiology

## 2022-09-09 ENCOUNTER — Telehealth: Payer: Self-pay

## 2022-09-09 LAB — COMPREHENSIVE METABOLIC PANEL
Albumin: 4.4 g/dL (ref 3.8–4.8)
Alkaline Phosphatase: 72 IU/L (ref 44–121)
CO2: 26 mmol/L (ref 20–29)
Globulin, Total: 2.4 g/dL (ref 1.5–4.5)
Glucose: 101 mg/dL — ABNORMAL HIGH (ref 70–99)
Potassium: 4.4 mmol/L (ref 3.5–5.2)
eGFR: 51 mL/min/{1.73_m2} — ABNORMAL LOW (ref >59)

## 2022-09-09 NOTE — Telephone Encounter (Signed)
-----   Message from Baldo Daub, MD sent at 09/09/2022  3:24 PM EDT ----- Normal or stable result  B12 and folate levels are still pending.

## 2022-09-09 NOTE — Telephone Encounter (Signed)
Patient notified through my chart.

## 2022-09-10 LAB — PRO B NATRIURETIC PEPTIDE: NT-Pro BNP: 5154 pg/mL — ABNORMAL HIGH (ref 0–738)

## 2022-09-10 LAB — B12 AND FOLATE PANEL
Folate: >20 ng/mL (ref >3.0)
Vitamin B-12: >2000 pg/mL — ABNORMAL HIGH (ref 232–1245)

## 2022-09-10 LAB — COMPREHENSIVE METABOLIC PANEL
ALT: 46 IU/L — ABNORMAL HIGH (ref 0–32)
AST: 23 IU/L (ref 0–40)
Albumin/Globulin Ratio: 1.8 (ref 1.2–2.2)
BUN/Creatinine Ratio: 20 (ref 12–28)
BUN: 22 mg/dL (ref 8–27)
Bilirubin Total: 0.8 mg/dL (ref 0.0–1.2)
Calcium: 9.7 mg/dL (ref 8.7–10.3)
Chloride: 100 mmol/L (ref 96–106)
Creatinine, Ser: 1.11 mg/dL — ABNORMAL HIGH (ref 0.57–1.00)
Sodium: 142 mmol/L (ref 134–144)
Total Protein: 6.8 g/dL (ref 6.0–8.5)

## 2022-09-10 LAB — VITAMIN D 25 HYDROXY (VIT D DEFICIENCY, FRACTURES): Vit D, 25-Hydroxy: 59.6 ng/mL (ref 30.0–100.0)

## 2022-09-10 LAB — TSH: TSH: 2.66 u[IU]/mL (ref 0.450–4.500)

## 2022-09-11 ENCOUNTER — Encounter: Payer: Self-pay | Admitting: Cardiology

## 2022-09-11 ENCOUNTER — Ambulatory Visit: Payer: Medicare HMO | Admitting: Cardiology

## 2022-09-11 NOTE — Telephone Encounter (Signed)
Pt called in to see if B12 and folate levels have come in yet.

## 2022-09-11 NOTE — Telephone Encounter (Signed)
Error

## 2022-09-11 NOTE — Telephone Encounter (Signed)
Spoke with spouse advised that her labs were normal.

## 2022-09-15 ENCOUNTER — Ambulatory Visit: Payer: Medicare HMO | Admitting: Cardiology

## 2022-09-29 ENCOUNTER — Encounter: Payer: Self-pay | Admitting: Cardiology

## 2022-09-29 ENCOUNTER — Ambulatory Visit: Payer: Medicare HMO | Attending: Cardiology | Admitting: Cardiology

## 2022-09-29 VITALS — BP 96/60 | HR 89 | Ht 59.0 in | Wt 126.0 lb

## 2022-09-29 DIAGNOSIS — I427 Cardiomyopathy due to drug and external agent: Secondary | ICD-10-CM

## 2022-09-29 DIAGNOSIS — I420 Dilated cardiomyopathy: Secondary | ICD-10-CM | POA: Diagnosis not present

## 2022-09-29 DIAGNOSIS — I110 Hypertensive heart disease with heart failure: Secondary | ICD-10-CM | POA: Diagnosis not present

## 2022-09-29 DIAGNOSIS — T451X5D Adverse effect of antineoplastic and immunosuppressive drugs, subsequent encounter: Secondary | ICD-10-CM

## 2022-09-29 DIAGNOSIS — E7800 Pure hypercholesterolemia, unspecified: Secondary | ICD-10-CM | POA: Diagnosis not present

## 2022-09-29 MED ORDER — TORSEMIDE 20 MG PO TABS: 20.0000 mg | ORAL_TABLET | Freq: Two times a day (BID) | ORAL | 3 refills | Status: DC

## 2022-09-29 NOTE — Progress Notes (Unsigned)
Cardiology Office Note:    Date:  09/29/2022   ID:  Nichole Johnson, DOB 05-13-1945, MRN [id number]  PCP:  [name], MD  Cardiologist:  Gypsy Balsam, MD    Referring MD: [name], MD   Chief Complaint  Patient presents with   Follow-up  Doing better  History of Present Illness:    Nichole Johnson is a 77 y.o. female with past medical history significant for cardiomyopathy, initial estimation showed ejection fraction of 20 to 25% felt to be related to chemotherapy.  Initial diagnosis in 2021.  She was put on guideline directed medical therapy was doing quite well however recently she ended up having knee replacement surgery done after surgery she had some episode of congestion required some extra diuretic to clear it.  Comes today to my office for follow-up she is doing much better she said she have a little more energy swelling is gone.  The issue is low blood pressure that prevent me from putting her on guideline directed medical therapy.  Denies have any chest pain tightness squeezing pressure burning chest.  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   Dilated cardiomyopathy (HCC) 04/10/2018   Dyspepsia 01/18/2017   Dysplastic nevus 12/02/2016   Glaucoma 10/14/2017   Hiatal hernia 07/20/2019   High cholesterol    History of breast cancer 04/23/2020   Hyperlipidemia 04/18/2014   Hypertension    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   Hypotension 06/09/2016   Hypothyroidism 04/18/2014   Impaired functional mobility, balance, gait, and endurance 08/07/2022    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 Pt was given copy of recent ECHO report.  Last Assessment & Plan:  Pt will f/u w/ Cardiology re this. Pt was given copy of recent ECHO report.    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 Added automatically from request for surgery [id number] Formatting of this note might be different from the original. Added automatically from request for surgery [id number]   Osteopenia of hip 04/21/2020   Formatting of this note might be different from the original. DEXA 06/2017: Lumbar T score -1.4, right total femur -1.4. She restarted alendronate since 05/2019. Formatting of this note might be different from the original. DEXA 06/2017: Lumbar T score -1.4, right total femur -1.4. She restarted alendronate since 05/2019.   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 Start using Flonase daily. Stop using OTC Afrin Formatting of this note might be different from the original. Last Assessment & Plan: Start using Flonase daily. Stop using OTC Afrin Formatting of this note might be  different from the original. Overview: Last Assessment & Plan: Start using Flonase daily. Stop usin   Stage 3a chronic kidney disease (HCC) 04/01/2021   Trigger finger of left hand 07/26/2018    Past Surgical History:  Procedure Laterality Date   BREAST LUMPECTOMY Left 2001   CESAREAN SECTION     ROTATOR CUFF REPAIR Bilateral    SHOULDER CLOSED REDUCTION      Current  Medications: Current Meds  Medication Sig   albuterol (PROVENTIL HFA;VENTOLIN HFA) 108 (90 Base) MCG/ACT inhaler Inhale 2 puffs into the lungs every 6 (six) hours as needed for wheezing or shortness of breath.   alendronate (FOSAMAX) 5 MG tablet Take 5 mg by mouth once a week. Take with a full glass of water on an empty stomach.   aspirin EC (ASPIRIN 81) 81 MG tablet Take 1 tablet (81 mg total) by mouth every morning.   brinzolamide (AZOPT) 1 % ophthalmic suspension Place 1 drop into both eyes 2 (two) times daily.   Calcium Carbonate-Vitamin D (CALCIUM + D PO) Take 1 tablet by mouth 2 (two) times daily.    Cholecalciferol 25 MCG (1000 UT) tablet Take 1,000 Units by mouth daily.    ferrous sulfate 325 (65 FE) MG tablet Take 325 mg by mouth daily.   fluticasone (FLONASE) 50 MCG/ACT nasal spray Place 2 sprays into both nostrils daily.   levothyroxine (SYNTHROID, LEVOTHROID) 100 MCG tablet Take 100 mcg by mouth daily.   Loratadine 10 MG CAPS Take 10 mg by mouth daily.   metoprolol succinate (TOPROL-XL) 50 MG 24 hr tablet Take 1 tablet (50 mg total) by mouth daily.   Multiple Vitamins-Minerals (CENTRUM SILVER PO) Take 1 tablet by mouth every morning.   psyllium (METAMUCIL) 58.6 % packet Take 1 packet by mouth daily as needed (constipation).   simvastatin (ZOCOR) 20 MG tablet Take 20 mg by mouth daily.   torsemide (DEMADEX) 10 MG tablet Take 1 tablet (10 mg total) by mouth daily. Take 2 Torsemide tablets = 20 mg in the am and 1 Torsemide tablet = 10 mg in the afternoon (Patient taking differently: Take 20 mg by mouth 2 (two) times daily.)   Travoprost, BAK Free, (TRAVATAN) 0.004 % SOLN ophthalmic solution Place 1 drop into both eyes at bedtime.   valsartan (DIOVAN) 40 MG tablet Take 1 tablet (40 mg total) by mouth daily.   vitamin B-12 (CYANOCOBALAMIN) 1000 MCG tablet Take 1,000 mcg by mouth daily.   vitamin C (ASCORBIC ACID) 500 MG tablet Take 500 mg by mouth daily.     Allergies:   Penicillins,  Sulfamethoxazole-trimethoprim, and Tramadol   Social History   Socioeconomic History   Marital status: Married    Spouse name: Not on file   Number of children: Not on file   Years of education: Not on file   Highest education level: Not on file  Occupational History   Not on file  Tobacco Use   Smoking status: Never    Passive exposure: Never   Smokeless tobacco: Never   Tobacco comments:    NEVER USED TOBACCO  Vaping Use   Vaping Use: Never used  Substance and Sexual Activity   Alcohol use: Yes    Alcohol/week: 0.0 standard drinks of alcohol    Comment: occ   Drug use: No   Sexual activity: Not on file  Other Topics Concern   Not on file  Social History Narrative   Not on file   Social Determinants of Health  Financial Resource Strain: Not on file  Food Insecurity: Not on file  Transportation Needs: Not on file  Physical Activity: Not on file  Stress: Not on file  Social Connections: Not on file     Family History: The patient's family history includes Hypertension in her mother. There is no history of Heart attack, Stroke, or Diabetes. ROS:   Please see the history of present illness.    All 14 point review of systems negative except as described per history of present illness  EKGs/Labs/Other Studies Reviewed:      Recent Labs: 08/06/2022: B Natriuretic Peptide 1,243.8; Hemoglobin 11.8; Platelets 386 09/08/2022: ALT 46; BUN 22; Creatinine, Ser 1.11; NT-Pro BNP 5,154; Potassium 4.4; Sodium 142; TSH 2.660  Recent Lipid Panel    Component Value Date/Time   CHOL 168 02/25/2022 1144   TRIG 105 02/25/2022 1144   HDL 65 02/25/2022 1144   CHOLHDL 2.6 02/25/2022 1144   LDLCALC 84 02/25/2022 1144    Physical Exam:    VS:  BP 96/60 (BP Location: Left Arm, Patient Position: Sitting)   Pulse 89   Ht 4\' 11"  (1.499 m)   Wt 126 lb (57.2 kg)   SpO2 92%   BMI 25.45 kg/m     Wt Readings from Last 3 Encounters:  09/29/22 126 lb (57.2 kg)  08/07/22 127 lb  (57.6 kg)  08/06/22 130 lb (59 kg)     GEN:  Well nourished, well developed in no acute distress HEENT: Normal NECK: No JVD; No carotid bruits LYMPHATICS: No lymphadenopathy CARDIAC: RRR, no murmurs, no rubs, no gallops RESPIRATORY:  Clear to auscultation without rales, wheezing or rhonchi  ABDOMEN: Soft, non-tender, non-distended MUSCULOSKELETAL:  No edema; No deformity  SKIN: Warm and dry LOWER EXTREMITIES: no swelling NEUROLOGIC:  Alert and oriented x 3 PSYCHIATRIC:  Normal affect   ASSESSMENT:    1. Dilated cardiomyopathy (HCC)   2. 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 Hypertensive heart disease with heart failure (HCC)   4. High cholesterol    PLAN:    In order of problems listed above:  Dilated cardiomyopathy.  Sadly she is only able to tolerate small dose of beta-blocker 50 mg of metoprolol as well as small dose of ARB she takes valsartan only 40 mg daily she still takes Demadex 10 mg twice daily which is 20 mg daily.  Blood pressure today 96/60 she reports when she was getting to our office she got up she got dizzy.  Will plan to repeat her echocardiogram to recheck left ventricle ejection fraction then she will refer to advanced congestive heart failure clinic. Dyslipidemia I did review K PN which show me LDL of 84 HDL 65.  Continue monitoring.  In the future she may require ischemia workup as well in the meantime we will continue with Zocor 20.   Medication Adjustments/Labs and Tests Ordered: Current medicines are reviewed at length with the patient today.  Concerns regarding medicines are outlined above.  No orders of the defined types were placed in this encounter.  Medication changes: No orders of the defined types were placed in this encounter.   Signed, Georgeanna Lea, MD, [facility] 09/29/2022 10:45 AM    [facility]

## 2022-09-29 NOTE — Patient Instructions (Signed)
Medication Instructions:  Your physician recommends that you continue on your current medications as directed. Please refer to the Current Medication list given to you today.  *If you need a refill on your cardiac medications before your next appointment, please call your pharmacy*   Lab Work: None Ordered If you have labs (blood work) drawn today and your tests are completely normal, you will receive your results only by: MyChart Message (if you have MyChart) OR A paper copy in the mail If you have any lab test that is abnormal or we need to change your treatment, we will call you to review the results.   Testing/Procedures: Your physician has requested that you have an echocardiogram. Echocardiography is a painless test that uses sound waves to create images of your heart. It provides your doctor with information about the size and shape of your heart and how well your heart's chambers and valves are working. This procedure takes approximately one hour. There are no restrictions for this procedure. Please do NOT wear cologne, perfume, aftershave, or lotions (deodorant is allowed). Please arrive 15 minutes prior to your appointment time. d   Follow-Up: At [facility], you and your health needs are our priority.  As part of our continuing mission to provide you with exceptional heart care, we have created designated Provider Care Teams.  These Care Teams include your primary Cardiologist (physician) and Advanced Practice Providers (APPs -  Physician Assistants and Nurse Practitioners) who all work together to provide you with the care you need, when you need it.  We recommend signing up for the patient portal called "MyChart".  Sign up information is provided on this After Visit Summary.  MyChart is used to connect with patients for Virtual Visits (Telemedicine).  Patients are able to view lab/test results, encounter notes, upcoming appointments, etc.  Non-urgent messages can be sent to  your provider as well.   To learn more about what you can do with MyChart, go to [email or web address].    Your next appointment:   2 month(s)  The format for your next appointment:   In Person  Provider:   Gypsy Balsam, MD    Other Instructions NA

## 2022-10-05 ENCOUNTER — Telehealth: Payer: Self-pay | Admitting: Cardiology

## 2022-10-05 NOTE — Telephone Encounter (Signed)
New Message:      Patient says she has Bronchitis. She have been to the doctor, Her question is, should she come in and see Dr Garnett Farm?

## 2022-10-05 NOTE — Telephone Encounter (Signed)
Called the patient and informed her we did not need to see her for her bronchitis as she has been to the urgent care and seen a physician assistant.  She thanked me for the call and had no additional questions.

## 2022-10-21 ENCOUNTER — Ambulatory Visit (HOSPITAL_BASED_OUTPATIENT_CLINIC_OR_DEPARTMENT_OTHER)
Admission: RE | Admit: 2022-10-21 | Discharge: 2022-10-21 | Disposition: A | Discharge status: Home / Self Care | Payer: Medicare HMO | Source: Ambulatory Visit | Attending: Cardiology | Admitting: Cardiology

## 2022-10-21 DIAGNOSIS — I420 Dilated cardiomyopathy: Secondary | ICD-10-CM | POA: Insufficient documentation

## 2022-10-21 LAB — ECHOCARDIOGRAM COMPLETE
AR max vel: 1.15 cm2
AV Area VTI: 1.19 cm2
AV Area mean vel: 1.07 cm2
AV Mean grad: 3 mmHg
AV Peak grad: 4.5 mmHg
Ao pk vel: 1.06 m/s
Area-P 1/2: 6.71 cm2
MV M vel: 4.29 m/s
MV Peak grad: 73.6 mmHg
Radius: 0.4 cm
S' Lateral: 4.9 cm

## 2022-10-21 MED ORDER — PERFLUTREN LIPID MICROSPHERE
1.0000 mL | INTRAVENOUS | Status: AC | PRN
  Administered 2022-10-21: 6 mL via INTRAVENOUS

## 2022-10-25 ENCOUNTER — Other Ambulatory Visit: Payer: Self-pay | Admitting: Cardiology

## 2022-10-26 ENCOUNTER — Telehealth: Payer: Self-pay

## 2022-10-26 NOTE — Telephone Encounter (Signed)
Left message on My Chart with normal results per Dr. Krasowski's note. Routed to PCP. 

## 2022-12-22 ENCOUNTER — Encounter: Payer: Self-pay | Admitting: Cardiology

## 2022-12-22 ENCOUNTER — Ambulatory Visit: Payer: Medicare HMO | Admitting: Cardiology

## 2022-12-28 ENCOUNTER — Telehealth: Payer: Self-pay | Admitting: Cardiology

## 2022-12-28 NOTE — Telephone Encounter (Signed)
Pt called to say she was home from vacation and was feeling better. She stated that she continues to have some congestion and shortness of breath, she denied any swelling in her feet or legs. Encouraged cough and deep breathing and moving around. Also encouraged to follow up with PCP and Cardiology. Sen to front desk for appt. Pt verbalized understanding and had no further questions.

## 2022-12-28 NOTE — Telephone Encounter (Signed)
Patient is returning call in regards to results sent and message from MyChart. Please advise.

## 2023-01-06 ENCOUNTER — Telehealth: Payer: Self-pay | Admitting: Pulmonary Disease

## 2023-01-06 NOTE — Telephone Encounter (Signed)
Please arrange for new patient appointment with MD/APP in 1 to 2 weeks. Previous patient of PM, last seen 2017. - had COVID early September and treated, complains of persistent chest congestion

## 2023-01-08 ENCOUNTER — Ambulatory Visit: Payer: Medicare HMO | Attending: Cardiology | Admitting: Cardiology

## 2023-01-08 ENCOUNTER — Encounter: Payer: Self-pay | Admitting: Cardiology

## 2023-01-08 VITALS — BP 96/74 | HR 54 | Ht 59.0 in | Wt 122.6 lb

## 2023-01-08 DIAGNOSIS — I110 Hypertensive heart disease with heart failure: Secondary | ICD-10-CM

## 2023-01-08 DIAGNOSIS — I420 Dilated cardiomyopathy: Secondary | ICD-10-CM | POA: Diagnosis not present

## 2023-01-08 DIAGNOSIS — G4733 Obstructive sleep apnea (adult) (pediatric): Secondary | ICD-10-CM | POA: Diagnosis not present

## 2023-01-08 DIAGNOSIS — I10 Essential (primary) hypertension: Secondary | ICD-10-CM

## 2023-01-08 DIAGNOSIS — N1831 Chronic kidney disease, stage 3a: Secondary | ICD-10-CM

## 2023-01-08 MED ORDER — TORSEMIDE 20 MG PO TABS: 20.0000 mg | ORAL_TABLET | Freq: Once | ORAL | 3 refills | Status: DC

## 2023-01-08 NOTE — Progress Notes (Signed)
Cardiology Office Note:    Date:  01/08/2023   ID:  Vaishnavi Kindig, DOB 11-22-1945, MRN [id number]  PCP:  [name], MD  Cardiologist:  Gypsy Balsam, MD    Referring MD: [name], MD   Chief Complaint  Patient presents with   Covid follow up   Pneumonia    History of Present Illness:    Nichole Johnson is a 77 y.o. female with past medical history significant for cardiomyopathy initially diagnosed in 2021 with ejection fraction 20 to 25% felt to be related to chemotherapy she was put on medication after that improved significantly however recently she ended up having some knee surgery done after death around 2 problems became congested required some night Lasix and after that in June she had seen me I did echocardiogram which showed ejection fraction 30 to 35%.  After that she decided to travel to [place] while she was in graciously in the being sick with COVID and pneumonia requiring hospitalization 5 days apparently troponin at that hospital was minimally elevated.  She comes today to talk about this.  She is gradually recovering she is feeling better shortness of breath improved significantly there is no swelling of lower extremities overall she is happy the way she feels.  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   Dilated cardiomyopathy (HCC) 04/10/2018   Dyspepsia 01/18/2017   Dysplastic nevus 12/02/2016   Glaucoma 10/14/2017   Hiatal hernia 07/20/2019   High cholesterol    History of breast cancer 04/23/2020   Hyperlipidemia 04/18/2014   Hypertension    Hypertensive heart  disease with heart failure (HCC) 04/10/2018   Hypotension 06/09/2016   Hypothyroidism 04/18/2014   Impaired functional mobility, balance, gait, and endurance 08/07/2022   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 Pt was given copy of recent ECHO report.  Last Assessment & Plan:  Pt will f/u w/ Cardiology re this. Pt was given copy of recent ECHO report.    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 Added automatically from request for surgery [id number] Formatting of this note might be different from the original. Added automatically from request for surgery [id number]   Osteopenia of hip 04/21/2020   Formatting of this note might be different from the original. DEXA 06/2017: Lumbar T score -1.4, right total femur -1.4. She restarted alendronate since 05/2019. Formatting of this note might be different from the original. DEXA 06/2017: Lumbar T score -1.4, right total femur -1.4. She restarted alendronate since 05/2019.   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 Start using Flonase daily. Stop using OTC  Afrin Formatting of this note might be different from the original. Last Assessment & Plan: Start using Flonase daily. Stop using OTC Afrin Formatting of this note might be different from the original. Overview: Last Assessment & Plan: Start using Flonase daily. Stop usin   Stage 3a chronic kidney disease (HCC) 04/01/2021   Trigger finger of left hand 07/26/2018    Past Surgical  History:  Procedure Laterality Date   BREAST LUMPECTOMY Left 2001   CESAREAN SECTION     ROTATOR CUFF REPAIR Bilateral    SHOULDER CLOSED REDUCTION      Current Medications: Current Meds  Medication Sig   albuterol (PROVENTIL HFA;VENTOLIN HFA) 108 (90 Base) MCG/ACT inhaler Inhale 2 puffs into the lungs every 6 (six) hours as needed for wheezing or shortness of breath.   alendronate (FOSAMAX) 5 MG tablet Take 5 mg by mouth once a week. Take with a full glass of water on an empty stomach.   aspirin EC (ASPIRIN 81) 81 MG tablet Take 1 tablet (81 mg total) by mouth every morning.   brinzolamide (AZOPT) 1 % ophthalmic suspension Place 1 drop into both eyes 2 (two) times daily.   Calcium Carbonate-Vitamin D (CALCIUM + D PO) Take 1 tablet by mouth 2 (two) times daily.    Cholecalciferol 25 MCG (1000 UT) tablet Take 1,000 Units by mouth daily.    ferrous sulfate 325 (65 FE) MG tablet Take 325 mg by mouth daily.   fluticasone (FLONASE) 50 MCG/ACT nasal spray Place 2 sprays into both nostrils daily.   levothyroxine (SYNTHROID, LEVOTHROID) 100 MCG tablet Take 100 mcg by mouth daily.   Loratadine 10 MG CAPS Take 10 mg by mouth daily.   metoprolol succinate (TOPROL-XL) 50 MG 24 hr tablet Take 1 tablet (50 mg total) by mouth daily.   Multiple Vitamins-Minerals (CENTRUM SILVER PO) Take 1 tablet by mouth every morning.   psyllium (METAMUCIL) 58.6 % packet Take 1 packet by mouth daily as needed (constipation).   simvastatin (ZOCOR) 20 MG tablet Take 20 mg by mouth daily.   Travoprost, BAK Free, (TRAVATAN) 0.004 % SOLN ophthalmic solution Place 1 drop into both eyes at bedtime.   valsartan (DIOVAN) 40 MG tablet Take 1 tablet (40 mg total) by mouth daily.   vitamin B-12 (CYANOCOBALAMIN) 1000 MCG tablet Take 1,000 mcg by mouth daily.   vitamin C (ASCORBIC ACID) 500 MG tablet Take 500 mg by mouth daily.     Allergies:   Penicillins, Sulfamethoxazole-trimethoprim, and Tramadol   Social History    Socioeconomic History   Marital status: Married    Spouse name: Not on file   Number of children: Not on file   Years of education: Not on file   Highest education level: Not on file  Occupational History   Not on file  Tobacco Use   Smoking status: Never    Passive exposure: Never   Smokeless tobacco: Never   Tobacco comments:    NEVER USED TOBACCO  Vaping Use   Vaping status: Never Used  Substance and Sexual Activity   Alcohol use: Yes    Alcohol/week: 0.0 standard drinks of alcohol    Comment: occ   Drug use: No   Sexual activity: Not on file  Other Topics Concern   Not on file  Social History Narrative   Not on file   Social Determinants of Health   Financial Resource Strain: Not on file  Food Insecurity: Low Risk  (01/05/2023)   Received from [facility]  Hunger Vital Sign    Worried About Running Out of Food in the Last Year: Never true    Ran Out of Food in the Last Year: Never true  Transportation Needs: No Transportation Needs (01/05/2023)   Received from [facility]    In the past 12 months, has lack of reliable transportation kept you from medical appointments, meetings, work or from getting things needed for daily living? : No  Physical Activity: Not on file  Stress: Not on file  Social Connections: Unknown (06/27/2022)   Received from [facility]   Social Network    Social Network: Not on file     Family History: The patient's family history includes Hypertension in her mother. There is no history of Heart attack, Stroke, or Diabetes. ROS:   Please see the history of present illness.    All 14 point review of systems negative except as described per history of present illness  EKGs/Labs/Other Studies Reviewed:         Recent Labs: 08/06/2022: B Natriuretic Peptide 1,243.8; Hemoglobin 11.8; Platelets 386 09/08/2022: ALT 46; BUN 22; Creatinine, Ser 1.11; NT-Pro BNP 5,154; Potassium 4.4; Sodium 142; TSH 2.660   Recent Lipid Panel    Component Value Date/Time   CHOL 168 02/25/2022 1144   TRIG 105 02/25/2022 1144   HDL 65 02/25/2022 1144   CHOLHDL 2.6 02/25/2022 1144   LDLCALC 84 02/25/2022 1144    Physical Exam:    VS:  BP 96/74 (BP Location: Left Arm, Patient Position: Sitting)   Pulse (!) 54   Ht 4\' 11"  (1.499 m)   Wt 122 lb 9.6 oz (55.6 kg)   SpO2 92%   BMI 24.76 kg/m     Wt Readings from Last 3 Encounters:  01/08/23 122 lb 9.6 oz (55.6 kg)  09/29/22 126 lb (57.2 kg)  08/07/22 127 lb (57.6 kg)     GEN:  Well nourished, well developed in no acute distress HEENT: Normal NECK: No JVD; No carotid bruits LYMPHATICS: No lymphadenopathy CARDIAC: RRR, no murmurs, no rubs, no gallops RESPIRATORY:  Clear to auscultation without rales, wheezing or rhonchi  ABDOMEN: Soft, non-tender, non-distended MUSCULOSKELETAL:  No edema; No deformity  SKIN: Warm and dry LOWER EXTREMITIES: no swelling NEUROLOGIC:  Alert and oriented x 3 PSYCHIATRIC:  Normal affect   ASSESSMENT:    1. Dilated cardiomyopathy (HCC)   2. Hypertensive heart disease with heart failure (HCC)   3. Benign hypertension   4. Obstructive sleep apnea (adult) (pediatric)   5. Stage 3a chronic kidney disease (HCC)    PLAN:    In order of problems listed above:  Dilated cardiomyopathy latest ejection fraction 30 to 35%.  There is not much room to push more medications at this stage.  I will ask her to cut down her diuretic to only torsemide 20 mg daily and hopefully they will give me some room to go up with guideline directed medical therapy for her cardiomyopathy.  In the meantime she will be scheduled to see our advanced congestive heart for clinic.  She will most likely require ischemia workup especially in view of troponin it was elevated however it could have been elevated just because of her being very sick. Hypertensive heart disease now we have a pleasant problem blood pressure being low. History of obstructive sleep  apnea that being followed by antimedicine team. Chronic kidney failure I did review laboratory test I do have her creatinine from January which is 1.5.  She is scheduled to have her kidney function rechecked within 2 weeks we will wait for the test.   Medication Adjustments/Labs and Tests Ordered: Current medicines are reviewed at length with the patient today.  Concerns regarding medicines are outlined above.  Orders Placed This Encounter  Procedures   Ambulatory referral to Cardiology   Medication changes:  Meds ordered this encounter  Medications   torsemide (DEMADEX) 20 MG tablet    Sig: Take 1 tablet (20 mg total) by mouth once for 1 dose.    Dispense:  90 tablet    Refill:  3    Signed, Georgeanna Lea, MD, [facility] 01/08/2023 4:34 PM    [facility]

## 2023-01-08 NOTE — Patient Instructions (Addendum)
Medication Instructions:   DECREASE: Torsemide to 1 tablet daily   Lab Work: None Ordered If you have labs (blood work) drawn today and your tests are completely normal, you will receive your results only by: MyChart Message (if you have MyChart) OR A paper copy in the mail If you have any lab test that is abnormal or we need to change your treatment, we will call you to review the results.   Testing/Procedures: None Ordered   Follow-Up: At [facility], you and your health needs are our priority.  As part of our continuing mission to provide you with exceptional heart care, we have created designated Provider Care Teams.  These Care Teams include your primary Cardiologist (physician) and Advanced Practice Providers (APPs -  Physician Assistants and Nurse Practitioners) who all work together to provide you with the care you need, when you need it.  We recommend signing up for the patient portal called "MyChart".  Sign up information is provided on this After Visit Summary.  MyChart is used to connect with patients for Virtual Visits (Telemedicine).  Patients are able to view lab/test results, encounter notes, upcoming appointments, etc.  Non-urgent messages can be sent to your provider as well.   To learn more about what you can do with MyChart, go to [email or web address].    Your next appointment:   3 month(s)  The format for your next appointment:   In Person  Provider:   Gypsy Balsam, MD    Other Instructions Referral to CHF clinic

## 2023-01-11 ENCOUNTER — Telehealth: Payer: Self-pay | Admitting: Cardiology

## 2023-01-11 ENCOUNTER — Other Ambulatory Visit: Payer: Self-pay

## 2023-01-11 MED ORDER — TORSEMIDE 20 MG PO TABS: 20.0000 mg | ORAL_TABLET | Freq: Every day | ORAL | 3 refills | Status: DC

## 2023-01-11 NOTE — Telephone Encounter (Signed)
Follow up:     They need to clarify the directions for Torsemide. Is it 1 dose or s it  for 90 days. Patient is there waiting.

## 2023-01-11 NOTE — Telephone Encounter (Signed)
Pharmacy called. Corrected directions on Torsemide per Dr. Vanetta Shawl note.Torsemide 20mg  1 tablet daily #90 ref x 3. Called to [employer] [facility]

## 2023-01-13 ENCOUNTER — Telehealth: Payer: Self-pay | Admitting: Cardiology

## 2023-01-13 DIAGNOSIS — I110 Hypertensive heart disease with heart failure: Secondary | ICD-10-CM

## 2023-01-13 MED ORDER — TORSEMIDE 20 MG PO TABS
20.0000 mg | ORAL_TABLET | Freq: Two times a day (BID) | ORAL | Status: DC
Start: 2023-01-13 — End: 2023-02-09

## 2023-01-13 NOTE — Telephone Encounter (Signed)
 Recommendations reviewed with pt as per Dr. Madireddy's note.  Pt verbalized understanding and had no additional questions.

## 2023-01-13 NOTE — Telephone Encounter (Signed)
Called patient.  Patient has consult visit scheduled for 03/03/2023 at 11:00 am with Dr. Vassie Loll.  This is the first 30 minute consult visit available.

## 2023-01-13 NOTE — Addendum Note (Signed)
Addended by: Eleonore Chiquito on: 01/13/2023 03:01 PM   Modules accepted: Orders

## 2023-01-13 NOTE — Telephone Encounter (Signed)
Pt states that she has a "squeakiness" in her chest in the evening and during the night. Pt states it started 3-4 days ago. Pt states that she has gained 2 pounds since her 9/27 visit. Pt states that she was advised to change her Torsemide to 20 mg daily rather than 20 mg Bid. Pt states that she is taking Torsemide 20 mg in the am and 10 mg in the pm because her husband felt the change was to drastic. Please advise

## 2023-01-13 NOTE — Telephone Encounter (Signed)
Pt c/o medication issue:  1. Name of Medication:   torsemide (DEMADEX) 20 MG tablet    2. How are you currently taking this medication (dosage and times per day)?  Take 1 tablet (20 mg total) by mouth daily.       3. Are you having a reaction (difficulty breathing--STAT)? No  4. What is your medication issue? Pt is requesting a callback regarding this medication causing her to have a "squeakiness in her chest". Please advise

## 2023-01-18 NOTE — Telephone Encounter (Signed)
Pt called in to give update on how she is feeling

## 2023-01-18 NOTE — Telephone Encounter (Signed)
Spoke with pt she stated that she still does not have the same energy as before. She has an appt with Dr. Freida Busman Feb 22, 2023. She will be getting blood work as ordered by PCP and Dr. Bing Matter soon. Encouraged to call with any problems or questions. Pt verbalized understanding.

## 2023-01-19 LAB — BASIC METABOLIC PANEL
BUN/Creatinine Ratio: 22 (ref 12–28)
BUN: 25 mg/dL (ref 8–27)
CO2: 27 mmol/L (ref 20–29)
Calcium: 9.6 mg/dL (ref 8.7–10.3)
Chloride: 100 mmol/L (ref 96–106)
Creatinine, Ser: 1.12 mg/dL — ABNORMAL HIGH (ref 0.57–1.00)
Glucose: 97 mg/dL (ref 70–99)
Potassium: 4.3 mmol/L (ref 3.5–5.2)
Sodium: 141 mmol/L (ref 134–144)
eGFR: 50 mL/min/{1.73_m2} — ABNORMAL LOW (ref >59)

## 2023-01-19 LAB — PRO B NATRIURETIC PEPTIDE: NT-Pro BNP: 9910 pg/mL — ABNORMAL HIGH (ref 0–738)

## 2023-01-20 ENCOUNTER — Telehealth: Payer: Self-pay | Admitting: Cardiology

## 2023-01-20 NOTE — Telephone Encounter (Signed)
   Pt said, she did her labwork yesterday and would like to let RN Dede know

## 2023-01-20 NOTE — Telephone Encounter (Signed)
Called patient and she informed us that she had a BMP and a Pro BNP  lab drawn. I explained that I would let the nurse DeDe know when she returned. Patient verbalized understanding and had no further questions at this time.

## 2023-01-21 ENCOUNTER — Emergency Department (HOSPITAL_BASED_OUTPATIENT_CLINIC_OR_DEPARTMENT_OTHER): Payer: Medicare HMO

## 2023-01-21 ENCOUNTER — Encounter (HOSPITAL_BASED_OUTPATIENT_CLINIC_OR_DEPARTMENT_OTHER): Payer: Self-pay | Admitting: Emergency Medicine

## 2023-01-21 ENCOUNTER — Inpatient Hospital Stay (HOSPITAL_BASED_OUTPATIENT_CLINIC_OR_DEPARTMENT_OTHER)
Admission: EM | Admit: 2023-01-21 | Discharge: 2023-01-26 | DRG: 280 | Disposition: A | Discharge status: Home / Self Care | Payer: Medicare HMO | Attending: Cardiovascular Disease | Admitting: Cardiovascular Disease

## 2023-01-21 ENCOUNTER — Other Ambulatory Visit: Payer: Self-pay

## 2023-01-21 DIAGNOSIS — Z79899 Other long term (current) drug therapy: Secondary | ICD-10-CM

## 2023-01-21 DIAGNOSIS — I5023 Acute on chronic systolic (congestive) heart failure: Principal | ICD-10-CM | POA: Diagnosis present

## 2023-01-21 DIAGNOSIS — M1712 Unilateral primary osteoarthritis, left knee: Secondary | ICD-10-CM | POA: Diagnosis present

## 2023-01-21 DIAGNOSIS — Z880 Allergy status to penicillin: Secondary | ICD-10-CM

## 2023-01-21 DIAGNOSIS — E7800 Pure hypercholesterolemia, unspecified: Secondary | ICD-10-CM | POA: Diagnosis present

## 2023-01-21 DIAGNOSIS — I130 Hypertensive heart and chronic kidney disease with heart failure and stage 1 through stage 4 chronic kidney disease, or unspecified chronic kidney disease: Principal | ICD-10-CM | POA: Diagnosis present

## 2023-01-21 DIAGNOSIS — Z7983 Long term (current) use of bisphosphonates: Secondary | ICD-10-CM

## 2023-01-21 DIAGNOSIS — E785 Hyperlipidemia, unspecified: Secondary | ICD-10-CM | POA: Diagnosis present

## 2023-01-21 DIAGNOSIS — I2510 Atherosclerotic heart disease of native coronary artery without angina pectoris: Secondary | ICD-10-CM | POA: Diagnosis present

## 2023-01-21 DIAGNOSIS — N179 Acute kidney failure, unspecified: Secondary | ICD-10-CM | POA: Diagnosis present

## 2023-01-21 DIAGNOSIS — N1831 Chronic kidney disease, stage 3a: Secondary | ICD-10-CM | POA: Diagnosis present

## 2023-01-21 DIAGNOSIS — Z79890 Hormone replacement therapy (postmenopausal): Secondary | ICD-10-CM

## 2023-01-21 DIAGNOSIS — I420 Dilated cardiomyopathy: Secondary | ICD-10-CM | POA: Diagnosis present

## 2023-01-21 DIAGNOSIS — Z7982 Long term (current) use of aspirin: Secondary | ICD-10-CM

## 2023-01-21 DIAGNOSIS — Z853 Personal history of malignant neoplasm of breast: Secondary | ICD-10-CM

## 2023-01-21 DIAGNOSIS — Z882 Allergy status to sulfonamides status: Secondary | ICD-10-CM

## 2023-01-21 DIAGNOSIS — I959 Hypotension, unspecified: Secondary | ICD-10-CM | POA: Diagnosis present

## 2023-01-21 DIAGNOSIS — I214 Non-ST elevation (NSTEMI) myocardial infarction: Secondary | ICD-10-CM | POA: Diagnosis present

## 2023-01-21 DIAGNOSIS — I472 Ventricular tachycardia, unspecified: Secondary | ICD-10-CM | POA: Diagnosis present

## 2023-01-21 DIAGNOSIS — Z96651 Presence of right artificial knee joint: Secondary | ICD-10-CM | POA: Diagnosis present

## 2023-01-21 DIAGNOSIS — I252 Old myocardial infarction: Secondary | ICD-10-CM

## 2023-01-21 DIAGNOSIS — I10 Essential (primary) hypertension: Secondary | ICD-10-CM | POA: Diagnosis present

## 2023-01-21 DIAGNOSIS — Z9221 Personal history of antineoplastic chemotherapy: Secondary | ICD-10-CM

## 2023-01-21 DIAGNOSIS — Z8616 Personal history of COVID-19: Secondary | ICD-10-CM

## 2023-01-21 DIAGNOSIS — Z7901 Long term (current) use of anticoagulants: Secondary | ICD-10-CM

## 2023-01-21 DIAGNOSIS — Z885 Allergy status to narcotic agent status: Secondary | ICD-10-CM

## 2023-01-21 DIAGNOSIS — E039 Hypothyroidism, unspecified: Secondary | ICD-10-CM | POA: Diagnosis present

## 2023-01-21 DIAGNOSIS — Z923 Personal history of irradiation: Secondary | ICD-10-CM

## 2023-01-21 DIAGNOSIS — I509 Heart failure, unspecified: Secondary | ICD-10-CM | POA: Insufficient documentation

## 2023-01-21 DIAGNOSIS — Z8249 Family history of ischemic heart disease and other diseases of the circulatory system: Secondary | ICD-10-CM

## 2023-01-21 LAB — BASIC METABOLIC PANEL
Anion gap: 15 (ref 5–15)
BUN: 45 mg/dL — ABNORMAL HIGH (ref 8–23)
CO2: 26 mmol/L (ref 22–32)
Calcium: 9.1 mg/dL (ref 8.9–10.3)
Chloride: 95 mmol/L — ABNORMAL LOW (ref 98–111)
Creatinine, Ser: 1.25 mg/dL — ABNORMAL HIGH (ref 0.44–1.00)
GFR, Estimated: 44 mL/min — ABNORMAL LOW (ref >60)
Glucose, Bld: 143 mg/dL — ABNORMAL HIGH (ref 70–99)
Potassium: 3.5 mmol/L (ref 3.5–5.1)
Sodium: 136 mmol/L (ref 135–145)

## 2023-01-21 LAB — CBC WITH DIFFERENTIAL/PLATELET
Abs Immature Granulocytes: 0.04 10*3/uL (ref 0.00–0.07)
Basophils Absolute: 0.1 10*3/uL (ref 0.0–0.1)
Basophils Relative: 1 %
Eosinophils Absolute: 0 10*3/uL (ref 0.0–0.5)
Eosinophils Relative: 0 %
HCT: 37.4 % (ref 36.0–46.0)
Hemoglobin: 11.9 g/dL — ABNORMAL LOW (ref 12.0–15.0)
Immature Granulocytes: 0 %
Lymphocytes Relative: 22 %
Lymphs Abs: 2.2 10*3/uL (ref 0.7–4.0)
MCH: 26.6 pg (ref 26.0–34.0)
MCHC: 31.8 g/dL (ref 30.0–36.0)
MCV: 83.5 fL (ref 80.0–100.0)
Monocytes Absolute: 0.9 10*3/uL (ref 0.1–1.0)
Monocytes Relative: 10 %
Neutro Abs: 6.4 10*3/uL (ref 1.7–7.7)
Neutrophils Relative %: 67 %
Platelets: 282 10*3/uL (ref 150–400)
RBC: 4.48 MIL/uL (ref 3.87–5.11)
RDW: 16.6 % — ABNORMAL HIGH (ref 11.5–15.5)
WBC: 9.6 10*3/uL (ref 4.0–10.5)
nRBC: 0 % (ref 0.0–0.2)

## 2023-01-21 LAB — BRAIN NATRIURETIC PEPTIDE: B Natriuretic Peptide: 3090.9 pg/mL — ABNORMAL HIGH (ref 0.0–100.0)

## 2023-01-21 LAB — TROPONIN I (HIGH SENSITIVITY): Troponin I (High Sensitivity): 11645 ng/L (ref <18)

## 2023-01-21 MED ORDER — HEPARIN (PORCINE) 25000 UT/250ML-% IV SOLN
INTRAVENOUS | Status: AC
  Administered 2023-01-21: 650 [IU]/h via INTRAVENOUS
  Filled 2023-01-21: qty 250

## 2023-01-21 MED ORDER — FUROSEMIDE 10 MG/ML IJ SOLN
40.0000 mg | Freq: Once | INTRAMUSCULAR | Status: AC
  Administered 2023-01-21: 40 mg via INTRAVENOUS
  Filled 2023-01-21: qty 4

## 2023-01-21 MED ORDER — MAGNESIUM OXIDE -MG SUPPLEMENT 400 (240 MG) MG PO TABS
800.0000 mg | ORAL_TABLET | Freq: Once | ORAL | Status: AC
  Administered 2023-01-21: 800 mg via ORAL
  Filled 2023-01-21: qty 2

## 2023-01-21 MED ORDER — POTASSIUM CHLORIDE CRYS ER 20 MEQ PO TBCR
40.0000 meq | EXTENDED_RELEASE_TABLET | Freq: Once | ORAL | Status: AC
  Administered 2023-01-21: 40 meq via ORAL
  Filled 2023-01-21: qty 2

## 2023-01-21 MED ORDER — HEPARIN BOLUS VIA INFUSION
3300.0000 [IU] | Freq: Once | INTRAVENOUS | Status: AC
  Administered 2023-01-21: 3300 [IU] via INTRAVENOUS

## 2023-01-21 MED ORDER — HEPARIN BOLUS VIA INFUSION: 3300.0000 [IU] | Freq: Once | INTRAVENOUS | Status: DC

## 2023-01-21 MED ORDER — HEPARIN (PORCINE) 25000 UT/250ML-% IV SOLN: 650.0000 [IU]/h | INTRAVENOUS | Status: DC

## 2023-01-21 MED ORDER — HEPARIN (PORCINE) 25000 UT/250ML-% IV SOLN
1000.0000 [IU]/h | INTRAVENOUS | Status: DC
  Administered 2023-01-21: 650 [IU]/h via INTRAVENOUS
  Administered 2023-01-23 – 2023-01-25 (×3): 1000 [IU]/h via INTRAVENOUS
  Filled 2023-01-21 (×3): qty 250

## 2023-01-21 NOTE — ED Triage Notes (Signed)
Pt to ER with c/o [facility] ongoing since early September.  Pt was seen by cardiology and was called back today and told that the bloodwork shows that shows that she is retaining fluid and that she should come the ED for further evaluation.

## 2023-01-21 NOTE — ED Notes (Signed)
Confirmed with MD floyd about beginning heparin infusion, sts cardiology does want heparin for pt. Pharmacy to dose.

## 2023-01-21 NOTE — Progress Notes (Signed)
PHARMACY - ANTICOAGULATION CONSULT NOTE  Pharmacy Consult for heparin Indication: chest pain/ACS  Allergies  Allergen Reactions   Penicillins Itching and Other (See Comments)    Vulvovaginal Candidiasis with associated pruritis  Vaginal Infections  Vaginal Infections    Vulvovaginal Candidiasis with associated pruritis    Vulvovaginal Candidiasis with associated pruritis Vaginal Infections Vaginal Infections   Sulfamethoxazole-Trimethoprim Other (See Comments)    Makes her shaky and sweat   Tramadol Other (See Comments)    Makes her shaky and sweat    Patient Measurements: Height: 4\' 11"  (149.9 cm) Weight: 56 kg (123 lb 7.3 oz) IBW/kg (Calculated) : 43.2 Heparin Dosing Weight: 54.6 kg  Vital Signs: Temp: 97.6 F (36.4 C) (10/10 1852) BP: 100/75 (10/10 2045) Pulse Rate: 88 (10/10 2045)  Labs: Recent Labs    01/21/23 1948  HGB 11.9*  HCT 37.4  PLT 282  CREATININE 1.25*  TROPONINIHS 11,645*    Estimated Creatinine Clearance: 27.8 mL/min (A) (by C-G formula based on SCr of 1.25 mg/dL (H)).   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   Dilated cardiomyopathy (HCC) 04/10/2018   Dyspepsia 01/18/2017   Dysplastic nevus 12/02/2016   Glaucoma 10/14/2017   Hiatal hernia 07/20/2019   High cholesterol    History of breast cancer 04/23/2020   Hyperlipidemia 04/18/2014   Hypertension    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   Hypotension 06/09/2016   Hypothyroidism 04/18/2014   Impaired functional mobility, balance, gait,  and endurance 08/07/2022   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 Pt was given copy of recent ECHO report.  Last Assessment & Plan:  Pt will f/u w/ Cardiology re this. Pt was given copy of recent ECHO report.    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 Added automatically from request for surgery [id number] Formatting of this note might be different from the original. Added automatically from request for surgery [id number]   Osteopenia of hip 04/21/2020   Formatting of this note might be different from the original. DEXA 06/2017: Lumbar T score -1.4, right total femur -1.4. She restarted alendronate since 05/2019. Formatting of this note might be different from the original. DEXA 06/2017: Lumbar T score -1.4, right total femur -1.4. She restarted alendronate since 05/2019.   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 Start using Flonase daily. Stop using OTC Afrin Formatting of this note might be different from the original. Last Assessment & Plan: Start using Flonase daily. Stop using OTC Afrin Formatting of this note might be different from the original. Overview: Last Assessment &  Plan: Start using Flonase daily. Stop usin   Stage 3a chronic kidney disease (HCC) 04/01/2021   Trigger finger of left hand 07/26/2018     Assessment: 77YOF with a PMH asthma, hypertension, cardiomyopathy, CHF, CKD presented to the ED with SOB and elevated troponin. Patient is not on  anticoagulation PTA. Pharmacy consulted to dose heparin.  Hgb 11.9, plt 282 stable Troponin 16109, elevated No signs/symptoms of bleeding noted   Goal of Therapy:  Heparin level 0.3-0.7 units/ml Monitor platelets by anticoagulation protocol: Yes   Plan:  Give 3300 units heparin bolus x 1 Start heparin infusion at 650 units/hr Check anti-Xa level in 8 hours and daily while on heparin Continue to monitor CBC and signs/symptoms of bleeding  Stephenie Acres, PharmD PGY1 Pharmacy Resident 01/21/2023 9:16 PM

## 2023-01-21 NOTE — ED Notes (Signed)
Spoke to [employer] at [facility] ED pharmacy about heparin dosages and orders. Confirmed that cardiology and floyd wanted heparin started and will reorder bolus.

## 2023-01-21 NOTE — ED Notes (Signed)
Date and time results received: 01/21/23 2018 (use smartphrase ".now" to insert current time)  Test: trop Critical Value: 11,645  Name of Provider Notified: floyd  Orders Received? Or Actions Taken?: Orders Received - See Orders for details

## 2023-01-21 NOTE — ED Notes (Signed)
Pt. During conversation reports to RN she has had no chest pain and continues to have no chest pain.    Pt. Does report that last night she had a sharp pain in the back of her neck and it went away.  Pt. States she does feel a little sore in her neck but not much.  Pt. Reports that she felt short of breath sitting at home today at approx. 4:30 and she had no pain with this shortness of breath.

## 2023-01-21 NOTE — ED Provider Notes (Addendum)
[name] Provider Note   CSN: [id number] Arrival date & time: 01/21/23  1842     History  Chief Complaint  Patient presents with   Shortness of Breath    Nichole Johnson is a 77 y.o. female.  77 yo F with a chief paints of difficulty breathing.  This been going on for about a month now.  She recently traveled [place] and was hospitalized due to COVID.  Since then she has not felt back to normal.  She is seeing her PCP her cardiologist and a pulmonologist for this.  She had a proBNP done recently that was elevated and they told her to come to the emergency department for evaluation.  Does not sound like she has had any significant change recently.  No chest pain.  No cough or congestion.  She told me that her cardiologist had her half her diuretic dosing and that is when she felt she started having trouble breathing.   Shortness of Breath      Home Medications Prior to Admission medications   Medication Sig Start Date End Date Taking? Authorizing Provider  albuterol (PROVENTIL HFA;VENTOLIN HFA) 108 (90 Base) MCG/ACT inhaler Inhale 2 puffs into the lungs every 6 (six) hours as needed for wheezing or shortness of breath. 01/01/16   Mannam, Colbert Coyer, MD  alendronate (FOSAMAX) 5 MG tablet Take 5 mg by mouth once a week. Take with a full glass of water on an empty stomach.    [provider]  aspirin EC (ASPIRIN 81) 81 MG tablet Take 1 tablet (81 mg total) by mouth every morning. 11/06/21   Georgeanna Lea, MD  brinzolamide (AZOPT) 1 % ophthalmic suspension Place 1 drop into both eyes 2 (two) times daily.    [provider]  Calcium Carbonate-Vitamin D (CALCIUM + D PO) Take 1 tablet by mouth 2 (two) times daily.     [provider]  Cholecalciferol 25 MCG (1000 UT) tablet Take 1,000 Units by mouth daily.     [provider]  ferrous sulfate 325 (65 FE) MG tablet Take 325 mg by mouth daily.    [provider]  fluticasone (FLONASE) 50 MCG/ACT nasal spray Place 2 sprays into both nostrils daily. 01/01/16   Mannam, Colbert Coyer, MD  levothyroxine (SYNTHROID, LEVOTHROID) 100 MCG tablet Take 100 mcg by mouth daily. 07/20/16   [provider]  Loratadine 10 MG CAPS Take 10 mg by mouth daily.    [provider]  metoprolol succinate (TOPROL-XL) 50 MG 24 hr tablet Take 1 tablet (50 mg total) by mouth daily. 10/26/22   Georgeanna Lea, MD  Multiple Vitamins-Minerals (CENTRUM SILVER PO) Take 1 tablet by mouth every morning.    [provider]  psyllium (METAMUCIL) 58.6 % packet Take 1 packet by mouth daily as needed (constipation).    [provider]  simvastatin (ZOCOR) 20 MG tablet Take 20 mg by mouth daily.    [provider]  torsemide (DEMADEX) 20 MG tablet Take 1 tablet (20 mg total) by mouth 2 (two) times daily. 01/13/23   Revankar, Aundra Dubin, MD  Travoprost, BAK Free, (TRAVATAN) 0.004 % SOLN ophthalmic solution Place 1 drop into both eyes at bedtime.    [provider]  valsartan (DIOVAN) 40 MG tablet Take 1 tablet (40 mg total) by mouth daily. 08/17/22   Flossie Dibble, NP  vitamin B-12 (CYANOCOBALAMIN) 1000 MCG tablet Take 1,000 mcg by mouth daily.  [provider]  vitamin C (ASCORBIC ACID) 500 MG tablet Take 500 mg by mouth daily.    [provider]      Allergies    Penicillins, Sulfamethoxazole-trimethoprim, and Tramadol    Review of Systems   Review of Systems  Respiratory:  Positive for shortness of breath.     Physical Exam Updated Vital Signs BP 106/75   Pulse 98   Temp 97.6 F (36.4 C)   Resp (!) 23   Ht 4\' 11"  (1.499 m)   Wt 56 kg   SpO2 98%   BMI 24.94 kg/m  Physical Exam Vitals and nursing note reviewed.  Constitutional:      General: She is not in acute distress.    Appearance: She is well-developed. She is not diaphoretic.  HENT:     Head: Normocephalic and atraumatic.  Eyes:      Pupils: Pupils are equal, round, and reactive to light.  Neck:     Vascular: No JVD.  Cardiovascular:     Rate and Rhythm: Normal rate and regular rhythm.     Heart sounds: No murmur heard.    No friction rub. No gallop.  Pulmonary:     Effort: Pulmonary effort is normal.     Breath sounds: Rales present. No wheezing.     Comments: Perhaps faint rales at the bases worse on the right than the left. Abdominal:     General: There is no distension.     Palpations: Abdomen is soft.     Tenderness: There is no abdominal tenderness.  Musculoskeletal:        General: No tenderness.     Cervical back: Normal range of motion and neck supple.     Comments: Perhaps trace edema  Skin:    General: Skin is warm and dry.  Neurological:     Mental Status: She is alert and oriented to person, place, and time.  Psychiatric:        Behavior: Behavior normal.     ED Results / Procedures / Treatments   Labs (all labs ordered are listed, but only abnormal results are displayed) Labs Reviewed  CBC WITH DIFFERENTIAL/PLATELET - Abnormal; Notable for the following components:      Result Value   Hemoglobin 11.9 (*)    RDW 16.6 (*)    All other components within normal limits  BASIC METABOLIC PANEL - Abnormal; Notable for the following components:   Chloride 95 (*)    Glucose, Bld 143 (*)    BUN 45 (*)    Creatinine, Ser 1.25 (*)    GFR, Estimated 44 (*)    All other components within normal limits  BRAIN NATRIURETIC PEPTIDE - Abnormal; Notable for the following components:   B Natriuretic Peptide 3,090.9 (*)    All other components within normal limits  TROPONIN I (HIGH SENSITIVITY) - Abnormal; Notable for the following components:   Troponin I (High Sensitivity) 11,645 (*)    All other components within normal limits  HEPARIN LEVEL (UNFRACTIONATED)    EKG EKG Interpretation Date/Time:  Thursday January 21 2023 18:53:00 EDT Ventricular Rate:  98 PR Interval:  198 QRS Duration:  100 QT  Interval:  348 QTC Calculation: 445 R Axis:   -57  Text Interpretation: Sinus rhythm Biatrial enlargement LVH with secondary repolarization abnormality No significant change since last tracing Confirmed by Melene Plan [id number]) on 01/21/2023 7:00:43 PM  Radiology No results found.  Procedures .Critical Care  Performed by: Melene Plan, DO  Authorized by: Melene Plan, DO   Critical care provider statement:    Critical care time (minutes):  35   Critical care time was exclusive of:  Separately billable procedures and treating other patients   Critical care was time spent personally by me on the following activities:  Development of treatment plan with patient or surrogate, discussions with consultants, evaluation of patient's response to treatment, examination of patient, ordering and review of laboratory studies, ordering and review of radiographic studies, ordering and performing treatments and interventions, pulse oximetry, re-evaluation of patient's condition and review of old charts   Care discussed with: admitting provider        EMERGENCY DEPARTMENT [place] CARDIAC EXAM "Study: Limited Ultrasound of the Heart and Pericardium"  INDICATIONS:Dyspnea Multiple views of the heart and pericardium were obtained in real-time with a multi-frequency probe.  PERFORMED UX:NATFTD IMAGES ARCHIVED?: Yes LIMITATIONS:  None VIEWS USED: Subcostal 4 chamber, Apical 4 chamber , and Inferior Vena Cava INTERPRETATION: Cardiac activity present, Pericardial effusioin absent, Cardiac tamponade absent, Decreased contractility, and IVC dilated    Medications Ordered in ED Medications  heparin bolus via infusion 3,300 Units (has no administration in time range)  heparin ADULT infusion 100 units/mL (25000 units/215mL) (has no administration in time range)  potassium chloride SA (KLOR-CON M) CR tablet 40 mEq (40 mEq Oral Given 01/21/23 2040)  magnesium oxide (MAG-OX) tablet 800 mg (800 mg Oral Given 01/21/23 2040)   furosemide (LASIX) injection 40 mg (40 mg Intravenous Given 01/21/23 2036)    ED Course/ Medical Decision Making/ A&P                                 Medical Decision Making Amount and/or Complexity of Data Reviewed Labs: ordered. Radiology: ordered.  Risk OTC drugs. Prescription drug management. Decision regarding hospitalization.   77 yo F with a chief complaint of difficulty breathing.  This has been ongoing for at least a month.  She was diagnosed with COVID at the onset and since then has not gotten back to baseline.  She has symptoms most the time when she tries to go to sleep.  Has been sleeping elevated with some improvement.  Has seen her PCP her cardiologist and pulmonologist since then.  Had a BNP that was done in the cardiology office that was elevated and was told to come to the emergency department for evaluation.  She appears well for me on exam.  No tachypnea.  She is lying flat on my initial exam without any obvious increased work of breathing.  I will repeat her blood work here.  Chest x-ray.  Chest x-ray on my independent interpretation with an irregular heart border.  Bedside ultrasound was performed to assess for possible Pericardial effusion.  No effusion was noted, patient with decreased EF consistent with recent echo.  No obvious dilation of the RV.  IVC is dilated.  Patient's troponin is 11,000.  Sounds like she had an elevated troponin when she was in [place] with COVID.  BNP is 3000.  Has been assuming her lab results was 110 troponin I.  I discussed the case with Dr. Brayton Layman, cardiology fellow on-call.  He recommended diuresis and cardiology admission. Heparin gtt.  The patients results and plan were reviewed and discussed.   Any x-rays performed were independently reviewed by myself.   Differential diagnosis were considered with the presenting HPI.  Medications  potassium chloride SA (KLOR-CON M) CR tablet  40 mEq (40 mEq Oral Given 01/21/23 2040)   magnesium oxide (MAG-OX) tablet 800 mg (800 mg Oral Given 01/21/23 2040)  furosemide (LASIX) injection 40 mg (40 mg Intravenous Given 01/21/23 2036)    Vitals:   01/21/23 1851 01/21/23 1852  BP:  106/75  Pulse:  98  Resp:  (!) 23  Temp:  97.6 F (36.4 C)  SpO2:  98%  Weight: 56 kg   Height: 4\' 11"  (1.499 m)     Final diagnoses:  Acute on chronic systolic congestive heart failure (HCC)    Admission/ observation were discussed with the admitting physician, patient and/or family and they are comfortable with the plan.           Final Clinical Impression(s) / ED Diagnoses Final diagnoses:  Acute on chronic systolic congestive heart failure [facility])    Rx / DC Orders ED Discharge Orders     None         Melene Plan, DO 01/21/23 2049

## 2023-01-21 NOTE — ED Notes (Signed)
Patient continues to stay pain free. Patient states her breathing has gotten much easier. Urine in canister is clear and plentiful.

## 2023-01-21 NOTE — ED Notes (Signed)
Patient denies pain and is resting comfortably.  

## 2023-01-21 NOTE — ED Notes (Signed)
Pt. Reports no chest pain at all today or any other time.

## 2023-01-22 ENCOUNTER — Other Ambulatory Visit (HOSPITAL_COMMUNITY): Payer: Self-pay

## 2023-01-22 DIAGNOSIS — I420 Dilated cardiomyopathy: Secondary | ICD-10-CM | POA: Diagnosis present

## 2023-01-22 DIAGNOSIS — Z882 Allergy status to sulfonamides status: Secondary | ICD-10-CM | POA: Diagnosis not present

## 2023-01-22 DIAGNOSIS — Z96651 Presence of right artificial knee joint: Secondary | ICD-10-CM | POA: Diagnosis present

## 2023-01-22 DIAGNOSIS — I472 Ventricular tachycardia, unspecified: Secondary | ICD-10-CM | POA: Diagnosis present

## 2023-01-22 DIAGNOSIS — Z7901 Long term (current) use of anticoagulants: Secondary | ICD-10-CM | POA: Diagnosis not present

## 2023-01-22 DIAGNOSIS — E039 Hypothyroidism, unspecified: Secondary | ICD-10-CM | POA: Diagnosis present

## 2023-01-22 DIAGNOSIS — Z7983 Long term (current) use of bisphosphonates: Secondary | ICD-10-CM | POA: Diagnosis not present

## 2023-01-22 DIAGNOSIS — I5023 Acute on chronic systolic (congestive) heart failure: Secondary | ICD-10-CM | POA: Diagnosis present

## 2023-01-22 DIAGNOSIS — I214 Non-ST elevation (NSTEMI) myocardial infarction: Secondary | ICD-10-CM | POA: Diagnosis present

## 2023-01-22 DIAGNOSIS — Z7982 Long term (current) use of aspirin: Secondary | ICD-10-CM | POA: Diagnosis not present

## 2023-01-22 DIAGNOSIS — Z923 Personal history of irradiation: Secondary | ICD-10-CM | POA: Diagnosis not present

## 2023-01-22 DIAGNOSIS — Z8616 Personal history of COVID-19: Secondary | ICD-10-CM | POA: Diagnosis not present

## 2023-01-22 DIAGNOSIS — N1831 Chronic kidney disease, stage 3a: Secondary | ICD-10-CM | POA: Diagnosis present

## 2023-01-22 DIAGNOSIS — I130 Hypertensive heart and chronic kidney disease with heart failure and stage 1 through stage 4 chronic kidney disease, or unspecified chronic kidney disease: Secondary | ICD-10-CM | POA: Diagnosis present

## 2023-01-22 DIAGNOSIS — Z885 Allergy status to narcotic agent status: Secondary | ICD-10-CM | POA: Diagnosis not present

## 2023-01-22 DIAGNOSIS — Z79899 Other long term (current) drug therapy: Secondary | ICD-10-CM | POA: Diagnosis not present

## 2023-01-22 DIAGNOSIS — Z79890 Hormone replacement therapy (postmenopausal): Secondary | ICD-10-CM | POA: Diagnosis not present

## 2023-01-22 DIAGNOSIS — I5021 Acute systolic (congestive) heart failure: Secondary | ICD-10-CM | POA: Diagnosis not present

## 2023-01-22 DIAGNOSIS — I959 Hypotension, unspecified: Secondary | ICD-10-CM | POA: Diagnosis present

## 2023-01-22 DIAGNOSIS — Z853 Personal history of malignant neoplasm of breast: Secondary | ICD-10-CM | POA: Diagnosis not present

## 2023-01-22 DIAGNOSIS — E7800 Pure hypercholesterolemia, unspecified: Secondary | ICD-10-CM | POA: Diagnosis present

## 2023-01-22 DIAGNOSIS — N179 Acute kidney failure, unspecified: Secondary | ICD-10-CM | POA: Diagnosis present

## 2023-01-22 DIAGNOSIS — Z880 Allergy status to penicillin: Secondary | ICD-10-CM | POA: Diagnosis not present

## 2023-01-22 DIAGNOSIS — Z8249 Family history of ischemic heart disease and other diseases of the circulatory system: Secondary | ICD-10-CM | POA: Diagnosis not present

## 2023-01-22 DIAGNOSIS — I2510 Atherosclerotic heart disease of native coronary artery without angina pectoris: Secondary | ICD-10-CM | POA: Diagnosis present

## 2023-01-22 DIAGNOSIS — I5043 Acute on chronic combined systolic (congestive) and diastolic (congestive) heart failure: Secondary | ICD-10-CM | POA: Diagnosis not present

## 2023-01-22 LAB — CBC
HCT: 42.4 % (ref 36.0–46.0)
Hemoglobin: 13.6 g/dL (ref 12.0–15.0)
MCH: 27 pg (ref 26.0–34.0)
MCHC: 32.1 g/dL (ref 30.0–36.0)
MCV: 84.3 fL (ref 80.0–100.0)
Platelets: 328 10*3/uL (ref 150–400)
RBC: 5.03 MIL/uL (ref 3.87–5.11)
RDW: 16.9 % — ABNORMAL HIGH (ref 11.5–15.5)
WBC: 10.8 10*3/uL — ABNORMAL HIGH (ref 4.0–10.5)
nRBC: 0 % (ref 0.0–0.2)

## 2023-01-22 LAB — TROPONIN I (HIGH SENSITIVITY): Troponin I (High Sensitivity): 8773 ng/L (ref <18)

## 2023-01-22 LAB — HEPARIN LEVEL (UNFRACTIONATED)
Heparin Unfractionated: 0.39 [IU]/mL (ref 0.30–0.70)
Heparin Unfractionated: 0.16 [IU]/mL — ABNORMAL LOW (ref 0.30–0.70)
Heparin Unfractionated: 0.28 [IU]/mL — ABNORMAL LOW (ref 0.30–0.70)

## 2023-01-22 MED ORDER — ONDANSETRON HCL 4 MG/2ML IJ SOLN: 4.0000 mg | Freq: Four times a day (QID) | INTRAMUSCULAR | Status: DC | PRN

## 2023-01-22 MED ORDER — FUROSEMIDE 10 MG/ML IJ SOLN: 40.0000 mg | Freq: Two times a day (BID) | INTRAMUSCULAR | Status: DC

## 2023-01-22 MED ORDER — ALBUTEROL SULFATE HFA 108 (90 BASE) MCG/ACT IN AERS: 2.0000 | INHALATION_SPRAY | Freq: Four times a day (QID) | RESPIRATORY_TRACT | Status: DC | PRN

## 2023-01-22 MED ORDER — ACETAMINOPHEN 325 MG PO TABS: 650.0000 mg | ORAL_TABLET | ORAL | Status: DC | PRN

## 2023-01-22 MED ORDER — LOSARTAN POTASSIUM 25 MG PO TABS
12.5000 mg | ORAL_TABLET | Freq: Every day | ORAL | Status: DC
  Filled 2023-01-22: qty 1

## 2023-01-22 MED ORDER — ATORVASTATIN CALCIUM 40 MG PO TABS
40.0000 mg | ORAL_TABLET | Freq: Every day | ORAL | Status: DC
  Administered 2023-01-22 – 2023-01-26 (×5): 40 mg via ORAL
  Filled 2023-01-22 (×5): qty 1

## 2023-01-22 MED ORDER — NITROGLYCERIN 0.4 MG SL SUBL: 0.4000 mg | SUBLINGUAL_TABLET | SUBLINGUAL | Status: DC | PRN

## 2023-01-22 MED ORDER — LEVOTHYROXINE SODIUM 100 MCG PO TABS
100.0000 ug | ORAL_TABLET | Freq: Every day | ORAL | Status: DC
  Administered 2023-01-23 – 2023-01-26 (×4): 100 ug via ORAL
  Filled 2023-01-22 (×4): qty 1

## 2023-01-22 MED ORDER — SPIRONOLACTONE 12.5 MG HALF TABLET
12.5000 mg | ORAL_TABLET | Freq: Every day | ORAL | Status: DC
  Filled 2023-01-22: qty 1

## 2023-01-22 MED ORDER — DAPAGLIFLOZIN PROPANEDIOL 10 MG PO TABS: 10.0000 mg | ORAL_TABLET | Freq: Every day | ORAL | Status: DC

## 2023-01-22 MED ORDER — ASPIRIN 81 MG PO TBEC
81.0000 mg | DELAYED_RELEASE_TABLET | Freq: Every morning | ORAL | Status: DC
  Administered 2023-01-23 – 2023-01-25 (×3): 81 mg via ORAL
  Filled 2023-01-22 (×3): qty 1

## 2023-01-22 NOTE — Progress Notes (Signed)
PHARMACY - ANTICOAGULATION CONSULT NOTE  Pharmacy Consult for heparin Indication: chest pain/ACS  Allergies  Allergen Reactions   Penicillins Itching and Other (See Comments)    Vulvovaginal Candidiasis with associated pruritis  Vaginal Infections  Vaginal Infections    Vulvovaginal Candidiasis with associated pruritis    Vulvovaginal Candidiasis with associated pruritis Vaginal Infections Vaginal Infections   Sulfamethoxazole-Trimethoprim Other (See Comments)    Makes her shaky and sweat   Tramadol Other (See Comments)    Makes her shaky and sweat    Patient Measurements: Height: 4\' 11"  (149.9 cm) Weight: 56.2 kg (123 lb 14.4 oz) IBW/kg (Calculated) : 43.2 Heparin Dosing Weight: 54.6 kg  Vital Signs: Temp: 98.6 F (37 C) (10/11 2005) Temp Source: Oral (10/11 2005) BP: 82/63 (10/11 2005) Pulse Rate: 85 (10/11 2005)  Labs: Recent Labs    01/21/23 1948 01/22/23 0136 01/22/23 0506 01/22/23 1324 01/22/23 2304  HGB 11.9*  --   --  13.6  --   HCT 37.4  --   --  42.4  --   PLT 282  --   --  328  --   HEPARINUNFRC  --   --  0.39 0.16* 0.28*  CREATININE 1.25*  --   --   --   --   TROPONINIHS 11,645* 8,773*  --   --   --     Estimated Creatinine Clearance: 27.9 mL/min (A) (by C-G formula based on SCr of 1.25 mg/dL (H)).   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   Dilated cardiomyopathy (HCC) 04/10/2018   Dyspepsia 01/18/2017   Dysplastic nevus 12/02/2016   Glaucoma 10/14/2017   Hiatal hernia 07/20/2019   High cholesterol    History of  breast cancer 04/23/2020   Hyperlipidemia 04/18/2014   Hypertension    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 Pt was given copy of recent ECHO report.  Last Assessment & Plan:  Pt will f/u w/ Cardiology re this. Pt was given copy of recent ECHO report.    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 Added automatically from request for surgery [id number] Formatting of this note might be different from the original. Added automatically from request for surgery [id number]   Osteopenia of hip 04/21/2020   Formatting of this note might be different from the original. DEXA 06/2017: Lumbar T score -1.4, right total femur -1.4. She restarted alendronate since 05/2019. Formatting of this note might be different from the original. DEXA 06/2017: Lumbar T score -1.4, right total femur -1.4. She restarted alendronate since 05/2019.   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 Start using Flonase daily. Stop using OTC Afrin Formatting of this note might be different from the original. Last Assessment & Plan: Start using Flonase daily. Stop using OTC Afrin Formatting of this note might be different from the original. Overview: Last Assessment & Plan: Start using Flonase daily. Stop usin   Stage 3a chronic  kidney disease (HCC) 04/01/2021   Trigger finger of left hand 07/26/2018     Assessment: 77YOF with a PMH asthma, hypertension, cardiomyopathy, CHF, CKD presented to the ED with SOB and elevated troponin. Patient is not on anticoagulation PTA. Pharmacy consulted to dose heparin.  Hgb 11.9, plt 282 stable Troponin 46962, elevated No signs/symptoms of bleeding noted  10/11 PM: heparin level (0.28) is subtherapeutic on 850 units/hr.  Confirmed with RN that infusion was not interrupted, no other infusion issues noted. Last CBC stable  Goal of Therapy:  Heparin level 0.3-0.7 units/ml Monitor platelets by anticoagulation protocol: Yes   Plan:  Increase heparin infusion to 1000 units/hr Check anti-Xa level in 8 hours and daily while on heparin Continue to monitor CBC and signs/symptoms of bleeding Tentative plan for [facility] Monday  Arabella Merles, PharmD. Clinical Pharmacist 01/22/2023 11:55 PM

## 2023-01-22 NOTE — Plan of Care (Signed)
  Problem: Clinical Measurements: Goal: Respiratory complications will improve Outcome: Progressing Goal: Cardiovascular complication will be avoided Outcome: Progressing   Problem: Activity: Goal: Risk for activity intolerance will decrease Outcome: Progressing   Problem: Nutrition: Goal: Adequate nutrition will be maintained Outcome: Progressing   Problem: Coping: Goal: Level of anxiety will decrease Outcome: Progressing   Problem: Elimination: Goal: Will not experience complications related to bowel motility Outcome: Progressing Goal: Will not experience complications related to urinary retention Outcome: Progressing   Problem: Pain Managment: Goal: General experience of comfort will improve Outcome: Progressing   

## 2023-01-22 NOTE — ED Notes (Signed)
This RN updated patient's husband Aashritha Miedema on patient's status. Patient's husband states he is en route to [facility] ED.

## 2023-01-22 NOTE — ED Notes (Signed)
Patient repositioned in the bed. Feels more comfortable. Continues pain free at this time.

## 2023-01-22 NOTE — Progress Notes (Signed)
BP 86/70. Good diuresis today.  Discussed plan with the patient's husband.  Due to low blood pressure we are going to hold her IV Lasix.  We are going to hold plans for ARB and Aldactone.  No beta-blocker.  She did receive her home torsemide and IV Lasix today.  This could be the reason for her low blood pressure.  The husband and patient expressed a desire for conservative approach.  They would like to avoid procedures if possible.  We discussed treating her medically and seeing how she does.  I am a bit worried about her inability to tolerate GDMT.  I think a right and left heart catheterization would be indicated.  For now they would like to continue with diuresis and see how she does.  We will repeat her echocardiogram and then can make a decision about invasive treatments over the weekend.  Gerri Spore T. Flora Lipps, MD, [facility]  [facility]  [address] [place], [place] [address] [phone]  4:16 PM

## 2023-01-22 NOTE — Progress Notes (Signed)
PHARMACY - ANTICOAGULATION CONSULT NOTE  Pharmacy Consult for heparin Indication: chest pain/ACS  Allergies  Allergen Reactions   Penicillins Itching and Other (See Comments)    Vulvovaginal Candidiasis with associated pruritis  Vaginal Infections  Vaginal Infections    Vulvovaginal Candidiasis with associated pruritis    Vulvovaginal Candidiasis with associated pruritis Vaginal Infections Vaginal Infections   Sulfamethoxazole-Trimethoprim Other (See Comments)    Makes her shaky and sweat   Tramadol Other (See Comments)    Makes her shaky and sweat    Patient Measurements: Height: 4\' 11"  (149.9 cm) Weight: 56.2 kg (123 lb 14.4 oz) IBW/kg (Calculated) : 43.2 Heparin Dosing Weight: 54.6 kg  Vital Signs: Temp: 97.5 F (36.4 C) (10/11 1252) Temp Source: Oral (10/11 1252) BP: 110/80 (10/11 1252) Pulse Rate: 90 (10/11 1252)  Labs: Recent Labs    01/21/23 1948 01/22/23 0136 01/22/23 0506 01/22/23 1324  HGB 11.9*  --   --  13.6  HCT 37.4  --   --  42.4  PLT 282  --   --  328  HEPARINUNFRC  --   --  0.39 0.16*  CREATININE 1.25*  --   --   --   TROPONINIHS 11,645* 8,773*  --   --     Estimated Creatinine Clearance: 27.9 mL/min (A) (by C-G formula based on SCr of 1.25 mg/dL (H)).   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   Dilated cardiomyopathy (HCC) 04/10/2018   Dyspepsia 01/18/2017   Dysplastic nevus 12/02/2016   Glaucoma 10/14/2017   Hiatal hernia 07/20/2019   High cholesterol    History of breast cancer 04/23/2020   Hyperlipidemia  04/18/2014   Hypertension    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 Pt was given copy of recent ECHO report.  Last Assessment & Plan:  Pt will f/u w/ Cardiology re this. Pt was given copy of recent ECHO report.    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 Added automatically from request for surgery [id number] Formatting of this note might be different from the original. Added automatically from request for surgery [id number]   Osteopenia of hip 04/21/2020   Formatting of this note might be different from the original. DEXA 06/2017: Lumbar T score -1.4, right total femur -1.4. She restarted alendronate since 05/2019. Formatting of this note might be different from the original. DEXA 06/2017: Lumbar T score -1.4, right total femur -1.4. She restarted alendronate since 05/2019.   Personal history of radiation therapy 2001   Left Breast Cancer   Reactive airway disease without complication 10/14/2017   S/P total knee arthroplasty, 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   Seasonal allergies 02/26/2014  Last Assessment & Plan: Formatting of this note might be different from the original. Start using Flonase daily. Stop using OTC Afrin Formatting of this note might be different from the original. Last Assessment & Plan: Start using Flonase daily. Stop using OTC Afrin Formatting of this note might be different from the original. Overview: Last Assessment & Plan: Start using Flonase daily. Stop usin   Stage 3a chronic kidney disease (HCC) 04/01/2021   Trigger  finger of left hand 07/26/2018     Assessment: 77YOF with a PMH asthma, hypertension, cardiomyopathy, CHF, CKD presented to the ED with SOB and elevated troponin. Patient is not on anticoagulation PTA. Pharmacy consulted to dose heparin.  Hgb 11.9, plt 282 stable Troponin 95284, elevated No signs/symptoms of bleeding noted  10/11 AM: heparin level returned at 0.39 on 650 units/hr (therapeutic). No signs/symptoms of bleeding or issues with the heparin infusion per RN. Last CBC showed Hgb 11.9 and plts 282  10/11 PM: heparin level (0.16) is subtherapeutic on 650 units/hr.  Confirmed with RN that infusion was not interrupted, no other infusion issues noted.  Goal of Therapy:  Heparin level 0.3-0.7 units/ml Monitor platelets by anticoagulation protocol: Yes   Plan:  Increase heparin infusion to 850 units/hr Check confirmatory anti-Xa level in 8 hours and daily while on heparin Continue to monitor CBC and signs/symptoms of bleeding Tentative plan for [facility] Monday  Trixie Rude, PharmD Clinical Pharmacist 01/22/2023  3:02 PM

## 2023-01-22 NOTE — ED Notes (Signed)
Report called to Roney Jaffe RN

## 2023-01-22 NOTE — Progress Notes (Signed)
PHARMACY - ANTICOAGULATION CONSULT NOTE  Pharmacy Consult for heparin Indication: chest pain/ACS  Allergies  Allergen Reactions   Penicillins Itching and Other (See Comments)    Vulvovaginal Candidiasis with associated pruritis  Vaginal Infections  Vaginal Infections    Vulvovaginal Candidiasis with associated pruritis    Vulvovaginal Candidiasis with associated pruritis Vaginal Infections Vaginal Infections   Sulfamethoxazole-Trimethoprim Other (See Comments)    Makes her shaky and sweat   Tramadol Other (See Comments)    Makes her shaky and sweat    Patient Measurements: Height: 4\' 11"  (149.9 cm) Weight: 56 kg (123 lb 7.3 oz) IBW/kg (Calculated) : 43.2 Heparin Dosing Weight: 54.6 kg  Vital Signs: Temp: 97.6 F (36.4 C) (10/11 0611) Temp Source: Oral (10/11 0611) BP: 106/86 (10/11 0611) Pulse Rate: 93 (10/11 0611)  Labs: Recent Labs    01/21/23 1948 01/22/23 0136 01/22/23 0506  HGB 11.9*  --   --   HCT 37.4  --   --   PLT 282  --   --   HEPARINUNFRC  --   --  0.39  CREATININE 1.25*  --   --   TROPONINIHS 11,645* 8,773*  --     Estimated Creatinine Clearance: 27.8 mL/min (A) (by C-G formula based on SCr of 1.25 mg/dL (H)).   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   Dilated cardiomyopathy (HCC) 04/10/2018   Dyspepsia 01/18/2017   Dysplastic nevus 12/02/2016   Glaucoma 10/14/2017   Hiatal hernia 07/20/2019   High cholesterol    History of breast cancer 04/23/2020   Hyperlipidemia 04/18/2014   Hypertension    Hypertensive heart  disease with heart failure (HCC) 04/10/2018   Hypotension 06/09/2016   Hypothyroidism 04/18/2014   Impaired functional mobility, balance, gait, and endurance 08/07/2022   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 Pt was given copy of recent ECHO report.  Last Assessment & Plan:  Pt will f/u w/ Cardiology re this. Pt was given copy of recent ECHO report.    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 Added automatically from request for surgery [id number] Formatting of this note might be different from the original. Added automatically from request for surgery [id number]   Osteopenia of hip 04/21/2020   Formatting of this note might be different from the original. DEXA 06/2017: Lumbar T score -1.4, right total femur -1.4. She restarted alendronate since 05/2019. Formatting of this note might be different from the original. DEXA 06/2017: Lumbar T score -1.4, right total femur -1.4. She restarted alendronate since 05/2019.   Personal history of radiation therapy 2001   Left Breast Cancer   Reactive airway disease without complication 10/14/2017   S/P total knee arthroplasty, 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   Seasonal allergies 02/26/2014   Last Assessment & Plan: Formatting of this note might be different  from the original. Start using Flonase daily. Stop using OTC Afrin Formatting of this note might be different from the original. Last Assessment & Plan: Start using Flonase daily. Stop using OTC Afrin Formatting of this note might be different from the original. Overview: Last Assessment & Plan: Start using Flonase daily. Stop usin   Stage 3a chronic kidney disease (HCC) 04/01/2021   Trigger finger of left hand 07/26/2018      Assessment: 77YOF with a PMH asthma, hypertension, cardiomyopathy, CHF, CKD presented to the ED with SOB and elevated troponin. Patient is not on anticoagulation PTA. Pharmacy consulted to dose heparin.  Hgb 11.9, plt 282 stable Troponin 09811, elevated No signs/symptoms of bleeding noted  10/11 AM: heparin level returned at 0.39 on 650 units/hr (therapeutic). No signs/symptoms of bleeding or issues with the heparin infusion per RN. Last CBC showed Hgb 11.9 and plts 282  Goal of Therapy:  Heparin level 0.3-0.7 units/ml Monitor platelets by anticoagulation protocol: Yes   Plan:  Continue heparin infusion at 650 units/hr Check confirmatory anti-Xa level in 8 hours and daily while on heparin Continue to monitor CBC and signs/symptoms of bleeding  Arabella Merles, PharmD. Clinical Pharmacist 01/22/2023 6:25 AM

## 2023-01-22 NOTE — H&P (Addendum)
Cardiology Admission History and Physical   Patient ID: Nichole Johnson MRN: [id number]; DOB: May 10, 1945   Admission date: 01/21/2023  PCP:  [name], MD   [facility] Providers Cardiologist:  Norman Herrlich, MD    Chief Complaint:  CHF   Patient Profile:   Nichole Johnson is a 77 y.o. female with chronic systolic heart failure thought to be due to chemotherapy, history of breast cancer, hypertension, CKD who is being seen 01/22/2023 for the evaluation of CHF, elevated troponins  History of Present Illness:   Nichole Johnson is a 77 year old female with above medical history who has been followed by Dr. Dulce Sellar.  Additionally, patient has received cardiology care from [facility], [place], and [place].  Patient previously underwent echocardiogram in 2017 that showed EF 35-40%.  It was suspected that her reduced EF was due to chemotherapy.  She was started on GDMT as her BP tolerated.  Later, echocardiogram on 05/19/2016 showed EF 20-25%.  Underwent stress cardiac MRI at [facility] on 08/12/16 that showed LVEF 26%, dyssynchrony between the lateral wall and septum, wall motion abnormalities.  Delayed enhancement MRI was abnormal as there was subendocardial hyperenhancement involving the mid-distal anterior wall consistent with subendocardial infarction in the distribution of a diagonal branch of the LAD.  Adenosine stress perfusion imaging demonstrated no evidence of inducible myocardial ischemia.  In 10/2016, it was recommended by [facility] cardiology that patient consider ICD placement.  However, patient preferred to wait.  After adjusting her GDMT, repeat echo in 05/2017 showed EF 28%, severe global LV hypokinesis, normal RV size and function.  Patient later established care with Dr. Dulce Sellar in 2019, EF 20-25% at that time.  Underwent cardiac MRI on 07/12/2019 that showed normal LV chamber size with severely reduced systolic function, LVEF 20%, normal RV chamber size, mild-moderately reduced  systolic function, RVEF 34%.  There was transmural delayed myocardial enhancement in the mid-apical LV anterior wall suggestive of prior infarct.  No viability in this distribution. Repeat echo on 12/18/2020 showed spherical remodeling of the LV, EF 30-35%, grade 1 diastolic dysfunction, normal RV function, moderate TR.   Overall, patient was fairly well compensated from a heart failure standpoint until March 2024.  At that time, patient had knee replacement surgery.  After her surgery, she started to have shortness of breath, weakness, fatigue, low BP.  Underwent echocardiogram on 10/21/2022 that showed EF 30-35%, normal RV function, mildly elevated pulmonary artery systolic pressure, moderate MR.  After her echocardiogram, patient decided to travel to [place] where she developed COVID and pneumonia and was admitted to the hospital for 5 days.  Reportedly, troponin was minimally elevated at the hospital.  She saw Dr. Bing Matter on 01/08/2023.  At that time, patient reported feeling well.  Her shortness of breath improved since she had COVID.  Patient was referred to the advanced heart failure clinic but has not yet been seen.  It was also noted that patient would likely require ischemic workup   Patient presented to the ED on 10/10 complaining of shortness of breath that had been on going since early September. Initial vital signs showed HR 98 BPM, oxygen 98% on room air, BP 106/75. EKG showed sinus rhythm, LVH, diffuse Q waves. CXR showed cardiomegaly and pulmonary vascular congestion. Labwork significant for hsTn [phone]. BNP 3,090. Creatinine 1.25. Patient was transferred to [facility] on the cardiology service for treatment of elevated troponin, CHF.   On interview, patient reports that she had breast cancer in 200.  Underwent lumpectomy, chemotherapy, and radiation. Was told that her chemotherapy caused her heart failure. Overall, she had been fairly well compensated from a heart failure  standpoint for several years. She recently went on a vacation to [place] with her husband. While there, she developed shortness of breath and had some chest pain. She was taken to the hospital and was diagnosed with COVID and pneumonia. Was treated with antibiotics and antivirals. Since coming home from her trip, she has been trying to see all of her doctors. She had labs drawn at her PCP and was told that her kidney function was slightly worse than normal. Also saw her lung doctor who started her on prednisone. She had labs drawn which showed elevated BNP and she was told to go to the ED for evaluation. Patient reports that she has overall felt well. Denies cough, shortness of breath on exertion. Does have some orthopnea. No ankle edema or chest pain. She had been taking a nap a few days ago when she woke up with pain in the back of her neck that spread down her shoulders. Felt sweaty at the time. Her husband checked her vital signs which were normal.   In the ED, patient received one dose of lasix. Reports good urine output. She currently feels well without chest pain, shortness of breath.   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   Dilated cardiomyopathy (HCC) 04/10/2018   Dyspepsia 01/18/2017   Dysplastic nevus 12/02/2016   Glaucoma 10/14/2017   Hiatal hernia 07/20/2019   High cholesterol    History of breast cancer 04/23/2020   Hyperlipidemia 04/18/2014   Hypertension    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 Pt was given copy of recent ECHO report.  Last Assessment & Plan:  Pt will f/u w/ Cardiology re this. Pt was given copy of recent ECHO report.    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 Added automatically from request for surgery [id number] Formatting of this note might be different from the original. Added automatically from request for surgery [id number]   Osteopenia of hip 04/21/2020   Formatting of this note might be different from the original. DEXA 06/2017: Lumbar T score -1.4, right total femur -1.4. She restarted alendronate since 05/2019. Formatting of this note might be different from the original. DEXA 06/2017: Lumbar T score -1.4, right total femur -1.4. She restarted alendronate since 05/2019.   Personal history of radiation therapy 2001   Left Breast Cancer   Reactive airway disease without complication 10/14/2017   S/P total knee arthroplasty, 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   Seasonal allergies 02/26/2014   Last Assessment & Plan: Formatting of this note  might be different from the original. Start using Flonase daily. Stop using OTC Afrin Formatting of this note might be different from the original. Last Assessment & Plan: Start using Flonase daily. Stop using OTC Afrin Formatting of this note might be different from the original. Overview: Last Assessment & Plan: Start using Flonase daily. Stop usin   Stage 3a chronic kidney disease (HCC) 04/01/2021   Trigger finger of left hand 07/26/2018    Past Surgical History:  Procedure Laterality Date   BREAST LUMPECTOMY Left 2001   CESAREAN SECTION     ROTATOR  CUFF REPAIR Bilateral    SHOULDER CLOSED REDUCTION       Medications Prior to Admission: Prior to Admission medications   Medication Sig Start Date End Date Taking? Authorizing Provider  alendronate (FOSAMAX) 35 MG tablet Take 35 mg by mouth once a week. 01/08/23  Yes [provider]  predniSONE (DELTASONE) 10 MG tablet Take 10 mg by mouth 2 (two) times daily. Take for 7 days. 01/19/23  Yes [provider]  simvastatin (ZOCOR) 20 MG tablet Take 20 mg by mouth at bedtime. 01/11/23  Yes [provider]  albuterol (PROVENTIL HFA;VENTOLIN HFA) 108 (90 Base) MCG/ACT inhaler Inhale 2 puffs into the lungs every 6 (six) hours as needed for wheezing or shortness of breath. 01/01/16   Chilton Greathouse, MD  aspirin EC (ASPIRIN 81) 81 MG tablet Take 1 tablet (81 mg total) by mouth every morning. 11/06/21   Georgeanna Lea, MD  brinzolamide (AZOPT) 1 % ophthalmic suspension Place 1 drop into both eyes 2 (two) times daily.    [provider]  Calcium Carbonate-Vitamin D (CALCIUM + D PO) Take 1 tablet by mouth 2 (two) times daily.     [provider]  Cholecalciferol 25 MCG (1000 UT) tablet Take 1,000 Units by mouth daily.     [provider]  ferrous sulfate 325 (65 FE) MG tablet Take 325 mg by mouth daily.    [provider]  fluticasone (FLONASE) 50 MCG/ACT nasal spray Place 2 sprays into both nostrils daily. 01/01/16   Mannam, Colbert Coyer, MD  levothyroxine (SYNTHROID, LEVOTHROID) 100 MCG tablet Take 100 mcg by mouth daily. 07/20/16   [provider]  Loratadine 10 MG CAPS Take 10 mg by mouth daily.    [provider]  metoprolol succinate (TOPROL-XL) 50 MG 24 hr tablet Take 1 tablet (50 mg total) by mouth daily. 10/26/22   Georgeanna Lea, MD  Multiple Vitamins-Minerals (CENTRUM SILVER PO) Take 1 tablet by mouth every morning.    [provider]  psyllium (METAMUCIL) 58.6 % packet Take 1 packet by mouth daily as needed  (constipation).    [provider]  torsemide (DEMADEX) 20 MG tablet Take 1 tablet (20 mg total) by mouth 2 (two) times daily. 01/13/23   Revankar, Aundra Dubin, MD  Travoprost, BAK Free, (TRAVATAN) 0.004 % SOLN ophthalmic solution Place 1 drop into both eyes at bedtime.    [provider]  valsartan (DIOVAN) 40 MG tablet Take 1 tablet (40 mg total) by mouth daily. 08/17/22   Flossie Dibble, NP  vitamin B-12 (CYANOCOBALAMIN) 1000 MCG tablet Take 1,000 mcg by mouth daily.    [provider]  vitamin C (ASCORBIC ACID) 500 MG tablet Take 500 mg by mouth daily.    [provider]     Allergies:    Allergies  Allergen Reactions   Penicillins Itching and Other (See Comments)    Vulvovaginal  Candidiasis with associated pruritis  Vaginal Infections  Vaginal Infections    Vulvovaginal Candidiasis with associated pruritis    Vulvovaginal Candidiasis with associated pruritis Vaginal Infections Vaginal Infections   Sulfamethoxazole-Trimethoprim Other (See Comments)    Makes her shaky and sweat   Tramadol Other (See Comments)    Makes her shaky and sweat    Social History:   Social History   Socioeconomic History   Marital status: Married    Spouse name: Not on file   Number of children: Not on file   Years of education: Not on file   Highest education level: Not on file  Occupational History   Not on file  Tobacco Use   Smoking status: Never    Passive exposure: Never   Smokeless tobacco: Never   Tobacco comments:    NEVER USED TOBACCO  Vaping Use   Vaping status: Never Used  Substance and Sexual Activity   Alcohol use: Yes    Alcohol/week: 0.0 standard drinks of alcohol    Comment: occ   Drug use: No   Sexual activity: Not on file  Other Topics Concern   Not on file  Social History Narrative   Not on file   Social Determinants of Health   Financial Resource Strain: Not on file  Food Insecurity: Low Risk  (01/19/2023)   Received from  [facility]   Hunger Vital Sign    Worried About Running Out of Food in the Last Year: Never true    Ran Out of Food in the Last Year: Never true  Transportation Needs: No Transportation Needs (01/19/2023)   Received from [facility]    In the past 12 months, has lack of reliable transportation kept you from medical appointments, meetings, work or from getting things needed for daily living? : No  Physical Activity: Not on file  Stress: Not on file  Social Connections: Unknown (06/27/2022)   Received from [facility]   Social Network    Social Network: Not on file  Intimate Partner Violence: Unknown (06/27/2022)   Received from [facility], [facility]   HITS    Physically Hurt: Not on file    Insult or Talk Down To: Not on file    Threaten Physical Harm: Not on file    Scream or Curse: Not on file    Family History:   The patient's family history includes Hypertension in her mother. There is no history of Heart attack, Stroke, or Diabetes.    ROS:  Please see the history of present illness.  All other ROS reviewed and negative.     Physical Exam/Data:   Vitals:   01/22/23 1010 01/22/23 1237 01/22/23 1252 01/22/23 1303  BP:   110/80   Pulse:      Resp:   19   Temp: 98 F (36.7 C)  (!) 97.5 F (36.4 C)   TempSrc: Oral  Oral   SpO2:   98% 100%  Weight:  56.2 kg    Height:  4\' 11"  (1.499 m)      Intake/Output Summary (Last 24 hours) at 01/22/2023 1421 Last data filed at 01/22/2023 1258 Gross per 24 hour  Intake 65.54 ml  Output 2200 ml  Net -2134.46 ml      01/22/2023   12:37 PM 01/21/2023    6:51 PM 01/08/2023    3:51 PM  Last 3 Weights  Weight (lbs) 123 lb 14.4 oz 123 lb 7.3 oz 122 lb  9.6 oz  Weight (kg) 56.2 kg 56 kg 55.611 kg     Body mass index is 25.02 kg/m.  General:  Well nourished, well developed, in no acute distress. Sitting comfortably on the side of the bed  HEENT: normal Neck: no JVD Vascular: Radial  pulses 2+ bilaterally   Cardiac:  normal S1, S2; RRR; no murmur  Lungs:  clear to auscultation bilaterally, no wheezing, rhonchi or rales. Normal work of breathing on room air  Abd: soft, nontender Ext: trace edema in BLE  Musculoskeletal:  No deformities, BUE and BLE strength normal and equal Skin: warm and dry  Neuro:  CNs 2-12 intact, no focal abnormalities noted Psych:  Normal affect    EKG:  The ECG that was done 10/10 was personally reviewed and demonstrates sinus rhythm, HR 98 BPM, Q waves in the inferior leads, anterior leads, LVH   Relevant CV Studies:   Laboratory Data:  High Sensitivity Troponin:   Recent Labs  Lab 01/21/23 1948 01/22/23 0136  TROPONINIHS 11,645* 8,773*      Chemistry Recent Labs  Lab 01/18/23 1326 01/21/23 1948  NA 141 136  K 4.3 3.5  CL 100 95*  CO2 27 26  GLUCOSE 97 143*  BUN 25 45*  CREATININE 1.12* 1.25*  CALCIUM 9.6 9.1  GFRNONAA  --  44*  ANIONGAP  --  15    No results for input(s): "PROT", "ALBUMIN", "AST", "ALT", "ALKPHOS", "BILITOT" in the last 168 hours. Lipids No results for input(s): "CHOL", "TRIG", "HDL", "LABVLDL", "LDLCALC", "CHOLHDL" in the last 168 hours. Hematology Recent Labs  Lab 01/21/23 1948 01/22/23 1324  WBC 9.6 10.8*  RBC 4.48 5.03  HGB 11.9* 13.6  HCT 37.4 42.4  MCV 83.5 84.3  MCH 26.6 27.0  MCHC 31.8 32.1  RDW 16.6* 16.9*  PLT 282 328   Thyroid No results for input(s): "TSH", "FREET4" in the last 168 hours. BNP Recent Labs  Lab 01/18/23 1326 01/21/23 1948  BNP  --  3,090.9*  PROBNP 9,910*  --     DDimer No results for input(s): "DDIMER" in the last 168 hours.   Radiology/Studies:  DG Chest Port 1 View  Result Date: 01/21/2023 CLINICAL DATA:  Shortness of breath EXAM: PORTABLE CHEST 1 VIEW COMPARISON:  Radiograph 01/05/2023 FINDINGS: Cardiomegaly. Aortic atherosclerotic calcification. Pulmonary vascular congestion. No focal consolidation, pleural effusion, or pneumothorax. Moderate hiatal  hernia. IMPRESSION: Cardiomegaly and pulmonary vascular congestion. Electronically Signed   By: Minerva Fester M.D.   On: 01/21/2023 20:43     Assessment and Plan:   Acute on Chronic Systolic Heart Failure - Patient has had EF around 20-30% since at least 2017. Most recent echo from 10/2022 showed EF 30-35%, normal RV function, moderate MR - BNP elevated to 3090. CXR with pulmonary vascular congestion  - In the past, GDMT has been limited by BP. PTA was on valsartan and metoprolol - Stop metoprolol, valsartan on admission  - Start aldactone 12.5 mg daily, losartan 12.5 mg daily - If BP allows, can try to add BB and transition to entresto later this admission. Add SGLT2i after cath  - Continues to be mildly volume overloaded on exam- continue IV lasix 40 mg BID today  - Ordered repeat echo  - Plan for R/L heart catheterization Monday   NSTEMI  - hsTn 96,045>4098. Patient denies chest pain, shortness of breath. Currently comfortable  - Continue IV heparin  - Continue ASA 81 mg daily  - Was on simvastatin 20 mg daily PTA-  transition to lipitor 40 mg daily  - R/L heart cath Monday as above   Hypothyroidism  - Continue home levothyroxine 100 mcg daily      Risk Assessment/Risk Scores:    TIMI Risk Score for Unstable Angina or Non-ST Elevation MI:   The patient's TIMI risk score is 3, which indicates a 13% risk of all cause mortality, new or recurrent myocardial infarction or need for urgent revascularization in the next 14 days.  New York Heart Association (NYHA) Functional Class NYHA Class IV    Code Status: Full Code  Severity of Illness: The appropriate patient status for this patient is INPATIENT. Inpatient status is judged to be reasonable and necessary in order to provide the required intensity of service to ensure the patient's safety. The patient's presenting symptoms, physical exam findings, and initial radiographic and laboratory data in the context of their chronic  comorbidities is felt to place them at high risk for further clinical deterioration. Furthermore, it is not anticipated that the patient will be medically stable for discharge from the hospital within 2 midnights of admission.   * I certify that at the point of admission it is my clinical judgment that the patient will require inpatient hospital care spanning beyond 2 midnights from the point of admission due to high intensity of service, high risk for further deterioration and high frequency of surveillance required.*   For questions or updates, please contact [facility] Please consult [email or web address] for contact info under     Signed, Jonita Albee, PA-C  01/22/2023 2:21 PM

## 2023-01-22 NOTE — ED Notes (Signed)
Pt adjusted in bed, sts feels better sitting up. Denies further needs.

## 2023-01-22 NOTE — TOC Benefit Eligibility Note (Signed)
Patient Product/process development scientist completed.    The patient is insured through [employer]. Patient has Medicare and is not eligible for a copay card, but may be able to apply for patient assistance, if available.    Ran test claim for Entresto 24-26 mg and the current 30 day co-pay is $45.00.  Ran test claim for Jardiance 10 mg and the current 30 day co-pay is $45.00.  Ran test claim for Farxiga 10 mg and the current 30 day co-pay is $95.00.   This test claim was processed through [facility]- copay amounts may vary at other pharmacies due to pharmacy/plan contracts, or as the patient moves through the different stages of their insurance plan.     Roland Earl, CPHT Pharmacy Technician III Certified Patient Advocate [facility] Patient Advocate Team Direct Number: [phone]  Fax: [phone]

## 2023-01-23 ENCOUNTER — Inpatient Hospital Stay (HOSPITAL_COMMUNITY): Payer: Medicare HMO

## 2023-01-23 DIAGNOSIS — I5023 Acute on chronic systolic (congestive) heart failure: Secondary | ICD-10-CM

## 2023-01-23 DIAGNOSIS — I5043 Acute on chronic combined systolic (congestive) and diastolic (congestive) heart failure: Secondary | ICD-10-CM

## 2023-01-23 LAB — BASIC METABOLIC PANEL
Anion gap: 9 (ref 5–15)
BUN: 29 mg/dL — ABNORMAL HIGH (ref 8–23)
CO2: 30 mmol/L (ref 22–32)
Calcium: 9.2 mg/dL (ref 8.9–10.3)
Chloride: 97 mmol/L — ABNORMAL LOW (ref 98–111)
Creatinine, Ser: 1.61 mg/dL — ABNORMAL HIGH (ref 0.44–1.00)
GFR, Estimated: 32 mL/min — ABNORMAL LOW (ref >60)
Glucose, Bld: 99 mg/dL (ref 70–99)
Potassium: 4.5 mmol/L (ref 3.5–5.1)
Sodium: 136 mmol/L (ref 135–145)

## 2023-01-23 LAB — ECHOCARDIOGRAM COMPLETE
AR max vel: 1.54 cm2
AV Area VTI: 1.35 cm2
AV Area mean vel: 1.34 cm2
AV Mean grad: 3 mm[Hg]
AV Peak grad: 4.7 mm[Hg]
Ao pk vel: 1.08 m/s
Area-P 1/2: 6.83 cm2
Height: 59 in
MV M vel: 4.31 m/s
MV Peak grad: 74.3 mm[Hg]
Radius: 0.5 cm
S' Lateral: 5.3 cm
Weight: 1985.9 [oz_av]

## 2023-01-23 LAB — CBC
HCT: 35.5 % — ABNORMAL LOW (ref 36.0–46.0)
Hemoglobin: 11.5 g/dL — ABNORMAL LOW (ref 12.0–15.0)
MCH: 27.4 pg (ref 26.0–34.0)
MCHC: 32.4 g/dL (ref 30.0–36.0)
MCV: 84.5 fL (ref 80.0–100.0)
Platelets: 219 10*3/uL (ref 150–400)
RBC: 4.2 MIL/uL (ref 3.87–5.11)
RDW: 16.5 % — ABNORMAL HIGH (ref 11.5–15.5)
WBC: 8 10*3/uL (ref 4.0–10.5)
nRBC: 0 % (ref 0.0–0.2)

## 2023-01-23 LAB — LIPID PANEL
Cholesterol: 129 mg/dL (ref 0–200)
HDL: 57 mg/dL (ref >40)
LDL Cholesterol: 58 mg/dL (ref 0–99)
Total CHOL/HDL Ratio: 2.3 {ratio}
Triglycerides: 69 mg/dL (ref <150)
VLDL: 14 mg/dL (ref 0–40)

## 2023-01-23 LAB — HEMOGLOBIN A1C
Hgb A1c MFr Bld: 5.8 % — ABNORMAL HIGH (ref 4.8–5.6)
Mean Plasma Glucose: 119.76 mg/dL

## 2023-01-23 LAB — HEPARIN LEVEL (UNFRACTIONATED)
Heparin Unfractionated: 0.58 [IU]/mL (ref 0.30–0.70)
Heparin Unfractionated: 0.41 [IU]/mL (ref 0.30–0.70)

## 2023-01-23 MED ORDER — FUROSEMIDE 10 MG/ML IJ SOLN
40.0000 mg | Freq: Once | INTRAMUSCULAR | Status: AC
  Administered 2023-01-23: 40 mg via INTRAVENOUS
  Filled 2023-01-23: qty 4

## 2023-01-23 MED ORDER — PERFLUTREN LIPID MICROSPHERE
1.0000 mL | INTRAVENOUS | Status: AC | PRN
  Administered 2023-01-23: 3 mL via INTRAVENOUS
  Filled 2023-01-23: qty 10

## 2023-01-23 NOTE — Progress Notes (Signed)
Patient Name: Nichole Johnson Date of Encounter: 01/23/2023 [facility] Cardiologist: Norman Herrlich, MD   Interval Summary  .    Long discussion with patient and husband regarding critical juncture in the management of her heart failure.  We reviewed cardiac MRI which had a transmural delayed enhancement of the anterior wall from mid to apex of unknown etiology but suggestive of infarct.  I had personally read this MRI.  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  This has been discussed with Dr. Gala Romney in 2021 who recommended right heart catheterization and per patient report left heart catheterization which was not pursued per patient preference.  The concern I have is significant symptoms while on vacation last month and again this admission with troponin elevation peaking at 11,645 and now downtrending.  Several factors point towards ischemic heart disease as a contributor if not the primary issue though Adriamycin cardiotoxicity has been postulated as the primary mechanism.  ECV on her MRI was within the normal range.  After extended discussion we will consider left heart catheterization on Monday with the addition of a right heart catheterization if patient and her husband are agreeable we will discuss again tomorrow.  Vital Signs .    Vitals:   01/23/23 0028 01/23/23 0400 01/23/23 0600 01/23/23 1018  BP: (!) 90/59 (!) 85/64 (!) 80/57 98/74  Pulse: 81 76 69   Resp: 20 17 17    Temp: 97.8 F (36.6 C) 98.4 F (36.9 C)    TempSrc: Oral Oral    SpO2: 100% 100%    Weight:  56.3 kg    Height:        Intake/Output Summary (Last 24 hours) at 01/23/2023 1109 Last data filed at 01/23/2023 8416 Gross per 24 hour  Intake 618.3 ml  Output 1125 ml  Net -506.7 ml      01/23/2023    4:00 AM 01/22/2023   12:37 PM 01/21/2023    6:51 PM  Last 3 Weights  Weight (lbs) 124 lb 1.9 oz 123 lb 14.4 oz  123 lb 7.3 oz  Weight (kg) 56.3 kg 56.2 kg 56 kg      Telemetry/ECG    EKG SR LVH with incomplete left bundle pattern Telemetry: Sinus rhythm with 10-15 beat run of NSVT and occasional PVCs - Personally Reviewed  Physical Exam .   GEN: No acute distress.   Neck: JVP to earlobe sitting upright at 90 degrees Cardiac: RRR, no murmurs, rubs, or gallops.  Respiratory: Clear to auscultation bilaterally. GI: Soft, nontender, non-distended  MS: No edema, cool extremities  Assessment & Plan .     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 -Hold GDMT -Will attempt to give 1 additional dose of Lasix 40 mg IV today given JVP to her earlobe sitting upright at 90 degrees   # Non-STEMI -Patient tells me about an episode of significant chest discomfort while vacationing in [place], and approximately a month later just prior to this hospitalization had significant upper back pain without chest pain.  Does also have shortness of breath.  Troponin is elevated to 11,000.  MRI suggestive of a possible prior anterior infarct (mid to apical, possibly diagonal).  At this point I think the next logical step  is an ischemic evaluation.  We discussed that noninvasive testing is unlikely to be diagnostic with her elevated heart rate and hypotension so coronary CT would be unlikely to be able to be performed successfully, and vasodilator stress would also be challenging to perform with baseline hypotension.  Defer to patient's decision and we participated in shared decision making, will consider left heart catheterization on Monday.  I would also recommend a right heart catheterization but her husband feels this would be too complicated and may choose not to proceed with right heart cath.  What is unusual is no  significant evidence of coronary calcifications on CT angio chest performed April 2024. -If the patient prefers a noninvasive strategy and medical management before deciding on cardiac catheterization, we will focus on symptom management with Lasix.  GDMT will be difficult to add with hypotension.   # CKD IIIa -stable. Close monitoring with diuresis.  Creatinine elevated today,However JVP suggestive of continued volume overload.  Patient and husband agreeable to an additional dose of Lasix today, she is feeling better.  We can consider stopping Lasix tomorrow and returning to an outpatient oral regimen if renal function does not change significantly tomorrow.  FEN -no IVF -code: full -diet: heart healthy -dvt ppx: heparin    For questions or updates, please contact [facility] Please consult [email or web address] for contact info under        Signed, Parke Poisson, MD

## 2023-01-23 NOTE — Progress Notes (Signed)
PHARMACY - ANTICOAGULATION CONSULT NOTE  Pharmacy Consult for heparin Indication: chest pain/ACS  Allergies  Allergen Reactions   Penicillins Itching and Other (See Comments)    Vulvovaginal Candidiasis with associated pruritis  Vaginal Infections  Vaginal Infections    Vulvovaginal Candidiasis with associated pruritis    Vulvovaginal Candidiasis with associated pruritis Vaginal Infections Vaginal Infections   Sulfamethoxazole-Trimethoprim Other (See Comments)    Makes her shaky and sweat   Tramadol Other (See Comments)    Makes her shaky and sweat    Patient Measurements: Height: 4\' 11"  (149.9 cm) Weight: 56.3 kg (124 lb 1.9 oz) IBW/kg (Calculated) : 43.2 Heparin Dosing Weight: 54.6 kg  Vital Signs: Temp: 98.4 F (36.9 C) (10/12 1300) Temp Source: Oral (10/12 1300) BP: 110/71 (10/12 1300) Pulse Rate: 97 (10/12 1300)  Labs: Recent Labs    01/21/23 1948 01/22/23 0136 01/22/23 0506 01/22/23 1324 01/22/23 2304 01/23/23 0631 01/23/23 0829 01/23/23 1755  HGB 11.9*  --   --  13.6  --  11.5*  --   --   HCT 37.4  --   --  42.4  --  35.5*  --   --   PLT 282  --   --  328  --  219  --   --   HEPARINUNFRC  --   --    < > 0.16* 0.28*  --  0.58 0.41  CREATININE 1.25*  --   --   --   --  1.61*  --   --   TROPONINIHS 11,645* 8,773*  --   --   --   --   --   --    < > = values in this interval not displayed.    Estimated Creatinine Clearance: 21.6 mL/min (A) (by C-G formula based on SCr of 1.61 mg/dL (H)).   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    Dilated cardiomyopathy (HCC) 04/10/2018   Dyspepsia 01/18/2017   Dysplastic nevus 12/02/2016   Glaucoma 10/14/2017   Hiatal hernia 07/20/2019   High cholesterol    History of breast cancer 04/23/2020   Hyperlipidemia 04/18/2014   Hypertension    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 Pt was given copy of recent ECHO report.  Last Assessment & Plan:  Pt will f/u w/ Cardiology re this. Pt was given copy of recent ECHO report.    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 Added automatically from request for surgery [id number] Formatting of this note might be different from the original. Added automatically from request for surgery [id number]   Osteopenia of hip 04/21/2020   Formatting of this note might  be different from the original. DEXA 06/2017: Lumbar T score -1.4, right total femur -1.4. She restarted alendronate since 05/2019. Formatting of this note might be different from the original. DEXA 06/2017: Lumbar T score -1.4, right total femur -1.4. She restarted alendronate since 05/2019.   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 Start using Flonase daily. Stop using OTC Afrin Formatting of this note might be different from the original. Last Assessment & Plan: Start using Flonase  daily. Stop using OTC Afrin Formatting of this note might be different from the original. Overview: Last Assessment & Plan: Start using Flonase daily. Stop usin   Stage 3a chronic kidney disease (HCC) 04/01/2021   Trigger finger of left hand 07/26/2018     Assessment: 47 YOF with a PMH asthma, hypertension, cardiomyopathy, CHF, CKD presented to the ED with SOB and elevated troponin. Patient is not on anticoagulation PTA. Pharmacy consulted to dose heparin. Troponin 09811, elevated  Heparin level remains therapeutic on 1000 units/hr. No issues with infusion or signs of bleeding. Hgb down to 11.5. PLTc WNL.  Goal of Therapy:  Heparin level 0.3-0.7 units/ml Monitor platelets by anticoagulation protocol: Yes   Plan:  Continue heparin infusion 1000 units/hr Check daily heparin level and CBC Tentative plan for [facility] Monday   [facility], Pharm.D., BCPS Clinical Pharmacist  **Pharmacist phone directory can be found on [email or web address] listed under [facility].  01/23/2023 7:18 PM

## 2023-01-23 NOTE — Plan of Care (Signed)
Problem: Education: Goal: Knowledge of General Education information will improve Description: Including pain rating scale, medication(s)/side effects and non-pharmacologic comfort measures Outcome: Progressing   Problem: Health Behavior/Discharge Planning: Goal: Ability to manage health-related needs will improve Outcome: Progressing   Problem: Clinical Measurements: Goal: Ability to maintain clinical measurements within normal limits will improve Outcome: Progressing Goal: Will remain free from infection Outcome: Progressing Goal: Diagnostic test results will improve Outcome: Progressing Goal: Respiratory complications will improve Outcome: Progressing Goal: Cardiovascular complication will be avoided Outcome: Progressing   Problem: Activity: Goal: Risk for activity intolerance will decrease Outcome: Progressing   Problem: Nutrition: Goal: Adequate nutrition will be maintained Outcome: Progressing   Problem: Coping: Goal: Level of anxiety will decrease Outcome: Progressing   Problem: Elimination: Goal: Will not experience complications related to bowel motility Outcome: Progressing Goal: Will not experience complications related to urinary retention Outcome: Progressing   Problem: Pain Managment: Goal: General experience of comfort will improve Outcome: Progressing   Problem: Safety: Goal: Ability to remain free from injury will improve Outcome: Progressing   Problem: Skin Integrity: Goal: Risk for impaired skin integrity will decrease Outcome: Progressing   Problem: Education: Goal: Understanding of cardiac disease, CV risk reduction, and recovery process will improve Outcome: Progressing Goal: Individualized Educational Video(s) Outcome: Progressing   Problem: Activity: Goal: Ability to tolerate increased activity will improve Outcome: Progressing   Problem: Cardiac: Goal: Ability to achieve and maintain adequate cardiovascular perfusion will  improve Outcome: Progressing   Problem: Health Behavior/Discharge Planning: Goal: Ability to safely manage health-related needs after discharge will improve Outcome: Progressing

## 2023-01-23 NOTE — Progress Notes (Signed)
PHARMACY - ANTICOAGULATION CONSULT NOTE  Pharmacy Consult for heparin Indication: chest pain/ACS  Allergies  Allergen Reactions   Penicillins Itching and Other (See Comments)    Vulvovaginal Candidiasis with associated pruritis  Vaginal Infections  Vaginal Infections    Vulvovaginal Candidiasis with associated pruritis    Vulvovaginal Candidiasis with associated pruritis Vaginal Infections Vaginal Infections   Sulfamethoxazole-Trimethoprim Other (See Comments)    Makes her shaky and sweat   Tramadol Other (See Comments)    Makes her shaky and sweat    Patient Measurements: Height: 4\' 11"  (149.9 cm) Weight: 56.3 kg (124 lb 1.9 oz) IBW/kg (Calculated) : 43.2 Heparin Dosing Weight: 54.6 kg  Vital Signs: Temp: 98.4 F (36.9 C) (10/12 0400) Temp Source: Oral (10/12 0400) BP: 85/64 (10/12 0400) Pulse Rate: 76 (10/12 0400)  Labs: Recent Labs    01/21/23 1948 01/22/23 0136 01/22/23 0506 01/22/23 1324 01/22/23 2304 01/23/23 0631 01/23/23 0829  HGB 11.9*  --   --  13.6  --  11.5*  --   HCT 37.4  --   --  42.4  --  35.5*  --   PLT 282  --   --  328  --  219  --   HEPARINUNFRC  --   --    < > 0.16* 0.28*  --  0.58  CREATININE 1.25*  --   --   --   --  1.61*  --   TROPONINIHS 11,645* 8,773*  --   --   --   --   --    < > = values in this interval not displayed.    Estimated Creatinine Clearance: 21.6 mL/min (A) (by C-G formula based on SCr of 1.61 mg/dL (H)).   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   Dilated cardiomyopathy (HCC) 04/10/2018    Dyspepsia 01/18/2017   Dysplastic nevus 12/02/2016   Glaucoma 10/14/2017   Hiatal hernia 07/20/2019   High cholesterol    History of breast cancer 04/23/2020   Hyperlipidemia 04/18/2014   Hypertension    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 Pt was given copy of recent ECHO report.  Last Assessment & Plan:  Pt will f/u w/ Cardiology re this. Pt was given copy of recent ECHO report.    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 Added automatically from request for surgery [id number] Formatting of this note might be different from the original. Added automatically from request for surgery [id number]   Osteopenia of hip 04/21/2020   Formatting of this note might be different from the original. DEXA 06/2017: Lumbar T score -1.4, right total femur -1.4. She restarted alendronate  since 05/2019. Formatting of this note might be different from the original. DEXA 06/2017: Lumbar T score -1.4, right total femur -1.4. She restarted alendronate since 05/2019.   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 Start using Flonase daily. Stop using OTC Afrin Formatting of this note might be different from the original. Last Assessment & Plan: Start using Flonase daily. Stop using OTC Afrin Formatting of this  note might be different from the original. Overview: Last Assessment & Plan: Start using Flonase daily. Stop usin   Stage 3a chronic kidney disease (HCC) 04/01/2021   Trigger finger of left hand 07/26/2018     Assessment: 77YOF with a PMH asthma, hypertension, cardiomyopathy, CHF, CKD presented to the ED with SOB and elevated troponin. Patient is not on anticoagulation PTA. Pharmacy consulted to dose heparin. Troponin 40102, elevated  Heparin level (0.58) is therapeutic on 1000 units/hr. No issues with infusion or signs of bleeding overnight. Hgb down to 11.5. PLTc WNL.  Goal of Therapy:  Heparin level 0.3-0.7 units/ml Monitor platelets by anticoagulation protocol: Yes   Plan:  Continue heparin infusion 1000 units/hr Check heparin level in 8 hours and daily  Continue to monitor CBC and signs/symptoms of bleeding Tentative plan for [facility] Monday  Verdene Rio, PharmD PGY1 Pharmacy Resident

## 2023-01-24 DIAGNOSIS — I5043 Acute on chronic combined systolic (congestive) and diastolic (congestive) heart failure: Secondary | ICD-10-CM | POA: Diagnosis not present

## 2023-01-24 LAB — BASIC METABOLIC PANEL
Anion gap: 13 (ref 5–15)
BUN: 35 mg/dL — ABNORMAL HIGH (ref 8–23)
CO2: 26 mmol/L (ref 22–32)
Calcium: 8.7 mg/dL — ABNORMAL LOW (ref 8.9–10.3)
Chloride: 98 mmol/L (ref 98–111)
Creatinine, Ser: 1.26 mg/dL — ABNORMAL HIGH (ref 0.44–1.00)
GFR, Estimated: 43 mL/min — ABNORMAL LOW (ref >60)
Glucose, Bld: 97 mg/dL (ref 70–99)
Potassium: 3.4 mmol/L — ABNORMAL LOW (ref 3.5–5.1)
Sodium: 137 mmol/L (ref 135–145)

## 2023-01-24 LAB — CBC
HCT: 35.5 % — ABNORMAL LOW (ref 36.0–46.0)
Hemoglobin: 11.6 g/dL — ABNORMAL LOW (ref 12.0–15.0)
MCH: 27.5 pg (ref 26.0–34.0)
MCHC: 32.7 g/dL (ref 30.0–36.0)
MCV: 84.1 fL (ref 80.0–100.0)
Platelets: 245 10*3/uL (ref 150–400)
RBC: 4.22 MIL/uL (ref 3.87–5.11)
RDW: 16.6 % — ABNORMAL HIGH (ref 11.5–15.5)
WBC: 7.5 10*3/uL (ref 4.0–10.5)
nRBC: 0 % (ref 0.0–0.2)

## 2023-01-24 LAB — HEPARIN LEVEL (UNFRACTIONATED): Heparin Unfractionated: 0.48 [IU]/mL (ref 0.30–0.70)

## 2023-01-24 LAB — LIPOPROTEIN A (LPA): Lipoprotein (a): 82.6 nmol/L — ABNORMAL HIGH (ref <75.0)

## 2023-01-24 MED ORDER — METOPROLOL SUCCINATE ER 25 MG PO TB24
25.0000 mg | ORAL_TABLET | Freq: Every day | ORAL | Status: DC
  Administered 2023-01-24 – 2023-01-26 (×3): 25 mg via ORAL
  Filled 2023-01-24 (×3): qty 1

## 2023-01-24 NOTE — Progress Notes (Addendum)
Patient Name: Nichole Johnson Date of Encounter: 01/24/2023 [facility] Cardiologist: Norman Herrlich, MD   Interval Summary  .    01/24/23: Discussed patient's care with husband by phone in the room and patient.  They remain undecided about cardiac catheterization.  Patient is in favor of proceeding but husband feels he needs an additional day of second opinions to make a determination.  Please see discussion from yesterday below.  Her echocardiogram demonstrates an ejection fraction of 20%, stroke-volume of 22, and calculated cardiac output of 2.1 by echo, cardiac index therefore 1.4 by echo calculations.  She remains warm on exam, mildly tachycardic without her beta-blocker, on room air and responded well to 1 dose of Lasix yesterday.  To facilitate cardiac catheterization, will not dose Lasix again today and hope that renal function continues to improve.  I have recommended right and left heart catheterization to the patient and her husband, her husband feels that right heart catheterization would overly complicate left heart catheterization procedure and should not be performed, consent will need to be confirmed for both procedures prior to moving forward.  I have indicated that hemodynamic information would be extremely helpful in continued management of her advanced heart failure.  At this point involvement of advanced heart failure service may also be beneficial.  I have informed them that Dr. Antoine Poche will be taking over the service tomorrow, and a detailed discussion can be had with him.  01/23/23: We reviewed cardiac MRI which had a transmural delayed enhancement of the anterior wall from mid to apex of unknown etiology but suggestive of infarct.  I had personally read this MRI.  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  This has been discussed with Dr. Gala Romney in 2021 who recommended right heart  catheterization and per patient report left heart catheterization which was not pursued per patient preference.  The concern I have is significant symptoms while on vacation last month and again this admission with troponin elevation peaking at 11,645 and now downtrending.  Several factors point towards ischemic heart disease as a contributor if not the primary issue though Adriamycin cardiotoxicity has been postulated as the primary mechanism.  ECV on her MRI was within the normal range.  After extended discussion we will consider left heart catheterization on Monday with the addition of a right heart catheterization if patient and her husband are agreeable we will discuss again tomorrow.  Vital Signs .    Vitals:   01/23/23 2353 01/24/23 0323 01/24/23 0446 01/24/23 0743  BP:  111/79  106/74  Pulse: 94 96  94  Resp:  17  18  Temp: (!) 97.5 F (36.4 C) 97.6 F (36.4 C)  98.4 F (36.9 C)  TempSrc: Oral Oral  Oral  SpO2: 98% 98%  99%  Weight:   55.6 kg   Height:        Intake/Output Summary (Last 24 hours) at 01/24/2023 1027 Last data filed at 01/24/2023 0900 Gross per 24 hour  Intake 718 ml  Output 1700 ml  Net -982 ml      01/24/2023    4:46 AM 01/23/2023    4:00 AM 01/22/2023   12:37 PM  Last 3 Weights  Weight (lbs) 122 lb 9.2 oz 124 lb 1.9 oz 123 lb 14.4 oz  Weight (kg) 55.6 kg 56.3 kg 56.2 kg      Telemetry/ECG    EKG SR LVH with incomplete left bundle pattern  Telemetry: Sinus tachycardia with PVCs - Personally Reviewed  Physical Exam .   GEN: No acute distress.   Neck: JVP to earlobe lying flat. Cardiac: RRR, no murmurs, rubs, or gallops.  Respiratory: Clear to auscultation bilaterally. GI: Soft, nontender, non-distended  MS: No edema, cool extremities  Assessment & Plan .     Principal Problem:   Heart failure (HCC) Active Problems:   Acute on chronic systolic (congestive) heart failure (HCC)  # Acute on Chronic CHF, EF 30-35%, now 20% -Low blood  pressure, clinically does not appear to be in shock but hypotension is concerning. -Hold GDMT but per patient's husband's preference, will trial adding back low-dose metoprolol, she is warm on exam and likely with better volume management, can attempt but if hypotensive would probably discontinue.   # Non-STEMI -Patient tells me about an episode of significant chest discomfort while vacationing in [place], and approximately a month later just prior to this hospitalization had significant upper back pain without chest pain.  Does also have shortness of breath.  Troponin is elevated to 11,000.  MRI suggestive of a possible prior anterior infarct (mid to apical, possibly diagonal).  At this point I think the next logical step is an ischemic evaluation.  We discussed that noninvasive testing is unlikely to be diagnostic with her elevated heart rate and hypotension so coronary CT would be unlikely to be able to be performed successfully, and vasodilator stress would also be challenging to perform with baseline hypotension.  Defer to patient's decision and we participated in shared decision making, will consider left heart catheterization on Monday.  I would also recommend a right heart catheterization but her husband feels this would be too complicated and may choose not to proceed with right heart cath.  What is unusual is no significant evidence of coronary calcifications on CT angio chest performed April 2024. -If the patient prefers a noninvasive strategy and medical management before deciding on cardiac catheterization, we will focus on symptom management with Lasix.  GDMT will be difficult to add with hypotension.   # CKD IIIa -Improved with diuresis. Close monitoring with diuresis.  Hold further diuresis to facilitate cardiac catheterization tomorrow.  FEN -no IVF -code: full -diet: heart healthy -dvt ppx: heparin    For questions or updates, please contact [facility] Please consult  [email or web address] for contact info under        Signed, Parke Poisson, MD

## 2023-01-24 NOTE — Progress Notes (Addendum)
PHARMACY - ANTICOAGULATION CONSULT NOTE  Pharmacy Consult for heparin Indication: chest pain/ACS  Allergies  Allergen Reactions   Penicillins Itching and Other (See Comments)    Vulvovaginal Candidiasis with associated pruritis  Vaginal Infections  Vaginal Infections    Vulvovaginal Candidiasis with associated pruritis    Vulvovaginal Candidiasis with associated pruritis Vaginal Infections Vaginal Infections   Sulfamethoxazole-Trimethoprim Other (See Comments)    Makes her shaky and sweat   Tramadol Other (See Comments)    Makes her shaky and sweat    Patient Measurements: Height: 4\' 11"  (149.9 cm) Weight: 55.6 kg (122 lb 9.2 oz) IBW/kg (Calculated) : 43.2 Heparin Dosing Weight: 54.6 kg  Vital Signs: Temp: 97.6 F (36.4 C) (10/13 0323) Temp Source: Oral (10/13 0323) BP: 111/79 (10/13 0323) Pulse Rate: 96 (10/13 0323)  Labs: Recent Labs    01/21/23 1948 01/22/23 0136 01/22/23 0506 01/22/23 1324 01/22/23 2304 01/23/23 0631 01/23/23 0829 01/23/23 1755 01/24/23 0352  HGB 11.9*  --   --  13.6  --  11.5*  --   --  11.6*  HCT 37.4  --   --  42.4  --  35.5*  --   --  35.5*  PLT 282  --   --  328  --  219  --   --  245  HEPARINUNFRC  --   --    < > 0.16*   < >  --  0.58 0.41 0.48  CREATININE 1.25*  --   --   --   --  1.61*  --   --  1.26*  TROPONINIHS 11,645* 8,773*  --   --   --   --   --   --   --    < > = values in this interval not displayed.    Estimated Creatinine Clearance: 27.5 mL/min (A) (by C-G formula based on SCr of 1.26 mg/dL (H)).   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   Dilated cardiomyopathy (HCC) 04/10/2018   Dyspepsia 01/18/2017   Dysplastic nevus 12/02/2016   Glaucoma 10/14/2017   Hiatal hernia 07/20/2019   High cholesterol    History of breast cancer 04/23/2020   Hyperlipidemia 04/18/2014   Hypertension    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 Pt was given copy of recent ECHO report.  Last Assessment & Plan:  Pt will f/u w/ Cardiology re this. Pt was given copy of recent ECHO report.    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 Added automatically from request for surgery [id number] Formatting of this note might be different from the original. Added automatically from request for surgery [id number]  Osteopenia of hip 04/21/2020   Formatting of this note might be different from the original. DEXA 06/2017: Lumbar T score -1.4, right total femur -1.4. She restarted alendronate since 05/2019. Formatting of this note might be different from the original. DEXA 06/2017: Lumbar T score -1.4, right total femur -1.4. She restarted alendronate since 05/2019.   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 Start using Flonase daily. Stop using OTC Afrin Formatting of this note might be different from the  original. Last Assessment & Plan: Start using Flonase daily. Stop using OTC Afrin Formatting of this note might be different from the original. Overview: Last Assessment & Plan: Start using Flonase daily. Stop usin   Stage 3a chronic kidney disease (HCC) 04/01/2021   Trigger finger of left hand 07/26/2018     Assessment: 41 YOF with a PMH asthma, hypertension, cardiomyopathy, CHF, CKD presented to the ED with SOB and elevated troponin. Patient is not on anticoagulation PTA. Pharmacy consulted to dose heparin. Troponin 40981, elevated  Heparin level remains therapeutic (0.48) on 1000 units/hr. No issues with infusion or signs of bleeding per RN. Hgb stable 11s. PLTc WNL.  Goal of Therapy:  Heparin level 0.3-0.7 units/ml Monitor platelets by anticoagulation protocol: Yes   Plan:  Continue heparin infusion 1000 units/hr Check daily heparin level and CBC Tentative plan for [facility] Monday   Verdene Rio, PharmD PGY1 Pharmacy Resident

## 2023-01-24 NOTE — Progress Notes (Signed)
Nurse informs me that pts husband would like to move forward with cath. I will make NPO at midnight to facilitate.  Formal consent will need to be obtained by rounding team tomorrow, and decision about LHC vs R/LHC.  Parke Poisson, MD

## 2023-01-24 NOTE — Plan of Care (Signed)
Problem: Nutrition: Goal: Adequate nutrition will be maintained Outcome: Completed/Met   Problem: Pain Managment: Goal: General experience of comfort will improve Outcome: Completed/Met

## 2023-01-24 NOTE — Plan of Care (Signed)
  Problem: Education: Goal: Knowledge of General Education information will improve Description: Including pain rating scale, medication(s)/side effects and non-pharmacologic comfort measures Outcome: Progressing   Problem: Health Behavior/Discharge Planning: Goal: Ability to manage health-related needs will improve Outcome: Progressing   Problem: Clinical Measurements: Goal: Ability to maintain clinical measurements within normal limits will improve Outcome: Progressing Goal: Will remain free from infection Outcome: Progressing Goal: Diagnostic test results will improve Outcome: Progressing Goal: Respiratory complications will improve Outcome: Progressing Goal: Cardiovascular complication will be avoided Outcome: Progressing   Problem: Activity: Goal: Risk for activity intolerance will decrease Outcome: Progressing   Problem: Coping: Goal: Level of anxiety will decrease Outcome: Progressing   Problem: Elimination: Goal: Will not experience complications related to bowel motility Outcome: Progressing Goal: Will not experience complications related to urinary retention Outcome: Progressing   Problem: Safety: Goal: Ability to remain free from injury will improve Outcome: Progressing   Problem: Skin Integrity: Goal: Risk for impaired skin integrity will decrease Outcome: Progressing   Problem: Education: Goal: Understanding of cardiac disease, CV risk reduction, and recovery process will improve Outcome: Progressing Goal: Individualized Educational Video(s) Outcome: Progressing   Problem: Activity: Goal: Ability to tolerate increased activity will improve Outcome: Progressing   Problem: Cardiac: Goal: Ability to achieve and maintain adequate cardiovascular perfusion will improve Outcome: Progressing   Problem: Health Behavior/Discharge Planning: Goal: Ability to safely manage health-related needs after discharge will improve Outcome: Progressing

## 2023-01-25 ENCOUNTER — Encounter (HOSPITAL_COMMUNITY)
Admission: EM | Disposition: A | Discharge status: Home / Self Care | Payer: Self-pay | Source: Ambulatory Visit | Attending: Cardiovascular Disease

## 2023-01-25 DIAGNOSIS — I2510 Atherosclerotic heart disease of native coronary artery without angina pectoris: Secondary | ICD-10-CM | POA: Diagnosis not present

## 2023-01-25 DIAGNOSIS — I5023 Acute on chronic systolic (congestive) heart failure: Secondary | ICD-10-CM

## 2023-01-25 HISTORY — PX: RIGHT/LEFT HEART CATH AND CORONARY ANGIOGRAPHY: CATH118266

## 2023-01-25 LAB — POCT I-STAT EG7
Acid-Base Excess: 1 mmol/L (ref 0.0–2.0)
Acid-Base Excess: 1 mmol/L (ref 0.0–2.0)
Bicarbonate: 25.1 mmol/L (ref 20.0–28.0)
Bicarbonate: 25.3 mmol/L (ref 20.0–28.0)
Calcium, Ion: 1.16 mmol/L (ref 1.15–1.40)
Calcium, Ion: 1.17 mmol/L (ref 1.15–1.40)
HCT: 37 % (ref 36.0–46.0)
HCT: 37 % (ref 36.0–46.0)
Hemoglobin: 12.6 g/dL (ref 12.0–15.0)
Hemoglobin: 12.6 g/dL (ref 12.0–15.0)
O2 Saturation: 63 %
O2 Saturation: 64 %
Potassium: 3.5 mmol/L (ref 3.5–5.1)
Potassium: 3.5 mmol/L (ref 3.5–5.1)
Sodium: 139 mmol/L (ref 135–145)
Sodium: 139 mmol/L (ref 135–145)
TCO2: 26 mmol/L (ref 22–32)
TCO2: 27 mmol/L (ref 22–32)
pCO2, Ven: 39.6 mm[Hg] — ABNORMAL LOW (ref 44–60)
pCO2, Ven: 39.9 mm[Hg] — ABNORMAL LOW (ref 44–60)
pH, Ven: 7.411 (ref 7.25–7.43)
pH, Ven: 7.411 (ref 7.25–7.43)
pO2, Ven: 32 mm[Hg] (ref 32–45)
pO2, Ven: 33 mm[Hg] (ref 32–45)

## 2023-01-25 LAB — POCT I-STAT 7, (LYTES, BLD GAS, ICA,H+H)
Acid-base deficit: 2 mmol/L (ref 0.0–2.0)
Bicarbonate: 22.3 mmol/L (ref 20.0–28.0)
Calcium, Ion: 1.18 mmol/L (ref 1.15–1.40)
HCT: 36 % (ref 36.0–46.0)
Hemoglobin: 12.2 g/dL (ref 12.0–15.0)
O2 Saturation: 94 %
Potassium: 3.4 mmol/L — ABNORMAL LOW (ref 3.5–5.1)
Sodium: 137 mmol/L (ref 135–145)
TCO2: 23 mmol/L (ref 22–32)
pCO2 arterial: 34 mm[Hg] (ref 32–48)
pH, Arterial: 7.426 (ref 7.35–7.45)
pO2, Arterial: 68 mm[Hg] — ABNORMAL LOW (ref 83–108)

## 2023-01-25 LAB — CBC
HCT: 35.4 % — ABNORMAL LOW (ref 36.0–46.0)
Hemoglobin: 11.4 g/dL — ABNORMAL LOW (ref 12.0–15.0)
MCH: 27.2 pg (ref 26.0–34.0)
MCHC: 32.2 g/dL (ref 30.0–36.0)
MCV: 84.5 fL (ref 80.0–100.0)
Platelets: 241 10*3/uL (ref 150–400)
RBC: 4.19 MIL/uL (ref 3.87–5.11)
RDW: 16.8 % — ABNORMAL HIGH (ref 11.5–15.5)
WBC: 8 10*3/uL (ref 4.0–10.5)
nRBC: 0 % (ref 0.0–0.2)

## 2023-01-25 LAB — BASIC METABOLIC PANEL
Anion gap: 11 (ref 5–15)
BUN: 31 mg/dL — ABNORMAL HIGH (ref 8–23)
CO2: 28 mmol/L (ref 22–32)
Calcium: 8.9 mg/dL (ref 8.9–10.3)
Chloride: 101 mmol/L (ref 98–111)
Creatinine, Ser: 1.57 mg/dL — ABNORMAL HIGH (ref 0.44–1.00)
GFR, Estimated: 33 mL/min — ABNORMAL LOW (ref >60)
Glucose, Bld: 84 mg/dL (ref 70–99)
Potassium: 4.2 mmol/L (ref 3.5–5.1)
Sodium: 140 mmol/L (ref 135–145)

## 2023-01-25 LAB — HEPARIN LEVEL (UNFRACTIONATED): Heparin Unfractionated: 0.49 [IU]/mL (ref 0.30–0.70)

## 2023-01-25 SURGERY — RIGHT/LEFT HEART CATH AND CORONARY ANGIOGRAPHY
Anesthesia: LOCAL

## 2023-01-25 MED ORDER — LIDOCAINE HCL (PF) 1 % IJ SOLN
INTRAMUSCULAR | Status: AC
  Filled 2023-01-25: qty 30

## 2023-01-25 MED ORDER — SODIUM CHLORIDE 0.9% FLUSH: 3.0000 mL | INTRAVENOUS | Status: DC | PRN

## 2023-01-25 MED ORDER — HEPARIN (PORCINE) IN NACL 1000-0.9 UT/500ML-% IV SOLN
INTRAVENOUS | Status: DC | PRN
  Administered 2023-01-25 (×2): 500 mL

## 2023-01-25 MED ORDER — SODIUM CHLORIDE 0.9 % IV SOLN: INTRAVENOUS | Status: DC

## 2023-01-25 MED ORDER — MIDAZOLAM HCL 2 MG/2ML IJ SOLN
INTRAMUSCULAR | Status: AC
  Filled 2023-01-25: qty 2

## 2023-01-25 MED ORDER — VERAPAMIL HCL 2.5 MG/ML IV SOLN
INTRAVENOUS | Status: AC
  Filled 2023-01-25: qty 2

## 2023-01-25 MED ORDER — FENTANYL CITRATE (PF) 100 MCG/2ML IJ SOLN
INTRAMUSCULAR | Status: DC | PRN
  Administered 2023-01-25: 25 ug via INTRAVENOUS

## 2023-01-25 MED ORDER — LIDOCAINE HCL (PF) 1 % IJ SOLN
INTRAMUSCULAR | Status: DC | PRN
  Administered 2023-01-25: 10 mL

## 2023-01-25 MED ORDER — SODIUM CHLORIDE 0.9% FLUSH
3.0000 mL | Freq: Two times a day (BID) | INTRAVENOUS | Status: DC
  Administered 2023-01-26: 3 mL via INTRAVENOUS

## 2023-01-25 MED ORDER — VERAPAMIL HCL 2.5 MG/ML IV SOLN
INTRAVENOUS | Status: DC | PRN
  Administered 2023-01-25: 10 mL via INTRA_ARTERIAL

## 2023-01-25 MED ORDER — FENTANYL CITRATE (PF) 100 MCG/2ML IJ SOLN
INTRAMUSCULAR | Status: AC
  Filled 2023-01-25: qty 2

## 2023-01-25 MED ORDER — HEPARIN SODIUM (PORCINE) 1000 UNIT/ML IJ SOLN
INTRAMUSCULAR | Status: AC
  Filled 2023-01-25: qty 10

## 2023-01-25 MED ORDER — MIDAZOLAM HCL 2 MG/2ML IJ SOLN
INTRAMUSCULAR | Status: DC | PRN
  Administered 2023-01-25: 1 mg via INTRAVENOUS

## 2023-01-25 MED ORDER — IOHEXOL 350 MG/ML SOLN
INTRAVENOUS | Status: DC | PRN
  Administered 2023-01-25: 10 mL

## 2023-01-25 MED ORDER — HEPARIN SODIUM (PORCINE) 1000 UNIT/ML IJ SOLN
INTRAMUSCULAR | Status: DC | PRN
  Administered 2023-01-25: 3000 [IU] via INTRAVENOUS

## 2023-01-25 SURGICAL SUPPLY — 11 items
CATH BALLN WEDGE 5F 110CM (CATHETERS) IMPLANT
CATH INFINITI AMBI 5FR TG (CATHETERS) IMPLANT
DEVICE RAD COMP TR BAND LRG (VASCULAR PRODUCTS) IMPLANT
GLIDESHEATH SLEND A-KIT 6F 22G (SHEATH) IMPLANT
GUIDEWIRE ANGLED .035X150CM (WIRE) IMPLANT
GUIDEWIRE INQWIRE 1.5J.035X260 (WIRE) IMPLANT
INQWIRE 1.5J .035X260CM (WIRE) ×1
PACK CARDIAC CATHETERIZATION (CUSTOM PROCEDURE TRAY) ×2 IMPLANT
SET ATX-X65L (MISCELLANEOUS) IMPLANT
SHEATH GLIDE SLENDER 4/5FR (SHEATH) IMPLANT
SHEATH PROBE COVER 6X72 (BAG) IMPLANT

## 2023-01-25 NOTE — H&P (View-Only) (Signed)
Progress Note  Patient Name: Nichole Johnson Date of Encounter: 01/25/2023  Primary Cardiologist:   Norman Herrlich, MD   Subjective   No chest pain.  No SOB.   Inpatient Medications    Scheduled Meds:  aspirin EC  81 mg Oral q morning   atorvastatin  40 mg Oral Daily   levothyroxine  100 mcg Oral Q0600   metoprolol succinate  25 mg Oral Daily   Continuous Infusions:  heparin 1,000 Units/hr (01/25/23 0728)   PRN Meds: acetaminophen, nitroGLYCERIN, ondansetron (ZOFRAN) IV   Vital Signs    Vitals:   01/24/23 1039 01/24/23 2009 01/24/23 2312 01/25/23 0351  BP: 105/80 114/77 92/63 106/75  Pulse: (!) 109 (!) 109    Resp:  15 17 17   Temp:  98.2 F (36.8 C) 98.2 F (36.8 C) 98.2 F (36.8 C)  TempSrc:  Oral Oral Oral  SpO2:  98% 98%   Weight:      Height:        Intake/Output Summary (Last 24 hours) at 01/25/2023 0826 Last data filed at 01/24/2023 2048 Gross per 24 hour  Intake 944.56 ml  Output 450 ml  Net 494.56 ml   Filed Weights   01/22/23 1237 01/23/23 0400 01/24/23 0446  Weight: 56.2 kg 56.3 kg 55.6 kg    Telemetry    NSR, NSVT - Personally Reviewed  ECG    NA - Personally Reviewed  Physical Exam   GEN: No acute distress.   Neck: No  JVD Cardiac: RRR, no murmurs, rubs, or gallops.  Respiratory: Clear  to auscultation bilaterally. GI: Soft, nontender, non-distended  MS: No  edema; No deformity. Neuro:  Nonfocal  Psych: Normal affect   Labs    Chemistry Recent Labs  Lab 01/23/23 0631 01/24/23 0352 01/25/23 0249  NA 136 137 140  K 4.5 3.4* 4.2  CL 97* 98 101  CO2 30 26 28   GLUCOSE 99 97 84  BUN 29* 35* 31*  CREATININE 1.61* 1.26* 1.57*  CALCIUM 9.2 8.7* 8.9  GFRNONAA 32* 43* 33*  ANIONGAP 9 13 11      Hematology Recent Labs  Lab 01/23/23 0631 01/24/23 0352 01/25/23 0249  WBC 8.0 7.5 8.0  RBC 4.20 4.22 4.19  HGB 11.5* 11.6* 11.4*  HCT 35.5* 35.5* 35.4*  MCV 84.5 84.1 84.5  MCH 27.4 27.5 27.2  MCHC 32.4 32.7 32.2  RDW  16.5* 16.6* 16.8*  PLT 219 245 241    Cardiac EnzymesNo results for input(s): "TROPONINI" in the last 168 hours. No results for input(s): "TROPIPOC" in the last 168 hours.   BNP Recent Labs  Lab 01/18/23 1326 01/21/23 1948  BNP  --  3,090.9*  PROBNP 9,910*  --      DDimer No results for input(s): "DDIMER" in the last 168 hours.   Radiology    ECHOCARDIOGRAM COMPLETE  Result Date: 01/23/2023    ECHOCARDIOGRAM REPORT   Patient Name:   DELPHINE CRYDER Date of Exam: 01/23/2023 Medical Rec #:  [id number]    Height:       59.0 in Accession #:    [id number]   Weight:       124.1 lb Date of Birth:  1946/02/13    BSA:          1.506 m Patient Age:    77 years     BP:           85/64 mmHg Patient Gender: F  HR:           95 bpm. Exam Location:  Inpatient Procedure: 2D Echo, Color Doppler, Cardiac Doppler and Intracardiac            Opacification Agent Indications:    CHF  History:        Patient has prior history of Echocardiogram examinations, most                 recent 10/21/2022. CHF, CKD, Brease Cancer; Risk                 Factors:Dyslipidemia, Hypertension and Sleep Apnea.  Sonographer:    Milbert Coulter Referring Phys: [id number] Jonita Albee IMPRESSIONS  1. Left ventricular ejection fraction, by estimation, is 25 to 30%. The left ventricle has severely decreased function. The left ventricle demonstrates global hypokinesis. The left ventricular internal cavity size was dilated. Left ventricular diastolic  parameters are consistent with Grade III diastolic dysfunction (restrictive). Elevated left atrial pressure.  2. Right ventricular systolic function is low normal. The right ventricular size is normal. There is normal pulmonary artery systolic pressure.  3. The mitral valve is degenerative. Moderate mitral valve regurgitation. No evidence of mitral stenosis.  4. The aortic valve is tricuspid. Aortic valve regurgitation is not visualized. No aortic stenosis is present.  5. The inferior vena  cava is normal in size with <50% respiratory variability, suggesting right atrial pressure of 8 mmHg. Comparison(s): A prior study was performed on 10/21/2022. LVEF was reported 30-35% and now 25-30%, Grade 3 diastolic dysfunction noted on current study, otherwise no significant change. FINDINGS  Left Ventricle: Left ventricular ejection fraction, by estimation, is 25 to 30%. The left ventricle has severely decreased function. The left ventricle demonstrates global hypokinesis. Definity contrast agent was given IV to delineate the left ventricular endocardial borders. The left ventricular internal cavity size was dilated. There is no left ventricular hypertrophy. Left ventricular diastolic parameters are consistent with Grade III diastolic dysfunction (restrictive). Elevated left atrial pressure. Right Ventricle: The right ventricular size is normal. Right vetricular wall thickness was not well visualized. Right ventricular systolic function is low normal. There is normal pulmonary artery systolic pressure. The tricuspid regurgitant velocity is 2.63 m/s, and with an assumed right atrial pressure of 8 mmHg, the estimated right ventricular systolic pressure is 35.7 mmHg. Left Atrium: Left atrial size was normal in size. Right Atrium: Right atrial size was normal in size. Pericardium: There is no evidence of pericardial effusion. Mitral Valve: The mitral valve is degenerative in appearance. Moderate mitral valve regurgitation. No evidence of mitral valve stenosis. Tricuspid Valve: The tricuspid valve is normal in structure. Tricuspid valve regurgitation is mild . No evidence of tricuspid stenosis. Aortic Valve: The aortic valve is tricuspid. Aortic valve regurgitation is not visualized. No aortic stenosis is present. Aortic valve mean gradient measures 3.0 mmHg. Aortic valve peak gradient measures 4.7 mmHg. Aortic valve area, by VTI measures 1.35 cm. Pulmonic Valve: The pulmonic valve was not well visualized. Pulmonic  valve regurgitation is trivial. No evidence of pulmonic stenosis. Aorta: The aortic root and ascending aorta are structurally normal, with no evidence of dilitation. Venous: The inferior vena cava is normal in size with less than 50% respiratory variability, suggesting right atrial pressure of 8 mmHg. IAS/Shunts: The interatrial septum appears to be lipomatous. The interatrial septum was not well visualized.  LEFT VENTRICLE PLAX 2D LVIDd:         5.90 cm   Diastology LVIDs:  5.30 cm   LV e' medial:    3.81 cm/s LV PW:         0.80 cm   LV E/e' medial:  29.9 LV IVS:        0.60 cm   LV e' lateral:   13.10 cm/s LVOT diam:     1.80 cm   LV E/e' lateral: 8.7 LV SV:         22 LV SV Index:   14 LVOT Area:     2.54 cm  RIGHT VENTRICLE RV Basal diam:  2.70 cm RV Mid diam:    1.30 cm RV S prime:     13.90 cm/s TAPSE (M-mode): 1.3 cm LEFT ATRIUM             Index        RIGHT ATRIUM           Index LA diam:        3.70 cm 2.46 cm/m   RA Area:     12.90 cm LA Vol (A2C):   48.8 ml 32.41 ml/m  RA Volume:   26.80 ml  17.80 ml/m LA Vol (A4C):   42.6 ml 28.29 ml/m LA Biplane Vol: 46.1 ml 30.61 ml/m  AORTIC VALVE AV Area (Vmax):    1.54 cm AV Area (Vmean):   1.34 cm AV Area (VTI):     1.35 cm AV Vmax:           108.00 cm/s AV Vmean:          74.500 cm/s AV VTI:            0.161 m AV Peak Grad:      4.7 mmHg AV Mean Grad:      3.0 mmHg LVOT Vmax:         65.50 cm/s LVOT Vmean:        39.300 cm/s LVOT VTI:          0.086 m LVOT/AV VTI ratio: 0.53  AORTA Ao Root diam: 2.70 cm Ao Asc diam:  2.50 cm MITRAL VALVE                  TRICUSPID VALVE MV Area (PHT): 6.83 cm       TR Peak grad:   27.7 mmHg MV Decel Time: 111 msec       TR Vmax:        263.00 cm/s MR Peak grad:    74.3 mmHg MR Mean grad:    48.0 mmHg    SHUNTS MR Vmax:         431.00 cm/s  Systemic VTI:  0.09 m MR Vmean:        329.0 cm/s   Systemic Diam: 1.80 cm MR PISA:         1.57 cm MR PISA Eff ROA: 11 mm MR PISA Radius:  0.50 cm MV E velocity: 114.00  cm/s MV A velocity: 40.40 cm/s MV E/A ratio:  2.82 Sunit Tolia Electronically signed by Tessa Lerner Signature Date/Time: 01/23/2023/7:01:59 PM    Final     Cardiac Studies   Echo:    01/23/23   1. Left ventricular ejection fraction, by estimation, is 25 to 30%. The  left ventricle has severely decreased function. The left ventricle  demonstrates global hypokinesis. The left ventricular internal cavity size  was dilated. Left ventricular diastolic   parameters are consistent with Grade III diastolic dysfunction  (restrictive). Elevated left atrial pressure.   2. Right ventricular  systolic function is low normal. The right  ventricular size is normal. There is normal pulmonary artery systolic  pressure.   3. The mitral valve is degenerative. Moderate mitral valve regurgitation.  No evidence of mitral stenosis.   4. The aortic valve is tricuspid. Aortic valve regurgitation is not  visualized. No aortic stenosis is present.   5. The inferior vena cava is normal in size with <50% respiratory  variability, suggesting right atrial pressure of 8 mmHg.   Patient Profile     77 y.o. 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  Likely even intake and output yesterday.   She was not given diuresis yesterday.  She is off GDMT with hypotension and with patient/husband preference.  She did tolerate low dose of beta blocker yesterday.      NSTEMI:    Trop peak this admission greater than 11K.  She looks to have had possibly had a distant MI as documented.  Trop also markedly elevated this admission.  She remains on heparin and ASA.    Plan right and left heart cath today.  I discussed this with the patient and her husband.  The  patient understands that risks included but are not limited to stroke (1 in 1000), death (1 in 1000), kidney failure [usually temporary] (1 in 500), bleeding (1 in 200), allergic reaction [possibly serious] (1 in 200).  The patient understands and agrees to proceed.   CKDIIIa:   Creat [place] up slightly from  yesterday.   No LV gram.  Holding off on titrating ARNi, spiro.     For questions or updates, please contact [facility] Please consult [email or web address] for contact info under Cardiology/STEMI.   Signed, Rollene Rotunda, MD  01/25/2023, 8:26 AM

## 2023-01-25 NOTE — Care Management Important Message (Signed)
Important Message  Patient Details  Name: Nichole Johnson MRN: [id number] Date of Birth: 1945/07/22   Important Message Given:  Yes - Medicare IM     Dorena Bodo 01/25/2023, 2:59 PM

## 2023-01-25 NOTE — Progress Notes (Signed)
Progress Note  Patient Name: Nichole Johnson Date of Encounter: 01/25/2023  Primary Cardiologist:   Norman Herrlich, MD   Subjective   No chest pain.  No SOB.   Inpatient Medications    Scheduled Meds:  aspirin EC  81 mg Oral q morning   atorvastatin  40 mg Oral Daily   levothyroxine  100 mcg Oral Q0600   metoprolol succinate  25 mg Oral Daily   Continuous Infusions:  heparin 1,000 Units/hr (01/25/23 0728)   PRN Meds: acetaminophen, nitroGLYCERIN, ondansetron (ZOFRAN) IV   Vital Signs    Vitals:   01/24/23 1039 01/24/23 2009 01/24/23 2312 01/25/23 0351  BP: 105/80 114/77 92/63 106/75  Pulse: (!) 109 (!) 109    Resp:  15 17 17   Temp:  98.2 F (36.8 C) 98.2 F (36.8 C) 98.2 F (36.8 C)  TempSrc:  Oral Oral Oral  SpO2:  98% 98%   Weight:      Height:        Intake/Output Summary (Last 24 hours) at 01/25/2023 0826 Last data filed at 01/24/2023 2048 Gross per 24 hour  Intake 944.56 ml  Output 450 ml  Net 494.56 ml   Filed Weights   01/22/23 1237 01/23/23 0400 01/24/23 0446  Weight: 56.2 kg 56.3 kg 55.6 kg    Telemetry    NSR, NSVT - Personally Reviewed  ECG    NA - Personally Reviewed  Physical Exam   GEN: No acute distress.   Neck: No  JVD Cardiac: RRR, no murmurs, rubs, or gallops.  Respiratory: Clear  to auscultation bilaterally. GI: Soft, nontender, non-distended  MS: No  edema; No deformity. Neuro:  Nonfocal  Psych: Normal affect   Labs    Chemistry Recent Labs  Lab 01/23/23 0631 01/24/23 0352 01/25/23 0249  NA 136 137 140  K 4.5 3.4* 4.2  CL 97* 98 101  CO2 30 26 28   GLUCOSE 99 97 84  BUN 29* 35* 31*  CREATININE 1.61* 1.26* 1.57*  CALCIUM 9.2 8.7* 8.9  GFRNONAA 32* 43* 33*  ANIONGAP 9 13 11      Hematology Recent Labs  Lab 01/23/23 0631 01/24/23 0352 01/25/23 0249  WBC 8.0 7.5 8.0  RBC 4.20 4.22 4.19  HGB 11.5* 11.6* 11.4*  HCT 35.5* 35.5* 35.4*  MCV 84.5 84.1 84.5  MCH 27.4 27.5 27.2  MCHC 32.4 32.7 32.2  RDW  16.5* 16.6* 16.8*  PLT 219 245 241    Cardiac EnzymesNo results for input(s): "TROPONINI" in the last 168 hours. No results for input(s): "TROPIPOC" in the last 168 hours.   BNP Recent Labs  Lab 01/18/23 1326 01/21/23 1948  BNP  --  3,090.9*  PROBNP 9,910*  --      DDimer No results for input(s): "DDIMER" in the last 168 hours.   Radiology    ECHOCARDIOGRAM COMPLETE  Result Date: 01/23/2023    ECHOCARDIOGRAM REPORT   Patient Name:   Nichole Johnson Date of Exam: 01/23/2023 Medical Rec #:  [id number]    Height:       59.0 in Accession #:    [id number]   Weight:       124.1 lb Date of Birth:  1946/02/13    BSA:          1.506 m Patient Age:    77 years     BP:           85/64 mmHg Patient Gender: F  HR:           95 bpm. Exam Location:  Inpatient Procedure: 2D Echo, Color Doppler, Cardiac Doppler and Intracardiac            Opacification Agent Indications:    CHF  History:        Patient has prior history of Echocardiogram examinations, most                 recent 10/21/2022. CHF, CKD, Brease Cancer; Risk                 Factors:Dyslipidemia, Hypertension and Sleep Apnea.  Sonographer:    Milbert Coulter Referring Phys: [id number] Jonita Albee IMPRESSIONS  1. Left ventricular ejection fraction, by estimation, is 25 to 30%. The left ventricle has severely decreased function. The left ventricle demonstrates global hypokinesis. The left ventricular internal cavity size was dilated. Left ventricular diastolic  parameters are consistent with Grade III diastolic dysfunction (restrictive). Elevated left atrial pressure.  2. Right ventricular systolic function is low normal. The right ventricular size is normal. There is normal pulmonary artery systolic pressure.  3. The mitral valve is degenerative. Moderate mitral valve regurgitation. No evidence of mitral stenosis.  4. The aortic valve is tricuspid. Aortic valve regurgitation is not visualized. No aortic stenosis is present.  5. The inferior vena  cava is normal in size with <50% respiratory variability, suggesting right atrial pressure of 8 mmHg. Comparison(s): A prior study was performed on 10/21/2022. LVEF was reported 30-35% and now 25-30%, Grade 3 diastolic dysfunction noted on current study, otherwise no significant change. FINDINGS  Left Ventricle: Left ventricular ejection fraction, by estimation, is 25 to 30%. The left ventricle has severely decreased function. The left ventricle demonstrates global hypokinesis. Definity contrast agent was given IV to delineate the left ventricular endocardial borders. The left ventricular internal cavity size was dilated. There is no left ventricular hypertrophy. Left ventricular diastolic parameters are consistent with Grade III diastolic dysfunction (restrictive). Elevated left atrial pressure. Right Ventricle: The right ventricular size is normal. Right vetricular wall thickness was not well visualized. Right ventricular systolic function is low normal. There is normal pulmonary artery systolic pressure. The tricuspid regurgitant velocity is 2.63 m/s, and with an assumed right atrial pressure of 8 mmHg, the estimated right ventricular systolic pressure is 35.7 mmHg. Left Atrium: Left atrial size was normal in size. Right Atrium: Right atrial size was normal in size. Pericardium: There is no evidence of pericardial effusion. Mitral Valve: The mitral valve is degenerative in appearance. Moderate mitral valve regurgitation. No evidence of mitral valve stenosis. Tricuspid Valve: The tricuspid valve is normal in structure. Tricuspid valve regurgitation is mild . No evidence of tricuspid stenosis. Aortic Valve: The aortic valve is tricuspid. Aortic valve regurgitation is not visualized. No aortic stenosis is present. Aortic valve mean gradient measures 3.0 mmHg. Aortic valve peak gradient measures 4.7 mmHg. Aortic valve area, by VTI measures 1.35 cm. Pulmonic Valve: The pulmonic valve was not well visualized. Pulmonic  valve regurgitation is trivial. No evidence of pulmonic stenosis. Aorta: The aortic root and ascending aorta are structurally normal, with no evidence of dilitation. Venous: The inferior vena cava is normal in size with less than 50% respiratory variability, suggesting right atrial pressure of 8 mmHg. IAS/Shunts: The interatrial septum appears to be lipomatous. The interatrial septum was not well visualized.  LEFT VENTRICLE PLAX 2D LVIDd:         5.90 cm   Diastology LVIDs:  5.30 cm   LV e' medial:    3.81 cm/s LV PW:         0.80 cm   LV E/e' medial:  29.9 LV IVS:        0.60 cm   LV e' lateral:   13.10 cm/s LVOT diam:     1.80 cm   LV E/e' lateral: 8.7 LV SV:         22 LV SV Index:   14 LVOT Area:     2.54 cm  RIGHT VENTRICLE RV Basal diam:  2.70 cm RV Mid diam:    1.30 cm RV S prime:     13.90 cm/s TAPSE (M-mode): 1.3 cm LEFT ATRIUM             Index        RIGHT ATRIUM           Index LA diam:        3.70 cm 2.46 cm/m   RA Area:     12.90 cm LA Vol (A2C):   48.8 ml 32.41 ml/m  RA Volume:   26.80 ml  17.80 ml/m LA Vol (A4C):   42.6 ml 28.29 ml/m LA Biplane Vol: 46.1 ml 30.61 ml/m  AORTIC VALVE AV Area (Vmax):    1.54 cm AV Area (Vmean):   1.34 cm AV Area (VTI):     1.35 cm AV Vmax:           108.00 cm/s AV Vmean:          74.500 cm/s AV VTI:            0.161 m AV Peak Grad:      4.7 mmHg AV Mean Grad:      3.0 mmHg LVOT Vmax:         65.50 cm/s LVOT Vmean:        39.300 cm/s LVOT VTI:          0.086 m LVOT/AV VTI ratio: 0.53  AORTA Ao Root diam: 2.70 cm Ao Asc diam:  2.50 cm MITRAL VALVE                  TRICUSPID VALVE MV Area (PHT): 6.83 cm       TR Peak grad:   27.7 mmHg MV Decel Time: 111 msec       TR Vmax:        263.00 cm/s MR Peak grad:    74.3 mmHg MR Mean grad:    48.0 mmHg    SHUNTS MR Vmax:         431.00 cm/s  Systemic VTI:  0.09 m MR Vmean:        329.0 cm/s   Systemic Diam: 1.80 cm MR PISA:         1.57 cm MR PISA Eff ROA: 11 mm MR PISA Radius:  0.50 cm MV E velocity: 114.00  cm/s MV A velocity: 40.40 cm/s MV E/A ratio:  2.82 Sunit Tolia Electronically signed by Tessa Lerner Signature Date/Time: 01/23/2023/7:01:59 PM    Final     Cardiac Studies   Echo:    01/23/23   1. Left ventricular ejection fraction, by estimation, is 25 to 30%. The  left ventricle has severely decreased function. The left ventricle  demonstrates global hypokinesis. The left ventricular internal cavity size  was dilated. Left ventricular diastolic   parameters are consistent with Grade III diastolic dysfunction  (restrictive). Elevated left atrial pressure.   2. Right ventricular  systolic function is low normal. The right  ventricular size is normal. There is normal pulmonary artery systolic  pressure.   3. The mitral valve is degenerative. Moderate mitral valve regurgitation.  No evidence of mitral stenosis.   4. The aortic valve is tricuspid. Aortic valve regurgitation is not  visualized. No aortic stenosis is present.   5. The inferior vena cava is normal in size with <50% respiratory  variability, suggesting right atrial pressure of 8 mmHg.   Patient Profile     77 y.o. 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  Likely even intake and output yesterday.   She was not given diuresis yesterday.  She is off GDMT with hypotension and with patient/husband preference.  She did tolerate low dose of beta blocker yesterday.      NSTEMI:    Trop peak this admission greater than 11K.  She looks to have had possibly had a distant MI as documented.  Trop also markedly elevated this admission.  She remains on heparin and ASA.    Plan right and left heart cath today.  I discussed this with the patient and her husband.  The  patient understands that risks included but are not limited to stroke (1 in 1000), death (1 in 1000), kidney failure [usually temporary] (1 in 500), bleeding (1 in 200), allergic reaction [possibly serious] (1 in 200).  The patient understands and agrees to proceed.   CKDIIIa:   Creat [place] up slightly from  yesterday.   No LV gram.  Holding off on titrating ARNi, spiro.     For questions or updates, please contact [facility] Please consult [email or web address] for contact info under Cardiology/STEMI.   Signed, Rollene Rotunda, MD  01/25/2023, 8:26 AM

## 2023-01-25 NOTE — Plan of Care (Signed)
  Problem: Education: Goal: Knowledge of General Education information will improve Description: Including pain rating scale, medication(s)/side effects and non-pharmacologic comfort measures Outcome: Progressing   Problem: Health Behavior/Discharge Planning: Goal: Ability to manage health-related needs will improve Outcome: Progressing   Problem: Clinical Measurements: Goal: Ability to maintain clinical measurements within normal limits will improve Outcome: Progressing Goal: Will remain free from infection Outcome: Progressing Goal: Diagnostic test results will improve Outcome: Progressing Goal: Respiratory complications will improve Outcome: Progressing Goal: Cardiovascular complication will be avoided Outcome: Progressing   Problem: Activity: Goal: Risk for activity intolerance will decrease Outcome: Progressing   Problem: Coping: Goal: Level of anxiety will decrease Outcome: Progressing   Problem: Education: Goal: Understanding of cardiac disease, CV risk reduction, and recovery process will improve Outcome: Progressing Goal: Individualized Educational Video(s) Outcome: Progressing   Problem: Activity: Goal: Ability to tolerate increased activity will improve Outcome: Progressing   Problem: Cardiac: Goal: Ability to achieve and maintain adequate cardiovascular perfusion will improve Outcome: Progressing   Problem: Health Behavior/Discharge Planning: Goal: Ability to safely manage health-related needs after discharge will improve Outcome: Progressing   Problem: Education: Goal: Understanding of CV disease, CV risk reduction, and recovery process will improve Outcome: Progressing Goal: Individualized Educational Video(s) Outcome: Progressing   Problem: Activity: Goal: Ability to return to baseline activity level will improve Outcome: Progressing   Problem: Cardiovascular: Goal: Ability to achieve and maintain adequate cardiovascular perfusion will  improve Outcome: Progressing Goal: Vascular access site(s) Level 0-1 will be maintained Outcome: Progressing   Problem: Health Behavior/Discharge Planning: Goal: Ability to safely manage health-related needs after discharge will improve Outcome: Progressing

## 2023-01-25 NOTE — Interval H&P Note (Signed)
History and Physical Interval Note:  01/25/2023 1:20 PM  Nichole Johnson  has presented today for surgery, with the diagnosis of nstemi.  The various methods of treatment have been discussed with the patient and family. After consideration of risks, benefits and other options for treatment, the patient has consented to  Procedure(s): RIGHT/LEFT HEART CATH AND CORONARY ANGIOGRAPHY (N/A) and possible coronary intervention as a surgical intervention.  The patient's history has been reviewed, patient examined, no change in status, stable for surgery.  I have reviewed the patient's chart and labs.  Questions were answered to the patient's satisfaction.   Cath Lab Visit (complete for each Cath Lab visit)  Clinical Evaluation Leading to the Procedure:   ACS: Yes.    Non-ACS:    Anginal Classification: CCS IV  Anti-ischemic medical therapy: Minimal Therapy (1 class of medications)  Non-Invasive Test Results: No non-invasive testing performed  Prior CABG: No previous CABG   Yates Decamp

## 2023-01-25 NOTE — Progress Notes (Signed)
PHARMACY - ANTICOAGULATION CONSULT NOTE  Pharmacy Consult for heparin Indication: chest pain/ACS  Allergies  Allergen Reactions   Penicillins Itching and Other (See Comments)    Vulvovaginal Candidiasis with associated pruritis  Vaginal Infections  Vaginal Infections    Vulvovaginal Candidiasis with associated pruritis    Vulvovaginal Candidiasis with associated pruritis Vaginal Infections Vaginal Infections   Sulfamethoxazole-Trimethoprim Other (See Comments)    Makes her shaky and sweat   Tramadol Other (See Comments)    Makes her shaky and sweat    Patient Measurements: Height: 4\' 11"  (149.9 cm) Weight: 55.6 kg (122 lb 9.2 oz) IBW/kg (Calculated) : 43.2 Heparin Dosing Weight: 54.6 kg  Vital Signs: Temp: 98.2 F (36.8 C) (10/14 0351) Temp Source: Oral (10/14 0351) BP: 106/75 (10/14 0351)  Labs: Recent Labs    01/23/23 0631 01/23/23 0829 01/23/23 1755 01/24/23 0352 01/25/23 0249  HGB 11.5*  --   --  11.6* 11.4*  HCT 35.5*  --   --  35.5* 35.4*  PLT 219  --   --  245 241  HEPARINUNFRC  --    < > 0.41 0.48 0.49  CREATININE 1.61*  --   --  1.26* 1.57*   < > = values in this interval not displayed.    Estimated Creatinine Clearance: 22.1 mL/min (A) (by C-G formula based on SCr of 1.57 mg/dL (H)).   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   Dilated cardiomyopathy (HCC) 04/10/2018   Dyspepsia 01/18/2017   Dysplastic nevus 12/02/2016   Glaucoma 10/14/2017   Hiatal hernia 07/20/2019   High cholesterol    History of breast cancer  04/23/2020   Hyperlipidemia 04/18/2014   Hypertension    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 Pt was given copy of recent ECHO report.  Last Assessment & Plan:  Pt will f/u w/ Cardiology re this. Pt was given copy of recent ECHO report.    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 Added automatically from request for surgery [id number] Formatting of this note might be different from the original. Added automatically from request for surgery [id number]   Osteopenia of hip 04/21/2020   Formatting of this note might be different from the original. DEXA 06/2017: Lumbar T score -1.4, right total femur -1.4. She restarted alendronate since 05/2019. Formatting of this note might be different from the original. DEXA 06/2017: Lumbar T score -1.4, right total femur -1.4. She restarted alendronate since 05/2019.   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 Start using Flonase daily. Stop using OTC Afrin Formatting of this note might be different from the original. Last Assessment & Plan: Start using Flonase daily. Stop using OTC Afrin Formatting of this note might be different from the original. Overview: Last Assessment & Plan: Start using Flonase daily. Stop usin   Stage 3a chronic kidney disease  (HCC) 04/01/2021   Trigger finger of left hand 07/26/2018     Assessment: 68 YOF with a PMH asthma, hypertension, cardiomyopathy, CHF, CKD presented to the ED with SOB and elevated troponin. Patient is not on anticoagulation PTA. Pharmacy consulted to dose heparin. Troponin 78469, elevated  Heparin level remains therapeutic (0.49) on 1000 units/hr. No issues with infusion or signs of bleeding reported. Hgb stable 11s. PLTc WNL.  Goal of Therapy:  Heparin level 0.3-0.7 units/ml Monitor platelets by anticoagulation protocol: Yes   Plan:  Continue heparin infusion 1000 units/hr Check daily heparin level and CBC Tentative plan for LHC today  Cedric Fishman, PharmD, BCPS, BCCCP Clinical Pharmacist

## 2023-01-25 NOTE — Progress Notes (Signed)
Band removed at 1730. No signs of bleeding , hematoma, or discomfort.

## 2023-01-25 NOTE — Progress Notes (Signed)
Heart Failure Navigator Progress Note  Assessed for Heart & Vascular TOC clinic readiness.  Patient does not meet criteria due to Advanced Heart Failure Team patient (Dr.McLean) on 02/22/23.  Navigator will sign off at this time.  Roxy Horseman, RN, BSN [facility] Heart Failure Navigator Secure Chat Only

## 2023-01-25 NOTE — Plan of Care (Signed)
Problem: Elimination: Goal: Will not experience complications related to bowel motility Outcome: Completed/Met Goal: Will not experience complications related to urinary retention Outcome: Completed/Met   Problem: Safety: Goal: Ability to remain free from injury will improve Outcome: Completed/Met   Problem: Skin Integrity: Goal: Risk for impaired skin integrity will decrease Outcome: Completed/Met

## 2023-01-26 ENCOUNTER — Telehealth: Payer: Self-pay | Admitting: Physician Assistant

## 2023-01-26 ENCOUNTER — Encounter (HOSPITAL_COMMUNITY): Payer: Self-pay | Admitting: Cardiology

## 2023-01-26 ENCOUNTER — Other Ambulatory Visit (HOSPITAL_COMMUNITY): Payer: Self-pay

## 2023-01-26 DIAGNOSIS — I214 Non-ST elevation (NSTEMI) myocardial infarction: Secondary | ICD-10-CM | POA: Insufficient documentation

## 2023-01-26 DIAGNOSIS — I2510 Atherosclerotic heart disease of native coronary artery without angina pectoris: Secondary | ICD-10-CM | POA: Insufficient documentation

## 2023-01-26 DIAGNOSIS — I5023 Acute on chronic systolic (congestive) heart failure: Secondary | ICD-10-CM | POA: Diagnosis not present

## 2023-01-26 LAB — CBC
HCT: 36.1 % (ref 36.0–46.0)
Hemoglobin: 11.4 g/dL — ABNORMAL LOW (ref 12.0–15.0)
MCH: 26.8 pg (ref 26.0–34.0)
MCHC: 31.6 g/dL (ref 30.0–36.0)
MCV: 84.9 fL (ref 80.0–100.0)
Platelets: 250 10*3/uL (ref 150–400)
RBC: 4.25 MIL/uL (ref 3.87–5.11)
RDW: 16.7 % — ABNORMAL HIGH (ref 11.5–15.5)
WBC: 7.3 10*3/uL (ref 4.0–10.5)
nRBC: 0.3 % — ABNORMAL HIGH (ref 0.0–0.2)

## 2023-01-26 LAB — BASIC METABOLIC PANEL
Anion gap: 13 (ref 5–15)
BUN: 22 mg/dL (ref 8–23)
CO2: 24 mmol/L (ref 22–32)
Calcium: 9.1 mg/dL (ref 8.9–10.3)
Chloride: 99 mmol/L (ref 98–111)
Creatinine, Ser: 1.22 mg/dL — ABNORMAL HIGH (ref 0.44–1.00)
GFR, Estimated: 45 mL/min — ABNORMAL LOW (ref >60)
Glucose, Bld: 109 mg/dL — ABNORMAL HIGH (ref 70–99)
Potassium: 4 mmol/L (ref 3.5–5.1)
Sodium: 136 mmol/L (ref 135–145)

## 2023-01-26 MED ORDER — TORSEMIDE 20 MG PO TABS
20.0000 mg | ORAL_TABLET | Freq: Two times a day (BID) | ORAL | Status: DC
  Administered 2023-01-26: 20 mg via ORAL
  Filled 2023-01-26: qty 1

## 2023-01-26 MED ORDER — ASPIRIN 81 MG PO TBEC
81.0000 mg | DELAYED_RELEASE_TABLET | Freq: Every day | ORAL | Status: DC
  Administered 2023-01-26: 81 mg via ORAL
  Filled 2023-01-26: qty 1

## 2023-01-26 MED ORDER — NITROGLYCERIN 0.4 MG SL SUBL: 0.4000 mg | SUBLINGUAL_TABLET | SUBLINGUAL | 3 refills | Status: AC | PRN

## 2023-01-26 MED ORDER — DAPAGLIFLOZIN PROPANEDIOL 10 MG PO TABS
10.0000 mg | ORAL_TABLET | Freq: Every day | ORAL | 5 refills | Status: DC
  Filled 2023-01-26: qty 30, 30d supply, fill #0

## 2023-01-26 MED ORDER — ATORVASTATIN CALCIUM 40 MG PO TABS: 40.0000 mg | ORAL_TABLET | Freq: Every day | ORAL | 5 refills | Status: DC

## 2023-01-26 MED ORDER — METOPROLOL SUCCINATE ER 25 MG PO TB24
25.0000 mg | ORAL_TABLET | Freq: Every day | ORAL | 5 refills | Status: DC
  Filled 2023-01-26: qty 30, 30d supply, fill #0

## 2023-01-26 MED ORDER — METOPROLOL SUCCINATE ER 25 MG PO TB24: 25.0000 mg | ORAL_TABLET | Freq: Every day | ORAL | 5 refills | Status: DC

## 2023-01-26 MED ORDER — ATORVASTATIN CALCIUM 40 MG PO TABS
40.0000 mg | ORAL_TABLET | Freq: Every day | ORAL | 5 refills | Status: DC
  Filled 2023-01-26: qty 30, 30d supply, fill #0

## 2023-01-26 MED ORDER — DAPAGLIFLOZIN PROPANEDIOL 10 MG PO TABS: 10.0000 mg | ORAL_TABLET | Freq: Every day | ORAL | 5 refills | Status: DC

## 2023-01-26 MED ORDER — DAPAGLIFLOZIN PROPANEDIOL 10 MG PO TABS
10.0000 mg | ORAL_TABLET | Freq: Every day | ORAL | Status: DC
  Administered 2023-01-26: 10 mg via ORAL
  Filled 2023-01-26: qty 1

## 2023-01-26 MED ORDER — NITROGLYCERIN 0.4 MG SL SUBL
0.4000 mg | SUBLINGUAL_TABLET | SUBLINGUAL | 3 refills | Status: DC | PRN
  Filled 2023-01-26: qty 25, 1d supply, fill #0

## 2023-01-26 NOTE — Plan of Care (Signed)
  Problem: Education: Goal: Knowledge of General Education information will improve Description: Including pain rating scale, medication(s)/side effects and non-pharmacologic comfort measures Outcome: Adequate for Discharge   Problem: Health Behavior/Discharge Planning: Goal: Ability to manage health-related needs will improve Outcome: Adequate for Discharge   Problem: Clinical Measurements: Goal: Ability to maintain clinical measurements within normal limits will improve Outcome: Adequate for Discharge Goal: Will remain free from infection Outcome: Adequate for Discharge Goal: Diagnostic test results will improve Outcome: Adequate for Discharge Goal: Respiratory complications will improve Outcome: Adequate for Discharge Goal: Cardiovascular complication will be avoided Outcome: Adequate for Discharge   Problem: Activity: Goal: Risk for activity intolerance will decrease Outcome: Adequate for Discharge   Problem: Coping: Goal: Level of anxiety will decrease Outcome: Adequate for Discharge   Problem: Education: Goal: Understanding of cardiac disease, CV risk reduction, and recovery process will improve Outcome: Adequate for Discharge Goal: Individualized Educational Video(s) Outcome: Adequate for Discharge   Problem: Activity: Goal: Ability to tolerate increased activity will improve Outcome: Adequate for Discharge   Problem: Cardiac: Goal: Ability to achieve and maintain adequate cardiovascular perfusion will improve Outcome: Adequate for Discharge   Problem: Health Behavior/Discharge Planning: Goal: Ability to safely manage health-related needs after discharge will improve Outcome: Adequate for Discharge   Problem: Education: Goal: Understanding of CV disease, CV risk reduction, and recovery process will improve Outcome: Adequate for Discharge Goal: Individualized Educational Video(s) Outcome: Adequate for Discharge   Problem: Activity: Goal: Ability to return  to baseline activity level will improve Outcome: Adequate for Discharge   Problem: Cardiovascular: Goal: Ability to achieve and maintain adequate cardiovascular perfusion will improve Outcome: Adequate for Discharge Goal: Vascular access site(s) Level 0-1 will be maintained Outcome: Adequate for Discharge   Problem: Health Behavior/Discharge Planning: Goal: Ability to safely manage health-related needs after discharge will improve Outcome: Adequate for Discharge

## 2023-01-26 NOTE — TOC Initial Note (Signed)
Transition of Care [facility]) - Initial/Assessment Note    Patient Details  Name: Nichole Johnson MRN: [id number] Date of Birth: 1945/06/26  Transition of Care [facility]) CM/SW Contact:    Leone Haven, RN Phone Number: 01/26/2023, 1:09 PM  Clinical Narrative:                 From home with spouse, has PCP and insurance on file, states has no HH services in place at this time, she has a cane but does not use it.  States spouse will transport her home at [employer] and he is support system, states gets medications from [facility] in and [place] neighborhood on [address] in [place].  Pta self ambulatory.  Expected Discharge Plan: Home/Self Care Barriers to Discharge: No Barriers Identified   Patient Goals and CMS Choice Patient states their goals for this hospitalization and ongoing recovery are:: return home   Choice offered to / list presented to : NA      Expected Discharge Plan and Services In-house Referral: NA Discharge Planning Services: CM Consult Post Acute Care Choice: NA Living arrangements for the past 2 months: Single Family Home Expected Discharge Date: 01/26/23               DME Arranged: N/A DME Agency: NA       HH Arranged: NA          Prior Living Arrangements/Services Living arrangements for the past 2 months: Single Family Home Lives with:: Spouse Patient language and need for interpreter reviewed:: Yes Do you feel safe going back to the place where you live?: Yes      Need for Family Participation in Patient Care: Yes (Comment) Care giver support system in place?: Yes (comment)   Criminal Activity/Legal Involvement Pertinent to Current Situation/Hospitalization: No - Comment as needed  Activities of Daily Living   ADL Screening (condition at time of admission) Independently performs ADLs?: Yes (appropriate for developmental age) Is the patient deaf or have difficulty hearing?: Yes Does the patient have difficulty seeing, even when  wearing glasses/contacts?: No Does the patient have difficulty concentrating, remembering, or making decisions?: No  Permission Sought/Granted Permission sought to share information with : Case Manager Permission granted to share information with : Yes, Verbal Permission Granted              Emotional Assessment Appearance:: Appears stated age Attitude/Demeanor/Rapport: Engaged Affect (typically observed): Appropriate Orientation: : Oriented to Self, Oriented to Place, Oriented to  Time, Oriented to Situation Alcohol / Substance Use: Not Applicable Psych Involvement: No (comment)  Admission diagnosis:  Acute on chronic systolic congestive heart failure (HCC) [I50.23] Heart failure (HCC) [I50.9] Acute on chronic systolic (congestive) heart failure (HCC) [I50.23] Patient Active Problem List   Diagnosis Date Noted   CAD (coronary artery disease) 01/26/2023   Non-ST elevation (NSTEMI) myocardial infarction (HCC) 01/26/2023   Acute on chronic systolic (congestive) heart failure (HCC) 01/22/2023   Heart failure (HCC) 01/21/2023   Impaired functional mobility, balance, gait, and endurance 08/07/2022   Anterolisthesis of cervical spine 04/01/2021   Stage 3a chronic kidney disease (HCC) 04/01/2021   Morton's neuroma of right foot 08/13/2020   Hypertension    High cholesterol    Cancer (HCC)    History of breast cancer 04/23/2020   Osteopenia of hip 04/21/2020   Osteoarthritis of left knee 08/17/2019   Hiatal hernia 07/20/2019   Bilateral hand pain 07/26/2018   Trigger finger of left hand 07/26/2018   Dilated cardiomyopathy (  HCC) 04/10/2018   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   AKI (acute kidney injury) (HCC) 11/03/2017   Glaucoma 10/14/2017   Reactive airway disease without complication 10/14/2017   S/P total knee arthroplasty, 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   Dyspepsia 01/18/2017   Dysplastic nevus  12/02/2016   Benign hypertension 07/08/2016   Obstructive sleep apnea (adult) (pediatric) 07/08/2016   Acute renal insufficiency 06/09/2016   Hypotension 06/09/2016   Acute on chronic systolic congestive heart failure (HCC) 05/19/2016   Irritable bowel syndrome without diarrhea 12/10/2014   Hyperlipidemia 04/18/2014   Anemia 04/18/2014   Hypothyroidism 04/18/2014   Mild diastolic dysfunction 04/18/2014   Seasonal allergies 02/26/2014   Personal history of radiation therapy 2001   Breast cancer (HCC) 2001   PCP:  [name], MD Pharmacy:   [facility] [address] [place], [place] - [address] [place] [address] Phone: [phone] Fax: [phone]  [facility] - [place], [place] - [address] [place] [address] Phone: [phone] Fax: [phone]  [facility] [address] [place] [address] Phone: [phone] Fax: [phone]     Social Determinants of Health (SDOH) Social History: SDOH Screenings   Food Insecurity: No Food Insecurity (01/22/2023)  Housing: Low Risk  (01/22/2023)  Transportation Needs: No Transportation Needs (01/22/2023)  Utilities: Not At Risk (01/22/2023)  Social Connections: Unknown (06/27/2022)   Received from [facility], [facility]  Tobacco Use: Low Risk  (01/21/2023)   SDOH Interventions:     Readmission Risk Interventions     No data to display

## 2023-01-26 NOTE — Discharge Summary (Signed)
Discharge Summary    Patient ID: Nichole Johnson MRN: [id number]; DOB: 05-Aug-1945  Admit date: 01/21/2023 Discharge date: 01/26/2023  PCP:  [name], MD   [facility] Providers Cardiologist:  Gypsy Balsam, MD        Discharge Diagnoses    Principal Problem:   Acute on chronic systolic (congestive) heart failure [facility]) Active Problems:   Hyperlipidemia   Hypertension   Stage 3a chronic kidney disease (HCC)   CAD (coronary artery disease)   Non-ST elevation (NSTEMI) myocardial infarction [facility])    Diagnostic Studies/Procedures    [facility] 01/25/23 Right & Left Heart Catheterization 01/25/23: Hemodynamic data: LV 100/2, EDP 9 mmHg.  Ao 148/66, mean 95 mmHg.  No pressure gradient across the aortic valve. RA: 3/2, mean 2 mmHg. RV 37/2, EDP 5 mmHg. PA 36/15, mean 22 mmHg.  PA saturation 64%. PW 12/12, mean 10 mmHg.  Aortic saturation 94%. QP/QS 1.00. CO 4.3, CI 2.86 by Fick.   Angiographic data: LM: Large-caliber vessel.  It smooth and normal. LAD: Large-caliber vessel, gives origin to a moderate-sized D1 and several small diagonals.  It is small and normal. LCx: Large-caliber vessel giving origin to a very tiny OM1 and OM 2 and large OM 3.  Moderate sized AV groove circumflex.  The OM 3 has a focal 80% thrombotic lesion that appears to be organized.  There is TIMI-3 flow distally. RCA: Moderate caliber vessel, dominant, smooth and normal.  Impression and recommendations: PA pressure upper limit of normal with low LVEDP, fluoroscopy reveals significant pulmonary parenchymal markings, will probably need follow-up. A focal thrombotic stenosis noted in the large OM 3 branch of CX.   Will discontinue aspirin as patient does not have any significant coronary artery disease, focal thrombus appears to be probably related to recent COVID infection.  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  I have started the  patient on Eliquis 5 mg p.o. twice daily, can be used for at least 3 months.  10 to 13 mL contrast utilized.   2D echo 01/23/23   1. Left ventricular ejection fraction, by estimation, is 25 to 30%. The  left ventricle has severely decreased function. The left ventricle  demonstrates global hypokinesis. The left ventricular internal cavity size  was dilated. Left ventricular diastolic   parameters are consistent with Grade III diastolic dysfunction  (restrictive). Elevated left atrial pressure.   2. Right ventricular systolic function is low normal. The right  ventricular size is normal. There is normal pulmonary artery systolic  pressure.   3. The mitral valve is degenerative. Moderate mitral valve regurgitation.  No evidence of mitral stenosis.   4. The aortic valve is tricuspid. Aortic valve regurgitation is not  visualized. No aortic stenosis is present.   5. The inferior vena cava is normal in size with <50% respiratory  variability, suggesting right atrial pressure of 8 mmHg.   Comparison(s): A prior study was performed on 10/21/2022. LVEF was  reported 30-35% and now 25-30%, Grade 3 diastolic dysfunction noted on  current study, otherwise no significant change.  _____________   History of Present Illness     Nichole Johnson is a 77 y.o. 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  She has been followed by Dr. Dulce Sellar then more recently Dr. Bing Matter. She remotely saw the [facility] last in 2021.  99  CO2 28  --   --  24  GLUCOSE 84  --   --  109*  BUN 31*  --   --  22  CREATININE 1.57*  --   --  1.22*  CALCIUM 8.9  --   --  9.1   < > = values in this interval not displayed.   Liver Function Tests No results for input(s): "AST", "ALT", "ALKPHOS", "BILITOT", "PROT", "ALBUMIN" in the last 72 hours. No results for input(s): "LIPASE", "AMYLASE" in the last 72 hours. High Sensitivity Troponin:   Recent Labs  Lab 01/21/23 1948 01/22/23 0136  TROPONINIHS 11,645* 8,773*   _____________  CARDIAC CATHETERIZATION  Result Date: 01/25/2023 Images from the original result were not included. Right & Left Heart Catheterization 01/25/23: Hemodynamic data: LV 100/2, EDP 9 mmHg.  Ao 148/66, mean 95 mmHg.  No pressure gradient across the aortic valve. RA: 3/2, mean 2 mmHg. RV 37/2, EDP 5 mmHg. PA 36/15, mean 22 mmHg.  PA saturation 64%. PW 12/12, mean 10 mmHg.  Aortic saturation 94%. QP/QS 1.00. CO 4.3, CI 2.86 by Fick. Angiographic data: LM: Large-caliber vessel.  It smooth and normal. LAD: Large-caliber vessel, gives origin to a moderate-sized D1 and several small  diagonals.  It is small and normal. LCx: Large-caliber vessel giving origin to a very tiny OM1 and OM 2 and large OM 3.  Moderate sized AV groove circumflex.  The OM 3 has a focal 80% thrombotic lesion that appears to be organized.  There is TIMI-3 flow distally. RCA: Moderate caliber vessel, dominant, smooth and normal. Impression and recommendations: PA pressure upper limit of normal with low LVEDP, fluoroscopy reveals significant pulmonary parenchymal markings, will probably need follow-up. A focal thrombotic stenosis noted in the large OM 3 branch of CX. Will discontinue aspirin as patient does not have any significant coronary artery disease, focal thrombus appears to be probably related to recent COVID infection.  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  I have started the patient on Eliquis 5 mg p.o. twice daily, can be used for at least 3 months.  10 to 13 mL contrast utilized.   ECHOCARDIOGRAM COMPLETE  Result Date: 01/23/2023    ECHOCARDIOGRAM REPORT   Patient Name:   Nichole Johnson Date of Exam: 01/23/2023 Medical Rec #:  [id number]    Height:       59.0 in Accession #:    [id number]   Weight:       124.1 lb Date of Birth:  [birth year]    BSA:          1.506 m Patient Age:    22 years     BP:           85/64 mmHg Patient Gender: F            HR:           95 bpm. Exam Location:  Inpatient Procedure: 2D Echo, Color Doppler, Cardiac Doppler and Intracardiac            Opacification Agent Indications:    CHF  History:        Patient has prior history of Echocardiogram examinations, most                 recent 10/21/2022. CHF, CKD, Brease Cancer; Risk                 Factors:Dyslipidemia, Hypertension and Sleep Apnea.  Sonographer:  Discharge Summary    Patient ID: Nichole Johnson MRN: [id number]; DOB: 05-Aug-1945  Admit date: 01/21/2023 Discharge date: 01/26/2023  PCP:  [name], MD   [facility] Providers Cardiologist:  Gypsy Balsam, MD        Discharge Diagnoses    Principal Problem:   Acute on chronic systolic (congestive) heart failure [facility]) Active Problems:   Hyperlipidemia   Hypertension   Stage 3a chronic kidney disease (HCC)   CAD (coronary artery disease)   Non-ST elevation (NSTEMI) myocardial infarction [facility])    Diagnostic Studies/Procedures    [facility] 01/25/23 Right & Left Heart Catheterization 01/25/23: Hemodynamic data: LV 100/2, EDP 9 mmHg.  Ao 148/66, mean 95 mmHg.  No pressure gradient across the aortic valve. RA: 3/2, mean 2 mmHg. RV 37/2, EDP 5 mmHg. PA 36/15, mean 22 mmHg.  PA saturation 64%. PW 12/12, mean 10 mmHg.  Aortic saturation 94%. QP/QS 1.00. CO 4.3, CI 2.86 by Fick.   Angiographic data: LM: Large-caliber vessel.  It smooth and normal. LAD: Large-caliber vessel, gives origin to a moderate-sized D1 and several small diagonals.  It is small and normal. LCx: Large-caliber vessel giving origin to a very tiny OM1 and OM 2 and large OM 3.  Moderate sized AV groove circumflex.  The OM 3 has a focal 80% thrombotic lesion that appears to be organized.  There is TIMI-3 flow distally. RCA: Moderate caliber vessel, dominant, smooth and normal.  Impression and recommendations: PA pressure upper limit of normal with low LVEDP, fluoroscopy reveals significant pulmonary parenchymal markings, will probably need follow-up. A focal thrombotic stenosis noted in the large OM 3 branch of CX.   Will discontinue aspirin as patient does not have any significant coronary artery disease, focal thrombus appears to be probably related to recent COVID infection.  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  I have started the  patient on Eliquis 5 mg p.o. twice daily, can be used for at least 3 months.  10 to 13 mL contrast utilized.   2D echo 01/23/23   1. Left ventricular ejection fraction, by estimation, is 25 to 30%. The  left ventricle has severely decreased function. The left ventricle  demonstrates global hypokinesis. The left ventricular internal cavity size  was dilated. Left ventricular diastolic   parameters are consistent with Grade III diastolic dysfunction  (restrictive). Elevated left atrial pressure.   2. Right ventricular systolic function is low normal. The right  ventricular size is normal. There is normal pulmonary artery systolic  pressure.   3. The mitral valve is degenerative. Moderate mitral valve regurgitation.  No evidence of mitral stenosis.   4. The aortic valve is tricuspid. Aortic valve regurgitation is not  visualized. No aortic stenosis is present.   5. The inferior vena cava is normal in size with <50% respiratory  variability, suggesting right atrial pressure of 8 mmHg.   Comparison(s): A prior study was performed on 10/21/2022. LVEF was  reported 30-35% and now 25-30%, Grade 3 diastolic dysfunction noted on  current study, otherwise no significant change.  _____________   History of Present Illness     Nichole Johnson is a 77 y.o. 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  She has been followed by Dr. Dulce Sellar then more recently Dr. Bing Matter. She remotely saw the [facility] last in 2021.  99  CO2 28  --   --  24  GLUCOSE 84  --   --  109*  BUN 31*  --   --  22  CREATININE 1.57*  --   --  1.22*  CALCIUM 8.9  --   --  9.1   < > = values in this interval not displayed.   Liver Function Tests No results for input(s): "AST", "ALT", "ALKPHOS", "BILITOT", "PROT", "ALBUMIN" in the last 72 hours. No results for input(s): "LIPASE", "AMYLASE" in the last 72 hours. High Sensitivity Troponin:   Recent Labs  Lab 01/21/23 1948 01/22/23 0136  TROPONINIHS 11,645* 8,773*   _____________  CARDIAC CATHETERIZATION  Result Date: 01/25/2023 Images from the original result were not included. Right & Left Heart Catheterization 01/25/23: Hemodynamic data: LV 100/2, EDP 9 mmHg.  Ao 148/66, mean 95 mmHg.  No pressure gradient across the aortic valve. RA: 3/2, mean 2 mmHg. RV 37/2, EDP 5 mmHg. PA 36/15, mean 22 mmHg.  PA saturation 64%. PW 12/12, mean 10 mmHg.  Aortic saturation 94%. QP/QS 1.00. CO 4.3, CI 2.86 by Fick. Angiographic data: LM: Large-caliber vessel.  It smooth and normal. LAD: Large-caliber vessel, gives origin to a moderate-sized D1 and several small  diagonals.  It is small and normal. LCx: Large-caliber vessel giving origin to a very tiny OM1 and OM 2 and large OM 3.  Moderate sized AV groove circumflex.  The OM 3 has a focal 80% thrombotic lesion that appears to be organized.  There is TIMI-3 flow distally. RCA: Moderate caliber vessel, dominant, smooth and normal. Impression and recommendations: PA pressure upper limit of normal with low LVEDP, fluoroscopy reveals significant pulmonary parenchymal markings, will probably need follow-up. A focal thrombotic stenosis noted in the large OM 3 branch of CX. Will discontinue aspirin as patient does not have any significant coronary artery disease, focal thrombus appears to be probably related to recent COVID infection.  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  I have started the patient on Eliquis 5 mg p.o. twice daily, can be used for at least 3 months.  10 to 13 mL contrast utilized.   ECHOCARDIOGRAM COMPLETE  Result Date: 01/23/2023    ECHOCARDIOGRAM REPORT   Patient Name:   Nichole Johnson Date of Exam: 01/23/2023 Medical Rec #:  [id number]    Height:       59.0 in Accession #:    [id number]   Weight:       124.1 lb Date of Birth:  [birth year]    BSA:          1.506 m Patient Age:    22 years     BP:           85/64 mmHg Patient Gender: F            HR:           95 bpm. Exam Location:  Inpatient Procedure: 2D Echo, Color Doppler, Cardiac Doppler and Intracardiac            Opacification Agent Indications:    CHF  History:        Patient has prior history of Echocardiogram examinations, most                 recent 10/21/2022. CHF, CKD, Brease Cancer; Risk                 Factors:Dyslipidemia, Hypertension and Sleep Apnea.  Sonographer:  99  CO2 28  --   --  24  GLUCOSE 84  --   --  109*  BUN 31*  --   --  22  CREATININE 1.57*  --   --  1.22*  CALCIUM 8.9  --   --  9.1   < > = values in this interval not displayed.   Liver Function Tests No results for input(s): "AST", "ALT", "ALKPHOS", "BILITOT", "PROT", "ALBUMIN" in the last 72 hours. No results for input(s): "LIPASE", "AMYLASE" in the last 72 hours. High Sensitivity Troponin:   Recent Labs  Lab 01/21/23 1948 01/22/23 0136  TROPONINIHS 11,645* 8,773*   _____________  CARDIAC CATHETERIZATION  Result Date: 01/25/2023 Images from the original result were not included. Right & Left Heart Catheterization 01/25/23: Hemodynamic data: LV 100/2, EDP 9 mmHg.  Ao 148/66, mean 95 mmHg.  No pressure gradient across the aortic valve. RA: 3/2, mean 2 mmHg. RV 37/2, EDP 5 mmHg. PA 36/15, mean 22 mmHg.  PA saturation 64%. PW 12/12, mean 10 mmHg.  Aortic saturation 94%. QP/QS 1.00. CO 4.3, CI 2.86 by Fick. Angiographic data: LM: Large-caliber vessel.  It smooth and normal. LAD: Large-caliber vessel, gives origin to a moderate-sized D1 and several small  diagonals.  It is small and normal. LCx: Large-caliber vessel giving origin to a very tiny OM1 and OM 2 and large OM 3.  Moderate sized AV groove circumflex.  The OM 3 has a focal 80% thrombotic lesion that appears to be organized.  There is TIMI-3 flow distally. RCA: Moderate caliber vessel, dominant, smooth and normal. Impression and recommendations: PA pressure upper limit of normal with low LVEDP, fluoroscopy reveals significant pulmonary parenchymal markings, will probably need follow-up. A focal thrombotic stenosis noted in the large OM 3 branch of CX. Will discontinue aspirin as patient does not have any significant coronary artery disease, focal thrombus appears to be probably related to recent COVID infection.  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  I have started the patient on Eliquis 5 mg p.o. twice daily, can be used for at least 3 months.  10 to 13 mL contrast utilized.   ECHOCARDIOGRAM COMPLETE  Result Date: 01/23/2023    ECHOCARDIOGRAM REPORT   Patient Name:   Nichole Johnson Date of Exam: 01/23/2023 Medical Rec #:  [id number]    Height:       59.0 in Accession #:    [id number]   Weight:       124.1 lb Date of Birth:  [birth year]    BSA:          1.506 m Patient Age:    22 years     BP:           85/64 mmHg Patient Gender: F            HR:           95 bpm. Exam Location:  Inpatient Procedure: 2D Echo, Color Doppler, Cardiac Doppler and Intracardiac            Opacification Agent Indications:    CHF  History:        Patient has prior history of Echocardiogram examinations, most                 recent 10/21/2022. CHF, CKD, Brease Cancer; Risk                 Factors:Dyslipidemia, Hypertension and Sleep Apnea.  Sonographer:  99  CO2 28  --   --  24  GLUCOSE 84  --   --  109*  BUN 31*  --   --  22  CREATININE 1.57*  --   --  1.22*  CALCIUM 8.9  --   --  9.1   < > = values in this interval not displayed.   Liver Function Tests No results for input(s): "AST", "ALT", "ALKPHOS", "BILITOT", "PROT", "ALBUMIN" in the last 72 hours. No results for input(s): "LIPASE", "AMYLASE" in the last 72 hours. High Sensitivity Troponin:   Recent Labs  Lab 01/21/23 1948 01/22/23 0136  TROPONINIHS 11,645* 8,773*   _____________  CARDIAC CATHETERIZATION  Result Date: 01/25/2023 Images from the original result were not included. Right & Left Heart Catheterization 01/25/23: Hemodynamic data: LV 100/2, EDP 9 mmHg.  Ao 148/66, mean 95 mmHg.  No pressure gradient across the aortic valve. RA: 3/2, mean 2 mmHg. RV 37/2, EDP 5 mmHg. PA 36/15, mean 22 mmHg.  PA saturation 64%. PW 12/12, mean 10 mmHg.  Aortic saturation 94%. QP/QS 1.00. CO 4.3, CI 2.86 by Fick. Angiographic data: LM: Large-caliber vessel.  It smooth and normal. LAD: Large-caliber vessel, gives origin to a moderate-sized D1 and several small  diagonals.  It is small and normal. LCx: Large-caliber vessel giving origin to a very tiny OM1 and OM 2 and large OM 3.  Moderate sized AV groove circumflex.  The OM 3 has a focal 80% thrombotic lesion that appears to be organized.  There is TIMI-3 flow distally. RCA: Moderate caliber vessel, dominant, smooth and normal. Impression and recommendations: PA pressure upper limit of normal with low LVEDP, fluoroscopy reveals significant pulmonary parenchymal markings, will probably need follow-up. A focal thrombotic stenosis noted in the large OM 3 branch of CX. Will discontinue aspirin as patient does not have any significant coronary artery disease, focal thrombus appears to be probably related to recent COVID infection.  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  I have started the patient on Eliquis 5 mg p.o. twice daily, can be used for at least 3 months.  10 to 13 mL contrast utilized.   ECHOCARDIOGRAM COMPLETE  Result Date: 01/23/2023    ECHOCARDIOGRAM REPORT   Patient Name:   Nichole Johnson Date of Exam: 01/23/2023 Medical Rec #:  [id number]    Height:       59.0 in Accession #:    [id number]   Weight:       124.1 lb Date of Birth:  [birth year]    BSA:          1.506 m Patient Age:    22 years     BP:           85/64 mmHg Patient Gender: F            HR:           95 bpm. Exam Location:  Inpatient Procedure: 2D Echo, Color Doppler, Cardiac Doppler and Intracardiac            Opacification Agent Indications:    CHF  History:        Patient has prior history of Echocardiogram examinations, most                 recent 10/21/2022. CHF, CKD, Brease Cancer; Risk                 Factors:Dyslipidemia, Hypertension and Sleep Apnea.  Sonographer:  99  CO2 28  --   --  24  GLUCOSE 84  --   --  109*  BUN 31*  --   --  22  CREATININE 1.57*  --   --  1.22*  CALCIUM 8.9  --   --  9.1   < > = values in this interval not displayed.   Liver Function Tests No results for input(s): "AST", "ALT", "ALKPHOS", "BILITOT", "PROT", "ALBUMIN" in the last 72 hours. No results for input(s): "LIPASE", "AMYLASE" in the last 72 hours. High Sensitivity Troponin:   Recent Labs  Lab 01/21/23 1948 01/22/23 0136  TROPONINIHS 11,645* 8,773*   _____________  CARDIAC CATHETERIZATION  Result Date: 01/25/2023 Images from the original result were not included. Right & Left Heart Catheterization 01/25/23: Hemodynamic data: LV 100/2, EDP 9 mmHg.  Ao 148/66, mean 95 mmHg.  No pressure gradient across the aortic valve. RA: 3/2, mean 2 mmHg. RV 37/2, EDP 5 mmHg. PA 36/15, mean 22 mmHg.  PA saturation 64%. PW 12/12, mean 10 mmHg.  Aortic saturation 94%. QP/QS 1.00. CO 4.3, CI 2.86 by Fick. Angiographic data: LM: Large-caliber vessel.  It smooth and normal. LAD: Large-caliber vessel, gives origin to a moderate-sized D1 and several small  diagonals.  It is small and normal. LCx: Large-caliber vessel giving origin to a very tiny OM1 and OM 2 and large OM 3.  Moderate sized AV groove circumflex.  The OM 3 has a focal 80% thrombotic lesion that appears to be organized.  There is TIMI-3 flow distally. RCA: Moderate caliber vessel, dominant, smooth and normal. Impression and recommendations: PA pressure upper limit of normal with low LVEDP, fluoroscopy reveals significant pulmonary parenchymal markings, will probably need follow-up. A focal thrombotic stenosis noted in the large OM 3 branch of CX. Will discontinue aspirin as patient does not have any significant coronary artery disease, focal thrombus appears to be probably related to recent COVID infection.  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  I have started the patient on Eliquis 5 mg p.o. twice daily, can be used for at least 3 months.  10 to 13 mL contrast utilized.   ECHOCARDIOGRAM COMPLETE  Result Date: 01/23/2023    ECHOCARDIOGRAM REPORT   Patient Name:   Nichole Johnson Date of Exam: 01/23/2023 Medical Rec #:  [id number]    Height:       59.0 in Accession #:    [id number]   Weight:       124.1 lb Date of Birth:  [birth year]    BSA:          1.506 m Patient Age:    22 years     BP:           85/64 mmHg Patient Gender: F            HR:           95 bpm. Exam Location:  Inpatient Procedure: 2D Echo, Color Doppler, Cardiac Doppler and Intracardiac            Opacification Agent Indications:    CHF  History:        Patient has prior history of Echocardiogram examinations, most                 recent 10/21/2022. CHF, CKD, Brease Cancer; Risk                 Factors:Dyslipidemia, Hypertension and Sleep Apnea.  Sonographer:  Discharge Summary    Patient ID: Nichole Johnson MRN: [id number]; DOB: 05-Aug-1945  Admit date: 01/21/2023 Discharge date: 01/26/2023  PCP:  [name], MD   [facility] Providers Cardiologist:  Gypsy Balsam, MD        Discharge Diagnoses    Principal Problem:   Acute on chronic systolic (congestive) heart failure [facility]) Active Problems:   Hyperlipidemia   Hypertension   Stage 3a chronic kidney disease (HCC)   CAD (coronary artery disease)   Non-ST elevation (NSTEMI) myocardial infarction [facility])    Diagnostic Studies/Procedures    [facility] 01/25/23 Right & Left Heart Catheterization 01/25/23: Hemodynamic data: LV 100/2, EDP 9 mmHg.  Ao 148/66, mean 95 mmHg.  No pressure gradient across the aortic valve. RA: 3/2, mean 2 mmHg. RV 37/2, EDP 5 mmHg. PA 36/15, mean 22 mmHg.  PA saturation 64%. PW 12/12, mean 10 mmHg.  Aortic saturation 94%. QP/QS 1.00. CO 4.3, CI 2.86 by Fick.   Angiographic data: LM: Large-caliber vessel.  It smooth and normal. LAD: Large-caliber vessel, gives origin to a moderate-sized D1 and several small diagonals.  It is small and normal. LCx: Large-caliber vessel giving origin to a very tiny OM1 and OM 2 and large OM 3.  Moderate sized AV groove circumflex.  The OM 3 has a focal 80% thrombotic lesion that appears to be organized.  There is TIMI-3 flow distally. RCA: Moderate caliber vessel, dominant, smooth and normal.  Impression and recommendations: PA pressure upper limit of normal with low LVEDP, fluoroscopy reveals significant pulmonary parenchymal markings, will probably need follow-up. A focal thrombotic stenosis noted in the large OM 3 branch of CX.   Will discontinue aspirin as patient does not have any significant coronary artery disease, focal thrombus appears to be probably related to recent COVID infection.  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  I have started the  patient on Eliquis 5 mg p.o. twice daily, can be used for at least 3 months.  10 to 13 mL contrast utilized.   2D echo 01/23/23   1. Left ventricular ejection fraction, by estimation, is 25 to 30%. The  left ventricle has severely decreased function. The left ventricle  demonstrates global hypokinesis. The left ventricular internal cavity size  was dilated. Left ventricular diastolic   parameters are consistent with Grade III diastolic dysfunction  (restrictive). Elevated left atrial pressure.   2. Right ventricular systolic function is low normal. The right  ventricular size is normal. There is normal pulmonary artery systolic  pressure.   3. The mitral valve is degenerative. Moderate mitral valve regurgitation.  No evidence of mitral stenosis.   4. The aortic valve is tricuspid. Aortic valve regurgitation is not  visualized. No aortic stenosis is present.   5. The inferior vena cava is normal in size with <50% respiratory  variability, suggesting right atrial pressure of 8 mmHg.   Comparison(s): A prior study was performed on 10/21/2022. LVEF was  reported 30-35% and now 25-30%, Grade 3 diastolic dysfunction noted on  current study, otherwise no significant change.  _____________   History of Present Illness     Nichole Johnson is a 77 y.o. 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  She has been followed by Dr. Dulce Sellar then more recently Dr. Bing Matter. She remotely saw the Advanced HF clinic last in 2021.

## 2023-01-26 NOTE — Progress Notes (Signed)
CARDIAC REHAB PHASE I   Pt ambulated independently in room using front wheel walker. Tolerated well no CP, dizziness and no change in mild SOB that comes and goes. Returned to chair with call bell and bedside table in reach.  Post MI/HF education including restrictions, risk factors, exercise guidelines, HF booklet, MI booklet, heart healthy diet, low sodium and CRP2 reviewed. All questions and concerns addressed. Will refer to [facility] for CRP2. Plan for home later today.   [id number] Woodroe Chen, RN BSN 01/26/2023 12:00 PM

## 2023-01-26 NOTE — TOC Transition Note (Signed)
Transition of Care [facility]) - CM/SW Discharge Note   Patient Details  Name: Nichole Johnson MRN: [id number] Date of Birth: [birth year]  Transition [facility]) CM/SW Contact:  Leone Haven, RN Phone Number: 01/26/2023, 1:10 PM   Clinical Narrative:    For dc today , spouse at bedside to transport home. She is indep. Has no needs.   Final next level of care: Home/Self Care Barriers to Discharge: No Barriers Identified   Patient Goals and CMS Choice   Choice offered to / list presented to : NA  Discharge Placement                         Discharge Plan and Services Additional resources added to the After Visit Summary for   In-house Referral: NA Discharge Planning Services: CM Consult Post Acute Care Choice: NA          DME Arranged: N/A DME Agency: NA       HH Arranged: NA          Social Determinants of Health (SDOH) Interventions SDOH Screenings   Food Insecurity: No Food Insecurity (01/22/2023)  Housing: Low Risk  (01/22/2023)  Transportation Needs: No Transportation Needs (01/22/2023)  Utilities: Not At Risk (01/22/2023)  Social Connections: Unknown (06/27/2022)   Received from [facility]  Tobacco Use: Low Risk  (01/21/2023)     Readmission Risk Interventions     No data to display

## 2023-01-26 NOTE — Plan of Care (Signed)
  Problem: Education: Goal: Knowledge of General Education information will improve Description: Including pain rating scale, medication(s)/side effects and non-pharmacologic comfort measures Outcome: Progressing   Problem: Health Behavior/Discharge Planning: Goal: Ability to manage health-related needs will improve Outcome: Progressing   Problem: Clinical Measurements: Goal: Ability to maintain clinical measurements within normal limits will improve Outcome: Progressing Goal: Will remain free from infection Outcome: Progressing Goal: Diagnostic test results will improve Outcome: Progressing Goal: Respiratory complications will improve Outcome: Progressing Goal: Cardiovascular complication will be avoided Outcome: Progressing   Problem: Activity: Goal: Risk for activity intolerance will decrease Outcome: Progressing   Problem: Coping: Goal: Level of anxiety will decrease Outcome: Progressing   Problem: Education: Goal: Understanding of cardiac disease, CV risk reduction, and recovery process will improve Outcome: Progressing Goal: Individualized Educational Video(s) Outcome: Progressing   Problem: Activity: Goal: Ability to tolerate increased activity will improve Outcome: Progressing   Problem: Cardiac: Goal: Ability to achieve and maintain adequate cardiovascular perfusion will improve Outcome: Progressing   Problem: Health Behavior/Discharge Planning: Goal: Ability to safely manage health-related needs after discharge will improve Outcome: Progressing   Problem: Education: Goal: Understanding of CV disease, CV risk reduction, and recovery process will improve Outcome: Progressing Goal: Individualized Educational Video(s) Outcome: Progressing   Problem: Activity: Goal: Ability to return to baseline activity level will improve Outcome: Progressing   Problem: Cardiovascular: Goal: Ability to achieve and maintain adequate cardiovascular perfusion will  improve Outcome: Progressing Goal: Vascular access site(s) Level 0-1 will be maintained Outcome: Progressing   Problem: Health Behavior/Discharge Planning: Goal: Ability to safely manage health-related needs after discharge will improve Outcome: Progressing

## 2023-01-26 NOTE — Progress Notes (Signed)
Pt discharge instructions given with IV removed. Husband transporting home will pick up meds from [facility] in [place].

## 2023-01-26 NOTE — Telephone Encounter (Signed)
Received msg from floor nurse that patient called hospital back and husband reported atorvastatin was not at the pharmacy for pickup. I called [facility] and confirmed they did receive the prescription as it was sent in and it is ready for pickup. Nurse relayed this back to the pt/husband.

## 2023-01-26 NOTE — Progress Notes (Signed)
Progress Note  Patient Name: Nichole Johnson Date of Encounter: 01/26/2023  Primary Cardiologist:   Norman Herrlich, MD   Subjective   She is having some episodes of breathlessness but overall feels well.  Denies. Chest pain.  No wrist pain.   Inpatient Medications    Scheduled Meds:  atorvastatin  40 mg Oral Daily   levothyroxine  100 mcg Oral Q0600   metoprolol succinate  25 mg Oral Daily   sodium chloride flush  3 mL Intravenous Q12H   Continuous Infusions:   PRN Meds: acetaminophen, nitroGLYCERIN, ondansetron (ZOFRAN) IV, sodium chloride flush   Vital Signs    Vitals:   01/26/23 0026 01/26/23 0027 01/26/23 0355 01/26/23 0757  BP: 100/70  95/62 112/75  Pulse: 86  86 96  Resp: 18  18 18   Temp: (!) 97.5 F (36.4 C)  97.6 F (36.4 C) 98.5 F (36.9 C)  TempSrc: Oral  Oral Oral  SpO2: 95%  96% 98%  Weight:  56 kg    Height:        Intake/Output Summary (Last 24 hours) at 01/26/2023 0901 Last data filed at 01/26/2023 0853 Gross per 24 hour  Intake 478 ml  Output 200 ml  Net 278 ml   Filed Weights   01/23/23 0400 01/24/23 0446 01/26/23 0027  Weight: 56.3 kg 55.6 kg 56 kg    Telemetry    NSR - Personally Reviewed  ECG    NA - Personally Reviewed  Physical Exam   GEN: No  acute distress.   Neck: No  JVD Cardiac: RRR, no murmurs, rubs, or gallops.  Respiratory: Clear   to auscultation bilaterally. GI: Soft, nontender, non-distended, normal bowel sounds  MS:  No edema; No deformity.  Right wrist without bleeding or bruising Neuro:   Nonfocal  Psych: Oriented and appropriate   Labs    Chemistry Recent Labs  Lab 01/24/23 0352 01/25/23 0249 01/25/23 1353 01/25/23 1354 01/25/23 1408 01/26/23 0408  NA 137 140   < > 139 137 136  K 3.4* 4.2   < > 3.5 3.4* 4.0  CL 98 101  --   --   --  99  CO2 26 28  --   --   --  24  GLUCOSE 97 84  --   --   --  109*  BUN 35* 31*  --   --   --  22  CREATININE 1.26* 1.57*  --   --   --  1.22*  CALCIUM 8.7*  8.9  --   --   --  9.1  GFRNONAA 43* 33*  --   --   --  45*  ANIONGAP 13 11  --   --   --  13   < > = values in this interval not displayed.     Hematology Recent Labs  Lab 01/24/23 0352 01/25/23 0249 01/25/23 1353 01/25/23 1354 01/25/23 1408 01/26/23 0408  WBC 7.5 8.0  --   --   --  7.3  RBC 4.22 4.19  --   --   --  4.25  HGB 11.6* 11.4*   < > 12.6 12.2 11.4*  HCT 35.5* 35.4*   < > 37.0 36.0 36.1  MCV 84.1 84.5  --   --   --  84.9  MCH 27.5 27.2  --   --   --  26.8  MCHC 32.7 32.2  --   --   --  31.6  RDW 16.6*  16.8*  --   --   --  16.7*  PLT 245 241  --   --   --  250   < > = values in this interval not displayed.    Cardiac EnzymesNo results for input(s): "TROPONINI" in the last 168 hours. No results for input(s): "TROPIPOC" in the last 168 hours.   BNP Recent Labs  Lab 01/21/23 1948  BNP 3,090.9*     DDimer No results for input(s): "DDIMER" in the last 168 hours.   Radiology    CARDIAC CATHETERIZATION  Result Date: 01/25/2023 Images from the original result were not included. Right & Left Heart Catheterization 01/25/23: Hemodynamic data: LV 100/2, EDP 9 mmHg.  Ao 148/66, mean 95 mmHg.  No pressure gradient across the aortic valve. RA: 3/2, mean 2 mmHg. RV 37/2, EDP 5 mmHg. PA 36/15, mean 22 mmHg.  PA saturation 64%. PW 12/12, mean 10 mmHg.  Aortic saturation 94%. QP/QS 1.00. CO 4.3, CI 2.86 by Fick. Angiographic data: LM: Large-caliber vessel.  It smooth and normal. LAD: Large-caliber vessel, gives origin to a moderate-sized D1 and several small diagonals.  It is small and normal. LCx: Large-caliber vessel giving origin to a very tiny OM1 and OM 2 and large OM 3.  Moderate sized AV groove circumflex.  The OM 3 has a focal 80% thrombotic lesion that appears to be organized.  There is TIMI-3 flow distally. RCA: Moderate caliber vessel, dominant, smooth and normal. Impression and recommendations: PA pressure upper limit of normal with low LVEDP, fluoroscopy reveals  significant pulmonary parenchymal markings, will probably need follow-up. A focal thrombotic stenosis noted in the large OM 3 branch of CX. Will discontinue aspirin as patient does not have any significant coronary artery disease, focal thrombus appears to be probably related to recent COVID infection.  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  I have started the patient on Eliquis 5 mg p.o. twice daily, can be used for at least 3 months.  10 to 13 mL contrast utilized.    Cardiac Studies   Echo:    01/23/23   1. Left ventricular ejection fraction, by estimation, is 25 to 30%. The  left ventricle has severely decreased function. The left ventricle  demonstrates global hypokinesis. The left ventricular internal cavity size  was dilated. Left ventricular diastolic   parameters are consistent with Grade III diastolic dysfunction  (restrictive). Elevated left atrial pressure.   2. Right ventricular systolic function is low normal. The right  ventricular size is normal. There is normal pulmonary artery systolic  pressure.   3. The mitral valve is degenerative. Moderate mitral valve regurgitation.  No evidence of mitral stenosis.   4. The aortic valve is tricuspid. Aortic valve regurgitation is not  visualized. No aortic stenosis is present.   5. The inferior vena cava is normal in size with <50% respiratory  variability, suggesting right atrial pressure of 8 mmHg.    Cath 01/25/23: See above.  Patient Profile     77 y.o. female with chronic systolic heart failure thought to be due to chemotherapy, history of breast cancer, hypertension, CKD who is being seen 01/22/2023 for the evaluation of CHF, elevated troponins   Assessment & Plan    Acute on chronic systolic HF with NSTEMI:   Non ischemic.  There is obstructive disease in an OM but not the cause of the markedly decreased LV function.    RV pressures and EDP are OK.  She has  not tolerated med titration but they are willing to restart [place].    NSTEMI:    Trop peak this admission greater than 11K.  Likely related to the circ lesions.  Medical management with risk reduction.   CKDIIIa:   Creat is OK.  Holding off on titrating ARNi, spiro.    Start [place] as above.   OK to discharge later today with follow up in a week in the Adv[facility]aced HF Clinic.    For questions or updates, please contact [facility] Please consult [email or web address] for contact info under Cardiology/STEMI.   Signed, Rollene Rotunda, MD  01/26/2023, 9:01 AM

## 2023-01-29 ENCOUNTER — Telehealth (HOSPITAL_COMMUNITY): Payer: Self-pay

## 2023-01-29 NOTE — Telephone Encounter (Signed)
Per phase I cardiac rehab, fax referral to [place]. 

## 2023-02-01 ENCOUNTER — Encounter (HOSPITAL_COMMUNITY): Payer: Medicare HMO | Admitting: Internal Medicine

## 2023-02-02 ENCOUNTER — Other Ambulatory Visit: Payer: Self-pay

## 2023-02-02 ENCOUNTER — Inpatient Hospital Stay (HOSPITAL_COMMUNITY)
Admission: EM | Admit: 2023-02-02 | Discharge: 2023-02-09 | DRG: 280 | Disposition: A | Discharge status: Home Health | Payer: Medicare HMO | Attending: Internal Medicine | Admitting: Internal Medicine

## 2023-02-02 ENCOUNTER — Telehealth: Payer: Self-pay | Admitting: Cardiology

## 2023-02-02 ENCOUNTER — Emergency Department (HOSPITAL_COMMUNITY): Payer: Medicare HMO

## 2023-02-02 DIAGNOSIS — I130 Hypertensive heart and chronic kidney disease with heart failure and stage 1 through stage 4 chronic kidney disease, or unspecified chronic kidney disease: Principal | ICD-10-CM | POA: Diagnosis present

## 2023-02-02 DIAGNOSIS — H409 Unspecified glaucoma: Secondary | ICD-10-CM | POA: Diagnosis present

## 2023-02-02 DIAGNOSIS — Z881 Allergy status to other antibiotic agents status: Secondary | ICD-10-CM

## 2023-02-02 DIAGNOSIS — Z79890 Hormone replacement therapy (postmenopausal): Secondary | ICD-10-CM

## 2023-02-02 DIAGNOSIS — E7800 Pure hypercholesterolemia, unspecified: Secondary | ICD-10-CM | POA: Diagnosis present

## 2023-02-02 DIAGNOSIS — G4733 Obstructive sleep apnea (adult) (pediatric): Secondary | ICD-10-CM | POA: Diagnosis present

## 2023-02-02 DIAGNOSIS — I420 Dilated cardiomyopathy: Secondary | ICD-10-CM | POA: Diagnosis present

## 2023-02-02 DIAGNOSIS — M810 Age-related osteoporosis without current pathological fracture: Secondary | ICD-10-CM

## 2023-02-02 DIAGNOSIS — J4540 Moderate persistent asthma, uncomplicated: Secondary | ICD-10-CM | POA: Diagnosis present

## 2023-02-02 DIAGNOSIS — R0989 Other specified symptoms and signs involving the circulatory and respiratory systems: Secondary | ICD-10-CM | POA: Diagnosis present

## 2023-02-02 DIAGNOSIS — U099 Post covid-19 condition, unspecified: Secondary | ICD-10-CM | POA: Diagnosis present

## 2023-02-02 DIAGNOSIS — C50912 Malignant neoplasm of unspecified site of left female breast: Secondary | ICD-10-CM

## 2023-02-02 DIAGNOSIS — I340 Nonrheumatic mitral (valve) insufficiency: Secondary | ICD-10-CM | POA: Diagnosis present

## 2023-02-02 DIAGNOSIS — Z7901 Long term (current) use of anticoagulants: Secondary | ICD-10-CM

## 2023-02-02 DIAGNOSIS — Z79899 Other long term (current) drug therapy: Secondary | ICD-10-CM

## 2023-02-02 DIAGNOSIS — I2510 Atherosclerotic heart disease of native coronary artery without angina pectoris: Secondary | ICD-10-CM | POA: Diagnosis present

## 2023-02-02 DIAGNOSIS — I214 Non-ST elevation (NSTEMI) myocardial infarction: Secondary | ICD-10-CM | POA: Diagnosis present

## 2023-02-02 DIAGNOSIS — Z923 Personal history of irradiation: Secondary | ICD-10-CM

## 2023-02-02 DIAGNOSIS — I9589 Other hypotension: Secondary | ICD-10-CM | POA: Diagnosis present

## 2023-02-02 DIAGNOSIS — R058 Other specified cough: Secondary | ICD-10-CM

## 2023-02-02 DIAGNOSIS — N179 Acute kidney failure, unspecified: Secondary | ICD-10-CM | POA: Diagnosis not present

## 2023-02-02 DIAGNOSIS — I5043 Acute on chronic combined systolic (congestive) and diastolic (congestive) heart failure: Secondary | ICD-10-CM | POA: Diagnosis present

## 2023-02-02 DIAGNOSIS — Z853 Personal history of malignant neoplasm of breast: Secondary | ICD-10-CM

## 2023-02-02 DIAGNOSIS — E876 Hypokalemia: Secondary | ICD-10-CM | POA: Diagnosis not present

## 2023-02-02 DIAGNOSIS — E872 Acidosis, unspecified: Secondary | ICD-10-CM | POA: Diagnosis present

## 2023-02-02 DIAGNOSIS — I493 Ventricular premature depolarization: Secondary | ICD-10-CM | POA: Diagnosis not present

## 2023-02-02 DIAGNOSIS — Z7983 Long term (current) use of bisphosphonates: Secondary | ICD-10-CM

## 2023-02-02 DIAGNOSIS — Z8249 Family history of ischemic heart disease and other diseases of the circulatory system: Secondary | ICD-10-CM

## 2023-02-02 DIAGNOSIS — I400 Infective myocarditis: Secondary | ICD-10-CM | POA: Diagnosis present

## 2023-02-02 DIAGNOSIS — Z96653 Presence of artificial knee joint, bilateral: Secondary | ICD-10-CM | POA: Diagnosis present

## 2023-02-02 DIAGNOSIS — N1832 Chronic kidney disease, stage 3b: Secondary | ICD-10-CM | POA: Diagnosis present

## 2023-02-02 DIAGNOSIS — E039 Hypothyroidism, unspecified: Secondary | ICD-10-CM | POA: Diagnosis present

## 2023-02-02 DIAGNOSIS — Z882 Allergy status to sulfonamides status: Secondary | ICD-10-CM

## 2023-02-02 DIAGNOSIS — Z7982 Long term (current) use of aspirin: Secondary | ICD-10-CM

## 2023-02-02 DIAGNOSIS — Z885 Allergy status to narcotic agent status: Secondary | ICD-10-CM

## 2023-02-02 DIAGNOSIS — Z880 Allergy status to penicillin: Secondary | ICD-10-CM

## 2023-02-02 LAB — CBC
HCT: 39.6 % (ref 36.0–46.0)
Hemoglobin: 12.5 g/dL (ref 12.0–15.0)
MCH: 27.7 pg (ref 26.0–34.0)
MCHC: 31.6 g/dL (ref 30.0–36.0)
MCV: 87.8 fL (ref 80.0–100.0)
Platelets: 259 10*3/uL (ref 150–400)
RBC: 4.51 MIL/uL (ref 3.87–5.11)
RDW: 16.6 % — ABNORMAL HIGH (ref 11.5–15.5)
WBC: 9.9 10*3/uL (ref 4.0–10.5)
nRBC: 0 % (ref 0.0–0.2)

## 2023-02-02 LAB — BRAIN NATRIURETIC PEPTIDE: B Natriuretic Peptide: 3869.9 pg/mL — ABNORMAL HIGH (ref 0.0–100.0)

## 2023-02-02 LAB — BASIC METABOLIC PANEL
Anion gap: 17 — ABNORMAL HIGH (ref 5–15)
BUN: 46 mg/dL — ABNORMAL HIGH (ref 8–23)
CO2: 20 mmol/L — ABNORMAL LOW (ref 22–32)
Calcium: 9.4 mg/dL (ref 8.9–10.3)
Chloride: 95 mmol/L — ABNORMAL LOW (ref 98–111)
Creatinine, Ser: 1.64 mg/dL — ABNORMAL HIGH (ref 0.44–1.00)
GFR, Estimated: 32 mL/min — ABNORMAL LOW (ref >60)
Glucose, Bld: 112 mg/dL — ABNORMAL HIGH (ref 70–99)
Potassium: 4 mmol/L (ref 3.5–5.1)
Sodium: 132 mmol/L — ABNORMAL LOW (ref 135–145)

## 2023-02-02 LAB — TROPONIN I (HIGH SENSITIVITY)
Troponin I (High Sensitivity): 265 ng/L (ref <18)
Troponin I (High Sensitivity): 239 ng/L (ref <18)

## 2023-02-02 NOTE — Telephone Encounter (Signed)
LVM for pt to stop Marcelline Deist for a few days to see how she feels.

## 2023-02-02 NOTE — Telephone Encounter (Signed)
Pt c/o Shortness Of Breath: STAT if SOB developed within the last 24 hours or pt is noticeably SOB on the phone  1. Are you currently SOB (can you hear that pt is SOB on the phone)?   No  2. How long have you been experiencing SOB?   Patient stated this morning her SOB has been increasing since she started on dapagliflozin propanediol (FARXIGA) 10 MG TABS tablet   3. Are you SOB when sitting or when up moving around?   Both  4. Are you currently experiencing any other symptoms?  Lightheadedness, nausea  Patient/husband stated patient recently started on dapagliflozin propanediol (FARXIGA) 10 MG TABS tablet and had SOB this morning after a short walk (20 feet) which has been coming and going.

## 2023-02-02 NOTE — ED Triage Notes (Signed)
Patient arrives for eval of shortness of breath. Recently discharged from admission for CHF and NSTEMI. Having orthopnea at home since discharge but has progressed to DOE and sob at rest. Compliant with diuretic.

## 2023-02-02 NOTE — ED Provider Triage Note (Signed)
Emergency Medicine Provider Triage Evaluation Note  Nichole Johnson , a 77 y.o. female  was evaluated in triage.  Pt complains of SHOB, worse with exertion and lying supine. [place] abroad[place] for COVID/P[place] and was admitted for NSTEMI, dc Monday. Hx CHF, weights stable.  Review of Systems  Positive:  Negative:   Physical Exam  BP 104/75 (BP Location: Right Arm)   Pulse 83   Temp 97.6 F (36.4 C)   Resp 16   SpO2 100%  Gen:   Awake, no distress   Resp:  Normal effort  MSK:   Moves extremities without difficulty  Other:  No sig lower ext edema  Medical Decision Making  Medically screening exam initiated at 5:44 PM.  Appropriate orders placed.  Nichole Johnson was informed that the remainder of the evaluation will be completed by another provider, this initial triage assessment does not replace that evaluation, and the importance of remaining in the ED until their evaluation is complete.     Jeannie Fend, PA-C 02/02/23 1750

## 2023-02-03 ENCOUNTER — Other Ambulatory Visit (HOSPITAL_COMMUNITY): Payer: Self-pay

## 2023-02-03 ENCOUNTER — Encounter (HOSPITAL_COMMUNITY): Payer: Self-pay | Admitting: Internal Medicine

## 2023-02-03 DIAGNOSIS — I5021 Acute systolic (congestive) heart failure: Secondary | ICD-10-CM | POA: Diagnosis not present

## 2023-02-03 DIAGNOSIS — H409 Unspecified glaucoma: Secondary | ICD-10-CM | POA: Diagnosis present

## 2023-02-03 DIAGNOSIS — E039 Hypothyroidism, unspecified: Secondary | ICD-10-CM | POA: Diagnosis present

## 2023-02-03 DIAGNOSIS — Z79899 Other long term (current) drug therapy: Secondary | ICD-10-CM | POA: Diagnosis not present

## 2023-02-03 DIAGNOSIS — N1832 Chronic kidney disease, stage 3b: Secondary | ICD-10-CM | POA: Diagnosis present

## 2023-02-03 DIAGNOSIS — Z923 Personal history of irradiation: Secondary | ICD-10-CM | POA: Diagnosis not present

## 2023-02-03 DIAGNOSIS — Z96653 Presence of artificial knee joint, bilateral: Secondary | ICD-10-CM | POA: Diagnosis present

## 2023-02-03 DIAGNOSIS — Z881 Allergy status to other antibiotic agents status: Secondary | ICD-10-CM | POA: Diagnosis not present

## 2023-02-03 DIAGNOSIS — Z8249 Family history of ischemic heart disease and other diseases of the circulatory system: Secondary | ICD-10-CM | POA: Diagnosis not present

## 2023-02-03 DIAGNOSIS — Z79890 Hormone replacement therapy (postmenopausal): Secondary | ICD-10-CM | POA: Diagnosis not present

## 2023-02-03 DIAGNOSIS — N179 Acute kidney failure, unspecified: Secondary | ICD-10-CM | POA: Diagnosis present

## 2023-02-03 DIAGNOSIS — I2510 Atherosclerotic heart disease of native coronary artery without angina pectoris: Secondary | ICD-10-CM | POA: Diagnosis present

## 2023-02-03 DIAGNOSIS — I214 Non-ST elevation (NSTEMI) myocardial infarction: Secondary | ICD-10-CM | POA: Diagnosis present

## 2023-02-03 DIAGNOSIS — I340 Nonrheumatic mitral (valve) insufficiency: Secondary | ICD-10-CM | POA: Diagnosis present

## 2023-02-03 DIAGNOSIS — I420 Dilated cardiomyopathy: Secondary | ICD-10-CM | POA: Diagnosis present

## 2023-02-03 DIAGNOSIS — E876 Hypokalemia: Secondary | ICD-10-CM | POA: Diagnosis not present

## 2023-02-03 DIAGNOSIS — G4733 Obstructive sleep apnea (adult) (pediatric): Secondary | ICD-10-CM | POA: Diagnosis present

## 2023-02-03 DIAGNOSIS — I5043 Acute on chronic combined systolic (congestive) and diastolic (congestive) heart failure: Secondary | ICD-10-CM | POA: Diagnosis present

## 2023-02-03 DIAGNOSIS — J4540 Moderate persistent asthma, uncomplicated: Secondary | ICD-10-CM | POA: Diagnosis present

## 2023-02-03 DIAGNOSIS — Z853 Personal history of malignant neoplasm of breast: Secondary | ICD-10-CM | POA: Diagnosis not present

## 2023-02-03 DIAGNOSIS — I9589 Other hypotension: Secondary | ICD-10-CM | POA: Diagnosis present

## 2023-02-03 DIAGNOSIS — E7800 Pure hypercholesterolemia, unspecified: Secondary | ICD-10-CM | POA: Diagnosis present

## 2023-02-03 DIAGNOSIS — I5023 Acute on chronic systolic (congestive) heart failure: Secondary | ICD-10-CM | POA: Diagnosis not present

## 2023-02-03 DIAGNOSIS — I400 Infective myocarditis: Secondary | ICD-10-CM | POA: Diagnosis present

## 2023-02-03 DIAGNOSIS — I130 Hypertensive heart and chronic kidney disease with heart failure and stage 1 through stage 4 chronic kidney disease, or unspecified chronic kidney disease: Secondary | ICD-10-CM | POA: Diagnosis present

## 2023-02-03 DIAGNOSIS — E872 Acidosis, unspecified: Secondary | ICD-10-CM | POA: Diagnosis present

## 2023-02-03 LAB — CBC
HCT: 38.4 % (ref 36.0–46.0)
Hemoglobin: 12.8 g/dL (ref 12.0–15.0)
MCH: 27.5 pg (ref 26.0–34.0)
MCHC: 33.3 g/dL (ref 30.0–36.0)
MCV: 82.4 fL (ref 80.0–100.0)
Platelets: 229 10*3/uL (ref 150–400)
RBC: 4.66 MIL/uL (ref 3.87–5.11)
RDW: 16 % — ABNORMAL HIGH (ref 11.5–15.5)
WBC: 9.9 10*3/uL (ref 4.0–10.5)
nRBC: 0.2 % (ref 0.0–0.2)

## 2023-02-03 LAB — PHOSPHORUS: Phosphorus: 5 mg/dL — ABNORMAL HIGH (ref 2.5–4.6)

## 2023-02-03 LAB — BASIC METABOLIC PANEL
Anion gap: 18 — ABNORMAL HIGH (ref 5–15)
BUN: 45 mg/dL — ABNORMAL HIGH (ref 8–23)
CO2: 22 mmol/L (ref 22–32)
Calcium: 9.5 mg/dL (ref 8.9–10.3)
Chloride: 93 mmol/L — ABNORMAL LOW (ref 98–111)
Creatinine, Ser: 1.49 mg/dL — ABNORMAL HIGH (ref 0.44–1.00)
GFR, Estimated: 36 mL/min — ABNORMAL LOW (ref >60)
Glucose, Bld: 125 mg/dL — ABNORMAL HIGH (ref 70–99)
Potassium: 3.1 mmol/L — ABNORMAL LOW (ref 3.5–5.1)
Sodium: 133 mmol/L — ABNORMAL LOW (ref 135–145)

## 2023-02-03 LAB — MAGNESIUM: Magnesium: 2.5 mg/dL — ABNORMAL HIGH (ref 1.7–2.4)

## 2023-02-03 MED ORDER — POTASSIUM CHLORIDE CRYS ER 20 MEQ PO TBCR
30.0000 meq | EXTENDED_RELEASE_TABLET | ORAL | Status: AC
  Administered 2023-02-03 (×3): 30 meq via ORAL
  Filled 2023-02-03 (×3): qty 1

## 2023-02-03 MED ORDER — FUROSEMIDE 10 MG/ML IJ SOLN
80.0000 mg | Freq: Once | INTRAMUSCULAR | Status: AC
  Administered 2023-02-03: 80 mg via INTRAVENOUS
  Filled 2023-02-03: qty 8

## 2023-02-03 MED ORDER — BUDESONIDE 0.25 MG/2ML IN SUSP
0.2500 mg | Freq: Two times a day (BID) | RESPIRATORY_TRACT | Status: DC
  Administered 2023-02-03 – 2023-02-09 (×11): 0.25 mg via RESPIRATORY_TRACT
  Filled 2023-02-03 (×12): qty 2

## 2023-02-03 MED ORDER — LATANOPROST 0.005 % OP SOLN
1.0000 [drp] | Freq: Every day | OPHTHALMIC | Status: DC
  Administered 2023-02-03 – 2023-02-08 (×6): 1 [drp] via OPHTHALMIC
  Filled 2023-02-03 (×2): qty 2.5

## 2023-02-03 MED ORDER — POLYETHYLENE GLYCOL 3350 17 G PO PACK
17.0000 g | PACK | Freq: Every day | ORAL | Status: DC | PRN
  Administered 2023-02-05: 17 g via ORAL
  Filled 2023-02-03: qty 1

## 2023-02-03 MED ORDER — APIXABAN 5 MG PO TABS: 5.0000 mg | ORAL_TABLET | Freq: Two times a day (BID) | ORAL | Status: DC

## 2023-02-03 MED ORDER — LORAZEPAM 0.5 MG PO TABS
0.5000 mg | ORAL_TABLET | Freq: Once | ORAL | Status: AC | PRN
  Administered 2023-02-03: 0.5 mg via ORAL
  Filled 2023-02-03: qty 1

## 2023-02-03 MED ORDER — ALBUTEROL SULFATE (2.5 MG/3ML) 0.083% IN NEBU
2.5000 mg | INHALATION_SOLUTION | Freq: Four times a day (QID) | RESPIRATORY_TRACT | Status: DC | PRN
  Filled 2023-02-03: qty 3

## 2023-02-03 MED ORDER — VITAMIN C 500 MG PO TABS
500.0000 mg | ORAL_TABLET | Freq: Every day | ORAL | Status: DC
  Administered 2023-02-03 – 2023-02-09 (×7): 500 mg via ORAL
  Filled 2023-02-03 (×7): qty 1

## 2023-02-03 MED ORDER — PROCHLORPERAZINE EDISYLATE 10 MG/2ML IJ SOLN: 5.0000 mg | Freq: Four times a day (QID) | INTRAMUSCULAR | Status: DC | PRN

## 2023-02-03 MED ORDER — DAPAGLIFLOZIN PROPANEDIOL 10 MG PO TABS
10.0000 mg | ORAL_TABLET | Freq: Every day | ORAL | Status: DC
  Administered 2023-02-03 – 2023-02-09 (×7): 10 mg via ORAL
  Filled 2023-02-03 (×8): qty 1

## 2023-02-03 MED ORDER — ACETAMINOPHEN 325 MG PO TABS: 650.0000 mg | ORAL_TABLET | Freq: Four times a day (QID) | ORAL | Status: DC | PRN

## 2023-02-03 MED ORDER — LEVOTHYROXINE SODIUM 100 MCG PO TABS
100.0000 ug | ORAL_TABLET | Freq: Every day | ORAL | Status: DC
  Administered 2023-02-03 – 2023-02-09 (×7): 100 ug via ORAL
  Filled 2023-02-03 (×7): qty 1

## 2023-02-03 MED ORDER — ASPIRIN 81 MG PO TBEC: 81.0000 mg | DELAYED_RELEASE_TABLET | Freq: Every morning | ORAL | Status: DC

## 2023-02-03 MED ORDER — BRINZOLAMIDE 1 % OP SUSP
1.0000 [drp] | Freq: Two times a day (BID) | OPHTHALMIC | Status: DC
  Administered 2023-02-04 – 2023-02-09 (×11): 1 [drp] via OPHTHALMIC
  Filled 2023-02-03: qty 10

## 2023-02-03 MED ORDER — POTASSIUM CHLORIDE ER 10 MEQ PO TBCR
40.0000 meq | EXTENDED_RELEASE_TABLET | Freq: Every day | ORAL | Status: DC
  Filled 2023-02-03: qty 4

## 2023-02-03 MED ORDER — FUROSEMIDE 10 MG/ML IJ SOLN
80.0000 mg | Freq: Two times a day (BID) | INTRAMUSCULAR | Status: DC
  Administered 2023-02-03 – 2023-02-04 (×3): 80 mg via INTRAVENOUS
  Filled 2023-02-03 (×3): qty 8

## 2023-02-03 MED ORDER — APIXABAN 5 MG PO TABS
5.0000 mg | ORAL_TABLET | Freq: Two times a day (BID) | ORAL | Status: DC
  Administered 2023-02-03 – 2023-02-09 (×13): 5 mg via ORAL
  Filled 2023-02-03 (×13): qty 1

## 2023-02-03 MED ORDER — FERROUS SULFATE 325 (65 FE) MG PO TABS: 325.0000 mg | ORAL_TABLET | Freq: Every day | ORAL | Status: DC

## 2023-02-03 MED ORDER — ENOXAPARIN SODIUM 40 MG/0.4ML IJ SOSY: 40.0000 mg | PREFILLED_SYRINGE | INTRAMUSCULAR | Status: DC

## 2023-02-03 MED ORDER — POTASSIUM CHLORIDE 10 MEQ/100ML IV SOLN
10.0000 meq | INTRAVENOUS | Status: DC
  Administered 2023-02-03: 10 meq via INTRAVENOUS
  Filled 2023-02-03 (×2): qty 100

## 2023-02-03 MED ORDER — METOPROLOL SUCCINATE ER 25 MG PO TB24
25.0000 mg | ORAL_TABLET | Freq: Every day | ORAL | Status: DC
  Administered 2023-02-03 – 2023-02-09 (×7): 25 mg via ORAL
  Filled 2023-02-03 (×7): qty 1

## 2023-02-03 MED ORDER — FLUTICASONE FUROATE 100 MCG/ACT IN AEPB
1.0000 | INHALATION_SPRAY | Freq: Every day | RESPIRATORY_TRACT | Status: DC
  Administered 2023-02-03: 1 via RESPIRATORY_TRACT

## 2023-02-03 MED ORDER — MELATONIN 5 MG PO TABS: 5.0000 mg | ORAL_TABLET | Freq: Every evening | ORAL | Status: DC | PRN

## 2023-02-03 MED ORDER — FLUTICASONE PROPIONATE 50 MCG/ACT NA SUSP
2.0000 | Freq: Every day | NASAL | Status: DC
  Administered 2023-02-05 – 2023-02-09 (×5): 2 via NASAL
  Filled 2023-02-03 (×2): qty 16

## 2023-02-03 MED ORDER — VITAMIN B-12 1000 MCG PO TABS
1000.0000 ug | ORAL_TABLET | Freq: Every day | ORAL | Status: DC
  Administered 2023-02-03 – 2023-02-09 (×7): 1000 ug via ORAL
  Filled 2023-02-03 (×7): qty 1

## 2023-02-03 MED ORDER — ATORVASTATIN CALCIUM 40 MG PO TABS
40.0000 mg | ORAL_TABLET | Freq: Every day | ORAL | Status: DC
  Administered 2023-02-03 – 2023-02-09 (×7): 40 mg via ORAL
  Filled 2023-02-03 (×7): qty 1

## 2023-02-03 NOTE — ED Provider Notes (Signed)
[facility]ENCY DEPT [facility] Emergency Department Provider Note MRN:  [id number]  Arrival date & time: 02/03/23     Chief Complaint   Shortness of Breath   History of Present Illness   Nichole Johnson is a 77 y.o. year-old female with a history of CHF presenting to the ED with chief complaint of shortness of breath.  Shortness of breath this evening, moderate to severe after only taking a few steps.  Has been struggling with new heart or lung issues for the past month or 2.  Denies chest pain this evening, no fever, mild dry cough.  Review of Systems  A thorough review of systems was obtained and all systems are negative except as noted in the HPI and PMH.   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 Pt was given copy of recent ECHO report.  Last Assessment & Plan:  Pt will f/u w/ Cardiology re this. Pt was given copy of recent ECHO report.     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 Added automatically from request for surgery [id number] Formatting of this note might be different from the original. Added automatically from request for surgery [id number]   Osteopenia of hip 04/21/2020   Formatting of this note might be different from the original. DEXA 06/2017: Lumbar T score -1.4, right total femur -1.4. She restarted alendronate since 05/2019. Formatting of this note might be different from the original. DEXA 06/2017: Lumbar T score -1.4, right total femur -1.4. She restarted alendronate since 05/2019.   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 Start using Flonase daily. Stop using OTC Afrin Formatting of this note might be different from the original. Last Assessment & Plan: Start using Flonase daily. Stop using OTC Afrin Formatting of this note might be different from the original. Overview: Last Assessment & Plan: Start using Flonase daily. Stop usin   Stage 3a chronic kidney disease (HCC) 04/01/2021   Trigger finger of left hand 07/26/2018    Past Surgical History:  Procedure Laterality Date   BREAST LUMPECTOMY Left 2001   CESAREAN SECTION  RIGHT/LEFT HEART CATH AND CORONARY ANGIOGRAPHY N/A 01/25/2023   Procedure: RIGHT/LEFT HEART CATH AND CORONARY ANGIOGRAPHY;  Surgeon: Yates Decamp, MD;  Location: [facility] INVASIVE CV LAB;  Service: Cardiovascular;  Laterality: N/A;   ROTATOR CUFF REPAIR Bilateral    SHOULDER CLOSED REDUCTION      Family History  Problem Relation Age of Onset   Hypertension Mother    Heart attack Neg Hx    Stroke  Neg Hx    Diabetes Neg Hx     Social History   Socioeconomic History   Marital status: Married    Spouse name: Not on file   Number of children: Not on file   Years of education: Not on file   Highest education level: Not on file  Occupational History   Not on file  Tobacco Use   Smoking status: Never    Passive exposure: Never   Smokeless tobacco: Never   Tobacco comments:    NEVER USED TOBACCO  Vaping Use   Vaping status: Never Used  Substance and Sexual Activity   Alcohol use: Yes    Alcohol/week: 0.0 standard drinks of alcohol    Comment: occ   Drug use: No   Sexual activity: Not on file  Other Topics Concern   Not on file  Social History Narrative   Not on file   Social Determinants of Health   Financial Resource Strain: Not on file  Food Insecurity: No Food Insecurity (01/22/2023)   Hunger Vital Sign    Worried About Running Out of Food in the Last Year: Never true    Ran Out of Food in the Last Year: Never true  Transportation Needs: No Transportation Needs (01/22/2023)   PRAPARE - Administrator, Civil Service (Medical): No    Lack of Transportation (Non-Medical): No  Physical Activity: Not on file  Stress: Not on file  Social Connections: Unknown (06/27/2022)   Received from [facility]   Social Network    Social Network: Not on file  Intimate Partner Violence: Not At Risk (01/22/2023)   Humiliation, Afraid, Rape, and Kick questionnaire    Fear of Current or Ex-Partner: No    Emotionally Abused: No    Physically Abused: No    Sexually Abused: No     Physical Exam   Vitals:   02/03/23 0100 02/03/23 0130  BP: 99/71 101/75  Pulse: 81 79  Resp: (!) 23 (!) 29  Temp:    SpO2: 100% 99%    CONSTITUTIONAL: Well-appearing, NAD NEURO/PSYCH:  Alert and oriented x 3, no focal deficits EYES:  eyes equal and reactive ENT/NECK:  no LAD, no JVD CARDIO: Regular rate, well-perfused, normal S1 and S2 PULM:  CTAB no wheezing or  rhonchi GI/GU:  non-distended, non-tender MSK/SPINE:  No gross deformities, no edema SKIN:  no rash, atraumatic   *Additional and/or pertinent findings included in MDM below  Diagnostic and Interventional Summary    EKG Interpretation Date/Time:  Tuesday February 02 2023 17:18:03 EDT Ventricular Rate:  85 PR Interval:  184 QRS Duration:  98 QT Interval:  386 QTC Calculation: 459 R Axis:   -75  Text Interpretation: Normal sinus rhythm Left axis deviation Inferior infarct , age undetermined Anteroseptal infarct , age undetermined Abnormal ECG When compared with ECG of 24-Jan-2023 10:28, PREVIOUS ECG IS PRESENT Confirmed by Kennis Carina [phone]) on 02/03/2023 1:46:45 AM       Labs Reviewed  BASIC METABOLIC PANEL - Abnormal; Notable for the following components:  Result Value   Sodium 132 (*)    Chloride 95 (*)    CO2 20 (*)    Glucose, Bld 112 (*)    BUN 46 (*)    Creatinine, Ser 1.64 (*)    GFR, Estimated 32 (*)    Anion gap 17 (*)    All other components within normal limits  CBC - Abnormal; Notable for the following components:   RDW 16.6 (*)    All other components within normal limits  BRAIN NATRIURETIC PEPTIDE - Abnormal; Notable for the following components:   B Natriuretic Peptide 3,869.9 (*)    All other components within normal limits  TROPONIN I (HIGH SENSITIVITY) - Abnormal; Notable for the following components:   Troponin I (High Sensitivity) 265 (*)    All other components within normal limits  TROPONIN I (HIGH SENSITIVITY) - Abnormal; Notable for the following components:   Troponin I (High Sensitivity) 239 (*)    All other components within normal limits    DG Chest 1 View  Final Result      Medications  furosemide (LASIX) injection 80 mg (80 mg Intravenous Given 02/03/23 0054)     Procedures  /  Critical Care Procedures  ED Course and Medical Decision Making  Initial Impression and Ddx Suspect pulmonary edema however patient does not seem  significantly hypervolemic on my initial assessment.  She says that by the time she got to the hospital her shortness of breath had resolved.  Denies any chest pain, no fever.  Could have been a brief episode of flash edema.  Doubt PE.  Had a recent NSTEMI with a troponin up to 11,000.  Taking her medicines like normal.  Past medical/surgical history that increases complexity of ED encounter: CHF  Interpretation of Diagnostics I personally reviewed the EKG and my interpretation is as follows: Sinus rhythm, no significant change from prior  Labs reveal elevated troponin though downtrending from prior values, elevated BNP greater than 3600, worsening renal function  Patient Reassessment and Ultimate Disposition/Management     Case discussed with cardiology, plan is for hospitalist admission for management of likely cardiorenal syndrome.  Patient management required discussion with the following services or consulting groups:  Hospitalist Service and Cardiology  Complexity of Problems Addressed Acute illness or injury that poses threat of life of bodily function  Additional Data Reviewed and Analyzed Further history obtained from: Further history from spouse/family member  Additional Factors Impacting ED Encounter Risk Consideration of hospitalization  Elmer Sow. Pilar Plate, MD [facility] Emergency Medicine [facility] [email or web address]  Final Clinical Impressions(s) / ED Diagnoses     ICD-10-CM   1. Cardiorenal syndrome with renal failure, stage 1-4 or unspecified chronic kidney disease, with heart failure (HCC)  I13.0       ED Discharge Orders     None        Discharge Instructions Discussed with and Provided to Patient:   Discharge Instructions   None      Sabas Sous, MD 02/03/23 [phone]

## 2023-02-03 NOTE — ED Notes (Signed)
MD Nevada Crane at bedside

## 2023-02-03 NOTE — Plan of Care (Signed)
  Problem: Pain Management: Goal: General experience of comfort will improve Outcome: Completed/Met   Problem: Skin Integrity: Goal: Risk for impaired skin integrity will decrease Outcome: Completed/Met

## 2023-02-03 NOTE — Progress Notes (Signed)
PROGRESS NOTE    Nichole Johnson  WUJ:[id number] DOB: [birth year] DOA: 02/02/2023 PCP: [name], MD    Brief Narrative:   Nichole Johnson is a 77 y.o. 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  Reportedly only can ambulate a few feet before getting severely short winded.  Also notable for orthopnea.  Denies chest pain, no significant lower extremity edema, no weight gain.  Reports compliance with her home cardiac medications.  In the ED, Temperature 98.8. BP 98/65, pulse 80, respiratory 18, O2 saturation 100% on room air.  WBC 9.9, hemoglobin 12.5, platelet count 259.  Sodium 132, serum bicarb 20, BUN 46, creatinine 1.64, GFR 32. BNP 3869. Troponin 265, 239.  BNP 3869.9.  Chest x-ray with cardiomegaly with mild central vascular pulmonary congestion, large hiatal hernia.  EKG with NSR, rate 85, QTc 459, no concerning ST elevation/depressions or T wave inversions.  Patient received IV Lasix 80 mg x 1 in the ED.  Cardiology consulted.  TRH consulted for admission for further evaluation and management of acute on chronic systolic congestive heart failure, acute renal failure on CKD stage IIIa.  Assessment & Plan:   Acute on chronic systolic congestive heart failure History of nonischemic cardiomyopathy Patient presenting with progressive shortness of breath.  History of NI CM with LVEF 25%, recent NSTEMI and recently discharged by the cardiology service on 01/26/2023.  Patient reports compliance with her home medications, no significant lower extremity edema and weights have been stable.  Patient endorses orthopnea, dyspnea on exertion.  BNP elevated 3000 169, chest x-ray  with vascular pulmonary congestion.  Recent TTE 10/12 with LVEF 25-30%, LV hypokinesis, grade 3 diastolic dysfunction, moderate MR. -- Cardiology following, appreciate assistance -- Repeat TTE ordered --Metoprolol succinate 25 mg p.o. daily -- Furosemide 80 mg IV every 12 hours -- Strict I's and O's and daily weights -- Monitor BMP daily  Acute renal failure on CKD stage IIIa Patient presenting with a creatinine elevated 1.64 with baseline 1.2.  Etiology likely secondary to volume overload, cardiorenal syndrome. -- Cr 1.64>>1.49 -- Continue IV diuresis as above -- Avoid nephrotoxins, renally dose all medications -- BMP daily  Coronary thrombus noted on left heart catheterization 01/25/2023 -- Eliquis 5 mg p.o. twice daily  Hypothyroidism -- Levothyroxine 100 mcg p.o. daily  Hyperlipidemia -- Atorvastatin 40 mg p.o. daily  Essential hypertension --Metoprolol succinate 25 mg p.o. daily   DVT prophylaxis:  apixaban (ELIQUIS) tablet 5 mg    Code Status: Full Code Family Communication: Updated husband present at bedside this morning  Disposition Plan:  Level of care: Telemetry Cardiac Status is: Inpatient Remains inpatient appropriate because: IV diuresis    Consultants:  Cardiology  Procedures:  TTE: Pending  Antimicrobials:  None   Subjective: Patient seen examined bedside, resting calmly.  Remains in ED holding area.  Patient reports her dyspnea slightly improved this morning.  Continues on IV diuresis.  Patient reports compliance with her home medications, denies any significant weight gain or lower extremity edema.  Discussed with patient likely will need increased dose of her diuretic on discharge given her persistent elevated BNP and vascular congestion noted on x-ray.  Cardiology following.  Awaiting repeat TTE.  No other specific questions or concerns at this  time.  Denies headache, no dizziness, no chest pain, no palpitations, no fever/chills/night sweats, no  nausea cefonicid diarrhea, no focal weakness, no fatigue, no paresthesias.  No acute events overnight per nursing.  Objective: Vitals:   02/03/23 0630 02/03/23 0715 02/03/23 0745 02/03/23 1055  BP: 93/67 94/68 91/69  94/77  Pulse: 73 77 78 83  Resp: 17 (!) 21 (!) 21 (!) 23  Temp: 97.8 F (36.6 C)   (!) 97.4 F (36.3 C)  TempSrc: Oral   Oral  SpO2: 100% 96% 99% 100%    Intake/Output Summary (Last 24 hours) at 02/03/2023 1156 Last data filed at 02/03/2023 0347 Gross per 24 hour  Intake --  Output 500 ml  Net -500 ml   There were no vitals filed for this visit.  Examination:  Physical Exam: GEN: NAD, alert and oriented x 3, wd/wn HEENT: NCAT, PERRL, EOMI, sclera clear, MMM PULM: Crackles noted bilateral bases, no wheezing, normal respiratory effort without accessory muscle use, on room air with SpO2 100% at rest CV: RRR w/o M/G/R GI: abd soft, NTND, + BS MSK: Trace-1+ bilateral lower extremity peripheral edema, muscle strength globally intact 5/5 bilateral upper/lower extremities NEURO: CN II-XII intact, no focal deficits, sensation to light touch intact PSYCH: normal mood/affect Integumentary: dry/intact, no rashes or wounds    Data Reviewed: I have personally reviewed following labs and imaging studies  CBC: Recent Labs  Lab 02/02/23 1756 02/03/23 0439  WBC 9.9 9.9  HGB 12.5 12.8  HCT 39.6 38.4  MCV 87.8 82.4  PLT 259 229   Basic Metabolic Panel: Recent Labs  Lab 02/02/23 1756 02/03/23 0439  NA 132* 133*  K 4.0 3.1*  CL 95* 93*  CO2 20* 22  GLUCOSE 112* 125*  BUN 46* 45*  CREATININE 1.64* 1.49*  CALCIUM 9.4 9.5  MG  --  2.5*  PHOS  --  5.0*   GFR: Estimated Creatinine Clearance: 23.3 mL/min (A) (by C-G formula based on SCr of 1.49 mg/dL (H)). Liver Function Tests: No results for input(s): "AST", "ALT", "ALKPHOS", "BILITOT", "PROT", "ALBUMIN" in the last 168 hours. No results for input(s): "LIPASE", "AMYLASE" in the last 168 hours. No results for  input(s): "AMMONIA" in the last 168 hours. Coagulation Profile: No results for input(s): "INR", "PROTIME" in the last 168 hours. Cardiac Enzymes: No results for input(s): "CKTOTAL", "CKMB", "CKMBINDEX", "TROPONINI" in the last 168 hours. BNP (last 3 results) Recent Labs    08/20/22 1539 09/08/22 1308 01/18/23 1326  PROBNP 4,330* 5,154* 9,910*   HbA1C: No results for input(s): "HGBA1C" in the last 72 hours. CBG: No results for input(s): "GLUCAP" in the last 168 hours. Lipid Profile: No results for input(s): "CHOL", "HDL", "LDLCALC", "TRIG", "CHOLHDL", "LDLDIRECT" in the last 72 hours. Thyroid Function Tests: No results for input(s): "TSH", "T4TOTAL", "FREET4", "T3FREE", "THYROIDAB" in the last 72 hours. Anemia Panel: No results for input(s): "VITAMINB12", "FOLATE", "FERRITIN", "TIBC", "IRON", "RETICCTPCT" in the last 72 hours. Sepsis Labs: No results for input(s): "PROCALCITON", "LATICACIDVEN" in the last 168 hours.  No results found for this or any previous visit (from the past 240 hour(s)).       Radiology Studies: DG Chest 1 View  Result Date: 02/02/2023 CLINICAL DATA:  Shortness of breath EXAM: CHEST  1 VIEW COMPARISON:  Chest x-ray 01/21/2023 FINDINGS: The heart is enlarged. There is a large hiatal hernia with air-fluid level. There is mild central pulmonary vascular congestion. There is no focal lung consolidation, pleural effusion or pneumothorax. No acute fractures are  seen. IMPRESSION: 1. Cardiomegaly with mild central pulmonary vascular congestion. 2. Large hiatal hernia. Electronically Signed   By: Darliss Cheney M.D.   On: 02/02/2023 20:45        Scheduled Meds:  apixaban  5 mg Oral BID   ascorbic acid  500 mg Oral Daily   atorvastatin  40 mg Oral Daily   cyanocobalamin  1,000 mcg Oral Daily   dapagliflozin propanediol  10 mg Oral Daily   fluticasone  2 spray Each Nare Daily   furosemide  80 mg Intravenous BID   latanoprost  1 drop Both Eyes QHS    levothyroxine  100 mcg Oral Daily   metoprolol succinate  25 mg Oral Daily   potassium chloride  30 mEq Oral Q3H   Continuous Infusions:   LOS: 0 days    Time spent: 53 minutes spent on chart review, discussion with nursing staff, consultants, updating family and interview/physical exam; more than 50% of that time was spent in counseling and/or coordination of care.    Alvira Philips [place], DO [facility] Available via Epic secure chat 7am-7pm After these hours, please refer to coverage provider listed on [email or web address] 02/03/2023, 11:56 AM

## 2023-02-03 NOTE — Consult Note (Addendum)
Cardiology Consultation   Patient ID: Silver Willems MRN: [id number]; DOB: 1945-10-22  Admit date: 02/02/2023 Date of Consult: 02/03/2023  PCP:  [name], MD   [facility] Providers Cardiologist:  Gypsy Balsam, MD        Patient Profile:   Nichole Johnson is a 77 y.o. female with a hx of NICM (EF 25%), recent NSTEMI (no PCI), HTN, CKD IIIa who is being seen 02/03/2023 for the evaluation of dyspnea at the request of Nichole Johnson.  History of Present Illness:   [name] was recently admitted 01/21/23 for acute on chronic combined heart failure with troponin as high as 11,645. LHC showed 80% OM3 lesion with no angioplasty completed. She was started on ASA therapy for this. There was mention of myocarditis at that time and mention of apixaban for the OM lesion due it ?thrombotic appearance. Ultimately it was determined she would be discharged on Aspirin monotherapy. GDMT was limited by hypotension and was discharged on SGLT2i alone. Her creatinine at discharge was 1.2.  Today she presents with complaints of dyspnea at rest and exacerbated by exertion. She also complains of orthopnea despite good adherence to diuretic regimen of torsemide 20 mg twice daily. She reports her oxygen levels at home dipped to the low 80's while at rest on her home pulse oximeter. She has a poor appetite and mild LE edema. She denies any chest pain or pressure.   She reports drinking 6-8 cups of water per day which is stable for her.   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   Dilated  cardiomyopathy (HCC) 04/10/2018   Dyspepsia 01/18/2017   Dysplastic nevus 12/02/2016   Glaucoma 10/14/2017   Hiatal hernia 07/20/2019   High cholesterol    History of breast cancer 04/23/2020   Hyperlipidemia 04/18/2014   Hypertension    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 Pt was given copy of recent ECHO report.  Last Assessment & Plan:  Pt will f/u w/ Cardiology re this. Pt was given copy of recent ECHO report.    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 Added automatically from request for surgery [id number] Formatting of this note might be different from the original. Added automatically from request for surgery [id number]   Osteopenia of hip 04/21/2020   Formatting of this note might be different from the original. DEXA 06/2017: Lumbar T score -1.4, right total femur -1.4. She restarted alendronate since 05/2019. Formatting of this note might be different from the original. DEXA 06/2017: Lumbar T score -1.4, right total femur -1.4. She restarted alendronate since 05/2019.   Personal history of radiation therapy 2001   Left  Breast Cancer   Reactive airway disease without complication 10/14/2017   S/P total knee arthroplasty, 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   Seasonal allergies 02/26/2014   Last Assessment & Plan: Formatting of this note might be different from the original. Start using Flonase daily. Stop using OTC Afrin Formatting of this note might be different from the original. Last Assessment & Plan: Start using Flonase daily. Stop  using OTC Afrin Formatting of this note might be different from the original. Overview: Last Assessment & Plan: Start using Flonase daily. Stop usin   Stage 3a chronic kidney disease (HCC) 04/01/2021   Trigger finger of left hand 07/26/2018    Past Surgical History:  Procedure Laterality Date   BREAST LUMPECTOMY Left 2001   CESAREAN SECTION     RIGHT/LEFT HEART CATH AND CORONARY ANGIOGRAPHY N/A 01/25/2023   Procedure: RIGHT/LEFT HEART CATH AND CORONARY ANGIOGRAPHY;  Surgeon: Yates Decamp, MD;  Location: [facility];  Service: Cardiovascular;  Laterality: N/A;   ROTATOR CUFF REPAIR Bilateral    SHOULDER CLOSED REDUCTION       Home Medications:  Prior to Admission medications   Medication Sig Start Date End Date Taking? Authorizing Provider  albuterol (PROVENTIL HFA;VENTOLIN HFA) 108 (90 Base) MCG/ACT inhaler Inhale 2 puffs into the lungs every 6 (six) hours as needed for wheezing or shortness of breath. 01/01/16   Mannam, Colbert Coyer, MD  alendronate (FOSAMAX) 35 MG tablet Take 35 mg by mouth once a week. 01/08/23   [provider]  aspirin EC (ASPIRIN 81) 81 MG tablet Take 1 tablet (81 mg total) by mouth every morning. 11/06/21   Georgeanna Lea, MD  atorvastatin (LIPITOR) 40 MG tablet Take 1 tablet (40 mg total) by mouth daily. 01/27/23   Dunn, Tacey Ruiz, PA-C  Biotin 5000 MCG TABS Take 1 tablet by mouth daily.    [provider]  brinzolamide (AZOPT) 1 % ophthalmic suspension Place 1 drop into both eyes 2 (two) times daily.    [provider]  Calcium Carbonate-Vitamin D (CALCIUM + D PO) Take 1 tablet by mouth 2 (two) times daily.     [provider]  Cholecalciferol 25 MCG (1000 UT) tablet Take 1,000 Units by mouth daily.     [provider]  dapagliflozin propanediol (FARXIGA) 10 MG TABS tablet Take 1 tablet (10 mg total) by mouth daily. 01/27/23   Dunn, Tacey Ruiz, PA-C  ferrous sulfate 325 (65 FE) MG tablet Take 325 mg by mouth daily.     [provider]  fluticasone (FLONASE) 50 MCG/ACT nasal spray Place 2 sprays into both nostrils daily. 01/01/16   Mannam, Colbert Coyer, MD  levothyroxine (SYNTHROID, LEVOTHROID) 100 MCG tablet Take 100 mcg by mouth daily. 07/20/16   [provider]  Loratadine 10 MG CAPS Take 10 mg by mouth daily.    [provider]  metoprolol succinate (TOPROL-XL) 25 MG 24 hr tablet Take 1 tablet (25 mg total) by mouth daily. 01/26/23   Dunn, Tacey Ruiz, PA-C  Multiple Vitamins-Minerals (CENTRUM SILVER PO) Take 1 tablet by mouth every morning.    [provider]  nitroGLYCERIN (NITROSTAT) 0.4 MG SL tablet Place 1 tablet (0.4 mg total) under the tongue every 5 (five) minutes as needed for chest pain (up to 3 doses). 01/26/23   Dunn, Tacey Ruiz, PA-C  Probiotic Product (ALIGN PO) Take 1 capsule by mouth daily. Align probiotic    [provider]  torsemide (DEMADEX) 20 MG tablet Take 1 tablet (20 mg total) by  mouth 2 (two) times daily. 01/13/23   Revankar, Aundra Dubin, MD  Travoprost, BAK Free, (TRAVATAN) 0.004 % SOLN ophthalmic solution Place 1 drop into both eyes at bedtime.    [provider]  vitamin B-12 (CYANOCOBALAMIN) 1000 MCG tablet Take 1,000 mcg by mouth daily.    [provider]  vitamin C (ASCORBIC ACID) 500 MG tablet Take 500 mg by mouth daily.    [provider]    Inpatient Medications: Scheduled Meds:  furosemide  80 mg Intravenous Once   Continuous Infusions:  PRN Meds:   Allergies:    Allergies  Allergen Reactions   Penicillins Itching and Other (See Comments)    Vulvovaginal Candidiasis with associated pruritis  Vaginal Infections  Vaginal Infections    Vulvovaginal Candidiasis with associated pruritis    Vulvovaginal Candidiasis with associated pruritis Vaginal Infections Vaginal Infections   Sulfamethoxazole-Trimethoprim Other (See Comments)    Makes her shaky and sweat   Tramadol Other (See Comments)    Makes her shaky  and sweat    Social History:   Social History   Socioeconomic History   Marital status: Married    Spouse name: Not on file   Number of children: Not on file   Years of education: Not on file   Highest education level: Not on file  Occupational History   Not on file  Tobacco Use   Smoking status: Never    Passive exposure: Never   Smokeless tobacco: Never   Tobacco comments:    NEVER USED TOBACCO  Vaping Use   Vaping status: Never Used  Substance and Sexual Activity   Alcohol use: Yes    Alcohol/week: 0.0 standard drinks of alcohol    Comment: occ   Drug use: No   Sexual activity: Not on file  Other Topics Concern   Not on file  Social History Narrative   Not on file   Social Determinants of Health   Financial Resource Strain: Not on file  Food Insecurity: No Food Insecurity (01/22/2023)   Hunger Vital Sign    Worried About Running Out of Food in the Last Year: Never true    Ran Out of Food in the Last Year: Never true  Transportation Needs: No Transportation Needs (01/22/2023)   PRAPARE - Administrator, Civil Service (Medical): No    Lack of Transportation (Non-Medical): No  Physical Activity: Not on file  Stress: Not on file  Social Connections: Unknown (06/27/2022)   Received from [facility], [facility]   Social Network    Social Network: Not on file  Intimate Partner Violence: Not At Risk (01/22/2023)   Humiliation, Afraid, Rape, and Kick questionnaire    Fear of Current or Ex-Partner: No    Emotionally Abused: No    Physically Abused: No    Sexually Abused: No    Family History:    Family History  Problem Relation Age of Onset   Hypertension Mother    Heart attack Neg Hx    Stroke Neg Hx    Diabetes Neg Hx      ROS:  Please see the history of present illness.   All other ROS reviewed and negative.     Physical Exam/Data:   Vitals:   02/02/23 1716 02/02/23 2318 02/02/23 2352  BP: 104/75 98/65 (!) (P) 88/64  Pulse: 83 80    Resp: 16 18 (P) 20  Temp: 97.6 F (36.4 C) 98.8 F (37.1 C) (P) 97.7 F (36.5 C)  TempSrc:   (P) Oral  SpO2: 100% 100% (P) 99%   No intake or output data in the 24 hours ending 02/03/23 0046    01/26/2023   12:27 AM 01/24/2023    4:46 AM 01/23/2023    4:00 AM  Last 3 Weights  Weight (lbs) 123 lb 8 oz 122 lb 9.2 oz 124 lb 1.9 oz  Weight (kg) 56.019 kg 55.6 kg 56.3 kg     There is no height or weight on file to calculate BMI.  General:  Well nourished, well developed, in no acute distress HEENT: normal Neck: elevated JVD Vascular: No carotid bruits; Distal pulses 2+ bilaterally Cardiac:  normal S1, S2; RRR; no murmur  Lungs:  clear to auscultation bilaterally, no wheezing, rhonchi or rales  Abd: soft, nontender, no hepatomegaly  Ext: no edema Musculoskeletal:  No deformities, BUE and BLE strength normal and equal Skin: warm and dry  Neuro:  CNs 2-12 intact, no focal abnormalities noted Psych:  Normal affect   EKG:  The EKG was personally reviewed and demonstrates:  NSR with poor R wave progression Telemetry:  Telemetry was personally reviewed and demonstrates:  NSR  Relevant CV Studies:  1. Left ventricular ejection fraction, by estimation, is 25 to 30%. The  left ventricle has severely decreased function. The left ventricle  demonstrates global hypokinesis. The left ventricular internal cavity size  was dilated. Left ventricular diastolic   parameters are consistent with Grade III diastolic dysfunction  (restrictive). Elevated left atrial pressure.   2. Right ventricular systolic function is low normal. The right  ventricular size is normal. There is normal pulmonary artery systolic  pressure.   3. The mitral valve is degenerative. Moderate mitral valve regurgitation.  No evidence of mitral stenosis.   4. The aortic valve is tricuspid. Aortic valve regurgitation is not  visualized. No aortic stenosis is present.   5. The inferior vena cava is normal in size with  <50% respiratory  variability, suggesting right atrial pressure of 8 mmHg.   Laboratory Data:  High Sensitivity Troponin:   Recent Labs  Lab 01/21/23 1948 01/22/23 0136 02/02/23 1756 02/02/23 2019  TROPONINIHS 11,645* 8,773* 265* 239*     Chemistry Recent Labs  Lab 02/02/23 1756  NA 132*  K 4.0  CL 95*  CO2 20*  GLUCOSE 112*  BUN 46*  CREATININE 1.64*  CALCIUM 9.4  GFRNONAA 32*  ANIONGAP 17*    No results for input(s): "PROT", "ALBUMIN", "AST", "ALT", "ALKPHOS", "BILITOT" in the last 168 hours. Lipids No results for input(s): "CHOL", "TRIG", "HDL", "LABVLDL", "LDLCALC", "CHOLHDL" in the last 168 hours.  Hematology Recent Labs  Lab 02/02/23 1756  WBC 9.9  RBC 4.51  HGB 12.5  HCT 39.6  MCV 87.8  MCH 27.7  MCHC 31.6  RDW 16.6*  PLT 259   Thyroid No results for input(s): "TSH", "FREET4" in the last 168 hours.  BNP Recent Labs  Lab 02/02/23 1756  BNP 3,869.9*    DDimer No results for input(s): "DDIMER" in the last 168 hours.   Radiology/Studies:  DG Chest 1 View  Result Date: 02/02/2023 CLINICAL DATA:  Shortness of breath EXAM: CHEST  1 VIEW COMPARISON:  Chest x-ray 01/21/2023 FINDINGS: The heart is enlarged. There is a large hiatal hernia with air-fluid level. There is mild central pulmonary vascular congestion. There is no focal lung consolidation, pleural effusion or pneumothorax. No acute fractures are seen. IMPRESSION: 1. Cardiomegaly with mild central pulmonary vascular congestion. 2. Large hiatal hernia. Electronically  Signed   By: Darliss Cheney M.D.   On: 02/02/2023 20:45     Assessment and Plan:   Acute on chronic systolic heart failure - Dyspnea and orthopnea, JVD elevated, BNP at max value, CXR with congestion. - Furosemide 80 mg BID - Add GDMT as BP allows - Consider ICD as her EF has not been 35% for months on GDMT  AKI on CKD IIIa - Baseline 1.2, now 1.6 - volume optimization as above  CAD - OM3 80% lesion on recent cath, medically  managed. - ASA 81 mg daily  Elevated troponin - demand in the setting of heart failure and AKI  Risk Assessment/Risk Scores:        New York Heart Association (NYHA) Functional Class NYHA Class IV        For questions or updates, please contact [facility] Please consult [email or web address] for contact info under    Signed, Roderic Palau, MD  02/03/2023 12:46 AM  Attending Attestation: Patient seen and examined, note reviewed with the signed Resident Physician/Advanced Practice Provider. I personally reviewed laboratory data, imaging studies and relevant notes. I independently examined the patient and formulated the important aspects of the plan. I have personally discussed the plan with the patient and/or family. Comments or changes to the note/plan are indicated below.  My Exam:  General: Well nourished, well developed, in no acute distress Head: Atraumatic, normal size  Eyes: PEERLA, EOMI  Neck: Supple, no JVD Endocrine: No thryomegaly Cardiac: Normal S1, S2; RRR; no murmurs, rubs, or gallops Lungs: Clear to auscultation bilaterally, no wheezing, rhonchi or rales  Abd: Soft, nontender, no hepatomegaly  Ext: No edema, pulses 2+ Musculoskeletal: No deformities, BUE and BLE strength normal and equal Skin: Warm and dry, no rashes   Neuro: Alert and oriented to person, place, time, and situation, CNII-XII grossly intact, no focal deficits  Psych: Normal mood and affect   Telemetry: SR 70s EKG: SR 85 bpm, no acute changes, anterior Q waves  Echo: LVEF 25-30%, global HK Cath: 80% OM3, normal LAD/RCA  Assessment & Plan: 77 year old female with history of chronic systolic heart failure, CKD stage IIIa, hypothyroidism admitted on 02/03/2023 with acute on chronic systolic heart failure.  # Acute on chronic systolic heart failure, EF 25 to 30% # Nonischemic cardiomyopathy -Admitted with worsening shortness of breath.  BNP is elevated at 3869 which is elevated.  Just  recently discharged from the hospital on 01/26/2023. -She does appear a bit volume overloaded.  Would start Lasix 80 mg IV twice daily. -She has chronic hypotension.  Right heart catheterization showed no evidence of low output heart failure last admission. -She is only tolerating metoprolol succinate 25 mg daily and Farxiga.  We will continue this.  Given advanced kidney dysfunction really not a good candidate for ACE/ARB/Arni/MRA.  Blood pressure will not tolerate this anyway. -Given her age her approach is largely palliative.  She is not wanting aggressive cardiovascular care. -We will see how she does with IV diuresis.  # CAD # Thromboembolic coronary episode -Recent left heart catheterization during admission in early October with non-STEMI.  Thought to have thrombotic occlusion of an OM 3 branch. -Interventional cardiology recommended Eliquis.  She was not discharged on this.  Given how normal other arteries look I believe she does merit anticoagulation. -Start Eliquis 5 mg twice daily.  Close monitoring of the dose.  She is borderline for 2.5 mg twice daily.  # CKD stage III A -Creatinine still a bit elevated.  Would recommend holding against ACE/ARB/Arni/MRA.  Signed, Lenna Gilford. Flora Lipps, MD [facility]  [facility]  02/03/2023 8:55 AM

## 2023-02-03 NOTE — ED Notes (Signed)
ED TO INPATIENT HANDOFF REPORT  ED Nurse Name and Phone #: Reesa Gotschall [phone]   S Name/Age/Gender Nichole Johnson 77 y.o. female Room/Bed: 038C/038C  Code Status   Code Status: Full Code  Home/SNF/Other Home Patient oriented to: self, place, time, and situation Is this baseline? Yes   Triage Complete: Triage complete  Chief Complaint AKI (acute kidney injury) (HCC) [N17.9]  Triage Note Patient arrives for eval of shortness of breath. Recently discharged from admission for CHF and NSTEMI. Having orthopnea at home since discharge but has progressed to DOE and sob at rest. Compliant with diuretic.     Allergies Allergies  Allergen Reactions   Penicillins Itching and Other (See Comments)    Vulvovaginal Candidiasis with associated pruritis  Vaginal Infections  Vaginal Infections    Vulvovaginal Candidiasis with associated pruritis    Vulvovaginal Candidiasis with associated pruritis Vaginal Infections Vaginal Infections   Sulfamethoxazole-Trimethoprim Other (See Comments)    Makes her shaky and sweat   Tramadol Other (See Comments)    Makes her shaky and sweat    Level of Care/Admitting Diagnosis ED Disposition     ED Disposition  Admit   Condition  --   Comment  Hospital Area: [facility] [[id number]]  Level of Care: Telemetry Cardiac [[id number]]  May admit patient to [facility] or [facility] if equivalent level of care is available:: No  Covid Evaluation: Asymptomatic - no recent exposure (last 10 days) testing not required  Diagnosis: AKI (acute kidney injury) [facility]) [[id number]]  Admitting Physician: Darlin Drop [[id number]]  Attending Physician: Darlin Drop [[id number]]  Certification:: I certify this patient will need inpatient services for at least 2 midnights  Expected Medical Readiness: 02/05/2023          B Medical/Surgery History Past Medical History:  Diagnosis Date   Acute on chronic systolic congestive heart failure (HCC) 05/19/2016    Acute renal insufficiency 06/09/2016   AKI (acute kidney injury) (HCC) 11/03/2017   Anemia 04/18/2014   Anterolisthesis of cervical spine 04/01/2021   Benign hypertension 07/08/2016   Bilateral hand pain 07/26/2018   Breast cancer (HCC) 2001   Left Breast Cancer   Cancer (HCC)    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   Dilated cardiomyopathy (HCC) 04/10/2018   Dyspepsia 01/18/2017   Dysplastic nevus 12/02/2016   Glaucoma 10/14/2017   Hiatal hernia 07/20/2019   High cholesterol    History of breast cancer 04/23/2020   Hyperlipidemia 04/18/2014   Hypertension    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 Pt was given copy of recent ECHO report.  Last Assessment & Plan:  Pt will f/u w/ Cardiology re this. Pt was given copy of recent ECHO report.    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 Added automatically from request for surgery [id number] Formatting of this note might be different from the original. Added automatically from request for surgery [id number]   Osteopenia of hip 04/21/2020   Formatting of this note might be different from the original. DEXA 06/2017: Lumbar T score -1.4, right  total femur -1.4. She restarted alendronate since 05/2019. Formatting of this note might be different from the original. DEXA 06/2017: Lumbar T score -1.4, right total femur -1.4. She restarted alendronate since 05/2019.   Personal history of radiation therapy 2001   Left Breast Cancer   Reactive airway disease without  complication 10/14/2017   S/P total knee arthroplasty, 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   Seasonal allergies 02/26/2014   Last Assessment & Plan: Formatting of this note might be different from the original. Start using Flonase daily. Stop using OTC Afrin Formatting of this note might be different from the original. Last Assessment & Plan: Start using Flonase daily. Stop using OTC Afrin Formatting of this note might be different from the original. Overview: Last Assessment & Plan: Start using Flonase daily. Stop usin   Stage 3a chronic kidney disease (HCC) 04/01/2021   Trigger finger of left hand 07/26/2018   Past Surgical History:  Procedure Laterality Date   BREAST LUMPECTOMY Left 2001   CESAREAN SECTION     RIGHT/LEFT HEART CATH AND CORONARY ANGIOGRAPHY N/A 01/25/2023   Procedure: RIGHT/LEFT HEART CATH AND CORONARY ANGIOGRAPHY;  Surgeon: Yates Decamp, MD;  Location: [facility] INVASIVE CV LAB;  Service: Cardiovascular;  Laterality: N/A;   ROTATOR CUFF REPAIR Bilateral    SHOULDER CLOSED REDUCTION       A IV Location/Drains/Wounds Patient Lines/Drains/Airways Status     Active Line/Drains/Airways     Name Placement date Placement time Site Days   Peripheral IV 02/03/23 20 G Anterior;Left Forearm 02/03/23  0014  Forearm  less than 1            Intake/Output Last 24 hours  Intake/Output Summary (Last 24 hours) at 02/03/2023 1152 Last data filed at 02/03/2023 0347 Gross per 24 hour  Intake --  Output 500 ml  Net -500 ml    Labs/Imaging Results for orders placed or performed during the hospital encounter of 02/02/23 (from the past 48 hour(s))  Basic metabolic panel     Status: Abnormal   Collection Time: 02/02/23  5:56 PM  Result Value Ref Range   Sodium 132 (L) 135 - 145 mmol/L   Potassium 4.0 3.5 - 5.1 mmol/L   Chloride 95 (L) 98 - 111 mmol/L   CO2 20 (L) 22 - 32 mmol/L   Glucose, Bld 112 (H) 70 - 99 mg/dL    Comment: Glucose reference range applies only to samples taken after fasting for  at least 8 hours.   BUN 46 (H) 8 - 23 mg/dL   Creatinine, Ser 5.28 (H) 0.44 - 1.00 mg/dL   Calcium 9.4 8.9 - 41.3 mg/dL   GFR, Estimated 32 (L) >60 mL/min    Comment: (NOTE) Calculated using the CKD-EPI Creatinine Equation (2021)    Anion gap 17 (H) 5 - 15    Comment: Performed at [facility], [address], [place], [place] [address]  CBC     Status: Abnormal   Collection Time: 02/02/23  5:56 PM  Result Value Ref Range   WBC 9.9 4.0 - 10.5 K/uL   RBC 4.51 3.87 - 5.11 MIL/uL   Hemoglobin 12.5 12.0 - 15.0 g/dL   HCT 02.7 25.3 - 66.4 %   MCV 87.8 80.0 - 100.0 fL   MCH 27.7 26.0 - 34.0 pg   MCHC 31.6 30.0 - 36.0 g/dL   RDW 40.3 (H) 47.4 - 25.9 %   Platelets 259 150 - 400 K/uL   nRBC 0.0 0.0 - 0.2 %  Comment: Performed at [facility], [address] [place], [place] [address]  Troponin I (High Sensitivity)     Status: Abnormal   Collection Time: 02/02/23  5:56 PM  Result Value Ref Range   Troponin I (High Sensitivity) 265 (HH) <18 ng/L    Comment: CRITICAL RESULT CALLED TO, READ BACK BY AND VERIFIED WITH J LINT RN 02/02/2023 1914 BNUNNERY (NOTE) Elevated high sensitivity troponin I (hsTnI) values and significant  changes across serial measurements may suggest ACS but many other  chronic and acute conditions are known to elevate hsTnI results.  Refer to the "Links" section for chest pain algorithms and additional  guidance. Performed at [facility], [address], [place], [place] [address]   Brain natriuretic peptide     Status: Abnormal   Collection Time: 02/02/23  5:56 PM  Result Value Ref Range   B Natriuretic Peptide 3,869.9 (H) 0.0 - 100.0 pg/mL    Comment: Performed at [facility], [address], [place], [place] [address]  Troponin I (High Sensitivity)     Status: Abnormal   Collection Time: 02/02/23  8:19 PM  Result Value Ref Range   Troponin I (High Sensitivity) 239 (HH) <18 ng/L    Comment: CRITICAL VALUE NOTED. VALUE IS  CONSISTENT WITH PREVIOUSLY REPORTED/CALLED VALUE (NOTE) Elevated high sensitivity troponin I (hsTnI) values and significant  changes across serial measurements may suggest ACS but many other  chronic and acute conditions are known to elevate hsTnI results.  Refer to the "Links" section for chest pain algorithms and additional  guidance. Performed at [facility], [address], [place], [place] [address]   Basic metabolic panel     Status: Abnormal   Collection Time: 02/03/23  4:39 AM  Result Value Ref Range   Sodium 133 (L) 135 - 145 mmol/L   Potassium 3.1 (L) 3.5 - 5.1 mmol/L   Chloride 93 (L) 98 - 111 mmol/L   CO2 22 22 - 32 mmol/L   Glucose, Bld 125 (H) 70 - 99 mg/dL    Comment: Glucose reference range applies only to samples taken after fasting for at least 8 hours.   BUN 45 (H) 8 - 23 mg/dL   Creatinine, Ser 1.02 (H) 0.44 - 1.00 mg/dL   Calcium 9.5 8.9 - 72.5 mg/dL   GFR, Estimated 36 (L) >60 mL/min    Comment: (NOTE) Calculated using the CKD-EPI Creatinine Equation (2021)    Anion gap 18 (H) 5 - 15    Comment: Performed at [facility], [address], [place], [place] [address]  CBC     Status: Abnormal   Collection Time: 02/03/23  4:39 AM  Result Value Ref Range   WBC 9.9 4.0 - 10.5 K/uL   RBC 4.66 3.87 - 5.11 MIL/uL   Hemoglobin 12.8 12.0 - 15.0 g/dL   HCT 03.4 74.2 - 59.5 %   MCV 82.4 80.0 - 100.0 fL   MCH 27.5 26.0 - 34.0 pg   MCHC 33.3 30.0 - 36.0 g/dL   RDW 63.8 (H) 75.6 - 43.3 %   Platelets 229 150 - 400 K/uL   nRBC 0.2 0.0 - 0.2 %    Comment: Performed at [facility], [address] [place], [place] [address]  Magnesium     Status: Abnormal   Collection Time: 02/03/23  4:39 AM  Result Value Ref Range   Magnesium 2.5 (H) 1.7 - 2.4 mg/dL  Comment: Performed at [facility], [address] [place], [place] [address]  Phosphorus     Status: Abnormal   Collection Time: 02/03/23  4:39 AM  Result Value Ref Range   Phosphorus 5.0  (H) 2.5 - 4.6 mg/dL    Comment: Performed at [facility], [address] [place], [place] [address]   DG Chest 1 View  Result Date: 02/02/2023 CLINICAL DATA:  Shortness of breath EXAM: CHEST  1 VIEW COMPARISON:  Chest x-ray 01/21/2023 FINDINGS: The heart is enlarged. There is a large hiatal hernia with air-fluid level. There is mild central pulmonary vascular congestion. There is no focal lung consolidation, pleural effusion or pneumothorax. No acute fractures are seen. IMPRESSION: 1. Cardiomegaly with mild central pulmonary vascular congestion. 2. Large hiatal hernia. Electronically Signed   By: Darliss Cheney M.D.   On: 02/02/2023 20:45    Pending Labs Unresulted Labs (From admission, onward)    None       Vitals/Pain Today's Vitals   02/03/23 0630 02/03/23 0715 02/03/23 0745 02/03/23 1055  BP: 93/67 94/68 91/69  94/77  Pulse: 73 77 78 83  Resp: 17 (!) 21 (!) 21 (!) 23  Temp: 97.8 F (36.6 C)   (!) 97.4 F (36.3 C)  TempSrc: Oral   Oral  SpO2: 100% 96% 99% 100%  PainSc:        Isolation Precautions No active isolations  Medications Medications  atorvastatin (LIPITOR) tablet 40 mg (40 mg Oral Given 02/03/23 0914)  latanoprost (XALATAN) 0.005 % ophthalmic solution 1 drop (has no administration in time range)  cyanocobalamin (VITAMIN B12) tablet 1,000 mcg (1,000 mcg Oral Given 02/03/23 0914)  ascorbic acid (VITAMIN C) tablet 500 mg (500 mg Oral Given 02/03/23 0914)  levothyroxine (SYNTHROID) tablet 100 mcg (100 mcg Oral Given 02/03/23 0639)  fluticasone (FLONASE) 50 MCG/ACT nasal spray 2 spray (2 sprays Each Nare Not Given 02/03/23 1007)  albuterol (PROVENTIL) (2.5 MG/3ML) 0.083% nebulizer solution 2.5 mg (has no administration in time range)  acetaminophen (TYLENOL) tablet 650 mg (has no administration in time range)  prochlorperazine (COMPAZINE) injection 5 mg (has no administration in time range)  polyethylene glycol (MIRALAX / GLYCOLAX) packet 17 g (has no  administration in time range)  melatonin tablet 5 mg (has no administration in time range)  furosemide (LASIX) injection 80 mg (80 mg Intravenous Given 02/03/23 0903)  potassium chloride (KLOR-CON M) CR tablet 30 mEq (30 mEq Oral Given 02/03/23 0904)  metoprolol succinate (TOPROL-XL) 24 hr tablet 25 mg (25 mg Oral Not Given 02/03/23 1007)  dapagliflozin propanediol (FARXIGA) tablet 10 mg (has no administration in time range)  apixaban (ELIQUIS) tablet 5 mg (5 mg Oral Given 02/03/23 1032)  furosemide (LASIX) injection 80 mg (80 mg Intravenous Given 02/03/23 0054)    Mobility walks with person assist     Focused Assessments Pulmonary Assessment Handoff:  Lung sounds: Bilateral Breath Sounds: Rhonchi O2 Device: Nasal Cannula O2 Flow Rate (L/min): 2 L/min    R Recommendations: See Admitting Provider Note  Report given to:   Additional Notes:

## 2023-02-03 NOTE — TOC Benefit Eligibility Note (Signed)
Patient Product/process development scientist completed.    The patient is insured through [employer]. Patient has Medicare and is not eligible for a copay card, but may be able to apply for patient assistance, if available.    Ran test claim for Eliquis 5 mg and the current 30 day co-pay is $45.00.   This test claim was processed through [facility]- copay amounts may vary at other pharmacies due to pharmacy/plan contracts, or as the patient moves through the different stages of their insurance plan.     Roland Earl, CPHT Pharmacy Technician III Certified Patient Advocate [facility] Patient Advocate Team Direct Number: [phone]  Fax: [phone]

## 2023-02-03 NOTE — H&P (Addendum)
History and Physical  Nichole Johnson GEX:[id number] DOB: 1945-07-07 DOA: 02/02/2023  Referring physician: Dr. Pilar Johnson, EDP  PCP: Nichole Perna, MD  Outpatient Specialists: Cardiology. Patient coming from: Home.  Chief Complaint: Shortness of breath.  HPI: Nichole Johnson is a 77 y.o. 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  Could only ambulate a few feet before getting severely short winded.  Also reports orthopnea.    States she was started on Farxiga on Wednesday, 1 day after her discharge from the hospital and since then has been having shortness of breath intermittently.  Denies chest pain.  Denies significant lower extremity edema.  Has been compliant with her home cardiac medications.  In the ED, tachypneic, chest x-ray revealed cardiomegaly with mild pulmonary edema.  Troponin elevated but improved from 12 days ago, 229 from greater than 11,000.  Twelve-lead EKG showing no evidence of acute ischemia.  BNP is significantly elevated greater than 3800 from 3000, 12 days ago.  Creatinine elevated 1.64 from above baseline 1.22.  The patient received a dose of IV Lasix 80 mg x 1 in the ED.  Cardiology consulted.  Admitted by [facility], hospitalist service.  ED Course: Temperature 98.8.  BP 98/65, pulse 80, respiratory 18, O2 saturation 100% on room air.  Lab studies notable for serum sodium 132, serum bicarb 20, BUN 46, creatinine 1.64, GFR 32.  BNP 3869.  Troponin 265, 239.  CBC essentially unremarkable.  Review of Systems: Review of systems as noted in the HPI. All other systems reviewed  and are negative.   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   Dilated cardiomyopathy (HCC) 04/10/2018   Dyspepsia 01/18/2017   Dysplastic nevus 12/02/2016   Glaucoma 10/14/2017   Hiatal hernia 07/20/2019   High cholesterol    History of breast cancer 04/23/2020   Hyperlipidemia 04/18/2014   Hypertension    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 Pt was given copy of recent ECHO report.  Last Assessment & Plan:  Pt will f/u w/ Cardiology re this. Pt was given copy of recent ECHO report.    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 Added automatically from request for surgery [id number] Formatting of this note might be different from the original. Added automatically from request for surgery [id number]   Osteopenia of hip 04/21/2020  Formatting of this note might be different from the original. DEXA 06/2017: Lumbar T score -1.4, right total femur -1.4. She restarted alendronate since 05/2019. Formatting of this note might be different from the original. DEXA 06/2017: Lumbar T score -1.4, right total femur -1.4. She restarted  alendronate since 05/2019.   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 Start using Flonase daily. Stop using OTC Afrin Formatting of this note might be different from the original. Last Assessment & Plan: Start using Flonase daily. Stop using OTC Afrin Formatting of this note might be different from the original. Overview: Last Assessment & Plan: Start using Flonase daily. Stop usin   Stage 3a chronic kidney disease (HCC) 04/01/2021   Trigger finger of left hand 07/26/2018   Past Surgical History:  Procedure Laterality Date   BREAST LUMPECTOMY Left 2001   CESAREAN SECTION     RIGHT/LEFT HEART CATH AND CORONARY ANGIOGRAPHY N/A 01/25/2023   Procedure: RIGHT/LEFT HEART CATH AND CORONARY ANGIOGRAPHY;  Surgeon: Nichole Decamp, MD;  Location: [facility] INVASIVE CV LAB;  Service: Cardiovascular;  Laterality: N/A;   ROTATOR CUFF REPAIR Bilateral    SHOULDER CLOSED REDUCTION      Social History:  reports that she has never smoked. She has never been exposed to tobacco smoke. She has never used smokeless tobacco. She reports current alcohol use. She reports that she does not use drugs.   Allergies  Allergen Reactions   Penicillins Itching and Other (See Comments)    Vulvovaginal Candidiasis with associated pruritis  Vaginal Infections  Vaginal Infections    Vulvovaginal Candidiasis with associated pruritis    Vulvovaginal Candidiasis with associated pruritis Vaginal Infections Vaginal Infections   Sulfamethoxazole-Trimethoprim Other (See Comments)    Makes her shaky and sweat   Tramadol Other (See Comments)    Makes her shaky and sweat    Family History  Problem Relation Age of Onset   Hypertension Mother    Heart attack Neg Hx    Stroke Neg Hx     Diabetes Neg Hx       Prior to Admission medications   Medication Sig Start Date End Date Taking? Authorizing Provider  albuterol (PROVENTIL HFA;VENTOLIN HFA) 108 (90 Base) MCG/ACT inhaler Inhale 2 puffs into the lungs every 6 (six) hours as needed for wheezing or shortness of breath. 01/01/16   Mannam, Colbert Coyer, MD  alendronate (FOSAMAX) 35 MG tablet Take 35 mg by mouth once a week. 01/08/23   [provider]  aspirin EC (ASPIRIN 81) 81 MG tablet Take 1 tablet (81 mg total) by mouth every morning. 11/06/21   Georgeanna Lea, MD  atorvastatin (LIPITOR) 40 MG tablet Take 1 tablet (40 mg total) by mouth daily. 01/27/23   Dunn, Tacey Ruiz, PA-C  Biotin 5000 MCG TABS Take 1 tablet by mouth daily.    [provider]  brinzolamide (AZOPT) 1 % ophthalmic suspension Place 1 drop into both eyes 2 (two) times daily.    [provider]  Calcium Carbonate-Vitamin D (CALCIUM + D PO) Take 1 tablet by mouth 2 (two) times daily.     [provider]  Cholecalciferol 25 MCG (1000 UT) tablet Take 1,000 Units by mouth daily.     [provider]  dapagliflozin propanediol Marcelline Deist)  10 MG TABS tablet Take 1 tablet (10 mg total) by mouth daily. 01/27/23   Dunn, Tacey Ruiz, PA-C  ferrous sulfate 325 (65 FE) MG tablet Take 325 mg by mouth daily.    [provider]  fluticasone (FLONASE) 50 MCG/ACT nasal spray Place 2 sprays into both nostrils daily. 01/01/16   Mannam, Colbert Coyer, MD  levothyroxine (SYNTHROID, LEVOTHROID) 100 MCG tablet Take 100 mcg by mouth daily. 07/20/16   [provider]  Loratadine 10 MG CAPS Take 10 mg by mouth daily.    [provider]  metoprolol succinate (TOPROL-XL) 25 MG 24 hr tablet Take 1 tablet (25 mg total) by mouth daily. 01/26/23   Dunn, Tacey Ruiz, PA-C  Multiple Vitamins-Minerals (CENTRUM SILVER PO) Take 1 tablet by mouth every morning.    [provider]  nitroGLYCERIN (NITROSTAT) 0.4 MG SL tablet Place 1 tablet (0.4 mg  total) under the tongue every 5 (five) minutes as needed for chest pain (up to 3 doses). 01/26/23   Dunn, Tacey Ruiz, PA-C  Probiotic Product (ALIGN PO) Take 1 capsule by mouth daily. Align probiotic    [provider]  torsemide (DEMADEX) 20 MG tablet Take 1 tablet (20 mg total) by mouth 2 (two) times daily. 01/13/23   Revankar, Aundra Dubin, MD  Travoprost, BAK Free, (TRAVATAN) 0.004 % SOLN ophthalmic solution Place 1 drop into both eyes at bedtime.    [provider]  vitamin B-12 (CYANOCOBALAMIN) 1000 MCG tablet Take 1,000 mcg by mouth daily.    [provider]  vitamin C (ASCORBIC ACID) 500 MG tablet Take 500 mg by mouth daily.    [provider]    Physical Exam: BP 105/75   Pulse 77   Temp (P) 97.7 F (36.5 C) (Oral)   Resp (!) 23   SpO2 99%   General: 77 y.o. year-old female well developed well nourished in no acute distress.  Alert and oriented x3. Cardiovascular: Regular rate and rhythm with no rubs or gallops.  No thyromegaly.  Left JVD present.  Trace lower extremity edema bilaterally. Respiratory: Clear to auscultation with no wheezes or rales. Good inspiratory effort. Abdomen: Soft nontender nondistended with normal bowel sounds x4 quadrants. Muskuloskeletal: No cyanosis or clubbing noted bilaterally Neuro: CN II-XII intact, strength, sensation, reflexes Skin: No ulcerative lesions noted or rashes Psychiatry: Judgement and insight appear normal. Mood is appropriate for condition and setting          Labs on Admission:  Basic Metabolic Panel: Recent Labs  Lab 02/02/23 1756  NA 132*  K 4.0  CL 95*  CO2 20*  GLUCOSE 112*  BUN 46*  CREATININE 1.64*  CALCIUM 9.4   Liver Function Tests: No results for input(s): "AST", "ALT", "ALKPHOS", "BILITOT", "PROT", "ALBUMIN" in the last 168 hours. No results for input(s): "LIPASE", "AMYLASE" in the last 168 hours. No results for input(s): "AMMONIA" in the last 168 hours. CBC: Recent Labs  Lab  02/02/23 1756  WBC 9.9  HGB 12.5  HCT 39.6  MCV 87.8  PLT 259   Cardiac Enzymes: No results for input(s): "CKTOTAL", "CKMB", "CKMBINDEX", "TROPONINI" in the last 168 hours.  BNP (last 3 results) Recent Labs    08/06/22 1931 01/21/23 1948 02/02/23 1756  BNP 1,243.8* 3,090.9* 3,869.9*    ProBNP (last 3 results) Recent Labs    08/20/22 1539 09/08/22 1308 01/18/23 1326  PROBNP 4,330* 5,154* 9,910*    CBG: No results for input(s): "GLUCAP" in the last 168 hours.  Radiological Exams  on Admission: DG Chest 1 View  Result Date: 02/02/2023 CLINICAL DATA:  Shortness of breath EXAM: CHEST  1 VIEW COMPARISON:  Chest x-ray 01/21/2023 FINDINGS: The heart is enlarged. There is a large hiatal hernia with air-fluid level. There is mild central pulmonary vascular congestion. There is no focal lung consolidation, pleural effusion or pneumothorax. No acute fractures are seen. IMPRESSION: 1. Cardiomegaly with mild central pulmonary vascular congestion. 2. Large hiatal hernia. Electronically Signed   By: Darliss Cheney M.D.   On: 02/02/2023 20:45    EKG: I independently viewed the EKG done and my findings are as followed: Sinus rhythm rate of 85.  Nonspecific ST-T changes.  QTc 459.  Assessment/Plan Present on Admission:  AKI (acute kidney injury) (HCC)  Principal Problem:   AKI (acute kidney injury) (HCC)  Acute kidney injury, concern for possible cardiorenal syndrome Baseline creatinine 1.22 with GFR 45. Presented with creatinine of 1.64 with GFR 32. Avoid nephrotoxic agents as able, and avoid hypotension. Monitor urine output Repeat renal function test in the morning.  Acute on chronic combined diastolic and systolic CHF Last 2D echo done on 01/23/2023 revealed LVEF 25 to 30%, left ventricle global hypokinesis, grade 3 diastolic dysfunction.  Moderate mitral valve regurgitation, without evidence of mitral stenosis. Presented with BNP greater than 3800, left JVD, cardiomegaly and  pulmonary edema seen on chest x-ray IV Lasix 80 mg x 1 given in the ED Start strict I&O's and daily weight Follow Limited 2D echo to rule out any acute changes from recent 2D echo. Hold off home [facility] Cardiology consulted  High anion gap metabolic acidosis in the setting of acute renal insufficiency Serum bicarb 20, anion gap 17, Continue to monitor.  Coronary thrombus seen on LHC on 01/25/2023 Eliquis 5 mg twice daily until seen by cardiology Follow repeat limited 2D echo  Hypothyroidism Resume home levothyroxine  Hypertension BPs are currently soft with SBP's in the 90s Hold off home Toprol-XL, restart beta-blocker as blood pressure allows. Closely monitor vital signs   Critical care time: 55 minutes.   DVT prophylaxis: Eliquis  Code Status: Full code  Family Communication: Updated patient's husband at bedside  Disposition Plan: Admitted to telemetry cardiac unit  Consults called: Cardiology  Admission status: Inpatient status.   Status is: Inpatient The patient requires at least 2 midnights for further evaluation and treatment of present condition.   Darlin Drop MD [facility] Pager [phone]  If 7PM-7AM, please contact night-coverage [email or web address] Password [email or web address]  02/03/2023, 2:16 AM

## 2023-02-03 NOTE — Progress Notes (Signed)
Heart Failure Navigator Progress Note  Assessed for Heart & Vascular TOC clinic readiness.  Patient does not meet criteria due to Advanced Heart Failure Team patient.   Navigator will sign off at this time.   Roxy Horseman, RN, BSN [facility] Heart Failure Navigator Secure Chat Only

## 2023-02-04 DIAGNOSIS — I5023 Acute on chronic systolic (congestive) heart failure: Secondary | ICD-10-CM

## 2023-02-04 DIAGNOSIS — N179 Acute kidney failure, unspecified: Secondary | ICD-10-CM | POA: Diagnosis not present

## 2023-02-04 DIAGNOSIS — I5021 Acute systolic (congestive) heart failure: Secondary | ICD-10-CM | POA: Diagnosis not present

## 2023-02-04 LAB — BASIC METABOLIC PANEL
Anion gap: 15 (ref 5–15)
BUN: 48 mg/dL — ABNORMAL HIGH (ref 8–23)
CO2: 20 mmol/L — ABNORMAL LOW (ref 22–32)
Calcium: 9.4 mg/dL (ref 8.9–10.3)
Chloride: 98 mmol/L (ref 98–111)
Creatinine, Ser: 1.77 mg/dL — ABNORMAL HIGH (ref 0.44–1.00)
GFR, Estimated: 29 mL/min — ABNORMAL LOW (ref >60)
Glucose, Bld: 119 mg/dL — ABNORMAL HIGH (ref 70–99)
Potassium: 5.3 mmol/L — ABNORMAL HIGH (ref 3.5–5.1)
Sodium: 133 mmol/L — ABNORMAL LOW (ref 135–145)

## 2023-02-04 LAB — MAGNESIUM: Magnesium: 2.8 mg/dL — ABNORMAL HIGH (ref 1.7–2.4)

## 2023-02-04 LAB — BRAIN NATRIURETIC PEPTIDE: B Natriuretic Peptide: 4354.8 pg/mL — ABNORMAL HIGH (ref 0.0–100.0)

## 2023-02-04 MED ORDER — FUROSEMIDE 10 MG/ML IJ SOLN
120.0000 mg | Freq: Two times a day (BID) | INTRAVENOUS | Status: DC
  Filled 2023-02-04 (×2): qty 12

## 2023-02-04 MED ORDER — FUROSEMIDE 10 MG/ML IJ SOLN: 80.0000 mg | Freq: Two times a day (BID) | INTRAMUSCULAR | Status: DC

## 2023-02-04 MED ORDER — LORAZEPAM 0.5 MG PO TABS: 0.5000 mg | ORAL_TABLET | Freq: Four times a day (QID) | ORAL | Status: DC | PRN

## 2023-02-04 MED ORDER — METOLAZONE 5 MG PO TABS
5.0000 mg | ORAL_TABLET | Freq: Once | ORAL | Status: AC
  Administered 2023-02-04: 5 mg via ORAL
  Filled 2023-02-04: qty 1

## 2023-02-04 MED ORDER — FUROSEMIDE 10 MG/ML IJ SOLN
80.0000 mg | Freq: Two times a day (BID) | INTRAMUSCULAR | Status: DC
  Administered 2023-02-04 – 2023-02-05 (×2): 80 mg via INTRAVENOUS
  Filled 2023-02-04 (×2): qty 8

## 2023-02-04 NOTE — Evaluation (Signed)
Physical Therapy Evaluation Patient Details Name: Nichole Johnson MRN: [id number] DOB: 18-Oct-1945 Today's Date: 02/04/2023  History of Present Illness  Pt is a 77 y/o female admitted 10/22 to the ED from home due to worsening exertional dyspnea.  Work up includes Acute on Chronic systolic CHF.  PMHx:  including but not limited to  sCHF, AKI, Breast CA, cardiomyopathy, htn, BIL tka  Clinical Impression  Pt admitted with/for worsening WOB due to AonChronic CHF.  Pt mobile at a CGA level with/without AD, but not at baseline due to general /cardio/pulm deconditioning.  Pt currently limited functionally due to the problems listed below.  (see problems list.)  Pt will benefit from PT to maximize function and safety to be able to get home safely with available assist .         If plan is discharge home, recommend the following: A little help with bathing/dressing/bathroom;Assistance with cooking/housework;Assist for transportation   Can travel by private vehicle        Equipment Recommendations None recommended by PT  Recommendations for Other Services       Functional Status Assessment Patient has had a recent decline in their functional status and demonstrates the ability to make significant improvements in function in a reasonable and predictable amount of time.     Precautions / Restrictions Precautions Precautions: Fall      Mobility  Bed Mobility Overal bed mobility: Modified Independent                  Transfers Overall transfer level: Needs assistance   Transfers: Sit to/from Stand Sit to Stand: Supervision                Ambulation/Gait Ambulation/Gait assistance: Contact guard assist Gait Distance (Feet): 90 Feet Assistive device: Rolling walker (2 wheels), None Gait Pattern/deviations: Step-through pattern   Gait velocity interpretation: <1.8 ft/sec, indicate of risk for recurrent falls   General Gait Details: notably, but not significantly more  unsteady without an AD.  Pt was more tentative, shorter step length occasionally hovered close to the rail, but not using the rail generally.  Pt was cautiou/ anxious with turns, changes in speed and backing up.  Stairs            Wheelchair Mobility     Tilt Bed    Modified Rankin (Stroke Patients Only)       Balance Overall balance assessment: Needs assistance Sitting-balance support: No upper extremity supported, Feet supported Sitting balance-Leahy Scale: Good     Standing balance support: No upper extremity supported, Single extremity supported, During functional activity Standing balance-Leahy Scale: Fair                               Pertinent Vitals/Pain Pain Assessment Pain Assessment: Faces Faces Pain Scale: No hurt Pain Intervention(s): Monitored during session    Home Living Family/patient expects to be discharged to:: Private residence Living Arrangements: Spouse/significant other;Children (Son lives with pt/husband) Available Help at Discharge: Family;Available 24 hours/day Type of Home: House Home Access: Stairs to enter     Alternate Level Stairs-Number of Steps: flight (with stair lift) Home Layout: Two level;Bed/bath upstairs Home Equipment: Shower seat;Grab bars - tub/shower;Rolling Walker (2 wheels);Cane - single point      Prior Function Prior Level of Function : Independent/Modified Independent                     Extremity/Trunk Assessment  Upper Extremity Assessment Upper Extremity Assessment: Defer to OT evaluation (NT formally, but functional)    Lower Extremity Assessment Lower Extremity Assessment: Generalized weakness    Cervical / Trunk Assessment Cervical / Trunk Assessment: Normal  Communication   Communication Communication: No apparent difficulties  Cognition Arousal: Alert Behavior During Therapy: WFL for tasks assessed/performed Overall Cognitive Status: Within Functional Limits for tasks  assessed                                          General Comments General comments (skin integrity, edema, etc.): VSS, sats on RA in the low/mid 90's,HR in low 90's    Exercises     Assessment/Plan    PT Assessment Patient needs continued PT services  PT Problem List Decreased strength;Decreased activity tolerance;Decreased balance;Decreased mobility;Decreased knowledge of use of DME;Cardiopulmonary status limiting activity       PT Treatment Interventions DME instruction;Gait training;Stair training;Functional mobility training;Therapeutic activities;Balance training;Patient/family education    PT Goals (Current goals can be found in the Care Plan section)  Acute Rehab PT Goals Patient Stated Goal: back to "Pre-vacation to [place]" function. PT Goal Formulation: With patient Time For Goal Achievement: 02/18/23 Potential to Achieve Goals: Good    Frequency Min 1X/week     Co-evaluation               AM-PAC PT "6 Clicks" Mobility  Outcome Measure Help needed turning from your back to your side while in a flat bed without using bedrails?: A Little Help needed moving from lying on your back to sitting on the side of a flat bed without using bedrails?: A Little Help needed moving to and from a bed to a chair (including a wheelchair)?: A Little Help needed standing up from a chair using your arms (e.g., wheelchair or bedside chair)?: A Little Help needed to walk in hospital room?: A Little Help needed climbing 3-5 steps with a railing? : A Little 6 Click Score: 18    End of Session   Activity Tolerance: Patient tolerated treatment well Patient left: in bed;with call bell/phone within reach;with family/visitor present Nurse Communication: Mobility status PT Visit Diagnosis: Unsteadiness on feet (R26.81);Muscle weakness (generalized) (M62.81)    Time: 1610-9604 PT Time Calculation (min) (ACUTE ONLY): 31 min   Charges:   PT Evaluation $PT  Eval Moderate Complexity: 1 Mod PT Treatments $Gait Training: 8-22 mins PT General Charges $$ ACUTE PT VISIT: 1 Visit         02/04/2023  Jacinto Halim., PT Acute Rehabilitation Services [phone]  (office)  [name] 02/04/2023, 4:30 PM

## 2023-02-04 NOTE — Progress Notes (Signed)
Cardiology Progress Note  Patient ID: Nichole Johnson MRN: [id number] DOB: [birth year] Date of Encounter: 02/04/2023 Primary Cardiologist: Gypsy Balsam, MD  Subjective   Chief Complaint: SOB  HPI: Still with significant shortness of breath and orthopnea.  ROS:  All other ROS reviewed and negative. Pertinent positives noted in the HPI.     Vital Signs   Vitals:   02/04/23 0437 02/04/23 0714 02/04/23 0719 02/04/23 0854  BP:   95/71   Pulse:   71 72  Resp:   17 20  Temp:   (!) 97.4 F (36.3 C)   TempSrc:  Oral Oral   SpO2:  91%  91%  Weight: 55.5 kg     Height:        Intake/Output Summary (Last 24 hours) at 02/04/2023 0933 Last data filed at 02/03/2023 2312 Gross per 24 hour  Intake 100 ml  Output 701 ml  Net -601 ml      02/04/2023    4:37 AM 02/03/2023    2:02 PM 02/03/2023    1:00 PM  Last 3 Weights  Weight (lbs) 122 lb 5.7 oz 122 lb 5.7 oz 122 lb 6.4 oz  Weight (kg) 55.5 kg 55.5 kg 55.52 kg      Telemetry  Overnight telemetry shows sinus rhythm 80s, which I personally reviewed.     Physical Exam   Vitals:   02/04/23 0437 02/04/23 0714 02/04/23 0719 02/04/23 0854  BP:   95/71   Pulse:   71 72  Resp:   17 20  Temp:   (!) 97.4 F (36.3 C)   TempSrc:  Oral Oral   SpO2:  91%  91%  Weight: 55.5 kg     Height:        Intake/Output Summary (Last 24 hours) at 02/04/2023 0933 Last data filed at 02/03/2023 2312 Gross per 24 hour  Intake 100 ml  Output 701 ml  Net -601 ml       02/04/2023    4:37 AM 02/03/2023    2:02 PM 02/03/2023    1:00 PM  Last 3 Weights  Weight (lbs) 122 lb 5.7 oz 122 lb 5.7 oz 122 lb 6.4 oz  Weight (kg) 55.5 kg 55.5 kg 55.52 kg    Body mass index is 24.71 kg/m.  General: Well nourished, well developed, in no acute distress Head: Atraumatic, normal size  Eyes: PEERLA, EOMI  Neck: Supple, JVD 8 to 10 cm of water Endocrine: No thryomegaly Cardiac: Normal S1, S2; RRR; no murmurs, rubs, or gallops Lungs: Crackles at the  lung bases Abd: Soft, nontender, no hepatomegaly  Ext: No edema, pulses 2+ Musculoskeletal: No deformities, BUE and BLE strength normal and equal Skin: Warm and dry, no rashes   Neuro: Alert and oriented to person, place, time, and situation, CNII-XII grossly intact, no focal deficits  Psych: Normal mood and affect   Cardiac Studies  RHC/LHC 01/25/2023 Right & Left Heart Catheterization 01/25/23: Hemodynamic data: LV 100/2, EDP 9 mmHg.  Ao 148/66, mean 95 mmHg.  No pressure gradient across the aortic valve. RA: 3/2, mean 2 mmHg. RV 37/2, EDP 5 mmHg. PA 36/15, mean 22 mmHg.  PA saturation 64%. PW 12/12, mean 10 mmHg.  Aortic saturation 94%. QP/QS 1.00. CO 4.3, CI 2.86 by Fick.  Angiographic data: LM: Large-caliber vessel.  It smooth and normal. LAD: Large-caliber vessel, gives origin to a moderate-sized D1 and several small diagonals.  It is small and normal. LCx: Large-caliber vessel giving origin to a very tiny  OM1 and OM 2 and large OM 3.  Moderate sized AV groove circumflex.  The OM 3 has a focal 80% thrombotic lesion that appears to be organized.  There is TIMI-3 flow distally. RCA: Moderate caliber vessel, dominant, smooth and normal.  TTE 01/23/2023  1. Left ventricular ejection fraction, by estimation, is 25 to 30%. The  left ventricle has severely decreased function. The left ventricle  demonstrates global hypokinesis. The left ventricular internal cavity size  was dilated. Left ventricular diastolic   parameters are consistent with Grade III diastolic dysfunction  (restrictive). Elevated left atrial pressure.   2. Right ventricular systolic function is low normal. The right  ventricular size is normal. There is normal pulmonary artery systolic  pressure.   3. The mitral valve is degenerative. Moderate mitral valve regurgitation.  No evidence of mitral stenosis.   4. The aortic valve is tricuspid. Aortic valve regurgitation is not  visualized. No aortic stenosis is  present.   5. The inferior vena cava is normal in size with <50% respiratory  variability, suggesting right atrial pressure of 8 mmHg.   Patient Profile  Nichole Johnson is a 77 y.o. 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  Assessment & Plan   # Acute on chronic systolic heart failure, EF 25-30% # CKD stage IIIa -Continues to report symptoms of orthopnea and shortness of breath.  She does have JVD on examination.  Chest x-ray shows pulmonary edema.  Suboptimal urine output. -Her creatinine is slightly elevated but I think this is likely due to her heart failure and diuresis.  I think she could benefit from more diuresis.  Will continue 80 mg IV Lasix twice daily.  I have added metolazone 5 mg as a one-time dose. -Recent right heart catheterization shows she is stable and not in low output.  She does have baseline hypotension and has not tolerated much GDMT. -Given issues with tolerating GDMT and persistent symptoms of heart failure I have recommended the advanced heart failure team to see her.  They will consult on her today.  She may end up needing inotropes to improve diuresis.  I will defer that to their team. -In the interim continue Farxiga 10 mg daily.  Continue metoprolol succinate 25 mg daily.  She has been intolerant of other GDMT due to hypotension.  # CAD, thrombotic occlusion of OM 3 -Recently mated to the hospital with non-STEMI.  Found to have thrombotic occlusion of an OM branch.  Eliquis was recommended but she was not discharged on this.  She reports no chest pain.  I have started her on Eliquis.  No need for aspirin at this time.  Continue high intensity statin.  # CKD IIIA # AKI -Still appears a bit volume up.  Does have AKI.  Would recommend to continue with diuresis.  We have asked the advanced heart failure team to weigh in on need  for possible inotropes.     For questions or updates, please contact [facility] Please consult [email or web address] for contact info under        Signed, Gerri Spore T. Flora Lipps, MD, [facility]  [facility]  02/04/2023 9:33 AM

## 2023-02-04 NOTE — Progress Notes (Addendum)
PROGRESS NOTE    Nichole Johnson  ZOX:[id number] DOB: 13-Aug-1945 DOA: 02/02/2023 PCP: [name], MD    Brief Narrative:   Nichole Johnson is a 77 y.o. female with past medical history significant for chronic systolic congestive heart failure, nonischemic cardiomyopathy (LVEF 25%), recent NSTEMI, HTN, CKD stage IIIa, coronary thrombus on LHC, HTN, osteoarthritis s/p bilateral knee replacement, recently admitted by the cardiology service from 10/10 - 01/26/2023 who presented to [facility] ED from home with worsening exertional dyspnea.  Reportedly only can ambulate a few feet before getting severely short winded.  Also notable for orthopnea.  Denies chest pain, no significant lower extremity edema, no weight gain.  Reports compliance with her home cardiac medications.  In the ED, Temperature 98.8. BP 98/65, pulse 80, respiratory 18, O2 saturation 100% on room air.  WBC 9.9, hemoglobin 12.5, platelet count 259.  Sodium 132, serum bicarb 20, BUN 46, creatinine 1.64, GFR 32. BNP 3869. Troponin 265, 239.  BNP 3869.9.  Chest x-ray with cardiomegaly with mild central vascular pulmonary congestion, large hiatal hernia.  EKG with NSR, rate 85, QTc 459, no concerning ST elevation/depressions or T wave inversions.  Patient received IV Lasix 80 mg x 1 in the ED.  Cardiology consulted.  TRH consulted for admission for further evaluation and management of acute on chronic systolic congestive heart failure, acute renal failure on CKD stage IIIa.  Assessment & Plan:   Acute on chronic systolic congestive heart failure History of nonischemic cardiomyopathy Patient presenting with progressive shortness of breath.  History of NI CM with LVEF 25%, recent NSTEMI and recently discharged by the cardiology service on 01/26/2023.  Patient reports compliance with her home medications, no significant lower extremity edema and weights have been stable.  Patient endorses orthopnea, dyspnea on exertion.  BNP elevated 3169, chest x-ray with  vascular pulmonary congestion.  Recent TTE 10/12 with LVEF 25-30%, LV hypokinesis, grade 3 diastolic dysfunction, moderate MR. -- Cardiology following, appreciate assistance -- Metoprolol succinate 25 mg p.o. daily -- Furosemide 80 mg IV every 12 hours -- Unable to tolerate further GDMT due to BP -- Strict I's and O's and daily weights -- Monitor BMP daily -- Advanced heart failure team consulted by cardiology today; as patient may need inotropic support  Acute renal failure on CKD stage IIIa Patient presenting with a creatinine elevated 1.64 with baseline 1.2.  Etiology likely secondary to volume overload, cardiorenal syndrome. -- Cr 1.64>>1.49>1.77 -- Continue IV diuresis as above -- Avoid nephrotoxins, renally dose all medications -- BMP daily  Hypokalemia Repleted. -- BMP daily  Coronary thrombus noted on left heart catheterization 01/25/2023 -- Eliquis 5 mg p.o. twice daily  Hypothyroidism -- Levothyroxine 100 mcg p.o. daily  Hyperlipidemia -- Atorvastatin 40 mg p.o. daily  Essential hypertension -- Metoprolol succinate 25 mg p.o. daily   DVT prophylaxis:  apixaban (ELIQUIS) tablet 5 mg    Code Status: Full Code Family Communication: Updated husband present at bedside this morning  Disposition Plan:  Level of care: Telemetry Cardiac Status is: Inpatient Remains inpatient appropriate because: IV diuresis    Consultants:  Cardiology  Procedures:  TTE: Pending  Antimicrobials:  None   Subjective: Patient seen examined bedside, resting calmly.  Sitting at edge of bed.  Husband present.  Continues with significant shortness of breath, orthopnea.  Very anxious regarding her breathing.  Husband reports "several episodes" overnight.  Review of telemetry notable for occasional PVCs but no other significant findings. Continues on IV diuresis.  Advanced heart failure team  consulted by cardiology today. No other specific questions or concerns at this time.  Denies  headache, no dizziness, no chest pain, no palpitations, no fever/chills/night sweats, no nausea cefonicid diarrhea, no focal weakness, no fatigue, no paresthesias.  No acute events overnight per nursing staff.  Objective: Vitals:   02/04/23 0437 02/04/23 0714 02/04/23 0719 02/04/23 0854  BP:   95/71   Pulse:   71 72  Resp:   17 20  Temp:   (!) 97.4 F (36.3 C)   TempSrc:  Oral Oral   SpO2:  91%  91%  Weight: 55.5 kg     Height:        Intake/Output Summary (Last 24 hours) at 02/04/2023 1148 Last data filed at 02/04/2023 1100 Gross per 24 hour  Intake 100 ml  Output 1301 ml  Net -1201 ml   Filed Weights   02/03/23 1300 02/03/23 1402 02/04/23 0437  Weight: 55.5 kg 55.5 kg 55.5 kg    Examination:  Physical Exam: GEN: NAD, alert and oriented x 3, wd/wn HEENT: NCAT, PERRL, EOMI, sclera clear, MMM PULM: Crackles noted bilateral bases, no wheezing, normal respiratory effort without accessory muscle use, on room air with SpO2 100% at rest CV: RRR w/o M/G/R GI: abd soft, NTND, + BS MSK: Trace-1+ bilateral lower extremity peripheral edema, muscle strength globally intact 5/5 bilateral upper/lower extremities NEURO: CN II-XII intact, no focal deficits, sensation to light touch intact PSYCH: normal mood/affect Integumentary: dry/intact, no rashes or wounds    Data Reviewed: I have personally reviewed following labs and imaging studies  CBC: Recent Labs  Lab 02/02/23 1756 02/03/23 0439  WBC 9.9 9.9  HGB 12.5 12.8  HCT 39.6 38.4  MCV 87.8 82.4  PLT 259 229   Basic Metabolic Panel: Recent Labs  Lab 02/02/23 1756 02/03/23 0439 02/04/23 0347  NA 132* 133* 133*  K 4.0 3.1* 5.3*  CL 95* 93* 98  CO2 20* 22 20*  GLUCOSE 112* 125* 119*  BUN 46* 45* 48*  CREATININE 1.64* 1.49* 1.77*  CALCIUM 9.4 9.5 9.4  MG  --  2.5* 2.8*  PHOS  --  5.0*  --    GFR: Estimated Creatinine Clearance: 19.6 mL/min (A) (by C-G formula based on SCr of 1.77 mg/dL (H)). Liver Function  Tests: No results for input(s): "AST", "ALT", "ALKPHOS", "BILITOT", "PROT", "ALBUMIN" in the last 168 hours. No results for input(s): "LIPASE", "AMYLASE" in the last 168 hours. No results for input(s): "AMMONIA" in the last 168 hours. Coagulation Profile: No results for input(s): "INR", "PROTIME" in the last 168 hours. Cardiac Enzymes: No results for input(s): "CKTOTAL", "CKMB", "CKMBINDEX", "TROPONINI" in the last 168 hours. BNP (last 3 results) Recent Labs    08/20/22 1539 09/08/22 1308 01/18/23 1326  PROBNP 4,330* 5,154* 9,910*   HbA1C: No results for input(s): "HGBA1C" in the last 72 hours. CBG: No results for input(s): "GLUCAP" in the last 168 hours. Lipid Profile: No results for input(s): "CHOL", "HDL", "LDLCALC", "TRIG", "CHOLHDL", "LDLDIRECT" in the last 72 hours. Thyroid Function Tests: No results for input(s): "TSH", "T4TOTAL", "FREET4", "T3FREE", "THYROIDAB" in the last 72 hours. Anemia Panel: No results for input(s): "VITAMINB12", "FOLATE", "FERRITIN", "TIBC", "IRON", "RETICCTPCT" in the last 72 hours. Sepsis Labs: No results for input(s): "PROCALCITON", "LATICACIDVEN" in the last 168 hours.  No results found for this or any previous visit (from the past 240 hour(s)).       Radiology Studies: DG Chest 1 View  Result Date: 02/02/2023 CLINICAL DATA:  Shortness  of breath EXAM: CHEST  1 VIEW COMPARISON:  Chest x-ray 01/21/2023 FINDINGS: The heart is enlarged. There is a large hiatal hernia with air-fluid level. There is mild central pulmonary vascular congestion. There is no focal lung consolidation, pleural effusion or pneumothorax. No acute fractures are seen. IMPRESSION: 1. Cardiomegaly with mild central pulmonary vascular congestion. 2. Large hiatal hernia. Electronically Signed   By: Darliss Cheney M.D.   On: 02/02/2023 20:45        Scheduled Meds:  apixaban  5 mg Oral BID   ascorbic acid  500 mg Oral Daily   atorvastatin  40 mg Oral Daily   brinzolamide   1 drop Both Eyes BID   budesonide  0.25 mg Nebulization BID   cyanocobalamin  1,000 mcg Oral Daily   dapagliflozin propanediol  10 mg Oral Daily   fluticasone  2 spray Each Nare Daily   furosemide  80 mg Intravenous BID   latanoprost  1 drop Both Eyes QHS   levothyroxine  100 mcg Oral Daily   metoprolol succinate  25 mg Oral Daily   Continuous Infusions:   LOS: 1 day    Time spent: 53 minutes spent on chart review, discussion with nursing staff, consultants, updating family and interview/physical exam; more than 50% of that time was spent in counseling and/or coordination of care.    Alvira Philips [place], DO [facility] Available via Epic secure chat 7am-7pm After these hours, please refer to coverage provider listed on [email or web address] 02/04/2023, 11:48 AM

## 2023-02-04 NOTE — Consult Note (Signed)
Advanced Heart Failure Team Consult Note   Primary Physician: [name], MD PCP-Cardiologist:  Gypsy Balsam, MD  Reason for Consultation: Acute on Chronic Diastolic and Systolic Heart Failure  HPI:    Nichole Johnson is seen today for evaluation of acute on chronic diastolic and systolic heart failure at the request of Dr. [place], Internal Medicine.   She is a 77 y.o. 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   After moving to [place] she was initially followed by Dr. Bary Castilla who referred her to [facility] where she saw Dr. Shon Hale. Cardiac MRI at [facility] on 08/12/16 showed EF 26% with dilated left ventricle 6.2 cm mild to moderate mitral regurgitation. There was subendocardial hyperenhancement involving themid-distal anterior wall, consistent with subendocardial infarction in the distribution of a diagonal branch of the LAD. Adenosine stress perfusion imaging demonstrated no evidence of inducible myocardial ischemia. Because of hypotension he did not initiate spironolactone or Entresto. She declined ARNI/ MRA and ICD.  In 2019 established care w Dr. Dulce Sellar. EF 20-25 at the time. cMRI showed sev LV function and mid-mod RV function RVEF 34% with transmural delayed myocardial enhancement in the mid-apical LV anterior wall suggestive of prior infarct. No viability distribution.   Was seen in AHF clinic in 2021, and elected to follow with Dr. Dulce Sellar and contact us if they change their minds about pursuing more aggressive HF work-up/therapy Overall, patient was fairly well compensated from a heart failure standpoint until March 2024. She underwent knee replacement and post procedure developed SOB, weakness, fatigue, hypotension.   Echo 7/24 showed EF 30-35 w grade 1 diastolic dysfunction. nl RV. Elevated PAP and  mod MR. After Echo in had traveled to [place] and developed COVID pneumonia and admitted to the hospital for 5 day.   Recent admission 10/10-10/15 for CHF exacerbation and NSTEMI, trop 11,645 and BNP 3090. Repeat Echo 01/23/23 EF 25-30%, G3DD, elevated LAP, global HK, nl RVSF, nl PASP, mod MR. L/RHC showed RA 2, CI 2.86, PA36/15 PWCP 13. There was mention of myocarditis at that time and mention of apixaban for the OM lesion due it ?thrombotic appearance. Ultimately it was determined she would be discharged on Aspirin monotherapy. She was diuresed with IV lasix and GDMT titration was limited by low bp, home Toprol decreased. She was discharge on Toprol 25, torsemide 20 bid, atorvastatin 40 and instructed to start Faxiga at discharge. She was set to follow with [facility] clinic tomorrow.  Seen 10/17 by [facility] for hospital follow up, she had inspiratory cough and was instructed to continue her Qvar and albuterol. PFT testing and home night oximetry test was ordered.  Pt presented to [facility] 10/23 with shortness of breath. On admission CXR showed cardiomegaly, pulm edema, and large hiatal hernia. Lab noted for BNP 6,010>9323, Trop 265>239, NA 132, K 4.0, Cr 1.64 BUN 46. She admitted and started on IV Lasix and her home nebs and medications continued.  Home Medications Prior to Admission medications   Medication Sig Start Date End Date Taking? Authorizing Provider  albuterol (PROVENTIL HFA;VENTOLIN HFA) 108 (90 Base) MCG/ACT inhaler Inhale 2 puffs into the lungs every 6 (six) hours as needed for wheezing or shortness of breath. 01/01/16  Yes Mannam, Praveen, MD  alendronate (FOSAMAX) 35 MG tablet Take 35 mg by mouth once a week. 01/08/23  Yes [provider]  ARNUITY ELLIPTA 100 MCG/ACT AEPB Inhale 1 puff into the lungs daily. 01/29/23 01/29/24 Yes [provider]  aspirin EC (ASPIRIN 81) 81 MG tablet Take 1 tablet (81 mg total) by mouth every morning. 11/06/21  Yes Georgeanna Lea,  MD  Biotin 5000 MCG TABS Take 1 tablet by mouth daily.   Yes [provider]  brinzolamide (AZOPT) 1 % ophthalmic suspension Place 1 drop into both eyes 2 (two) times daily.   Yes [provider]  Calcium Carbonate-Vitamin D (CALCIUM + D PO) Take 1 tablet by mouth 2 (two) times daily.    Yes [provider]  Cholecalciferol 25 MCG (1000 UT) tablet Take 1,000 Units by mouth daily.    Yes [provider]  dapagliflozin propanediol (FARXIGA) 10 MG TABS tablet Take 1 tablet (10 mg total) by mouth daily. 01/27/23  Yes Dunn, Dayna N, PA-C  ferrous sulfate 325 (65 FE) MG tablet Take 325 mg by mouth daily.   Yes [provider]  fluticasone (FLONASE) 50 MCG/ACT nasal spray Place 2 sprays into both nostrils daily. Patient taking differently: Place 2 sprays into both nostrils daily as needed for allergies or rhinitis. 01/01/16  Yes Mannam, Praveen, MD  Hypromellose (GENTEAL OP) Place 1 drop into both eyes in the morning, at noon, in the evening, and at bedtime.   Yes [provider]  latanoprost (XALATAN) 0.005 % ophthalmic solution Place 1 drop into both eyes at bedtime.   Yes [provider]  levothyroxine (SYNTHROID, LEVOTHROID) 100 MCG tablet Take 100 mcg by mouth daily. 07/20/16  Yes [provider]  Loratadine 10 MG CAPS Take 10 mg by mouth daily.   Yes [provider]  metoprolol succinate (TOPROL-XL) 25 MG 24 hr tablet Take 1 tablet (25 mg total) by mouth daily. 01/26/23  Yes Dunn, Dayna N, PA-C  Multiple Vitamins-Minerals (CENTRUM SILVER PO) Take 1 tablet by mouth every morning.   Yes [provider]  nitroGLYCERIN (NITROSTAT) 0.4 MG SL tablet Place 1 tablet (0.4 mg total) under the tongue every 5 (five) minutes as needed for chest pain (up to 3 doses). 01/26/23  Yes Dunn, Dayna N, PA-C  simvastatin (ZOCOR) 20 MG tablet Take 20 mg by mouth daily.   Yes [provider]  torsemide (DEMADEX) 20 MG tablet Take  1 tablet (20 mg total) by mouth 2 (two) times daily. 01/13/23  Yes Revankar, Aundra Dubin, MD  vitamin B-12 (CYANOCOBALAMIN) 1000 MCG tablet Take 1,000 mcg by mouth daily.   Yes [provider]  vitamin C (ASCORBIC ACID) 500 MG tablet Take 500 mg by mouth daily.   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   Dilated cardiomyopathy (HCC) 04/10/2018   Dyspepsia 01/18/2017   Dysplastic nevus 12/02/2016   Glaucoma 10/14/2017   Hiatal hernia 07/20/2019   High cholesterol    History of breast cancer 04/23/2020   Hyperlipidemia 04/18/2014   Hypertension    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   Hypotension 06/09/2016  Hypothyroidism 04/18/2014   Impaired functional mobility, balance, gait, and endurance 08/07/2022   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 Pt was given copy of recent ECHO report.  Last Assessment & Plan:  Pt will f/u w/ Cardiology re this. Pt was given copy of recent ECHO report.    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 Added automatically from request for surgery [id number] Formatting of this note might be different from the original. Added automatically from request for surgery [id number]   Osteopenia of hip  04/21/2020   Formatting of this note might be different from the original. DEXA 06/2017: Lumbar T score -1.4, right total femur -1.4. She restarted alendronate since 05/2019. Formatting of this note might be different from the original. DEXA 06/2017: Lumbar T score -1.4, right total femur -1.4. She restarted alendronate since 05/2019.   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 Start using Flonase daily. Stop using OTC Afrin Formatting of this note might be different from the original. Last Assessment & Plan: Start using Flonase daily. Stop using OTC Afrin Formatting of this note might be different from the original. Overview: Last Assessment & Plan: Start using Flonase daily. Stop usin   Stage 3a chronic kidney disease (HCC) 04/01/2021   Trigger finger of left hand 07/26/2018    Past Surgical History: Past Surgical History:  Procedure Laterality Date   BREAST LUMPECTOMY Left 2001   CESAREAN SECTION     RIGHT/LEFT HEART CATH AND CORONARY ANGIOGRAPHY N/A 01/25/2023   Procedure: RIGHT/LEFT HEART CATH AND CORONARY ANGIOGRAPHY;  Surgeon: Yates Decamp, MD;  Location: [facility] INVASIVE CV LAB;  Service: Cardiovascular;  Laterality: N/A;   ROTATOR CUFF REPAIR Bilateral    SHOULDER CLOSED REDUCTION      Family History: Family History  Problem Relation Age of Onset   Hypertension Mother    Heart attack Neg Hx    Stroke Neg Hx    Diabetes Neg Hx     Social History: Social History   Socioeconomic History   Marital status: Married    Spouse name: Not on file   Number of children: Not on file   Years of education: Not on file   Highest education level: Not on file  Occupational History   Not on file  Tobacco Use   Smoking status: Never    Passive  exposure: Never   Smokeless tobacco: Never   Tobacco comments:    NEVER USED TOBACCO  Vaping Use   Vaping status: Never Used  Substance and Sexual Activity   Alcohol use: Yes    Alcohol/week: 0.0 standard drinks of alcohol    Comment: occ   Drug use: No   Sexual activity: Not on file  Other Topics Concern   Not on file  Social History Narrative   Not on file   Social Determinants of Health   Financial Resource Strain: Not on file  Food Insecurity: No Food Insecurity (01/22/2023)   Hunger Vital Sign    Worried About  Running Out of Food in the Last Year: Never true    Ran Out of Food in the Last Year: Never true  Transportation Needs: No Transportation Needs (01/22/2023)   PRAPARE - Administrator, Civil Service (Medical): No    Lack of Transportation (Non-Medical): No  Physical Activity: Not on file  Stress: Not on file  Social Connections: Unknown (06/27/2022)   [facility]   Social Network    Social Network: Not on file    Allergies:  Allergies  Allergen Reactions   Penicillins Itching and Other (See Comments)    Vulvovaginal Candidiasis with associated pruritis  Vaginal Infections  Vaginal Infections    Vulvovaginal Candidiasis with associated pruritis    Vulvovaginal Candidiasis with associated pruritis Vaginal Infections Vaginal Infections   Sulfamethoxazole-Trimethoprim Other (See Comments)    Makes her shaky and sweat   Tramadol Other (See Comments)    Makes her shaky and sweat    Objective:    Vital Signs:   Temp:  [97.4 F (36.3 C)-98 F (36.7 C)] 97.4 F (36.3 C) (10/24 0719) Pulse Rate:  [71-95] 72 (10/24 0854) Resp:  [16-23] 20 (10/24 0854) BP: (94-125)/(65-85) 95/71 (10/24 0719) SpO2:  [91 %-100 %] 91 % (10/24 0854) Weight:  [55.5 kg] 55.5 kg (10/24 0437) Last BM Date : 02/03/23  Weight change: Filed Weights   02/03/23 1300 02/03/23 1402 02/04/23 0437  Weight: 55.5 kg 55.5 kg 55.5 kg     Intake/Output:   Intake/Output Summary (Last 24 hours) at 02/04/2023 1001 Last data filed at 02/03/2023 2312 Gross per 24 hour  Intake 100 ml  Output 701 ml  Net -601 ml     Physical Exam    General:  Well appearing. No resp difficulty HEENT: normal Neck: supple. JVP to jaw. Carotids 2+ bilat; no bruits. No lymphadenopathy or thyromegaly appreciated. Cor: PMI nondisplaced. Regular rate & rhythm. No rubs, gallops or murmurs. Lungs: clear Abdomen: soft, nontender, nondistended. No hepatosplenomegaly. No bruits or masses. Good bowel sounds. Extremities: no cyanosis, clubbing, rash, mild peripheral edema Neuro: alert & orientedx3, cranial nerves grossly intact. moves all 4 extremities w/o difficulty. Affect pleasant  Telemetry   SR 70s w frequent PVCs  EKG    NSR 85 BPM   Labs   Basic Metabolic Panel: Recent Labs  Lab 02/02/23 1756 02/03/23 0439 02/04/23 0347  NA 132* 133* 133*  K 4.0 3.1* 5.3*  CL 95* 93* 98  CO2 20* 22 20*  GLUCOSE 112* 125* 119*  BUN 46* 45* 48*  CREATININE 1.64* 1.49* 1.77*  CALCIUM 9.4 9.5 9.4  MG  --  2.5* 2.8*  PHOS  --  5.0*  --     Liver Function Tests: No results for input(s): "AST", "ALT", "ALKPHOS", "BILITOT", "PROT", "ALBUMIN" in the last 168 hours. No results for input(s): "LIPASE", "AMYLASE" in the last 168 hours. No results for input(s): "AMMONIA" in the last 168 hours.  CBC: Recent Labs  Lab 02/02/23 1756 02/03/23 0439  WBC 9.9 9.9  HGB 12.5 12.8  HCT 39.6 38.4  MCV 87.8 82.4  PLT 259 229    Cardiac Enzymes: No results for input(s): "CKTOTAL", "CKMB", "CKMBINDEX", "TROPONINI" in the last 168 hours.  BNP: BNP (last 3 results) Recent Labs    01/21/23 1948 02/02/23 1756 02/04/23 0347  BNP 3,090.9* 3,869.9* 4,354.8*    ProBNP (last 3 results) Recent Labs    08/20/22 1539 09/08/22 1308 01/18/23 1326  PROBNP 4,330* 5,154* 9,910*  CBG: No results for input(s): "GLUCAP" in the last 168  hours.  Coagulation Studies: No results for input(s): "LABPROT", "INR" in the last 72 hours.   Imaging   No results found.   Medications:     Current Medications:  apixaban  5 mg Oral BID   ascorbic acid  500 mg Oral Daily   atorvastatin  40 mg Oral Daily   brinzolamide  1 drop Both Eyes BID   budesonide  0.25 mg Nebulization BID   cyanocobalamin  1,000 mcg Oral Daily   dapagliflozin propanediol  10 mg Oral Daily   fluticasone  2 spray Each Nare Daily   furosemide  80 mg Intravenous BID   latanoprost  1 drop Both Eyes QHS   levothyroxine  100 mcg Oral Daily   metoprolol succinate  25 mg Oral Daily    Infusions:   Patient Profile   Nichole Johnson is a 77 y.o. 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 Admitted for evaluation of dyspnea and orthopnea.  Assessment/Plan   1. Acute on chronic HFrEF/NICM with moderate MR - Etiology: suspect related to adriamycin toxicity by cMRI shows possible diagonal infarct with no ischemia on adenosine stress. 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 Focal thrombotic stenosis noted in the large OM 3 branch of C without significant CAD. 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 - Volume overloaded on exam. Continue Lasix 80mg  IV BID. Metolazone 5 given today. Strict I/O and daily weights.  - GDMT limited by hypotension and renal function - Farxiga 10 mg daily (started 10/16) - Continue Metoprolol 25 XL - Discussed need for possible RHC if symptoms do not improve with diuresis. Patient and family were not amenable to  future heart caths at this time.   2. NSTEMI/CAD - Trop 151>761 - LHC 10/24: 80% OM3 lesion otherwise nonobstructive disease to be managed medically - ? Cath note outlines question of Covid myocarditis and use of Eliquis; Ultimately it was determined she would be discharged on Aspirin monotherapy. - Eliquis 5 bid - Lipitor 40   3. AKI on CKDIIIb - sCr on admission 1.64>1.77 - Goal K>4; Mg>2 - Diuresis as above - Follow BMET  4. Restrictive Lung Disease/OSA - followed by [facility], PFTs and nighttime oximetry ordered as outpatient - + orthopnea - nebs per primary  5. Hypothyroid  - Synthroid  Length of Stay: 1  [place] Minda Faas, NP  02/04/2023, 10:01 AM  Advanced Heart Failure Team Pager [phone] (M-F; 7a - 5p)  Please contact [facility] for night-coverage after hours (4p -7a ) and weekends on [email or web address]

## 2023-02-04 NOTE — Discharge Instructions (Signed)
Information on my medicine - ELIQUIS (apixaban)  This medication education was reviewed with me or my healthcare representative as part of my discharge preparation.    Why was Eliquis prescribed for you? Eliquis was prescribed to treat blood clots that may have been found in the veins of your legs (deep vein thrombosis) or in your lungs (pulmonary embolism) and to reduce the risk of them occurring again.  What do You need to know about Eliquis ? Continue Eliquis ONE 5 mg tablet taken TWICE daily.  Eliquis may be taken with or without food.   Try to take the dose about the same time in the morning and in the evening. If you have difficulty swallowing the tablet whole please discuss with your pharmacist how to take the medication safely.  Take Eliquis exactly as prescribed and DO NOT stop taking Eliquis without talking to the doctor who prescribed the medication.  Stopping may increase your risk of developing a new blood clot.  Refill your prescription before you run out.  After discharge, you should have regular check-up appointments with your healthcare provider that is prescribing your Eliquis.    What do you do if you miss a dose? If a dose of ELIQUIS is not taken at the scheduled time, take it as soon as possible on the same day and twice-daily administration should be resumed. The dose should not be doubled to make up for a missed dose.  Important Safety Information A possible side effect of Eliquis is bleeding. You should call your healthcare provider right away if you experience any of the following: Bleeding from an injury or your nose that does not stop. Unusual colored urine (red or dark brown) or unusual colored stools (red or black). Unusual bruising for unknown reasons. A serious fall or if you hit your head (even if there is no bleeding).  Some medicines may interact with Eliquis and might increase your risk of bleeding or clotting while on Eliquis. To help avoid  this, consult your healthcare provider or pharmacist prior to using any new prescription or non-prescription medications, including herbals, vitamins, non-steroidal anti-inflammatory drugs (NSAIDs) and supplements.  This website has more information on Eliquis (apixaban): http://www.eliquis.com/eliquis/home

## 2023-02-04 NOTE — TOC Initial Note (Signed)
Transition of Care [facility]) - Initial/Assessment Note    Patient Details  Name: Nichole Johnson MRN: [id number] Date of Birth: 1946/02/11  Transition of [facility] CM/SW Contact:    Nicanor Bake Phone Number: [phone] 02/04/2023, 3:48 PM  Clinical Narrative:    [facility] CSW met with pt and son, Barbara Cower at bedside. Pt stated that she lives at home with her husband and son. Pt stated that she has no history of HH services. Pt stated that she has a scale at home. Pt stated that she uses a cane, walker, and stair lift at home. Pt stated that she has an appointment scheduled with her PCP  on February 05, 2023. CSW called to confirm and and cancel pts appointment. CSW will call and reschedule pts appointment closer to dc.   TOC will continue following.              Expected Discharge Plan: Home/Self Care Barriers to Discharge: Continued Medical Work up   Patient Goals and CMS Choice Patient states their goals for this hospitalization and ongoing recovery are:: return home          Expected Discharge Plan and Services       Living arrangements for the past 2 months: Single Family Home                                      Prior Living Arrangements/Services Living arrangements for the past 2 months: Single Family Home Lives with:: Spouse, Adult Children Patient language and need for interpreter reviewed:: Yes Do you feel safe going back to the place where you live?: Yes      Need for Family Participation in Patient Care: Yes (Comment) Care giver support system in place?: Yes (comment)   Criminal Activity/Legal Involvement Pertinent to Current Situation/Hospitalization: No - Comment as needed  Activities of Daily Living   ADL Screening (condition at time of admission) Independently performs ADLs?: Yes (appropriate for developmental age) Does the patient have a NEW difficulty with bathing/dressing/toileting/self-feeding that is expected to last >3 days?: No Does the  patient have a NEW difficulty with getting in/out of bed, walking, or climbing stairs that is expected to last >3 days?: No Does the patient have a NEW difficulty with communication that is expected to last >3 days?: No Is the patient deaf or have difficulty hearing?: No Does the patient have difficulty seeing, even when wearing glasses/contacts?: No Does the patient have difficulty concentrating, remembering, or making decisions?: No  Permission Sought/Granted                  Emotional Assessment Appearance:: Appears stated age Attitude/Demeanor/Rapport: Engaged Affect (typically observed): Appropriate Orientation: : Oriented to Self, Oriented to Place, Oriented to  Time, Oriented to Situation Alcohol / Substance Use: Not Applicable Psych Involvement: No (comment)  Admission diagnosis:  AKI (acute kidney injury) (HCC) [N17.9] Cardiorenal syndrome with renal failure, stage 1-4 or unspecified chronic kidney disease, with heart failure (HCC) [I13.0] Patient Active Problem List   Diagnosis Date Noted   CAD (coronary artery disease) 01/26/2023   Non-ST elevation (NSTEMI) myocardial infarction (HCC) 01/26/2023   Acute on chronic systolic (congestive) heart failure (HCC) 01/22/2023   Heart failure (HCC) 01/21/2023   Impaired functional mobility, balance, gait, and endurance 08/07/2022   Anterolisthesis of cervical spine 04/01/2021   Stage 3a chronic kidney disease (HCC) 04/01/2021   Morton's neuroma of right  foot 08/13/2020   Hypertension    High cholesterol    Cancer (HCC)    History of breast cancer 04/23/2020   Osteopenia of hip 04/21/2020   Osteoarthritis of left knee 08/17/2019   Hiatal hernia 07/20/2019   Bilateral hand pain 07/26/2018   Trigger finger of left hand 07/26/2018   Dilated cardiomyopathy (HCC) 04/10/2018   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   AKI (acute kidney injury) (HCC)  11/03/2017   Glaucoma 10/14/2017   Reactive airway disease without complication 10/14/2017   S/P total knee arthroplasty, 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   Dyspepsia 01/18/2017   Dysplastic nevus 12/02/2016   Benign hypertension 07/08/2016   Obstructive sleep apnea (adult) (pediatric) 07/08/2016   Acute renal insufficiency 06/09/2016   Hypotension 06/09/2016   Acute on chronic systolic congestive heart failure (HCC) 05/19/2016   Irritable bowel syndrome without diarrhea 12/10/2014   Hyperlipidemia 04/18/2014   Anemia 04/18/2014   Hypothyroidism 04/18/2014   Mild diastolic dysfunction 04/18/2014   Seasonal allergies 02/26/2014   Personal history of radiation therapy 2001   Breast cancer (HCC) 2001   PCP:  [name], MD Pharmacy:   [facility] [address] [place], [place] - [address] [place] [address] Phone: [phone] Fax: [phone]  [facility] - [place], [place] - [address] [place] [address] Phone: [phone] Fax: [phone]  [facility] [address] [place] [address] Phone: [phone] Fax: [phone]     Social Determinants of Health (SDOH) Social History: SDOH Screenings   Food Insecurity: No Food Insecurity (01/22/2023)  Housing: Low Risk  (01/22/2023)  Transportation Needs: No Transportation Needs (01/22/2023)  Utilities: Not At Risk (01/22/2023)  Social Connections: Unknown (06/27/2022)   [facility]  Tobacco Use: Low Risk  (02/03/2023)   SDOH Interventions:     Readmission Risk Interventions     No data to display

## 2023-02-04 NOTE — Plan of Care (Signed)

## 2023-02-04 NOTE — Progress Notes (Signed)
Mobility Specialist Progress Note:    02/04/23 1140  Mobility  Activity Ambulated with assistance in hallway  Level of Assistance Contact guard assist, steadying assist  Assistive Device Other (Comment) (HHA)  Distance Ambulated (ft) 200 ft  Activity Response Tolerated well  Mobility Referral Yes  $Mobility charge 1 Mobility  Mobility Specialist Start Time (ACUTE ONLY) 1126  Mobility Specialist Stop Time (ACUTE ONLY) 1138  Mobility Specialist Time Calculation (min) (ACUTE ONLY) 12 min   Received pt in chair having no complaints and agreeable to mobility. C/o of mild Sob and fatigue, SPO2 on RA WFL. Returned to room w/o fault. Left in bed w/ call bell in reach and all needs met.   Thompson Grayer Mobility Specialist  Please contact vis Secure Chat or  Rehab Office [phone]

## 2023-02-05 ENCOUNTER — Encounter (HOSPITAL_COMMUNITY): Payer: Medicare HMO

## 2023-02-05 DIAGNOSIS — N179 Acute kidney failure, unspecified: Secondary | ICD-10-CM | POA: Diagnosis not present

## 2023-02-05 DIAGNOSIS — I5023 Acute on chronic systolic (congestive) heart failure: Secondary | ICD-10-CM | POA: Diagnosis not present

## 2023-02-05 LAB — BASIC METABOLIC PANEL
Anion gap: 16 — ABNORMAL HIGH (ref 5–15)
BUN: 55 mg/dL — ABNORMAL HIGH (ref 8–23)
CO2: 30 mmol/L (ref 22–32)
Calcium: 9.4 mg/dL (ref 8.9–10.3)
Chloride: 88 mmol/L — ABNORMAL LOW (ref 98–111)
Creatinine, Ser: 1.74 mg/dL — ABNORMAL HIGH (ref 0.44–1.00)
GFR, Estimated: 29 mL/min — ABNORMAL LOW (ref >60)
Glucose, Bld: 91 mg/dL (ref 70–99)
Potassium: 3.2 mmol/L — ABNORMAL LOW (ref 3.5–5.1)
Sodium: 134 mmol/L — ABNORMAL LOW (ref 135–145)

## 2023-02-05 LAB — BRAIN NATRIURETIC PEPTIDE: B Natriuretic Peptide: 3173.1 pg/mL — ABNORMAL HIGH (ref 0.0–100.0)

## 2023-02-05 LAB — MAGNESIUM: Magnesium: 2.5 mg/dL — ABNORMAL HIGH (ref 1.7–2.4)

## 2023-02-05 MED ORDER — POTASSIUM CHLORIDE CRYS ER 20 MEQ PO TBCR
40.0000 meq | EXTENDED_RELEASE_TABLET | Freq: Once | ORAL | Status: AC
  Administered 2023-02-05: 40 meq via ORAL
  Filled 2023-02-05: qty 2

## 2023-02-05 MED ORDER — TORSEMIDE 20 MG PO TABS: 40.0000 mg | ORAL_TABLET | Freq: Every day | ORAL | Status: DC

## 2023-02-05 NOTE — Progress Notes (Addendum)
Advanced Heart Failure Rounding Note  PCP-Cardiologist: Gypsy Balsam, MD   Subjective:    BP soft overnight. SBP 80-90. MAP 68.  Sitting up in chair eating breakfast. RA. Reports improvement of SOB and orthopnea overnight. Husband at bedside attentive to patient.   Objective:   Weight Range: 53.4 kg Body mass index is 23.78 kg/m.   Vital Signs:   Temp:  [97.5 F (36.4 C)-98.1 F (36.7 C)] 97.8 F (36.6 C) (10/25 0707) Pulse Rate:  [65-81] 81 (10/25 0830) Resp:  [18-24] 18 (10/25 0830) BP: (82-134)/(51-77) 98/63 (10/25 0830) SpO2:  [93 %-100 %] 100 % (10/25 0707) Weight:  [53.4 kg] 53.4 kg (10/25 0416) Last BM Date : 02/04/23  Weight change: Filed Weights   02/03/23 1402 02/04/23 0437 02/05/23 0416  Weight: 55.5 kg 55.5 kg 53.4 kg    Intake/Output:   Intake/Output Summary (Last 24 hours) at 02/05/2023 1432 Last data filed at 02/05/2023 1228 Gross per 24 hour  Intake 120 ml  Output 4200 ml  Net -4080 ml      Physical Exam    General:  Well appearing. No resp difficulty HEENT: Normal Neck: Supple. JVP 7cm . Carotids 2+ bilat; no bruits. No lymphadenopathy or thyromegaly appreciated. Cor: PMI nondisplaced. No rubs, gallops or murmurs. Lungs: Clear Abdomen: Soft, nontender, nondistended. No hepatosplenomegaly. No bruits or masses. Good bowel sounds. Extremities: No cyanosis, clubbing, rash, no edema Neuro: Alert & orientedx3, cranial nerves grossly intact. moves all 4 extremities w/o difficulty. Affect pleasant  Telemetry   SR in 80s w frequent PVCs   EKG    N/A  Patient Profile   Nichole Johnson is a 77 y.o. 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 Admitted for CHF exacerbation.  Assessment/Plan   1. Acute on chronic HFrEF/NICM with moderate MR -  Etiology: suspect related to adriamycin toxicity by cMRI shows possible diagonal infarct with no ischemia on adenosine stress. 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 Focal thrombotic stenosis noted in the large OM 3 branch of C without significant CAD. 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 - Volume status improving. Given hypotension would hold diuresis this am - GDMT limited by hypotension and renal function - Continue Farxiga 10 mg daily (started 10/16) - Continue low dose Metoprolol - Start oral torsemide 40mg  daily tomorrow - I do believe that her MR may be moderate to severe, and given the intermittent nature of her symptoms could consider further evaluation with TEE if not improving   2. NSTEMI/CAD - Trop 409>811 - LHC 10/24: 80% thrombotic OM3 lesion otherwise nonobstructive disease to be managed medically - Continue Eliquis 5 bid - Continue Lipitor 40   3. AKI on CKDIIIb - sCr at baseline ~1.1-1.2. stable from yesterday 1.74. - Goal K>4; Mg>2 - Diuresis as above - Follow BMET  4. PVCs - Continue low dose metoprolol   - Goal K>4; Mg>2  5. Reactive Lung Disease/OSA - followed by [facility] pulm, PFTs and nighttime oximetry ordered as outpatient - + orthopnea - nebs per primary   6. Hypothyroid  - Synthroid  Length of Stay: 2  Romie Minus,  MD  02/05/2023, 2:32 PM  Advanced Heart Failure Team Pager [phone] (M-F; 7a - 5p)  Please contact [facility] for night-coverage after hours (5p -7a ) and weekends on [email or web address]

## 2023-02-05 NOTE — TOC Progression Note (Addendum)
Transition of Care [facility]) - Progression Note    Patient Details  Name: Nichole Johnson MRN: [id number] Date of Birth: [birth year]  Transition of Care [facility] CM/SW Contact  Elliot Cousin, RN Phone Number: [phone] 02/05/2023, 4:12 PM  Clinical Narrative: HF TOC CM spoke to pt and offered choice for [facility]. States she had [facility] in the past (medicare.gov list placed on chart). Contacted [employer] rep, Mitch for Rollator for home. Contacted [facility] rep, Tresa Endo with new referral. They can accept referral for [facility]. Family will provide transportation to home.   Rollator delivered to room      Expected Discharge Plan: Home w Home Health Services Barriers to Discharge: No Barriers Identified  Expected Discharge Plan and Services     Post Acute Care Choice: Home Health Living arrangements for the past 2 months: Single Family Home                 DME Arranged: Walker rolling with seat DME Agency: [employer] Date DME Agency Contacted: 02/05/23 Time DME Agency Contacted: [phone] Representative spoke with at DME Agency: Mitch HH Arranged: RN, PT, OT [facility] Agency: [facility] Date [facility] Agency Contacted: 02/05/23 Time HH Agency Contacted: [phone] Representative spoke with at [facility] Agency: Tresa Endo   Social Determinants of Health (SDOH) Interventions SDOH Screenings   Food Insecurity: No Food Insecurity (01/22/2023)  Housing: Low Risk  (01/22/2023)  Transportation Needs: No Transportation Needs (01/22/2023)  Utilities: Not At Risk (01/22/2023)  Social Connections: Unknown (06/27/2022)   Received from [facility]  Tobacco Use: Low Risk  (02/03/2023)    Readmission Risk Interventions     No data to display

## 2023-02-05 NOTE — Plan of Care (Signed)
  Problem: Health Behavior/Discharge Planning: Goal: Ability to manage health-related needs will improve Outcome: Progressing   Problem: Clinical Measurements: Goal: Ability to maintain clinical measurements within normal limits will improve Outcome: Progressing   

## 2023-02-05 NOTE — Progress Notes (Signed)
Advanced Heart Failure Rounding Note  PCP-Cardiologist: Gypsy Balsam, MD   Subjective:    BP soft overnight. SBP 80-90. MAP 68.  Sitting up in chair eating breakfast. RA. Reports improvement of SOB and orthopnea overnight. Husband at bedside attentive to patient.   Objective:   Weight Range: 53.4 kg Body mass index is 23.78 kg/m.   Vital Signs:   Temp:  [97.5 F (36.4 C)-98.1 F (36.7 C)] 97.8 F (36.6 C) (10/25 0707) Pulse Rate:  [65-81] 65 (10/25 0707) Resp:  [18-24] 18 (10/25 0707) BP: (82-134)/(51-77) 90/57 (10/25 0707) SpO2:  [91 %-100 %] 100 % (10/25 0707) Weight:  [53.4 kg] 53.4 kg (10/25 0416) Last BM Date : 02/03/23  Weight change: Filed Weights   02/03/23 1402 02/04/23 0437 02/05/23 0416  Weight: 55.5 kg 55.5 kg 53.4 kg    Intake/Output:   Intake/Output Summary (Last 24 hours) at 02/05/2023 0743 Last data filed at 02/05/2023 0126 Gross per 24 hour  Intake 120 ml  Output 3300 ml  Net -3180 ml      Physical Exam    General:  Well appearing. No resp difficulty HEENT: Normal Neck: Supple. JVP 7cm . Carotids 2+ bilat; no bruits. No lymphadenopathy or thyromegaly appreciated. Cor: PMI nondisplaced. No rubs, gallops or murmurs. Lungs: Clear Abdomen: Soft, nontender, nondistended. No hepatosplenomegaly. No bruits or masses. Good bowel sounds. Extremities: No cyanosis, clubbing, rash, no edema Neuro: Alert & orientedx3, cranial nerves grossly intact. moves all 4 extremities w/o difficulty. Affect pleasant  Telemetry   SR in 80s w frequent PVCs   EKG    N/A  Labs    CBC Recent Labs    02/02/23 1756 02/03/23 0439  WBC 9.9 9.9  HGB 12.5 12.8  HCT 39.6 38.4  MCV 87.8 82.4  PLT 259 229   Basic Metabolic Panel Recent Labs    82/95/62 0439 02/04/23 0347 02/05/23 0359  NA 133* 133* 134*  K 3.1* 5.3* 3.2*  CL 93* 98 88*  CO2 22 20* 30  GLUCOSE 125* 119* 91  BUN 45* 48* 55*  CREATININE 1.49* 1.77* 1.74*  CALCIUM 9.5 9.4 9.4   MG 2.5* 2.8* 2.5*  PHOS 5.0*  --   --    Liver Function Tests No results for input(s): "AST", "ALT", "ALKPHOS", "BILITOT", "PROT", "ALBUMIN" in the last 72 hours. No results for input(s): "LIPASE", "AMYLASE" in the last 72 hours. Cardiac Enzymes No results for input(s): "CKTOTAL", "CKMB", "CKMBINDEX", "TROPONINI" in the last 72 hours.  BNP: BNP (last 3 results) Recent Labs    02/02/23 1756 02/04/23 0347 02/05/23 0359  BNP 3,869.9* 4,354.8* 3,173.1*    ProBNP (last 3 results) Recent Labs    08/20/22 1539 09/08/22 1308 01/18/23 1326  PROBNP 4,330* 5,154* 9,910*     D-Dimer No results for input(s): "DDIMER" in the last 72 hours. Hemoglobin A1C No results for input(s): "HGBA1C" in the last 72 hours. Fasting Lipid Panel No results for input(s): "CHOL", "HDL", "LDLCALC", "TRIG", "CHOLHDL", "LDLDIRECT" in the last 72 hours. Thyroid Function Tests No results for input(s): "TSH", "T4TOTAL", "T3FREE", "THYROIDAB" in the last 72 hours.  Invalid input(s): "FREET3"  Other results:   Imaging    No results found.   Medications:     Scheduled Medications:  apixaban  5 mg Oral BID   ascorbic acid  500 mg Oral Daily   atorvastatin  40 mg Oral Daily   brinzolamide  1 drop Both Eyes BID   budesonide  0.25 mg Nebulization BID  cyanocobalamin  1,000 mcg Oral Daily   dapagliflozin propanediol  10 mg Oral Daily   fluticasone  2 spray Each Nare Daily   furosemide  80 mg Intravenous BID   latanoprost  1 drop Both Eyes QHS   levothyroxine  100 mcg Oral Daily   metoprolol succinate  25 mg Oral Daily   potassium chloride  40 mEq Oral Once    Infusions:   PRN Medications: acetaminophen, albuterol, LORazepam, melatonin, polyethylene glycol, prochlorperazine    Patient Profile   Khiley Cardullo is a 77 y.o. 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 Admitted for CHF exacerbation.  Assessment/Plan   1. Acute on chronic HFrEF/NICM with moderate MR - Etiology: suspect related to adriamycin toxicity by cMRI shows possible diagonal infarct with no ischemia on adenosine stress. 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 Focal thrombotic stenosis noted in the large OM 3 branch of C without significant CAD. 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 - Volume status improving. Given hypotension would hold diuresis this am and transition to PO this afternoon or tomorrow. - GDMT limited by hypotension and renal function - Continue Farxiga 10 mg daily (started 10/16) - Continue low dose Metoprolol - Discussed need for possible RHC if symptoms do not improve with diuresis. Patient and family were not amenable to future heart caths at this time.   2. NSTEMI/CAD - Trop 295>284 - LHC 10/24: 80% OM3 lesion otherwise nonobstructive disease to be managed medically - ? Cath note outlines question of Covid myocarditis and use of Eliquis; Ultimately it was determined she would be discharged on Aspirin monotherapy. - Continue Eliquis 5 bid - Continue Lipitor 40   3. AKI on CKDIIIb - sCr at baseline ~1.1-1.2. stable from yesterday 1.74. - Goal K>4; Mg>2 - Diuresis as above - Follow BMET  4. PVCs - Continue low dose metoprolol   - Goal K>4; Mg>2  5. Reactive Lung Disease/OSA - followed by [facility] pulm, PFTs and nighttime oximetry ordered as outpatient - + orthopnea - nebs per primary   6. Hypothyroid  - Synthroid  Length of Stay: 2  [place] Ulmer Degen, NP  02/05/2023, 7:43 AM  Advanced Heart Failure Team Pager [phone] (M-F; 7a - 5p)  Please  contact [facility] for night-coverage after hours (5p -7a ) and weekends on [email or web address]

## 2023-02-05 NOTE — Progress Notes (Signed)
Mobility Specialist Progress Note:   02/05/23 1429  Mobility  Activity Ambulated with assistance in hallway  Level of Assistance Contact guard assist, steadying assist  Assistive Device Front wheel walker  Distance Ambulated (ft) 300 ft  Activity Response Tolerated well  Mobility Referral Yes  $Mobility charge 1 Mobility  Mobility Specialist Start Time (ACUTE ONLY) 1402  Mobility Specialist Stop Time (ACUTE ONLY) 1417  Mobility Specialist Time Calculation (min) (ACUTE ONLY) 15 min   Received pt in bed having no complaints and agreeable to mobility. Pt was asymptomatic throughout ambulation and returned to room w/o fault. Ambulated on RA throughout session SPO2 WFL, no SOB. Left in bed w/ call bell in reach and all needs met.  Pre Mobility RA SPO2 93% During Mobility RA SPO2 94% Post Mobility RA SPO2 98%  Thompson Grayer Mobility Specialist  Please contact vis Secure Chat or  Rehab Office [phone]

## 2023-02-05 NOTE — Progress Notes (Signed)
Physical Therapy Treatment Patient Details Name: Nichole Johnson MRN: [id number] DOB: Aug 23, 1945 Today's Date: 02/05/2023   History of Present Illness Pt is a 77 y/o female admitted 10/22 to the ED from home due to worsening exertional dyspnea.  Work up includes Acute on Chronic systolic CHF.  PMHx:  including but not limited to  sCHF, AKI, Breast CA, cardiomyopathy, HTN, bil TKA.    PT Comments  Pt received in supine, pleasantly agreeable to therapy session and with good participation and tolerance for gait and stair negotiation, pt needing up to Supervision to perform all tasks. Pt steadier with use of rollator and instructed on use of brakes, needed reminders prior to transfers. Reinforced importance of going through CHF instructional packet including fluid intake, pt states she has packet at home. Pt continues to benefit from PT services to progress toward functional mobility goals, anticipate she is close to functional baseline and ok for DC home from a functional standpoint once medically cleared, will continue to follow acutely.     If plan is discharge home, recommend the following: A little help with bathing/dressing/bathroom;Assistance with cooking/housework;Assist for transportation   Can travel by private vehicle        Equipment Recommendations  None recommended by PT ((rollator already delivered to her room))    Recommendations for Other Services       Precautions / Restrictions Precautions Precautions: Fall Restrictions Weight Bearing Restrictions: No     Mobility  Bed Mobility Overal bed mobility: Modified Independent             General bed mobility comments: to R EOB from supine    Transfers Overall transfer level: Needs assistance Equipment used: Rollator (4 wheels) Transfers: Sit to/from Stand Sit to Stand: Supervision           General transfer comment: cues for use of rollator brakes each transfer    Ambulation/Gait Ambulation/Gait assistance:  Supervision Gait Distance (Feet): 300 Feet Assistive device: Rollator (4 wheels) Gait Pattern/deviations: Step-through pattern Gait velocity: grossly >0.4 m/s, functional     General Gait Details: Good step-through gait pattern and functional cadence/gait speed using rollator. No acute s/sx distress, min cues for use of brakes prior to sitting or switching from rollator to wall railing when performing stairs. Pt did not need seated break to achieve this distance.   Stairs Stairs: Yes Stairs assistance: Supervision Stair Management: One rail Left, Alternating pattern, Step to pattern, Forwards Number of Stairs: 10 General stair comments: pt with reciprocal pattern ascending, some reciprocal and some step-to pattern descending. Mildly decreased stability when descending so reinforced wtih pt that step-to pattern is safer descending for stability esp given that she has soft BP at baseline. Pt without c/o dizziness.   Wheelchair Mobility     Tilt Bed    Modified Rankin (Stroke Patients Only)       Balance Overall balance assessment: Needs assistance Sitting-balance support: No upper extremity supported, Feet supported Sitting balance-Leahy Scale: Good     Standing balance support: No upper extremity supported, Single extremity supported, During functional activity Standing balance-Leahy Scale: Fair Standing balance comment: steadier gait with rollator                            Cognition Arousal: Alert Behavior During Therapy: WFL for tasks assessed/performed Overall Cognitive Status: Within Functional Limits for tasks assessed  General Comments: Pt expressed concerns about hygiene and lack of cleanliness of linens in her room, pt RN notified. Pt polite and cooperative throughout.        Exercises      General Comments General comments (skin integrity, edema, etc.): Pt instructed on HF things to keep in mind such  as how to track oral intake (measuring her regular cup she uses to know how many mL and putting line on cup for total amt per day to track intake and ensure she is not getting fluid overloaded, etc). Pt states she has CHF packet at home from last admission, PTA reinforced she read through and highlight important items such as fluid restrictions, salt intake, daily weights, etc.      Pertinent Vitals/Pain Pain Assessment Pain Assessment: No/denies pain Faces Pain Scale: No hurt    Home Living                          Prior Function            PT Goals (current goals can now be found in the care plan section) Acute Rehab PT Goals Patient Stated Goal: back to "Pre-vacation to [place]" function. PT Goal Formulation: With patient Time For Goal Achievement: 02/18/23 Potential to Achieve Goals: Good Progress towards PT goals: Progressing toward goals    Frequency    Min 1X/week      PT Plan      Co-evaluation              AM-PAC PT "6 Clicks" Mobility   Outcome Measure  Help needed turning from your back to your side while in a flat bed without using bedrails?: None Help needed moving from lying on your back to sitting on the side of a flat bed without using bedrails?: None Help needed moving to and from a bed to a chair (including a wheelchair)?: A Little Help needed standing up from a chair using your arms (e.g., wheelchair or bedside chair)?: A Little Help needed to walk in hospital room?: A Little Help needed climbing 3-5 steps with a railing? : A Little 6 Click Score: 20    End of Session Equipment Utilized During Treatment: Gait belt Activity Tolerance: Patient tolerated treatment well Patient left: in bed;with call bell/phone within reach;with family/visitor present Nurse Communication: Mobility status PT Visit Diagnosis: Unsteadiness on feet (R26.81);Muscle weakness (generalized) (M62.81)     Time: 0160-1093 PT Time Calculation (min) (ACUTE  ONLY): 18 min  Charges:    $Gait Training: 8-22 mins PT General Charges $$ ACUTE PT VISIT: 1 Visit                     Helaina Stefano P., PTA Acute Rehabilitation Services Secure Chat Preferred 9a-5:30pm Office: [phone]    [place] 02/05/2023, 6:10 PM

## 2023-02-05 NOTE — Progress Notes (Signed)
PROGRESS NOTE    Nichole Johnson  WGN:[id number] DOB: 04-Feb-1946 DOA: 02/02/2023 PCP: [name], MD    Brief Narrative:   Nichole Johnson is a 77 y.o. 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  Reportedly only can ambulate a few feet before getting severely short winded.  Also notable for orthopnea.  Denies chest pain, no significant lower extremity edema, no weight gain.  Reports compliance with her home cardiac medications.  In the ED, Temperature 98.8. BP 98/65, pulse 80, respiratory 18, O2 saturation 100% on room air.  WBC 9.9, hemoglobin 12.5, platelet count 259.  Sodium 132, serum bicarb 20, BUN 46, creatinine 1.64, GFR 32. BNP 3869. Troponin 265, 239.  BNP 3869.9.  Chest x-ray with cardiomegaly with mild central vascular pulmonary congestion, large hiatal hernia.  EKG with NSR, rate 85, QTc 459, no concerning ST elevation/depressions or T wave inversions.  Patient received IV Lasix 80 mg x 1 in the ED.  Cardiology consulted.  TRH consulted for admission for further evaluation and management of acute on chronic systolic congestive heart failure, acute renal failure on CKD stage IIIa.  Assessment & Plan:   Acute on chronic systolic congestive heart failure History of nonischemic cardiomyopathy Patient presenting with progressive shortness of breath.  History of NI CM with LVEF 25%, recent NSTEMI and recently discharged by the cardiology service on 01/26/2023.  Patient reports compliance with her home medications, no significant lower extremity edema and weights have been stable.  Patient endorses orthopnea, dyspnea on exertion.  BNP elevated 3169, chest x-ray with  vascular pulmonary congestion.  Recent TTE 10/12 with LVEF 25-30%, LV hypokinesis, grade 3 diastolic dysfunction, moderate MR. -- Cardiology and advanced heart failure team following, appreciate assistance -- net negative 3.2L past 24h and net negative 4.2L since admission -- Metoprolol succinate 25 mg p.o. daily -- Furosemide 80 mg IV every 12 hours -- s/p metolazone 5mg  PO x 1 on 10/24 -- Unable to tolerate further GDMT due to BP -- Strict I's and O's and daily weights -- Monitor BMP daily -- Patient has turned down further heart catheterization/RHC for now  Acute renal failure on CKD stage IIIa Patient presenting with a creatinine elevated 1.64 with baseline 1.2.  Etiology likely secondary to volume overload, cardiorenal syndrome. -- Cr 1.64>>1.49>1.77>1.74 -- Continue IV diuresis as above -- Avoid nephrotoxins, renally dose all medications -- BMP daily  Hypokalemia Potassium 3.2, replete today -- BMP daily  Coronary thrombus noted on left heart catheterization 01/25/2023 -- Eliquis 5 mg p.o. twice daily  Hypothyroidism -- Levothyroxine 100 mcg p.o. daily  Hyperlipidemia -- Atorvastatin 40 mg p.o. daily  Essential hypertension -- Metoprolol succinate 25 mg p.o. daily  Moderate persistent reactive airway disease Follows with pulmonology at [facility], Helene Kelp, PA.  On Qvar inhaler outpatient. -- Budesonide neb twice daily -- Albuterol neb every 6 hours as needed wheezing/shortness of breath   DVT prophylaxis:  apixaban (ELIQUIS) tablet 5 mg    Code Status: Full Code Family Communication: Updated husband present at bedside this morning  Disposition Plan:  Level of care: Telemetry Cardiac Status is: Inpatient Remains inpatient appropriate because: IV diuresis    Consultants:  Cardiology/advanced heart failure team  Procedures:  None  Antimicrobials:  None   Subjective: Patient seen examined bedside, resting calmly.  Sitting in bedside chair.  Husband  present.  Reports shortness of breath much improved after receiving metolazone yesterday.  Remains off of oxygen.  Continues on IV diuresis with Lasix.  Husband concerned about transition to oral diuretics and needs to be "titrated well before going home this time".  No other specific questions or concerns at this time. Denies headache, no dizziness, no chest pain, no palpitations, no fever/chills/night sweats, no nausea/vomiting/diarrhea, no focal weakness, no fatigue, no paresthesias.  No acute events overnight per nursing staff.  Objective: Vitals:   02/05/23 0045 02/05/23 0416 02/05/23 0707 02/05/23 0830  BP: (!) 82/51 98/62 (!) 90/57 98/63  Pulse: 73 79 65 81  Resp: (!) 24 (!) 21 18 18   Temp: (!) 97.5 F (36.4 C) 97.6 F (36.4 C) 97.8 F (36.6 C)   TempSrc: Oral Oral Oral   SpO2: 99% 98% 100%   Weight:  53.4 kg    Height:        Intake/Output Summary (Last 24 hours) at 02/05/2023 1056 Last data filed at 02/05/2023 1030 Gross per 24 hour  Intake 120 ml  Output 4350 ml  Net -4230 ml   Filed Weights   02/03/23 1402 02/04/23 0437 02/05/23 0416  Weight: 55.5 kg 55.5 kg 53.4 kg    Examination:  Physical Exam: GEN: NAD, alert and oriented x 3, wd/wn HEENT: NCAT, PERRL, EOMI, sclera clear, MMM PULM: Crackles noted bilateral bases, no wheezing, normal respiratory effort without accessory muscle use, on room air with SpO2 100% at rest CV: RRR w/o M/G/R GI: abd soft, NTND, + BS MSK: Trace-1+ bilateral lower extremity peripheral edema, muscle strength globally intact 5/5 bilateral upper/lower extremities NEURO: CN II-XII intact, no focal deficits, sensation to light touch intact PSYCH: normal mood/affect Integumentary: dry/intact, no rashes or wounds    Data Reviewed: I have personally reviewed following labs and imaging studies  CBC: Recent Labs  Lab 02/02/23 1756 02/03/23 0439  WBC 9.9 9.9  HGB 12.5 12.8  HCT 39.6 38.4  MCV 87.8 82.4  PLT 259 229   Basic Metabolic  Panel: Recent Labs  Lab 02/02/23 1756 02/03/23 0439 02/04/23 0347 02/05/23 0359  NA 132* 133* 133* 134*  K 4.0 3.1* 5.3* 3.2*  CL 95* 93* 98 88*  CO2 20* 22 20* 30  GLUCOSE 112* 125* 119* 91  BUN 46* 45* 48* 55*  CREATININE 1.64* 1.49* 1.77* 1.74*  CALCIUM 9.4 9.5 9.4 9.4  MG  --  2.5* 2.8* 2.5*  PHOS  --  5.0*  --   --    GFR: Estimated Creatinine Clearance: 19.6 mL/min (A) (by C-G formula based on SCr of 1.74 mg/dL (H)). Liver Function Tests: No results for input(s): "AST", "ALT", "ALKPHOS", "BILITOT", "PROT", "ALBUMIN" in the last 168 hours. No results for input(s): "LIPASE", "AMYLASE" in the last 168 hours. No results for input(s): "AMMONIA" in the last 168 hours. Coagulation Profile: No results for input(s): "INR", "PROTIME" in the last 168 hours. Cardiac Enzymes: No results for input(s): "CKTOTAL", "CKMB", "CKMBINDEX", "TROPONINI" in the last 168 hours. BNP (last 3 results) Recent Labs    08/20/22 1539 09/08/22 1308 01/18/23 1326  PROBNP 4,330* 5,154* 9,910*   HbA1C: No results for input(s): "HGBA1C" in the last 72 hours. CBG: No results for input(s): "GLUCAP" in the last 168 hours. Lipid Profile: No results for input(s): "CHOL", "HDL", "LDLCALC", "TRIG", "CHOLHDL", "LDLDIRECT" in the last 72 hours. Thyroid Function Tests: No  results for input(s): "TSH", "T4TOTAL", "FREET4", "T3FREE", "THYROIDAB" in the last 72 hours. Anemia Panel: No results for input(s): "VITAMINB12", "FOLATE", "FERRITIN", "TIBC", "IRON", "RETICCTPCT" in the last 72 hours. Sepsis Labs: No results for input(s): "PROCALCITON", "LATICACIDVEN" in the last 168 hours.  No results found for this or any previous visit (from the past 240 hour(s)).       Radiology Studies: No results found.      Scheduled Meds:  apixaban  5 mg Oral BID   ascorbic acid  500 mg Oral Daily   atorvastatin  40 mg Oral Daily   brinzolamide  1 drop Both Eyes BID   budesonide  0.25 mg Nebulization BID    cyanocobalamin  1,000 mcg Oral Daily   dapagliflozin propanediol  10 mg Oral Daily   fluticasone  2 spray Each Nare Daily   latanoprost  1 drop Both Eyes QHS   levothyroxine  100 mcg Oral Daily   metoprolol succinate  25 mg Oral Daily   Continuous Infusions:   LOS: 2 days    Time spent: 53 minutes spent on chart review, discussion with nursing staff, consultants, updating family and interview/physical exam; more than 50% of that time was spent in counseling and/or coordination of care.    Alvira Philips [place], DO [facility] Available via Epic secure chat 7am-7pm After these hours, please refer to coverage provider listed on [email or web address] 02/05/2023, 10:56 AM

## 2023-02-05 NOTE — Progress Notes (Signed)
Mobility Specialist Progress Note:  Nurse requested Mobility Specialist to perform oxygen saturation test with pt which includes removing pt from oxygen both at rest and while ambulating.  Below are the results from that testing.     Patient Saturations on Room Air at Rest = spO2 93%  Patient Saturations on Room Air while Ambulating = sp02 94% .    At end of testing pt left in room on 0  Liters of oxygen.  Reported results to nurse.    Thompson Grayer Mobility Specialist  Please contact vis Secure Chat or  Rehab Office [phone]

## 2023-02-06 DIAGNOSIS — I5021 Acute systolic (congestive) heart failure: Secondary | ICD-10-CM | POA: Diagnosis not present

## 2023-02-06 DIAGNOSIS — N179 Acute kidney failure, unspecified: Secondary | ICD-10-CM | POA: Diagnosis not present

## 2023-02-06 LAB — BASIC METABOLIC PANEL
Anion gap: 16 — ABNORMAL HIGH (ref 5–15)
BUN: 54 mg/dL — ABNORMAL HIGH (ref 8–23)
CO2: 32 mmol/L (ref 22–32)
Calcium: 9.3 mg/dL (ref 8.9–10.3)
Chloride: 86 mmol/L — ABNORMAL LOW (ref 98–111)
Creatinine, Ser: 1.73 mg/dL — ABNORMAL HIGH (ref 0.44–1.00)
GFR, Estimated: 30 mL/min — ABNORMAL LOW (ref >60)
Glucose, Bld: 93 mg/dL (ref 70–99)
Potassium: 2.7 mmol/L — CL (ref 3.5–5.1)
Sodium: 134 mmol/L — ABNORMAL LOW (ref 135–145)

## 2023-02-06 LAB — POTASSIUM: Potassium: 3.8 mmol/L (ref 3.5–5.1)

## 2023-02-06 LAB — MAGNESIUM: Magnesium: 2.5 mg/dL — ABNORMAL HIGH (ref 1.7–2.4)

## 2023-02-06 MED ORDER — POTASSIUM CHLORIDE 10 MEQ/100ML IV SOLN
10.0000 meq | INTRAVENOUS | Status: DC
  Administered 2023-02-06 (×2): 10 meq via INTRAVENOUS
  Filled 2023-02-06 (×2): qty 100

## 2023-02-06 MED ORDER — POTASSIUM CHLORIDE CRYS ER 20 MEQ PO TBCR
40.0000 meq | EXTENDED_RELEASE_TABLET | ORAL | Status: AC
  Administered 2023-02-06: 40 meq via ORAL
  Filled 2023-02-06: qty 2

## 2023-02-06 MED ORDER — POTASSIUM CHLORIDE CRYS ER 20 MEQ PO TBCR
40.0000 meq | EXTENDED_RELEASE_TABLET | Freq: Once | ORAL | Status: AC
  Administered 2023-02-06: 40 meq via ORAL
  Filled 2023-02-06: qty 4

## 2023-02-06 MED ORDER — POTASSIUM CHLORIDE CRYS ER 20 MEQ PO TBCR: 40.0000 meq | EXTENDED_RELEASE_TABLET | Freq: Once | ORAL | Status: DC

## 2023-02-06 MED ORDER — LORAZEPAM 0.5 MG PO TABS: 0.5000 mg | ORAL_TABLET | Freq: Four times a day (QID) | ORAL | Status: DC | PRN

## 2023-02-06 MED ORDER — POTASSIUM CHLORIDE CRYS ER 20 MEQ PO TBCR: 40.0000 meq | EXTENDED_RELEASE_TABLET | ORAL | Status: DC

## 2023-02-06 NOTE — Progress Notes (Signed)
Cardiology Progress Note  Patient ID: Nichole Johnson MRN: [id number] DOB: May 25, 1945 Date of Encounter: 02/06/2023 Primary Cardiologist: Gypsy Balsam, MD  Subjective   Chief Complaint: None.   HPI: Breathing much improved.  Potassium very low.  ROS:  All other ROS reviewed and negative. Pertinent positives noted in the HPI.     Vital Signs   Vitals:   02/06/23 0719 02/06/23 0720 02/06/23 0729 02/06/23 0840  BP:  91/60    Pulse:  84  86  Resp:  (!) 23 17 16   Temp:  97.8 F (36.6 C)    TempSrc: Oral Oral    SpO2:  98%  96%  Weight:      Height:        Intake/Output Summary (Last 24 hours) at 02/06/2023 0911 Last data filed at 02/05/2023 1903 Gross per 24 hour  Intake 220 ml  Output 1650 ml  Net -1430 ml      02/06/2023    5:22 AM 02/05/2023    4:16 AM 02/04/2023    4:37 AM  Last 3 Weights  Weight (lbs) 114 lb 8 oz 117 lb 11.6 oz 122 lb 5.7 oz  Weight (kg) 51.937 kg 53.4 kg 55.5 kg      Telemetry  Overnight telemetry shows sinus rhythm 70s with PVCs, which I personally reviewed.   Physical Exam   Vitals:   02/06/23 0719 02/06/23 0720 02/06/23 0729 02/06/23 0840  BP:  91/60    Pulse:  84  86  Resp:  (!) 23 17 16   Temp:  97.8 F (36.6 C)    TempSrc: Oral Oral    SpO2:  98%  96%  Weight:      Height:        Intake/Output Summary (Last 24 hours) at 02/06/2023 0911 Last data filed at 02/05/2023 1903 Gross per 24 hour  Intake 220 ml  Output 1650 ml  Net -1430 ml       02/06/2023    5:22 AM 02/05/2023    4:16 AM 02/04/2023    4:37 AM  Last 3 Weights  Weight (lbs) 114 lb 8 oz 117 lb 11.6 oz 122 lb 5.7 oz  Weight (kg) 51.937 kg 53.4 kg 55.5 kg    Body mass index is 23.13 kg/m.  General: Well nourished, well developed, in no acute distress Head: Atraumatic, normal size  Eyes: PEERLA, EOMI  Neck: Supple, no JVD Endocrine: No thryomegaly Cardiac: Normal S1, S2; RRR; no murmurs, rubs, or gallops Lungs: Clear to auscultation bilaterally, no  wheezing, rhonchi or rales  Abd: Soft, nontender, no hepatomegaly  Ext: No edema, pulses 2+ Musculoskeletal: No deformities, BUE and BLE strength normal and equal Skin: Warm and dry, no rashes   Neuro: Alert and oriented to person, place, time, and situation, CNII-XII grossly intact, no focal deficits  Psych: Normal mood and affect   Cardiac Studies  Cardiac Cath 01/25/2023 Right & Left Heart Catheterization 01/25/23: Hemodynamic data: LV 100/2, EDP 9 mmHg.  Ao 148/66, mean 95 mmHg.  No pressure gradient across the aortic valve. RA: 3/2, mean 2 mmHg. RV 37/2, EDP 5 mmHg. PA 36/15, mean 22 mmHg.  PA saturation 64%. PW 12/12, mean 10 mmHg.  Aortic saturation 94%. QP/QS 1.00. CO 4.3, CI 2.86 by Fick.   Angiographic data: LM: Large-caliber vessel.  It smooth and normal. LAD: Large-caliber vessel, gives origin to a moderate-sized D1 and several small diagonals.  It is small and normal. LCx: Large-caliber vessel giving origin to a very tiny  OM1 and OM 2 and large OM 3.  Moderate sized AV groove circumflex.  The OM 3 has a focal 80% thrombotic lesion that appears to be organized.  There is TIMI-3 flow distally. RCA: Moderate caliber vessel, dominant, smooth and normal.  Patient Profile  Nichole Johnson is a 77 y.o. 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   Assessment & Plan   # Acute on chronic systolic heart failure, EF 25% # Moderate to severe mitral valve regurgitation # Hypokalemia -Net -6.6 L since admission.  No further IV diuresis. -Plan was to transition to oral diuretics today.  However her potassium is very low.  Hold further diuresis until potassium is normalized.  Would recommend to recheck potassium this afternoon.  She could likely be placed on oral diuretics as long as her potassium is normalized and hopefully discharge  tomorrow. -Intolerant to GDMT due to hypotension.  Continue home beta-blocker metoprolol succinate 25 mg daily and Jardiance.  She has not really tolerated other medications. -Consideration of outpatient TEE for moderate to severe mitral valve regurgitation.  She may see benefit given repeated hospitalizations.  # CKD stage IIIb # AKI -Creatinine still slightly elevated.  We will follow this for now.  # Non-STEMI # Thromboembolic CAD event -Managed medically.  On Eliquis 5 mg twice daily.  Continue Lipitor.      For questions or updates, please contact [facility] Please consult [email or web address] for contact info under        Signed, Gerri Spore T. Flora Lipps, MD, [facility]  [facility]  02/06/2023 9:11 AM

## 2023-02-06 NOTE — Plan of Care (Signed)
  Problem: Elimination: Goal: Will not experience complications related to bowel motility Outcome: Completed/Met Goal: Will not experience complications related to urinary retention Outcome: Completed/Met

## 2023-02-06 NOTE — Progress Notes (Signed)
Mobility Specialist Progress Note    02/06/23 1034  Mobility  Activity Ambulated with assistance in hallway  Level of Assistance Standby assist, set-up cues, supervision of patient - no hands on  Assistive Device Four wheel walker  Distance Ambulated (ft) 300 ft  Activity Response Tolerated well  Mobility Referral Yes  $Mobility charge 1 Mobility  Mobility Specialist Start Time (ACUTE ONLY) 1022  Mobility Specialist Stop Time (ACUTE ONLY) 1032  Mobility Specialist Time Calculation (min) (ACUTE ONLY) 10 min   Pre-Mobility: 85 HR Post-Mobility: 93 HR  Pt received in bed and agreeable. C/o fatigue from having a rough night. Returned to sitting EOB with call bell in reach.   [place] Mobility Specialist  Please Neurosurgeon or Rehab Office at [phone]

## 2023-02-06 NOTE — Progress Notes (Signed)
   02/06/23 0720  Assess: MEWS Score  Temp 97.8 F (36.6 C)  BP 91/60  MAP (mmHg) 70  Pulse Rate 84  ECG Heart Rate 83  Resp (!) 23  SpO2 98 %  O2 Device Nasal Cannula  Assess: MEWS Score  MEWS Temp 0  MEWS Systolic 1  MEWS Pulse 0  MEWS RR 1  MEWS LOC 0  MEWS Score 2  MEWS Score Color Yellow  Assess: if the MEWS score is Yellow or Red  Were vital signs accurate and taken at a resting state? No, vital signs rechecked  Does the patient meet 2 or more of the SIRS criteria? No  MEWS guidelines implemented   (rechecked VS, pt was up before VS taken)  Assess: SIRS CRITERIA  SIRS Temperature  0  SIRS Pulse 0  SIRS Respirations  1  SIRS WBC 0  SIRS Score Sum  1

## 2023-02-06 NOTE — Progress Notes (Signed)
   02/06/23 1700  Assess: MEWS Score  Temp 98.7 F (37.1 C)  BP 97/68  MAP (mmHg) 78  Pulse Rate 86  ECG Heart Rate 83  Resp (!) 25  SpO2 95 %  O2 Device Room Air  Assess: MEWS Score  MEWS Temp 0  MEWS Systolic 1  MEWS Pulse 0  MEWS RR 1  MEWS LOC 0  MEWS Score 2  MEWS Score Color Yellow  Assess: if the MEWS score is Yellow or Red  Were vital signs accurate and taken at a resting state? No, vital signs rechecked  Does the patient meet 2 or more of the SIRS criteria? No  MEWS guidelines implemented   (pt was getting up earlier, pt is SOB on exertion)  Assess: SIRS CRITERIA  SIRS Temperature  0  SIRS Pulse 0  SIRS Respirations  1  SIRS WBC 0  SIRS Score Sum  1

## 2023-02-06 NOTE — Progress Notes (Signed)
PROGRESS NOTE    Nichole Johnson  UYQ:[id number] DOB: 09-02-1945 DOA: 02/02/2023 PCP: [name], MD    Brief Narrative:   Nichole Johnson is a 77 y.o. female with past medical history significant for chronic systolic congestive heart failure, nonischemic cardiomyopathy (LVEF 25%), recent NSTEMI, HTN, CKD stage IIIa, coronary thrombus on LHC, HTN, osteoarthritis s/p bilateral knee replacement, recently admitted by the cardiology service from 10/10 - 01/26/2023 who presented to [facility] ED from home with worsening exertional dyspnea.  Reportedly only can ambulate a few feet before getting severely short winded.  Also notable for orthopnea.  Denies chest pain, no significant lower extremity edema, no weight gain.  Reports compliance with her home cardiac medications.  In the ED, Temperature 98.8. BP 98/65, pulse 80, respiratory 18, O2 saturation 100% on room air.  WBC 9.9, hemoglobin 12.5, platelet count 259.  Sodium 132, serum bicarb 20, BUN 46, creatinine 1.64, GFR 32. BNP 3869. Troponin 265, 239.  BNP 3869.9.  Chest x-ray with cardiomegaly with mild central vascular pulmonary congestion, large hiatal hernia.  EKG with NSR, rate 85, QTc 459, no concerning ST elevation/depressions or T wave inversions.  Patient received IV Lasix 80 mg x 1 in the ED.  Cardiology consulted.  TRH consulted for admission for further evaluation and management of acute on chronic systolic congestive heart failure, acute renal failure on CKD stage IIIa.  Assessment & Plan:   Acute on chronic systolic congestive heart failure History of nonischemic cardiomyopathy Patient presenting with progressive shortness of breath.  History of NI CM with LVEF 25%, recent NSTEMI and recently discharged by the cardiology service on 01/26/2023.  Patient reports compliance with her home medications, no significant lower extremity edema and weights have been stable.  Patient endorses orthopnea, dyspnea on exertion.  BNP elevated 3169, chest x-ray with  vascular pulmonary congestion.  Recent TTE 10/12 with LVEF 25-30%, LV hypokinesis, grade 3 diastolic dysfunction, moderate MR. -- Cardiology and advanced heart failure team following, appreciate assistance -- net negative 2.4L past 24h and net negative 6.7L since admission -- wt 122>>114lbs -- Metoprolol succinate 25 mg p.o. daily -- s/p metolazone 5mg  PO x 1 on 10/24 -- further Diuresis on hold today secondary to hypokalemia per cardiology -- Unable to tolerate further GDMT due to BP -- Strict I's and O's and daily weights -- Monitor BMP daily -- Patient has turned down further heart catheterization/RHC for now, ? TEE outpatient  Acute renal failure on CKD stage IIIa Patient presenting with a creatinine elevated 1.64 with baseline 1.2.  Etiology likely secondary to volume overload, cardiorenal syndrome. -- Cr 1.64>>1.49>1.77>1.74 -- Continue IV diuresis as above -- Avoid nephrotoxins, renally dose all medications -- BMP daily  Hypokalemia Potassium 2.7, replete today -- repeat K at 1600 today -- BMP daily  Coronary thrombus noted on left heart catheterization 01/25/2023 -- Eliquis 5 mg p.o. twice daily  Hypothyroidism -- Levothyroxine 100 mcg p.o. daily  Hyperlipidemia -- Atorvastatin 40 mg p.o. daily  Essential hypertension -- Metoprolol succinate 25 mg p.o. daily  Moderate persistent reactive airway disease Follows with pulmonology at [facility], Helene Kelp, PA.  On Qvar inhaler outpatient. -- Budesonide neb twice daily -- Albuterol neb every 6 hours as needed wheezing/shortness of breath   DVT prophylaxis:  apixaban (ELIQUIS) tablet 5 mg    Code Status: Full Code Family Communication: No family present at bedside this morning, updated husband extensively at bedside yesterday  Disposition Plan:  Level of care: Telemetry Cardiac Status is: Inpatient Remains  inpatient appropriate because: Needs aggressive potassium repletion today, await further recommendations from  cardiology    Consultants:  Cardiology/advanced heart failure team  Procedures:  None  Antimicrobials:  None   Subjective: Patient seen examined bedside, resting calmly.  Lying in bed.  RN present.  Breathing remains much improved.  Complaining about IV potassium, burning in her arm.  Potassium low at 2.7 today, will continue aggressive oral repletion with repeat potassium this afternoon.  Cardiology holding further diuresis today given low potassium level and hypotension. No other specific questions or concerns at this time. Denies headache, no dizziness, no chest pain, no palpitations, no fever/chills/night sweats, no nausea/vomiting/diarrhea, no focal weakness, no fatigue, no paresthesias.  No acute events overnight per nursing staff.  Objective: Vitals:   02/06/23 0720 02/06/23 0729 02/06/23 0840 02/06/23 0919  BP: 91/60   96/60  Pulse: 84  86 90  Resp: (!) 23 17 16    Temp: 97.8 F (36.6 C)     TempSrc: Oral   Oral  SpO2: 98%  96%   Weight:      Height:        Intake/Output Summary (Last 24 hours) at 02/06/2023 1024 Last data filed at 02/06/2023 0900 Gross per 24 hour  Intake 320 ml  Output 1800 ml  Net -1480 ml   Filed Weights   02/04/23 0437 02/05/23 0416 02/06/23 0522  Weight: 55.5 kg 53.4 kg 51.9 kg    Examination:  Physical Exam: GEN: NAD, alert and oriented x 3, wd/wn HEENT: NCAT, PERRL, EOMI, sclera clear, MMM PULM: Crackles noted bilateral bases, no wheezing, normal respiratory effort without accessory muscle use, on room air with SpO2 100% at rest CV: RRR w/o M/G/R GI: abd soft, NTND, + BS MSK: Trace-1+ bilateral lower extremity peripheral edema, muscle strength globally intact 5/5 bilateral upper/lower extremities NEURO: CN II-XII intact, no focal deficits, sensation to light touch intact PSYCH: normal mood/affect Integumentary: dry/intact, no rashes or wounds    Data Reviewed: I have personally reviewed following labs and imaging  studies  CBC: Recent Labs  Lab 02/02/23 1756 02/03/23 0439  WBC 9.9 9.9  HGB 12.5 12.8  HCT 39.6 38.4  MCV 87.8 82.4  PLT 259 229   Basic Metabolic Panel: Recent Labs  Lab 02/02/23 1756 02/03/23 0439 02/04/23 0347 02/05/23 0359 02/06/23 0240  NA 132* 133* 133* 134* 134*  K 4.0 3.1* 5.3* 3.2* 2.7*  CL 95* 93* 98 88* 86*  CO2 20* 22 20* 30 32  GLUCOSE 112* 125* 119* 91 93  BUN 46* 45* 48* 55* 54*  CREATININE 1.64* 1.49* 1.77* 1.74* 1.73*  CALCIUM 9.4 9.5 9.4 9.4 9.3  MG  --  2.5* 2.8* 2.5* 2.5*  PHOS  --  5.0*  --   --   --    GFR: Estimated Creatinine Clearance: 19.4 mL/min (A) (by C-G formula based on SCr of 1.73 mg/dL (H)). Liver Function Tests: No results for input(s): "AST", "ALT", "ALKPHOS", "BILITOT", "PROT", "ALBUMIN" in the last 168 hours. No results for input(s): "LIPASE", "AMYLASE" in the last 168 hours. No results for input(s): "AMMONIA" in the last 168 hours. Coagulation Profile: No results for input(s): "INR", "PROTIME" in the last 168 hours. Cardiac Enzymes: No results for input(s): "CKTOTAL", "CKMB", "CKMBINDEX", "TROPONINI" in the last 168 hours. BNP (last 3 results) Recent Labs    08/20/22 1539 09/08/22 1308 01/18/23 1326  PROBNP 4,330* 5,154* 9,910*   HbA1C: No results for input(s): "HGBA1C" in the last 72 hours. CBG: No  results for input(s): "GLUCAP" in the last 168 hours. Lipid Profile: No results for input(s): "CHOL", "HDL", "LDLCALC", "TRIG", "CHOLHDL", "LDLDIRECT" in the last 72 hours. Thyroid Function Tests: No results for input(s): "TSH", "T4TOTAL", "FREET4", "T3FREE", "THYROIDAB" in the last 72 hours. Anemia Panel: No results for input(s): "VITAMINB12", "FOLATE", "FERRITIN", "TIBC", "IRON", "RETICCTPCT" in the last 72 hours. Sepsis Labs: No results for input(s): "PROCALCITON", "LATICACIDVEN" in the last 168 hours.  No results found for this or any previous visit (from the past 240 hour(s)).       Radiology Studies: No  results found.      Scheduled Meds:  apixaban  5 mg Oral BID   ascorbic acid  500 mg Oral Daily   atorvastatin  40 mg Oral Daily   brinzolamide  1 drop Both Eyes BID   budesonide  0.25 mg Nebulization BID   cyanocobalamin  1,000 mcg Oral Daily   dapagliflozin propanediol  10 mg Oral Daily   fluticasone  2 spray Each Nare Daily   latanoprost  1 drop Both Eyes QHS   levothyroxine  100 mcg Oral Daily   metoprolol succinate  25 mg Oral Daily   Continuous Infusions:   LOS: 3 days    Time spent: 52 minutes spent on chart review, discussion with nursing staff, consultants, updating family and interview/physical exam; more than 50% of that time was spent in counseling and/or coordination of care.    Alvira Philips [place], DO [facility] Available via Epic secure chat 7am-7pm After these hours, please refer to coverage provider listed on [email or web address] 02/06/2023, 10:24 AM

## 2023-02-07 DIAGNOSIS — N179 Acute kidney failure, unspecified: Secondary | ICD-10-CM | POA: Diagnosis not present

## 2023-02-07 DIAGNOSIS — I5021 Acute systolic (congestive) heart failure: Secondary | ICD-10-CM | POA: Diagnosis not present

## 2023-02-07 LAB — BASIC METABOLIC PANEL
Anion gap: 12 (ref 5–15)
BUN: 42 mg/dL — ABNORMAL HIGH (ref 8–23)
CO2: 27 mmol/L (ref 22–32)
Calcium: 8.8 mg/dL — ABNORMAL LOW (ref 8.9–10.3)
Chloride: 93 mmol/L — ABNORMAL LOW (ref 98–111)
Creatinine, Ser: 1.44 mg/dL — ABNORMAL HIGH (ref 0.44–1.00)
GFR, Estimated: 37 mL/min — ABNORMAL LOW (ref >60)
Glucose, Bld: 119 mg/dL — ABNORMAL HIGH (ref 70–99)
Potassium: 3.4 mmol/L — ABNORMAL LOW (ref 3.5–5.1)
Sodium: 132 mmol/L — ABNORMAL LOW (ref 135–145)

## 2023-02-07 LAB — MAGNESIUM: Magnesium: 2.7 mg/dL — ABNORMAL HIGH (ref 1.7–2.4)

## 2023-02-07 MED ORDER — POTASSIUM CHLORIDE CRYS ER 20 MEQ PO TBCR
30.0000 meq | EXTENDED_RELEASE_TABLET | ORAL | Status: AC
  Administered 2023-02-07 (×2): 30 meq via ORAL
  Filled 2023-02-07 (×2): qty 1

## 2023-02-07 MED ORDER — TORSEMIDE 20 MG PO TABS
40.0000 mg | ORAL_TABLET | Freq: Every day | ORAL | Status: DC
  Administered 2023-02-07 – 2023-02-09 (×3): 40 mg via ORAL
  Filled 2023-02-07 (×3): qty 2

## 2023-02-07 NOTE — Plan of Care (Signed)
  Problem: Education: Goal: Knowledge of General Education information will improve Description Including pain rating scale, medication(s)/side effects and non-pharmacologic comfort measures Outcome: Progressing   Problem: Health Behavior/Discharge Planning: Goal: Ability to manage health-related needs will improve Outcome: Progressing   

## 2023-02-07 NOTE — Plan of Care (Signed)
  Problem: Nutrition: Goal: Adequate nutrition will be maintained Outcome: Completed/Met

## 2023-02-07 NOTE — Progress Notes (Signed)
Mobility Specialist Progress Note    02/07/23 1102  Mobility  Activity Ambulated independently in hallway  Level of Assistance Modified independent, requires aide device or extra time  Assistive Device Four wheel walker  Distance Ambulated (ft) 450 ft  Activity Response Tolerated well  Mobility Referral Yes  $Mobility charge 1 Mobility  Mobility Specialist Start Time (ACUTE ONLY) 1053  Mobility Specialist Stop Time (ACUTE ONLY) 1102  Mobility Specialist Time Calculation (min) (ACUTE ONLY) 9 min   Pt received in bed and agreeable. No complaints on walk. Returned to room with husband present.  [place] Mobility Specialist  Please Neurosurgeon or Rehab Office at [phone]

## 2023-02-07 NOTE — Progress Notes (Signed)
PROGRESS NOTE    Nichole Johnson  UEA:[id number] DOB: February 26, 1946 DOA: 02/02/2023 PCP: [name], MD    Brief Narrative:   Nichole Johnson is a 77 y.o. female with past medical history significant for chronic systolic congestive heart failure, nonischemic cardiomyopathy (LVEF 25%), recent NSTEMI, HTN, CKD stage IIIa, coronary thrombus on LHC, HTN, osteoarthritis s/p bilateral knee replacement, recently admitted by the cardiology service from 10/10 - 01/26/2023 who presented to [facility] ED from home with worsening exertional dyspnea.  Reportedly only can ambulate a few feet before getting severely short winded.  Also notable for orthopnea.  Denies chest pain, no significant lower extremity edema, no weight gain.  Reports compliance with her home cardiac medications.  In the ED, Temperature 98.8. BP 98/65, pulse 80, respiratory 18, O2 saturation 100% on room air.  WBC 9.9, hemoglobin 12.5, platelet count 259.  Sodium 132, serum bicarb 20, BUN 46, creatinine 1.64, GFR 32. BNP 3869. Troponin 265, 239.  BNP 3869.9.  Chest x-ray with cardiomegaly with mild central vascular pulmonary congestion, large hiatal hernia.  EKG with NSR, rate 85, QTc 459, no concerning ST elevation/depressions or T wave inversions.  Patient received IV Lasix 80 mg x 1 in the ED.  Cardiology consulted.  TRH consulted for admission for further evaluation and management of acute on chronic systolic congestive heart failure, acute renal failure on CKD stage IIIa.  Assessment & Plan:   Acute on chronic systolic congestive heart failure History of nonischemic cardiomyopathy Patient presenting with progressive shortness of breath.  History of NI CM with LVEF 25%, recent NSTEMI and recently discharged by the cardiology service on 01/26/2023.  Patient reports compliance with her home medications, no significant lower extremity edema and weights have been stable.  Patient endorses orthopnea, dyspnea on exertion.  BNP elevated 3169, chest x-ray with  vascular pulmonary congestion.  Recent TTE 10/12 with LVEF 25-30%, LV hypokinesis, grade 3 diastolic dysfunction, moderate MR. -- Cardiology and advanced heart failure team following, appreciate assistance -- net negative 2.4L past 24h and net negative 6.7L since admission -- wt 122>>114>118lbs -- Metoprolol succinate 25 mg p.o. daily -- s/p metolazone 5mg  PO x 1 on 10/24 -- Start torsemide 40 mg p.o. daily today -- Unable to tolerate further GDMT due to BP -- Strict I's and O's and daily weights -- Monitor BMP daily -- Patient has turned down further heart catheterization/RHC for now, ? TEE outpatient  Acute renal failure on CKD stage IIIa Patient presenting with a creatinine elevated 1.64 with baseline 1.2.  Etiology likely secondary to volume overload, cardiorenal syndrome. -- Cr 1.64>>1.49>1.77>1.74>1.44 -- Avoid nephrotoxins, renally dose all medications -- BMP daily  Hypokalemia Potassium 3.4, replete today -- BMP daily  Coronary thrombus noted on left heart catheterization 01/25/2023 -- Eliquis 5 mg p.o. twice daily  Hypothyroidism -- Levothyroxine 100 mcg p.o. daily  Hyperlipidemia -- Atorvastatin 40 mg p.o. daily  Essential hypertension -- Metoprolol succinate 25 mg p.o. daily  Moderate persistent reactive airway disease Follows with pulmonology at [facility], Helene Kelp, PA.  On Qvar inhaler outpatient. -- Budesonide neb twice daily -- Albuterol neb every 6 hours as needed wheezing/shortness of breath   DVT prophylaxis:  apixaban (ELIQUIS) tablet 5 mg    Code Status: Full Code Family Communication: No family present at bedside this morning, updated husband extensively at bedside yesterday  Disposition Plan:  Level of care: Telemetry Cardiac Status is: Inpatient Remains inpatient appropriate because: Restarting oral diuretic today, replacing potassium.  Hopeful for discharge home in  1-2 days  Consultants:  Cardiology/advanced heart failure team  Procedures:   None  Antimicrobials:  None   Subjective: Patient seen examined bedside, resting calmly.  Sitting at edge of bed.  Husband and RN present.  Discussed with cardiology, Dr. Flora Lipps this morning.  Restarting oral torsemide today.  Continue to replete potassium.  Overall her breathing continues to be markedly improved since initial presentation.  No other specific questions or concerns at this time. Denies headache, no dizziness, no chest pain, no palpitations, no fever/chills/night sweats, no nausea/vomiting/diarrhea, no focal weakness, no fatigue, no paresthesias.  No acute events overnight per nursing staff.  Objective: Vitals:   02/07/23 0240 02/07/23 0500 02/07/23 0755 02/07/23 0834  BP: 105/75  109/84   Pulse: 81  84 86  Resp: 16  20 18   Temp: 98.7 F (37.1 C)  97.8 F (36.6 C)   TempSrc: Oral  Oral   SpO2: 98%  97% 100%  Weight:  53.7 kg    Height:        Intake/Output Summary (Last 24 hours) at 02/07/2023 1015 Last data filed at 02/07/2023 0843 Gross per 24 hour  Intake 340 ml  Output 1000 ml  Net -660 ml   Filed Weights   02/05/23 0416 02/06/23 0522 02/07/23 0500  Weight: 53.4 kg 51.9 kg 53.7 kg    Examination:  Physical Exam: GEN: NAD, alert and oriented x 3, wd/wn HEENT: NCAT, PERRL, EOMI, sclera clear, MMM PULM: CTAB w/o w/r/r, normal respiratory effort without accessory muscle use, on room air  CV: RRR w/o M/G/R GI: abd soft, NTND, + BS MSK: Trace-1+ bilateral lower extremity peripheral edema, muscle strength globally intact 5/5 bilateral upper/lower extremities NEURO: CN II-XII intact, no focal deficits, sensation to light touch intact PSYCH: normal mood/affect Integumentary: dry/intact, no rashes or wounds    Data Reviewed: I have personally reviewed following labs and imaging studies  CBC: Recent Labs  Lab 02/02/23 1756 02/03/23 0439  WBC 9.9 9.9  HGB 12.5 12.8  HCT 39.6 38.4  MCV 87.8 82.4  PLT 259 229   Basic Metabolic Panel: Recent Labs   Lab 02/03/23 0439 02/04/23 0347 02/05/23 0359 02/06/23 0240 02/06/23 1554 02/07/23 0257  NA 133* 133* 134* 134*  --  132*  K 3.1* 5.3* 3.2* 2.7* 3.8 3.4*  CL 93* 98 88* 86*  --  93*  CO2 22 20* 30 32  --  27  GLUCOSE 125* 119* 91 93  --  119*  BUN 45* 48* 55* 54*  --  42*  CREATININE 1.49* 1.77* 1.74* 1.73*  --  1.44*  CALCIUM 9.5 9.4 9.4 9.3  --  8.8*  MG 2.5* 2.8* 2.5* 2.5*  --  2.7*  PHOS 5.0*  --   --   --   --   --    GFR: Estimated Creatinine Clearance: 23.7 mL/min (A) (by C-G formula based on SCr of 1.44 mg/dL (H)). Liver Function Tests: No results for input(s): "AST", "ALT", "ALKPHOS", "BILITOT", "PROT", "ALBUMIN" in the last 168 hours. No results for input(s): "LIPASE", "AMYLASE" in the last 168 hours. No results for input(s): "AMMONIA" in the last 168 hours. Coagulation Profile: No results for input(s): "INR", "PROTIME" in the last 168 hours. Cardiac Enzymes: No results for input(s): "CKTOTAL", "CKMB", "CKMBINDEX", "TROPONINI" in the last 168 hours. BNP (last 3 results) Recent Labs    08/20/22 1539 09/08/22 1308 01/18/23 1326  PROBNP 4,330* 5,154* 9,910*   HbA1C: No results for input(s): "HGBA1C" in the last  72 hours. CBG: No results for input(s): "GLUCAP" in the last 168 hours. Lipid Profile: No results for input(s): "CHOL", "HDL", "LDLCALC", "TRIG", "CHOLHDL", "LDLDIRECT" in the last 72 hours. Thyroid Function Tests: No results for input(s): "TSH", "T4TOTAL", "FREET4", "T3FREE", "THYROIDAB" in the last 72 hours. Anemia Panel: No results for input(s): "VITAMINB12", "FOLATE", "FERRITIN", "TIBC", "IRON", "RETICCTPCT" in the last 72 hours. Sepsis Labs: No results for input(s): "PROCALCITON", "LATICACIDVEN" in the last 168 hours.  No results found for this or any previous visit (from the past 240 hour(s)).       Radiology Studies: No results found.      Scheduled Meds:  apixaban  5 mg Oral BID   ascorbic acid  500 mg Oral Daily   atorvastatin   40 mg Oral Daily   brinzolamide  1 drop Both Eyes BID   budesonide  0.25 mg Nebulization BID   cyanocobalamin  1,000 mcg Oral Daily   dapagliflozin propanediol  10 mg Oral Daily   fluticasone  2 spray Each Nare Daily   latanoprost  1 drop Both Eyes QHS   levothyroxine  100 mcg Oral Daily   metoprolol succinate  25 mg Oral Daily   potassium chloride  30 mEq Oral Q3H   torsemide  40 mg Oral Daily   Continuous Infusions:   LOS: 4 days    Time spent: 52 minutes spent on chart review, discussion with nursing staff, consultants, updating family and interview/physical exam; more than 50% of that time was spent in counseling and/or coordination of care.    Alvira Philips [place], DO [facility] Available via Epic secure chat 7am-7pm After these hours, please refer to coverage provider listed on [email or web address] 02/07/2023, 10:15 AM

## 2023-02-07 NOTE — Progress Notes (Signed)
Cardiology Progress Note  Patient ID: Nichole Johnson MRN: [id number] DOB: Sep 15, 1945 Date of Encounter: 02/07/2023 Primary Cardiologist: Nichole Balsam, MD  Subjective   Chief Complaint: Shortness of breath  HPI: Volume status seems to be improving.  Plan to monitor on oral diuretic today.  ROS:  All other ROS reviewed and negative. Pertinent positives noted in the HPI.     Vital Signs   Vitals:   02/07/23 0240 02/07/23 0500 02/07/23 0755 02/07/23 0834  BP: 105/75  109/84   Pulse: 81  84 86  Resp: 16  20 18   Temp: 98.7 F (37.1 C)  97.8 F (36.6 C)   TempSrc: Oral  Oral   SpO2: 98%  97% 100%  Weight:  53.7 kg    Height:        Intake/Output Summary (Last 24 hours) at 02/07/2023 0857 Last data filed at 02/07/2023 0843 Gross per 24 hour  Intake 440 ml  Output 1150 ml  Net -710 ml      02/07/2023    5:00 AM 02/06/2023    5:22 AM 02/05/2023    4:16 AM  Last 3 Weights  Weight (lbs) 118 lb 6.2 oz 114 lb 8 oz 117 lb 11.6 oz  Weight (kg) 53.7 kg 51.937 kg 53.4 kg      Telemetry  Overnight telemetry shows sinus rhythm 80s, PVCs noted, brief atrial tachycardia, which I personally reviewed.   Physical Exam   Vitals:   02/07/23 0240 02/07/23 0500 02/07/23 0755 02/07/23 0834  BP: 105/75  109/84   Pulse: 81  84 86  Resp: 16  20 18   Temp: 98.7 F (37.1 C)  97.8 F (36.6 C)   TempSrc: Oral  Oral   SpO2: 98%  97% 100%  Weight:  53.7 kg    Height:        Intake/Output Summary (Last 24 hours) at 02/07/2023 0857 Last data filed at 02/07/2023 0843 Gross per 24 hour  Intake 440 ml  Output 1150 ml  Net -710 ml       02/07/2023    5:00 AM 02/06/2023    5:22 AM 02/05/2023    4:16 AM  Last 3 Weights  Weight (lbs) 118 lb 6.2 oz 114 lb 8 oz 117 lb 11.6 oz  Weight (kg) 53.7 kg 51.937 kg 53.4 kg    Body mass index is 23.91 kg/m.  General: Well nourished, well developed, in no acute distress Head: Atraumatic, normal size  Eyes: PEERLA, EOMI  Neck: Supple, no  JVD Endocrine: No thryomegaly Cardiac: Normal S1, S2; RRR; no murmurs, rubs, or gallops Lungs: Clear to auscultation bilaterally, no wheezing, rhonchi or rales  Abd: Soft, nontender, no hepatomegaly  Ext: No edema, pulses 2+ Musculoskeletal: No deformities, BUE and BLE strength normal and equal Skin: Warm and dry, no rashes   Neuro: Alert and oriented to person, place, time, and situation, CNII-XII grossly intact, no focal deficits  Psych: Normal mood and affect   Cardiac Studies  TTE 01/23/2023  1. Left ventricular ejection fraction, by estimation, is 25 to 30%. The  left ventricle has severely decreased function. The left ventricle  demonstrates global hypokinesis. The left ventricular internal cavity size  was dilated. Left ventricular diastolic   parameters are consistent with Grade III diastolic dysfunction  (restrictive). Elevated left atrial pressure.   2. Right ventricular systolic function is low normal. The right  ventricular size is normal. There is normal pulmonary artery systolic  pressure.   3. The mitral valve is  degenerative. Moderate mitral valve regurgitation.  No evidence of mitral stenosis.   4. The aortic valve is tricuspid. Aortic valve regurgitation is not  visualized. No aortic stenosis is present.   5. The inferior vena cava is normal in size with <50% respiratory  variability, suggesting right atrial pressure of 8 mmHg.   Patient Profile  Nichole Johnson is a 77 y.o. 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   Assessment & Plan   # Acute on chronic systolic heart failure, EF 25% # Moderate to severe mitral valve regurgitation # Hypokalemia # CKD stage IIIb # Acute kidney injury -Intolerant of GDMT.  Only on metoprolol succinate 25 mg daily and Jardiance.  Hypotension precludes further medications. -Has been  diuresed well.  Plan is to monitor on oral diuretics today. -Start torsemide 40 mg daily today.  We will see how she does. -Outpatient consideration for MitraClip.  May benefit given recurrent admission.  # Non-STEMI # Thrombotic occlusion of OM 3 -Cardiomyopathy is out of proportion to CAD.  This is actually thought to be a thrombotic event.  Continue Eliquis 5 mg twice daily.  Hold aspirin. -Continue statin.      For questions or updates, please contact [facility] Please consult [email or web address] for contact info under        Signed, Gerri Spore T. Flora Lipps, MD, [facility] [place]  [facility]  02/07/2023 8:57 AM

## 2023-02-08 DIAGNOSIS — N179 Acute kidney failure, unspecified: Secondary | ICD-10-CM | POA: Diagnosis not present

## 2023-02-08 LAB — BASIC METABOLIC PANEL
Anion gap: 14 (ref 5–15)
Anion gap: 17 — ABNORMAL HIGH (ref 5–15)
BUN: 47 mg/dL — ABNORMAL HIGH (ref 8–23)
BUN: 49 mg/dL — ABNORMAL HIGH (ref 8–23)
CO2: 28 mmol/L (ref 22–32)
CO2: 25 mmol/L (ref 22–32)
Calcium: 9.1 mg/dL (ref 8.9–10.3)
Calcium: 9.3 mg/dL (ref 8.9–10.3)
Chloride: 91 mmol/L — ABNORMAL LOW (ref 98–111)
Chloride: 93 mmol/L — ABNORMAL LOW (ref 98–111)
Creatinine, Ser: 1.4 mg/dL — ABNORMAL HIGH (ref 0.44–1.00)
Creatinine, Ser: 1.57 mg/dL — ABNORMAL HIGH (ref 0.44–1.00)
GFR, Estimated: 38 mL/min — ABNORMAL LOW (ref >60)
GFR, Estimated: 33 mL/min — ABNORMAL LOW (ref >60)
Glucose, Bld: 103 mg/dL — ABNORMAL HIGH (ref 70–99)
Glucose, Bld: 138 mg/dL — ABNORMAL HIGH (ref 70–99)
Potassium: 3 mmol/L — ABNORMAL LOW (ref 3.5–5.1)
Potassium: 4.2 mmol/L (ref 3.5–5.1)
Sodium: 133 mmol/L — ABNORMAL LOW (ref 135–145)
Sodium: 135 mmol/L (ref 135–145)

## 2023-02-08 LAB — MAGNESIUM: Magnesium: 2.6 mg/dL — ABNORMAL HIGH (ref 1.7–2.4)

## 2023-02-08 MED ORDER — POTASSIUM CHLORIDE CRYS ER 20 MEQ PO TBCR: 40.0000 meq | EXTENDED_RELEASE_TABLET | ORAL | Status: DC

## 2023-02-08 MED ORDER — POTASSIUM CHLORIDE CRYS ER 20 MEQ PO TBCR: 30.0000 meq | EXTENDED_RELEASE_TABLET | ORAL | Status: DC

## 2023-02-08 MED ORDER — POTASSIUM CHLORIDE CRYS ER 20 MEQ PO TBCR
30.0000 meq | EXTENDED_RELEASE_TABLET | ORAL | Status: AC
Start: 2023-02-08 — End: 2023-02-08
  Administered 2023-02-08 (×3): 30 meq via ORAL
  Filled 2023-02-08 (×3): qty 1

## 2023-02-08 MED ORDER — TORSEMIDE 20 MG PO TABS
20.0000 mg | ORAL_TABLET | Freq: Every day | ORAL | Status: DC
  Filled 2023-02-08: qty 1

## 2023-02-08 NOTE — Progress Notes (Signed)
Physical Therapy Treatment Patient Details Name: Nichole Johnson MRN: [id number] DOB: Sep 22, 1945 Today's Date: 02/08/2023   History of Present Illness Pt is a 77 y/o female admitted 10/22 to the ED from home due to worsening exertional dyspnea.  Work up includes Acute on Chronic systolic CHF.  PMHx:  including but not limited to  sCHF, AKI, Breast CA, cardiomyopathy, HTN, bil TKA.    PT Comments  Pt is ready for d/c from mobility standpoint.  Emphasis on progression of activity after d/c home and d/c from HHPT.  Resuming activity at her gyms with okay from MD.  Worked on gait stability, speed, stamina and safe negotiation of stairs.     If plan is discharge home, recommend the following:  (PRN assist)   Can travel by private vehicle        Equipment Recommendations  None recommended by PT    Recommendations for Other Services       Precautions / Restrictions Precautions Precautions:  (low fall risk)     Mobility  Bed Mobility Overal bed mobility: Modified Independent                  Transfers Overall transfer level: Needs assistance Equipment used: Rollator (4 wheels) Transfers: Sit to/from Stand Sit to Stand: Supervision           General transfer comment: safety is appropriate    Ambulation/Gait Ambulation/Gait assistance: Supervision Gait Distance (Feet): 350 Feet Assistive device: Rollator (4 wheels) Gait Pattern/deviations: Step-through pattern   Gait velocity interpretation: 1.31 - 2.62 ft/sec, indicative of limited community ambulator   General Gait Details: safe, steady gait with good control of the rolator.  Age appropriate gait speed.  No deviation with scanning and directional changes.   Stairs Stairs: Yes Stairs assistance: Supervision Stair Management: One rail Left, Alternating pattern, Step to pattern, Forwards Number of Stairs: 10 General stair comments: safe with the rails   Wheelchair Mobility     Tilt Bed    Modified  Rankin (Stroke Patients Only)       Balance     Sitting balance-Leahy Scale: Good       Standing balance-Leahy Scale: Good                              Cognition Arousal: Alert Behavior During Therapy: WFL for tasks assessed/performed Overall Cognitive Status: Within Functional Limits for tasks assessed                                          Exercises      General Comments General comments (skin integrity, edema, etc.): vss on RA      Pertinent Vitals/Pain Pain Assessment Pain Assessment: No/denies pain Pain Intervention(s): Monitored during session    Home Living                          Prior Function            PT Goals (current goals can now be found in the care plan section) Acute Rehab PT Goals PT Goal Formulation: With patient Time For Goal Achievement: 02/18/23 Potential to Achieve Goals: Good Progress towards PT goals: Progressing toward goals    Frequency    Min 1X/week      PT Plan  Co-evaluation              AM-PAC PT "6 Clicks" Mobility   Outcome Measure  Help needed turning from your back to your side while in a flat bed without using bedrails?: None Help needed moving from lying on your back to sitting on the side of a flat bed without using bedrails?: None Help needed moving to and from a bed to a chair (including a wheelchair)?: A Little Help needed standing up from a chair using your arms (e.g., wheelchair or bedside chair)?: A Little Help needed to walk in hospital room?: A Little Help needed climbing 3-5 steps with a railing? : A Little 6 Click Score: 20    End of Session   Activity Tolerance: Patient tolerated treatment well Patient left: in bed;with call bell/phone within reach;with family/visitor present Nurse Communication: Mobility status PT Visit Diagnosis: Difficulty in walking, not elsewhere classified (R26.2)     Time: 8416-6063 PT Time Calculation (min)  (ACUTE ONLY): 25 min  Charges:    $Gait Training: 8-22 mins $Therapeutic Activity: 8-22 mins PT General Charges $$ ACUTE PT VISIT: 1 Visit                     02/08/2023  Jacinto Halim., PT Acute Rehabilitation Services [phone]  (office)   [name] 02/08/2023, 3:28 PM

## 2023-02-08 NOTE — Progress Notes (Addendum)
Advanced Heart Failure Rounding Note  PCP-Cardiologist: Gypsy Balsam, MD   Subjective:    Hypokalemia over the weekend. K 3.0, replacing this morning.  Weight stable. I/Os presumed inaccurate.   No CP, no SOB. Ambulating without difficulty.  Objective:    Weight Range: 53.6 kg Body mass index is 23.87 kg/m.   Vital Signs:   Temp:  [97.6 F (36.4 C)-97.8 F (36.6 C)] 97.8 F (36.6 C) (10/28 0315) Pulse Rate:  [83-89] 83 (10/28 0315) Resp:  [17-21] 17 (10/28 0315) BP: (99-109)/(68-84) 102/68 (10/28 0315) SpO2:  [97 %-100 %] 100 % (10/28 0315) Weight:  [53.6 kg] 53.6 kg (10/28 0500) Last BM Date : 02/07/23  Weight change: Filed Weights   02/06/23 0522 02/07/23 0500 02/08/23 0500  Weight: 51.9 kg 53.7 kg 53.6 kg    Intake/Output:   Intake/Output Summary (Last 24 hours) at 02/08/2023 0720 Last data filed at 02/08/2023 0048 Gross per 24 hour  Intake 320 ml  Output 651 ml  Net -331 ml      Physical Exam    General:  Well appearing. No resp difficulty HEENT: Normal Neck: Supple. JVP 8cm . Carotids 2+ bilat; no bruits. No lymphadenopathy or thyromegaly appreciated. Cor: PMI nondisplaced. No rubs, gallops or murmurs. Lungs: fine crackles in the bases Abdomen: Soft, nontender, nondistended. No hepatosplenomegaly. No bruits or masses. Good bowel sounds. Extremities: No cyanosis, clubbing, rash, no edema Neuro: Alert & orientedx3, cranial nerves grossly intact. moves all 4 extremities w/o difficulty. Affect pleasant  Telemetry   SR in 80s w PVCs  EKG    N/A  Patient Profile   Nichole Johnson is a 77 y.o. 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 Admitted for CHF exacerbation.  Assessment/Plan   1. Acute on chronic HFrEF/NICM with moderate MR - Etiology:  suspect related to adriamycin toxicity by cMRI shows possible diagonal infarct with no ischemia on adenosine stress. 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 Focal thrombotic stenosis noted in the large OM 3 branch of C without significant CAD. 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 - GDMT limited by hypotension and renal function - Continue Farxiga 10 mg daily (started 10/16) - Continue low dose Metoprolol succ 25mg  daily - Increase oral torsemide to 40mg  am and 20mg  pm + potassium supplementation - MR may be moderate to severe, and given the intermittent nature of her symptoms could consider further evaluation with TEE if not improving.   2. NSTEMI/CAD - Trop 161>096 - LHC 10/24: 80% thrombotic OM3 lesion otherwise nonobstructive disease to be managed medically - Continue Eliquis 5 bid - Continue Lipitor 40   3. AKI on CKDIIIb - sCr at baseline ~1.1-1.2. Improving, now 1.4. - Diuresis as above - Follow BMET  4. PVCs - Continue low dose metoprolol   - Goal K>4; Mg>2 - Replete K  5. Reactive Lung Disease/OSA - followed by [facility] pulm, PFTs and nighttime oximetry ordered as outpatient - + orthopnea - nebs per primary   6. Hypothyroid  - Synthroid  7. Hypokalemia - K 3.0, replacing - Will need home supplementation at discharge.  Heart failure team will sign  off as of 02/08/23  HF Team Medication Recommendations for Home: Atorvastatin 40 mg daily Eliquis 5mg  BID Farxiga 10 mg daily Metoprolol succinate 25mg  daily Torsemide 40mg  am + 20mg  pm, can take an additional 20mg  tablet with the morning dose for weight gain or shortness of breath Potassium BID   Follow up as an outpatient: [facility] 11/11 at 2:20p with  Dr. Dorrene German of Stay: 5  [place] Drue Harr, NP  02/08/2023, 7:20 AM  Advanced Heart Failure Team Pager [phone] (M-F; 7a - 5p)  Please contact [facility] for night-coverage after hours (5p -7a ) and weekends on [email or web address]

## 2023-02-08 NOTE — Progress Notes (Signed)
Mobility Specialist Progress Note:    02/08/23 1033  Mobility  Activity Ambulated independently in hallway  Level of Assistance Modified independent, requires aide device or extra time  Assistive Device Four wheel walker  Distance Ambulated (ft) 500 ft  Activity Response Tolerated well  Mobility Referral Yes  $Mobility charge 1 Mobility  Mobility Specialist Start Time (ACUTE ONLY) 0930  Mobility Specialist Stop Time (ACUTE ONLY) 0943  Mobility Specialist Time Calculation (min) (ACUTE ONLY) 13 min   Received pt in bed having no complaints and agreeable to mobility. Pt was asymptomatic throughout ambulation and returned to room w/o fault. Left sitting EOB w/ call bell in reach and all needs met.   Thompson Grayer Mobility Specialist  Please contact vis Secure Chat or  Rehab Office [phone]'

## 2023-02-08 NOTE — Progress Notes (Signed)
PROGRESS NOTE    Nichole Johnson  ZOX:[id number] DOB: [birth year] DOA: 02/02/2023 PCP: [name], MD    Brief Narrative:   Nichole Johnson is a 77 y.o. 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  Reportedly only can ambulate a few feet before getting severely short winded.  Also notable for orthopnea.  Denies chest pain, no significant lower extremity edema, no weight gain.  Reports compliance with her home cardiac medications.  In the ED, Temperature 98.8. BP 98/65, pulse 80, respiratory 18, O2 saturation 100% on room air.  WBC 9.9, hemoglobin 12.5, platelet count 259.  Sodium 132, serum bicarb 20, BUN 46, creatinine 1.64, GFR 32. BNP 3869. Troponin 265, 239.  BNP 3869.9.  Chest x-ray with cardiomegaly with mild central vascular pulmonary congestion, large hiatal hernia.  EKG with NSR, rate 85, QTc 459, no concerning ST elevation/depressions or T wave inversions.  Patient received IV Lasix 80 mg x 1 in the ED.  Cardiology consulted.  TRH consulted for admission for further evaluation and management of acute on chronic systolic congestive heart failure, acute renal failure on CKD stage IIIa.  Assessment & Plan:   Acute on chronic systolic congestive heart failure History of nonischemic cardiomyopathy Patient presenting with progressive shortness of breath.  History of NI CM with LVEF 25%, recent NSTEMI and recently discharged by the cardiology service on 01/26/2023.  Patient reports compliance with her home medications, no significant lower extremity edema and weights have been stable.  Patient endorses orthopnea, dyspnea on exertion.  BNP elevated 3169, chest x-ray with  vascular pulmonary congestion.  Recent TTE 10/12 with LVEF 25-30%, LV hypokinesis, grade 3 diastolic dysfunction, moderate MR. -- Cardiology and advanced heart failure team following, appreciate assistance -- net negative 331 mL past 24h w/ one unmeasured occurrence; net negative 7.9L since admission -- wt 122>>114>118lbs -- Metoprolol succinate 25 mg p.o. daily -- s/p metolazone 5mg  PO x 1 on 10/24 -- Torsemide 40 mg p.o. qAm and 20mg  PO qPM -- Unable to tolerate further GDMT due to low BP -- Strict I's and O's and daily weights -- Monitor BMP daily -- Patient has turned down further heart catheterization/RHC for now, ? TEE outpatient -- Has outpatient follow-up appointment with heart failure service, Dr. Shirlee Latch on 02/22/2023  Acute renal failure on CKD stage IIIa Patient presenting with a creatinine elevated 1.64 with baseline 1.2.  Etiology likely secondary to volume overload, cardiorenal syndrome. -- Cr 1.64>>1.49>1.77>1.74>1.44>1.40 -- Avoid nephrotoxins, renally dose all medications -- BMP daily  Hypokalemia Potassium 3.0; Will replete today. -- BMP daily  Coronary thrombus noted on left heart catheterization 01/25/2023 -- Eliquis 5 mg p.o. twice daily  Hypothyroidism -- Levothyroxine 100 mcg p.o. daily  Hyperlipidemia -- Atorvastatin 40 mg p.o. daily  Essential hypertension -- Metoprolol succinate 25 mg p.o. daily  Moderate persistent reactive airway disease Follows with pulmonology at [facility], Helene Kelp, PA.  On Qvar inhaler outpatient. -- Budesonide neb twice daily -- Albuterol neb every 6 hours as needed wheezing/shortness of breath   DVT prophylaxis:  apixaban (ELIQUIS) tablet 5 mg    Code Status: Full Code Family Communication: Updated husband present at bedside this morning.  Disposition Plan:  Level of care: Telemetry Cardiac Status is: Inpatient Remains  inpatient appropriate because: Uptitrating oral diuretic, replacing potassium, hopeful for discharge  home tomorrow  Consultants:  Cardiology/advanced heart failure team  Procedures:  None  Antimicrobials:  None   Subjective: Patient seen examined bedside, resting calmly.  Sitting at edge of bed.  Heart failure NP present at bedside.  Increasing torsemide to 40 mg in the morning and 20 mg in the evening.  Husband concerned about discharge today given continued need to uptitrate, also concerned about low sodium level of 133, and potassium replacement. Overall her breathing continues to be markedly improved since initial presentation.  No other specific questions or concerns at this time. Denies headache, no dizziness, no chest pain, no palpitations, no fever/chills/night sweats, no nausea/vomiting/diarrhea, no focal weakness, no fatigue, no paresthesias.  No acute events overnight per nursing staff.  Objective: Vitals:   02/08/23 0042 02/08/23 0315 02/08/23 0500 02/08/23 0911  BP: 100/72 102/68    Pulse: 84 83    Resp: 20 17    Temp: 97.6 F (36.4 C) 97.8 F (36.6 C)    TempSrc: Oral Oral    SpO2: 100% 100%  94%  Weight:   53.6 kg   Height:        Intake/Output Summary (Last 24 hours) at 02/08/2023 1032 Last data filed at 02/08/2023 0048 Gross per 24 hour  Intake 100 ml  Output 650 ml  Net -550 ml   Filed Weights   02/06/23 0522 02/07/23 0500 02/08/23 0500  Weight: 51.9 kg 53.7 kg 53.6 kg    Examination:  Physical Exam: GEN: NAD, alert and oriented x 3, wd/wn HEENT: NCAT, PERRL, EOMI, sclera clear, MMM PULM: CTAB w/o w/r/r, normal respiratory effort without accessory muscle use, on room air  CV: RRR w/o M/G/R GI: abd soft, NTND, + BS MSK: Trace-1+ bilateral lower extremity peripheral edema, muscle strength globally intact 5/5 bilateral upper/lower extremities NEURO: CN II-XII intact, no focal deficits, sensation to light touch intact PSYCH: normal mood/affect Integumentary: dry/intact, no rashes or wounds    Data Reviewed: I have personally reviewed following  labs and imaging studies  CBC: Recent Labs  Lab 02/02/23 1756 02/03/23 0439  WBC 9.9 9.9  HGB 12.5 12.8  HCT 39.6 38.4  MCV 87.8 82.4  PLT 259 229   Basic Metabolic Panel: Recent Labs  Lab 02/03/23 0439 02/04/23 0347 02/05/23 0359 02/06/23 0240 02/06/23 1554 02/07/23 0257 02/08/23 0325  NA 133* 133* 134* 134*  --  132* 133*  K 3.1* 5.3* 3.2* 2.7* 3.8 3.4* 3.0*  CL 93* 98 88* 86*  --  93* 91*  CO2 22 20* 30 32  --  27 28  GLUCOSE 125* 119* 91 93  --  119* 103*  BUN 45* 48* 55* 54*  --  42* 47*  CREATININE 1.49* 1.77* 1.74* 1.73*  --  1.44* 1.40*  CALCIUM 9.5 9.4 9.4 9.3  --  8.8* 9.1  MG 2.5* 2.8* 2.5* 2.5*  --  2.7* 2.6*  PHOS 5.0*  --   --   --   --   --   --    GFR: Estimated Creatinine Clearance: 24.4 mL/min (A) (by C-G formula based on SCr of 1.4 mg/dL (H)). Liver Function Tests: No results for input(s): "AST", "ALT", "ALKPHOS", "BILITOT", "PROT", "ALBUMIN" in the last 168 hours. No results for input(s): "LIPASE", "AMYLASE" in the last 168 hours. No results for input(s): "AMMONIA" in the last 168 hours. Coagulation Profile: No results for input(s): "INR", "PROTIME" in the last 168 hours. Cardiac  Enzymes: No results for input(s): "CKTOTAL", "CKMB", "CKMBINDEX", "TROPONINI" in the last 168 hours. BNP (last 3 results) Recent Labs    08/20/22 1539 09/08/22 1308 01/18/23 1326  PROBNP 4,330* 5,154* 9,910*   HbA1C: No results for input(s): "HGBA1C" in the last 72 hours. CBG: No results for input(s): "GLUCAP" in the last 168 hours. Lipid Profile: No results for input(s): "CHOL", "HDL", "LDLCALC", "TRIG", "CHOLHDL", "LDLDIRECT" in the last 72 hours. Thyroid Function Tests: No results for input(s): "TSH", "T4TOTAL", "FREET4", "T3FREE", "THYROIDAB" in the last 72 hours. Anemia Panel: No results for input(s): "VITAMINB12", "FOLATE", "FERRITIN", "TIBC", "IRON", "RETICCTPCT" in the last 72 hours. Sepsis Labs: No results for input(s): "PROCALCITON", "LATICACIDVEN"  in the last 168 hours.  No results found for this or any previous visit (from the past 240 hour(s)).       Radiology Studies: No results found.      Scheduled Meds:  apixaban  5 mg Oral BID   ascorbic acid  500 mg Oral Daily   atorvastatin  40 mg Oral Daily   brinzolamide  1 drop Both Eyes BID   budesonide  0.25 mg Nebulization BID   cyanocobalamin  1,000 mcg Oral Daily   dapagliflozin propanediol  10 mg Oral Daily   fluticasone  2 spray Each Nare Daily   latanoprost  1 drop Both Eyes QHS   levothyroxine  100 mcg Oral Daily   metoprolol succinate  25 mg Oral Daily   potassium chloride  30 mEq Oral Q3H   torsemide  20 mg Oral Daily   torsemide  40 mg Oral Daily   Continuous Infusions:   LOS: 5 days    Time spent: 52 minutes spent on chart review, discussion with nursing staff, consultants, updating family and interview/physical exam; more than 50% of that time was spent in counseling and/or coordination of care.    Alvira Philips [place], DO [facility] Available via Epic secure chat 7am-7pm After these hours, please refer to coverage provider listed on [email or web address] 02/08/2023, 10:32 AM

## 2023-02-08 NOTE — Care Management Important Message (Signed)
Important Message  Patient Details  Name: Nichole Johnson MRN: [id number] Date of Birth: [birth year]   Important Message Given:  Yes - Medicare IM     Dorena Bodo 02/08/2023, 3:26 PM

## 2023-02-08 NOTE — Plan of Care (Signed)
  Problem: Education: Goal: Knowledge of General Education information will improve Description Including pain rating scale, medication(s)/side effects and non-pharmacologic comfort measures Outcome: Progressing   Problem: Health Behavior/Discharge Planning: Goal: Ability to manage health-related needs will improve Outcome: Progressing   

## 2023-02-09 DIAGNOSIS — N179 Acute kidney failure, unspecified: Secondary | ICD-10-CM | POA: Diagnosis not present

## 2023-02-09 LAB — BASIC METABOLIC PANEL
Anion gap: 12 (ref 5–15)
BUN: 41 mg/dL — ABNORMAL HIGH (ref 8–23)
CO2: 26 mmol/L (ref 22–32)
Calcium: 9 mg/dL (ref 8.9–10.3)
Chloride: 97 mmol/L — ABNORMAL LOW (ref 98–111)
Creatinine, Ser: 1.34 mg/dL — ABNORMAL HIGH (ref 0.44–1.00)
GFR, Estimated: 40 mL/min — ABNORMAL LOW (ref >60)
Glucose, Bld: 104 mg/dL — ABNORMAL HIGH (ref 70–99)
Potassium: 4.1 mmol/L (ref 3.5–5.1)
Sodium: 135 mmol/L (ref 135–145)

## 2023-02-09 LAB — MAGNESIUM: Magnesium: 2.6 mg/dL — ABNORMAL HIGH (ref 1.7–2.4)

## 2023-02-09 MED ORDER — TORSEMIDE 20 MG PO TABS: ORAL_TABLET | ORAL | 0 refills | Status: DC

## 2023-02-09 MED ORDER — POTASSIUM CHLORIDE CRYS ER 20 MEQ PO TBCR: 40.0000 meq | EXTENDED_RELEASE_TABLET | Freq: Two times a day (BID) | ORAL | 0 refills | Status: DC

## 2023-02-09 MED ORDER — POTASSIUM CHLORIDE CRYS ER 20 MEQ PO TBCR
40.0000 meq | EXTENDED_RELEASE_TABLET | Freq: Once | ORAL | Status: AC
  Administered 2023-02-09: 40 meq via ORAL
  Filled 2023-02-09: qty 2

## 2023-02-09 MED ORDER — TRAVOPROST (BAK FREE) 0.004 % OP SOLN: 1.0000 [drp] | Freq: Every day | OPHTHALMIC | Status: DC

## 2023-02-09 MED ORDER — ATORVASTATIN CALCIUM 40 MG PO TABS: 40.0000 mg | ORAL_TABLET | Freq: Every day | ORAL | Status: AC

## 2023-02-09 MED ORDER — TORSEMIDE 20 MG PO TABS: 20.0000 mg | ORAL_TABLET | Freq: Every day | ORAL | Status: DC | PRN

## 2023-02-09 MED ORDER — APIXABAN 5 MG PO TABS: 5.0000 mg | ORAL_TABLET | Freq: Two times a day (BID) | ORAL | 0 refills | Status: DC

## 2023-02-09 NOTE — Progress Notes (Signed)
Mobility Specialist Progress Note:    02/09/23 0946  Mobility  Activity Ambulated independently in hallway  Level of Assistance Modified independent, requires aide device or extra time  Assistive Device Four wheel walker  Distance Ambulated (ft) 200 ft  Activity Response Tolerated well  Mobility Referral Yes  $Mobility charge 1 Mobility  Mobility Specialist Start Time (ACUTE ONLY) [id number]  Mobility Specialist Stop Time (ACUTE ONLY) 0933  Mobility Specialist Time Calculation (min) (ACUTE ONLY) 10 min   Received pt in bed having no complaints and agreeable to mobility. Pt was asymptomatic throughout ambulation and returned to room w/o fault. Left in chair w/ call bell in reach and all needs met.   Thompson Grayer Mobility Specialist  Please contact vis Secure Chat or  Rehab Office [phone]

## 2023-02-09 NOTE — Plan of Care (Signed)
  Problem: Education: Goal: Knowledge of General Education information will improve Description: Including pain rating scale, medication(s)/side effects and non-pharmacologic comfort measures Outcome: Progressing   Problem: Health Behavior/Discharge Planning: Goal: Ability to manage health-related needs will improve Outcome: Progressing   Problem: Clinical Measurements: Goal: Ability to maintain clinical measurements within normal limits will improve Outcome: Progressing Goal: Will remain free from infection Outcome: Progressing Goal: Diagnostic test results will improve Outcome: Progressing Goal: Respiratory complications will improve Outcome: Progressing Goal: Cardiovascular complication will be avoided Outcome: Progressing   Problem: Activity: Goal: Risk for activity intolerance will decrease Outcome: Progressing   Problem: Coping: Goal: Level of anxiety will decrease Outcome: Progressing   Problem: Safety: Goal: Ability to remain free from injury will improve Outcome: Progressing

## 2023-02-09 NOTE — Discharge Summary (Signed)
Physician Discharge Summary  Nichole Johnson YNW:[id number] DOB: 12-15-1945 DOA: 02/02/2023  PCP: [name], MD  Admit date: 02/02/2023 Discharge date: 02/09/2023  Admitted From: Home Disposition: Home  Recommendations for Outpatient Follow-up:  Follow up with PCP in 1-2 weeks Follow-up with cardiology, Dr. Shirlee Latch as scheduled on 02/22/2023 Torsemide increased to 40 mg p.o. every morning, 20 mg p.o. every afternoon May use additional 20 mg torsemide in the a.m. for total dose 60 mg for shortness of breath/weight gain Started on potassium supplementation, 40 mEq POP BID for hypokalemia Please obtain BMP in one week to assess potassium level and creatinine  Home Health: No needs identified by physical therapy Equipment/Devices: None  Discharge Condition: Stable CODE STATUS: Full code Diet recommendation: Heart healthy diet, 1500 mL fluid restriction  History of present illness:  Nichole Johnson is a 77 y.o. 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  Reportedly only can ambulate a few feet before getting severely short winded.  Also notable for orthopnea.  Denies chest pain, no significant lower extremity edema, no weight gain.  Reports compliance with her home cardiac medications.   In the ED, Temperature 98.8. BP 98/65, pulse 80, respiratory 18, O2 saturation 100% on room air.  WBC 9.9, hemoglobin 12.5, platelet count 259.  Sodium 132, serum bicarb 20, BUN 46, creatinine 1.64, GFR 32. BNP 3869. Troponin 265, 239.  BNP 3869.9.  Chest x-ray with cardiomegaly with mild central vascular pulmonary congestion, large hiatal hernia.  EKG with NSR, rate 85, QTc 459, no  concerning ST elevation/depressions or T wave inversions.  Patient received IV Lasix 80 mg x 1 in the ED.  Cardiology consulted.  TRH consulted for admission for further evaluation and management of acute on chronic systolic congestive heart failure, acute renal failure on CKD stage IIIa.  Hospital course:  Acute on chronic systolic congestive heart failure History of nonischemic cardiomyopathy Patient presenting with progressive shortness of breath.  History of NI CM with LVEF 25%, recent NSTEMI and recently discharged by the cardiology service on 01/26/2023.  Patient reports compliance with her home medications, no significant lower extremity edema and weights have been stable.  Patient endorses orthopnea, dyspnea on exertion.  BNP elevated 3169, chest x-ray with vascular pulmonary congestion.  Recent TTE 10/12 with LVEF 25-30%, LV hypokinesis, grade 3 diastolic dysfunction, moderate MR. Cardiology was consulted and followed during hospital course.  Patient was initially started on IV furosemide and received 1 dose of metolazone with good diuresis.  Patient will discharge on torsemide 40 mg p.o. every morning and 20 mg p.o. every afternoon.  Unable to tolerate further GDMT due to hypotension.  Patient was recommended for right heart catheterization and TEE but has declined at this point, recommend follow-up outpatient. Has outpatient follow-up appointment with heart failure service, Dr. Shirlee Latch on 02/22/2023.  Recommend BMP in 1 week.   Acute renal failure on CKD stage IIIa Patient presenting with a creatinine elevated 1.64 with baseline 1.2.  Etiology likely secondary to volume overload, cardiorenal syndrome.  Patient was aggressively diuresed with improvement of her creatinine to 1.34 at time of discharge.  Recommend repeat BMP 1 week.   Hypokalemia Repleted during hospitalization.  Discharging on potassium 40 mEq twice daily.  BMP 1  Laurey Morale, MD. Nyra Capes on 02/22/2023.   Specialty: Cardiology Contact information: [address] [place] [address] [phone]                Allergies  Allergen Reactions   Penicillins Itching and Other (See Comments)    Vulvovaginal Candidiasis with associated pruritis  Vaginal Infections  Vaginal Infections    Vulvovaginal Candidiasis with associated pruritis    Vulvovaginal Candidiasis with associated pruritis Vaginal Infections Vaginal Infections   Sulfamethoxazole-Trimethoprim Other (See Comments)    Makes her shaky and sweat   Tramadol Other (See Comments)    Makes her shaky and sweat    Consultations: Cardiology, advanced heart failure team   Procedures/Studies: DG Chest 1 View  Result Date: 02/02/2023 CLINICAL DATA:  Shortness of breath EXAM: CHEST  1 VIEW COMPARISON:  Chest x-ray 01/21/2023 FINDINGS: The heart is enlarged. There is a large hiatal hernia with air-fluid level. There is mild central pulmonary vascular congestion. There is no focal lung consolidation, pleural effusion or pneumothorax. No acute fractures are seen. IMPRESSION: 1. Cardiomegaly with mild central pulmonary vascular congestion. 2. Large hiatal hernia. Electronically Signed   By: Darliss Cheney M.D.   On: 02/02/2023 20:45   CARDIAC CATHETERIZATION  Result Date: 01/25/2023 Images from the original result were not included. Right & Left Heart Catheterization 01/25/23: Hemodynamic data: LV 100/2, EDP 9 mmHg.  Ao 148/66, mean 95 mmHg.  No pressure gradient across the aortic valve. RA:  3/2, mean 2 mmHg. RV 37/2, EDP 5 mmHg. PA 36/15, mean 22 mmHg.  PA saturation 64%. PW 12/12, mean 10 mmHg.  Aortic saturation 94%. QP/QS 1.00. CO 4.3, CI 2.86 by Fick. Angiographic data: LM: Large-caliber vessel.  It smooth and normal. LAD: Large-caliber vessel, gives origin to a moderate-sized D1 and several small diagonals.  It is small and normal. LCx: Large-caliber vessel giving origin to a very tiny OM1 and OM 2 and large OM 3.  Moderate sized AV groove circumflex.  The OM 3 has a focal 80% thrombotic lesion that appears to be organized.  There is TIMI-3 flow distally. RCA: Moderate caliber vessel, dominant, smooth and normal. Impression and recommendations: PA pressure upper limit of normal with low LVEDP, fluoroscopy reveals significant pulmonary parenchymal markings, will probably need follow-up. A focal thrombotic stenosis noted in the large OM 3 branch of CX. Will discontinue aspirin as patient does not have any significant coronary artery disease, focal thrombus appears to be probably related to recent COVID infection.  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  I have started the patient on Eliquis 5 mg p.o. twice daily, can be used for at least 3 months.  10 to 13 mL contrast utilized.   ECHOCARDIOGRAM COMPLETE  Result Date: 01/23/2023    ECHOCARDIOGRAM REPORT   Patient Name:   Nichole Johnson Date of Exam: 01/23/2023 Medical Rec #:  [id number]    Height:       59.0 in Accession #:    [id number]   Weight:       124.1 lb Date of Birth:  Oct 01, 1945    BSA:          1.506 m Patient Age:    77 years     BP:           85/64 mmHg Patient Gender: F            HR:  Physician Discharge Summary  Nichole Johnson YNW:[id number] DOB: 12-15-1945 DOA: 02/02/2023  PCP: [name], MD  Admit date: 02/02/2023 Discharge date: 02/09/2023  Admitted From: Home Disposition: Home  Recommendations for Outpatient Follow-up:  Follow up with PCP in 1-2 weeks Follow-up with cardiology, Dr. Shirlee Latch as scheduled on 02/22/2023 Torsemide increased to 40 mg p.o. every morning, 20 mg p.o. every afternoon May use additional 20 mg torsemide in the a.m. for total dose 60 mg for shortness of breath/weight gain Started on potassium supplementation, 40 mEq POP BID for hypokalemia Please obtain BMP in one week to assess potassium level and creatinine  Home Health: No needs identified by physical therapy Equipment/Devices: None  Discharge Condition: Stable CODE STATUS: Full code Diet recommendation: Heart healthy diet, 1500 mL fluid restriction  History of present illness:  Nichole Johnson is a 77 y.o. 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  Reportedly only can ambulate a few feet before getting severely short winded.  Also notable for orthopnea.  Denies chest pain, no significant lower extremity edema, no weight gain.  Reports compliance with her home cardiac medications.   In the ED, Temperature 98.8. BP 98/65, pulse 80, respiratory 18, O2 saturation 100% on room air.  WBC 9.9, hemoglobin 12.5, platelet count 259.  Sodium 132, serum bicarb 20, BUN 46, creatinine 1.64, GFR 32. BNP 3869. Troponin 265, 239.  BNP 3869.9.  Chest x-ray with cardiomegaly with mild central vascular pulmonary congestion, large hiatal hernia.  EKG with NSR, rate 85, QTc 459, no  concerning ST elevation/depressions or T wave inversions.  Patient received IV Lasix 80 mg x 1 in the ED.  Cardiology consulted.  TRH consulted for admission for further evaluation and management of acute on chronic systolic congestive heart failure, acute renal failure on CKD stage IIIa.  Hospital course:  Acute on chronic systolic congestive heart failure History of nonischemic cardiomyopathy Patient presenting with progressive shortness of breath.  History of NI CM with LVEF 25%, recent NSTEMI and recently discharged by the cardiology service on 01/26/2023.  Patient reports compliance with her home medications, no significant lower extremity edema and weights have been stable.  Patient endorses orthopnea, dyspnea on exertion.  BNP elevated 3169, chest x-ray with vascular pulmonary congestion.  Recent TTE 10/12 with LVEF 25-30%, LV hypokinesis, grade 3 diastolic dysfunction, moderate MR. Cardiology was consulted and followed during hospital course.  Patient was initially started on IV furosemide and received 1 dose of metolazone with good diuresis.  Patient will discharge on torsemide 40 mg p.o. every morning and 20 mg p.o. every afternoon.  Unable to tolerate further GDMT due to hypotension.  Patient was recommended for right heart catheterization and TEE but has declined at this point, recommend follow-up outpatient. Has outpatient follow-up appointment with heart failure service, Dr. Shirlee Latch on 02/22/2023.  Recommend BMP in 1 week.   Acute renal failure on CKD stage IIIa Patient presenting with a creatinine elevated 1.64 with baseline 1.2.  Etiology likely secondary to volume overload, cardiorenal syndrome.  Patient was aggressively diuresed with improvement of her creatinine to 1.34 at time of discharge.  Recommend repeat BMP 1 week.   Hypokalemia Repleted during hospitalization.  Discharging on potassium 40 mEq twice daily.  BMP 1  Physician Discharge Summary  Nichole Johnson YNW:[id number] DOB: 12-15-1945 DOA: 02/02/2023  PCP: [name], MD  Admit date: 02/02/2023 Discharge date: 02/09/2023  Admitted From: Home Disposition: Home  Recommendations for Outpatient Follow-up:  Follow up with PCP in 1-2 weeks Follow-up with cardiology, Dr. Shirlee Latch as scheduled on 02/22/2023 Torsemide increased to 40 mg p.o. every morning, 20 mg p.o. every afternoon May use additional 20 mg torsemide in the a.m. for total dose 60 mg for shortness of breath/weight gain Started on potassium supplementation, 40 mEq POP BID for hypokalemia Please obtain BMP in one week to assess potassium level and creatinine  Home Health: No needs identified by physical therapy Equipment/Devices: None  Discharge Condition: Stable CODE STATUS: Full code Diet recommendation: Heart healthy diet, 1500 mL fluid restriction  History of present illness:  Nichole Johnson is a 77 y.o. 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  Reportedly only can ambulate a few feet before getting severely short winded.  Also notable for orthopnea.  Denies chest pain, no significant lower extremity edema, no weight gain.  Reports compliance with her home cardiac medications.   In the ED, Temperature 98.8. BP 98/65, pulse 80, respiratory 18, O2 saturation 100% on room air.  WBC 9.9, hemoglobin 12.5, platelet count 259.  Sodium 132, serum bicarb 20, BUN 46, creatinine 1.64, GFR 32. BNP 3869. Troponin 265, 239.  BNP 3869.9.  Chest x-ray with cardiomegaly with mild central vascular pulmonary congestion, large hiatal hernia.  EKG with NSR, rate 85, QTc 459, no  concerning ST elevation/depressions or T wave inversions.  Patient received IV Lasix 80 mg x 1 in the ED.  Cardiology consulted.  TRH consulted for admission for further evaluation and management of acute on chronic systolic congestive heart failure, acute renal failure on CKD stage IIIa.  Hospital course:  Acute on chronic systolic congestive heart failure History of nonischemic cardiomyopathy Patient presenting with progressive shortness of breath.  History of NI CM with LVEF 25%, recent NSTEMI and recently discharged by the cardiology service on 01/26/2023.  Patient reports compliance with her home medications, no significant lower extremity edema and weights have been stable.  Patient endorses orthopnea, dyspnea on exertion.  BNP elevated 3169, chest x-ray with vascular pulmonary congestion.  Recent TTE 10/12 with LVEF 25-30%, LV hypokinesis, grade 3 diastolic dysfunction, moderate MR. Cardiology was consulted and followed during hospital course.  Patient was initially started on IV furosemide and received 1 dose of metolazone with good diuresis.  Patient will discharge on torsemide 40 mg p.o. every morning and 20 mg p.o. every afternoon.  Unable to tolerate further GDMT due to hypotension.  Patient was recommended for right heart catheterization and TEE but has declined at this point, recommend follow-up outpatient. Has outpatient follow-up appointment with heart failure service, Dr. Shirlee Latch on 02/22/2023.  Recommend BMP in 1 week.   Acute renal failure on CKD stage IIIa Patient presenting with a creatinine elevated 1.64 with baseline 1.2.  Etiology likely secondary to volume overload, cardiorenal syndrome.  Patient was aggressively diuresed with improvement of her creatinine to 1.34 at time of discharge.  Recommend repeat BMP 1 week.   Hypokalemia Repleted during hospitalization.  Discharging on potassium 40 mEq twice daily.  BMP 1  Laurey Morale, MD. Nyra Capes on 02/22/2023.   Specialty: Cardiology Contact information: [address] [place] [address] [phone]                Allergies  Allergen Reactions   Penicillins Itching and Other (See Comments)    Vulvovaginal Candidiasis with associated pruritis  Vaginal Infections  Vaginal Infections    Vulvovaginal Candidiasis with associated pruritis    Vulvovaginal Candidiasis with associated pruritis Vaginal Infections Vaginal Infections   Sulfamethoxazole-Trimethoprim Other (See Comments)    Makes her shaky and sweat   Tramadol Other (See Comments)    Makes her shaky and sweat    Consultations: Cardiology, advanced heart failure team   Procedures/Studies: DG Chest 1 View  Result Date: 02/02/2023 CLINICAL DATA:  Shortness of breath EXAM: CHEST  1 VIEW COMPARISON:  Chest x-ray 01/21/2023 FINDINGS: The heart is enlarged. There is a large hiatal hernia with air-fluid level. There is mild central pulmonary vascular congestion. There is no focal lung consolidation, pleural effusion or pneumothorax. No acute fractures are seen. IMPRESSION: 1. Cardiomegaly with mild central pulmonary vascular congestion. 2. Large hiatal hernia. Electronically Signed   By: Darliss Cheney M.D.   On: 02/02/2023 20:45   CARDIAC CATHETERIZATION  Result Date: 01/25/2023 Images from the original result were not included. Right & Left Heart Catheterization 01/25/23: Hemodynamic data: LV 100/2, EDP 9 mmHg.  Ao 148/66, mean 95 mmHg.  No pressure gradient across the aortic valve. RA:  3/2, mean 2 mmHg. RV 37/2, EDP 5 mmHg. PA 36/15, mean 22 mmHg.  PA saturation 64%. PW 12/12, mean 10 mmHg.  Aortic saturation 94%. QP/QS 1.00. CO 4.3, CI 2.86 by Fick. Angiographic data: LM: Large-caliber vessel.  It smooth and normal. LAD: Large-caliber vessel, gives origin to a moderate-sized D1 and several small diagonals.  It is small and normal. LCx: Large-caliber vessel giving origin to a very tiny OM1 and OM 2 and large OM 3.  Moderate sized AV groove circumflex.  The OM 3 has a focal 80% thrombotic lesion that appears to be organized.  There is TIMI-3 flow distally. RCA: Moderate caliber vessel, dominant, smooth and normal. Impression and recommendations: PA pressure upper limit of normal with low LVEDP, fluoroscopy reveals significant pulmonary parenchymal markings, will probably need follow-up. A focal thrombotic stenosis noted in the large OM 3 branch of CX. Will discontinue aspirin as patient does not have any significant coronary artery disease, focal thrombus appears to be probably related to recent COVID infection.  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  I have started the patient on Eliquis 5 mg p.o. twice daily, can be used for at least 3 months.  10 to 13 mL contrast utilized.   ECHOCARDIOGRAM COMPLETE  Result Date: 01/23/2023    ECHOCARDIOGRAM REPORT   Patient Name:   Nichole Johnson Date of Exam: 01/23/2023 Medical Rec #:  [id number]    Height:       59.0 in Accession #:    [id number]   Weight:       124.1 lb Date of Birth:  Oct 01, 1945    BSA:          1.506 m Patient Age:    77 years     BP:           85/64 mmHg Patient Gender: F            HR:  Laurey Morale, MD. Nyra Capes on 02/22/2023.   Specialty: Cardiology Contact information: [address] [place] [address] [phone]                Allergies  Allergen Reactions   Penicillins Itching and Other (See Comments)    Vulvovaginal Candidiasis with associated pruritis  Vaginal Infections  Vaginal Infections    Vulvovaginal Candidiasis with associated pruritis    Vulvovaginal Candidiasis with associated pruritis Vaginal Infections Vaginal Infections   Sulfamethoxazole-Trimethoprim Other (See Comments)    Makes her shaky and sweat   Tramadol Other (See Comments)    Makes her shaky and sweat    Consultations: Cardiology, advanced heart failure team   Procedures/Studies: DG Chest 1 View  Result Date: 02/02/2023 CLINICAL DATA:  Shortness of breath EXAM: CHEST  1 VIEW COMPARISON:  Chest x-ray 01/21/2023 FINDINGS: The heart is enlarged. There is a large hiatal hernia with air-fluid level. There is mild central pulmonary vascular congestion. There is no focal lung consolidation, pleural effusion or pneumothorax. No acute fractures are seen. IMPRESSION: 1. Cardiomegaly with mild central pulmonary vascular congestion. 2. Large hiatal hernia. Electronically Signed   By: Darliss Cheney M.D.   On: 02/02/2023 20:45   CARDIAC CATHETERIZATION  Result Date: 01/25/2023 Images from the original result were not included. Right & Left Heart Catheterization 01/25/23: Hemodynamic data: LV 100/2, EDP 9 mmHg.  Ao 148/66, mean 95 mmHg.  No pressure gradient across the aortic valve. RA:  3/2, mean 2 mmHg. RV 37/2, EDP 5 mmHg. PA 36/15, mean 22 mmHg.  PA saturation 64%. PW 12/12, mean 10 mmHg.  Aortic saturation 94%. QP/QS 1.00. CO 4.3, CI 2.86 by Fick. Angiographic data: LM: Large-caliber vessel.  It smooth and normal. LAD: Large-caliber vessel, gives origin to a moderate-sized D1 and several small diagonals.  It is small and normal. LCx: Large-caliber vessel giving origin to a very tiny OM1 and OM 2 and large OM 3.  Moderate sized AV groove circumflex.  The OM 3 has a focal 80% thrombotic lesion that appears to be organized.  There is TIMI-3 flow distally. RCA: Moderate caliber vessel, dominant, smooth and normal. Impression and recommendations: PA pressure upper limit of normal with low LVEDP, fluoroscopy reveals significant pulmonary parenchymal markings, will probably need follow-up. A focal thrombotic stenosis noted in the large OM 3 branch of CX. Will discontinue aspirin as patient does not have any significant coronary artery disease, focal thrombus appears to be probably related to recent COVID infection.  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  I have started the patient on Eliquis 5 mg p.o. twice daily, can be used for at least 3 months.  10 to 13 mL contrast utilized.   ECHOCARDIOGRAM COMPLETE  Result Date: 01/23/2023    ECHOCARDIOGRAM REPORT   Patient Name:   Nichole Johnson Date of Exam: 01/23/2023 Medical Rec #:  [id number]    Height:       59.0 in Accession #:    [id number]   Weight:       124.1 lb Date of Birth:  Oct 01, 1945    BSA:          1.506 m Patient Age:    77 years     BP:           85/64 mmHg Patient Gender: F            HR:  Physician Discharge Summary  Nichole Johnson YNW:[id number] DOB: 12-15-1945 DOA: 02/02/2023  PCP: [name], MD  Admit date: 02/02/2023 Discharge date: 02/09/2023  Admitted From: Home Disposition: Home  Recommendations for Outpatient Follow-up:  Follow up with PCP in 1-2 weeks Follow-up with cardiology, Dr. Shirlee Latch as scheduled on 02/22/2023 Torsemide increased to 40 mg p.o. every morning, 20 mg p.o. every afternoon May use additional 20 mg torsemide in the a.m. for total dose 60 mg for shortness of breath/weight gain Started on potassium supplementation, 40 mEq POP BID for hypokalemia Please obtain BMP in one week to assess potassium level and creatinine  Home Health: No needs identified by physical therapy Equipment/Devices: None  Discharge Condition: Stable CODE STATUS: Full code Diet recommendation: Heart healthy diet, 1500 mL fluid restriction  History of present illness:  Nichole Johnson is a 77 y.o. 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  Reportedly only can ambulate a few feet before getting severely short winded.  Also notable for orthopnea.  Denies chest pain, no significant lower extremity edema, no weight gain.  Reports compliance with her home cardiac medications.   In the ED, Temperature 98.8. BP 98/65, pulse 80, respiratory 18, O2 saturation 100% on room air.  WBC 9.9, hemoglobin 12.5, platelet count 259.  Sodium 132, serum bicarb 20, BUN 46, creatinine 1.64, GFR 32. BNP 3869. Troponin 265, 239.  BNP 3869.9.  Chest x-ray with cardiomegaly with mild central vascular pulmonary congestion, large hiatal hernia.  EKG with NSR, rate 85, QTc 459, no  concerning ST elevation/depressions or T wave inversions.  Patient received IV Lasix 80 mg x 1 in the ED.  Cardiology consulted.  TRH consulted for admission for further evaluation and management of acute on chronic systolic congestive heart failure, acute renal failure on CKD stage IIIa.  Hospital course:  Acute on chronic systolic congestive heart failure History of nonischemic cardiomyopathy Patient presenting with progressive shortness of breath.  History of NI CM with LVEF 25%, recent NSTEMI and recently discharged by the cardiology service on 01/26/2023.  Patient reports compliance with her home medications, no significant lower extremity edema and weights have been stable.  Patient endorses orthopnea, dyspnea on exertion.  BNP elevated 3169, chest x-ray with vascular pulmonary congestion.  Recent TTE 10/12 with LVEF 25-30%, LV hypokinesis, grade 3 diastolic dysfunction, moderate MR. Cardiology was consulted and followed during hospital course.  Patient was initially started on IV furosemide and received 1 dose of metolazone with good diuresis.  Patient will discharge on torsemide 40 mg p.o. every morning and 20 mg p.o. every afternoon.  Unable to tolerate further GDMT due to hypotension.  Patient was recommended for right heart catheterization and TEE but has declined at this point, recommend follow-up outpatient. Has outpatient follow-up appointment with heart failure service, Dr. Shirlee Latch on 02/22/2023.  Recommend BMP in 1 week.   Acute renal failure on CKD stage IIIa Patient presenting with a creatinine elevated 1.64 with baseline 1.2.  Etiology likely secondary to volume overload, cardiorenal syndrome.  Patient was aggressively diuresed with improvement of her creatinine to 1.34 at time of discharge.  Recommend repeat BMP 1 week.   Hypokalemia Repleted during hospitalization.  Discharging on potassium 40 mEq twice daily.  BMP 1  Physician Discharge Summary  Nichole Johnson YNW:[id number] DOB: 12-15-1945 DOA: 02/02/2023  PCP: [name], MD  Admit date: 02/02/2023 Discharge date: 02/09/2023  Admitted From: Home Disposition: Home  Recommendations for Outpatient Follow-up:  Follow up with PCP in 1-2 weeks Follow-up with cardiology, Dr. Shirlee Latch as scheduled on 02/22/2023 Torsemide increased to 40 mg p.o. every morning, 20 mg p.o. every afternoon May use additional 20 mg torsemide in the a.m. for total dose 60 mg for shortness of breath/weight gain Started on potassium supplementation, 40 mEq POP BID for hypokalemia Please obtain BMP in one week to assess potassium level and creatinine  Home Health: No needs identified by physical therapy Equipment/Devices: None  Discharge Condition: Stable CODE STATUS: Full code Diet recommendation: Heart healthy diet, 1500 mL fluid restriction  History of present illness:  Nichole Johnson is a 77 y.o. 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  Reportedly only can ambulate a few feet before getting severely short winded.  Also notable for orthopnea.  Denies chest pain, no significant lower extremity edema, no weight gain.  Reports compliance with her home cardiac medications.   In the ED, Temperature 98.8. BP 98/65, pulse 80, respiratory 18, O2 saturation 100% on room air.  WBC 9.9, hemoglobin 12.5, platelet count 259.  Sodium 132, serum bicarb 20, BUN 46, creatinine 1.64, GFR 32. BNP 3869. Troponin 265, 239.  BNP 3869.9.  Chest x-ray with cardiomegaly with mild central vascular pulmonary congestion, large hiatal hernia.  EKG with NSR, rate 85, QTc 459, no  concerning ST elevation/depressions or T wave inversions.  Patient received IV Lasix 80 mg x 1 in the ED.  Cardiology consulted.  TRH consulted for admission for further evaluation and management of acute on chronic systolic congestive heart failure, acute renal failure on CKD stage IIIa.  Hospital course:  Acute on chronic systolic congestive heart failure History of nonischemic cardiomyopathy Patient presenting with progressive shortness of breath.  History of NI CM with LVEF 25%, recent NSTEMI and recently discharged by the cardiology service on 01/26/2023.  Patient reports compliance with her home medications, no significant lower extremity edema and weights have been stable.  Patient endorses orthopnea, dyspnea on exertion.  BNP elevated 3169, chest x-ray with vascular pulmonary congestion.  Recent TTE 10/12 with LVEF 25-30%, LV hypokinesis, grade 3 diastolic dysfunction, moderate MR. Cardiology was consulted and followed during hospital course.  Patient was initially started on IV furosemide and received 1 dose of metolazone with good diuresis.  Patient will discharge on torsemide 40 mg p.o. every morning and 20 mg p.o. every afternoon.  Unable to tolerate further GDMT due to hypotension.  Patient was recommended for right heart catheterization and TEE but has declined at this point, recommend follow-up outpatient. Has outpatient follow-up appointment with heart failure service, Dr. Shirlee Latch on 02/22/2023.  Recommend BMP in 1 week.   Acute renal failure on CKD stage IIIa Patient presenting with a creatinine elevated 1.64 with baseline 1.2.  Etiology likely secondary to volume overload, cardiorenal syndrome.  Patient was aggressively diuresed with improvement of her creatinine to 1.34 at time of discharge.  Recommend repeat BMP 1 week.   Hypokalemia Repleted during hospitalization.  Discharging on potassium 40 mEq twice daily.  BMP 1

## 2023-02-09 NOTE — TOC Transition Note (Signed)
Transition of Care [facility]) - CM/SW Discharge Note   Patient Details  Name: Nichole Johnson MRN: [id number] Date of Birth: 1946-03-07  Transition of Care [facility]) CM/SW Contact:  Leone Haven, RN Phone Number: 02/09/2023, 10:04 AM   Clinical Narrative:    Patient for dc today, Previous NCM set patient up with [facility] service and DME.  This NCM gave patient eliquis 30 free trial coupon to give to pharmacist.  She has transport.   Final next level of care: Home w Home Health Services Barriers to Discharge: No Barriers Identified   Patient Goals and CMS Choice CMS Medicare.gov Compare Post Acute Care list provided to:: Patient Choice offered to / list presented to : Patient  Discharge Placement                         Discharge Plan and Services Additional resources added to the After Visit Summary for       Post Acute Care Choice: Home Health          DME Arranged: Walker rolling with seat DME Agency: [employer] Date DME Agency Contacted: 02/05/23 Time DME Agency Contacted: [phone] Representative spoke with at DME Agency: Mitch HH Arranged: RN, PT, OT [facility] Agency: [facility] Date [facility] Agency Contacted: 02/05/23 Time HH Agency Contacted: [phone] Representative spoke with at [facility] Agency: Tresa Endo  Social Determinants of Health (SDOH) Interventions SDOH Screenings   Food Insecurity: No Food Insecurity (01/22/2023)  Housing: Low Risk  (01/22/2023)  Transportation Needs: No Transportation Needs (01/22/2023)  Utilities: Not At Risk (01/22/2023)  Social Connections: Unknown (06/27/2022)   Received from [facility], [facility]  Tobacco Use: Low Risk  (02/03/2023)     Readmission Risk Interventions     No data to display

## 2023-02-09 NOTE — Progress Notes (Signed)
Pt placed on overnight pulse ox. 

## 2023-02-09 NOTE — Plan of Care (Signed)
  Problem: Education: Goal: Knowledge of General Education information will improve Description: Including pain rating scale, medication(s)/side effects and non-pharmacologic comfort measures Outcome: Progressing   Problem: Clinical Measurements: Goal: Diagnostic test results will improve Outcome: Progressing Goal: Respiratory complications will improve Outcome: Progressing   Problem: Coping: Goal: Level of anxiety will decrease Outcome: Progressing   Problem: Safety: Goal: Ability to remain free from injury will improve Outcome: Progressing

## 2023-02-13 ENCOUNTER — Emergency Department (HOSPITAL_BASED_OUTPATIENT_CLINIC_OR_DEPARTMENT_OTHER)
Admission: EM | Admit: 2023-02-13 | Discharge: 2023-02-13 | Disposition: A | Discharge status: Home / Self Care | Payer: Medicare HMO | Attending: Emergency Medicine | Admitting: Emergency Medicine

## 2023-02-13 ENCOUNTER — Emergency Department (HOSPITAL_BASED_OUTPATIENT_CLINIC_OR_DEPARTMENT_OTHER): Payer: Medicare HMO

## 2023-02-13 ENCOUNTER — Other Ambulatory Visit: Payer: Self-pay

## 2023-02-13 ENCOUNTER — Encounter (HOSPITAL_BASED_OUTPATIENT_CLINIC_OR_DEPARTMENT_OTHER): Payer: Self-pay | Admitting: Urology

## 2023-02-13 DIAGNOSIS — Z8679 Personal history of other diseases of the circulatory system: Secondary | ICD-10-CM

## 2023-02-13 DIAGNOSIS — R109 Unspecified abdominal pain: Secondary | ICD-10-CM | POA: Insufficient documentation

## 2023-02-13 DIAGNOSIS — Z7901 Long term (current) use of anticoagulants: Secondary | ICD-10-CM | POA: Diagnosis not present

## 2023-02-13 DIAGNOSIS — E875 Hyperkalemia: Secondary | ICD-10-CM | POA: Diagnosis not present

## 2023-02-13 DIAGNOSIS — I509 Heart failure, unspecified: Secondary | ICD-10-CM | POA: Insufficient documentation

## 2023-02-13 DIAGNOSIS — R5383 Other fatigue: Secondary | ICD-10-CM | POA: Diagnosis present

## 2023-02-13 DIAGNOSIS — E860 Dehydration: Secondary | ICD-10-CM | POA: Diagnosis not present

## 2023-02-13 DIAGNOSIS — Z79899 Other long term (current) drug therapy: Secondary | ICD-10-CM | POA: Diagnosis not present

## 2023-02-13 LAB — CBC
HCT: 45.1 % (ref 36.0–46.0)
Hemoglobin: 14 g/dL (ref 12.0–15.0)
MCH: 26.5 pg (ref 26.0–34.0)
MCHC: 31 g/dL (ref 30.0–36.0)
MCV: 85.3 fL (ref 80.0–100.0)
Platelets: 363 10*3/uL (ref 150–400)
RBC: 5.29 MIL/uL — ABNORMAL HIGH (ref 3.87–5.11)
RDW: 16.8 % — ABNORMAL HIGH (ref 11.5–15.5)
WBC: 7 10*3/uL (ref 4.0–10.5)
nRBC: 0 % (ref 0.0–0.2)

## 2023-02-13 LAB — URINALYSIS, MICROSCOPIC (REFLEX)

## 2023-02-13 LAB — COMPREHENSIVE METABOLIC PANEL
ALT: 98 U/L — ABNORMAL HIGH (ref 0–44)
AST: 55 U/L — ABNORMAL HIGH (ref 15–41)
Albumin: 3.9 g/dL (ref 3.5–5.0)
Alkaline Phosphatase: 92 U/L (ref 38–126)
Anion gap: 12 (ref 5–15)
BUN: 55 mg/dL — ABNORMAL HIGH (ref 8–23)
CO2: 24 mmol/L (ref 22–32)
Calcium: 9.5 mg/dL (ref 8.9–10.3)
Chloride: 97 mmol/L — ABNORMAL LOW (ref 98–111)
Creatinine, Ser: 1.68 mg/dL — ABNORMAL HIGH (ref 0.44–1.00)
GFR, Estimated: 31 mL/min — ABNORMAL LOW (ref >60)
Glucose, Bld: 164 mg/dL — ABNORMAL HIGH (ref 70–99)
Potassium: 5.5 mmol/L — ABNORMAL HIGH (ref 3.5–5.1)
Sodium: 133 mmol/L — ABNORMAL LOW (ref 135–145)
Total Bilirubin: 1.3 mg/dL — ABNORMAL HIGH (ref 0.3–1.2)
Total Protein: 7.6 g/dL (ref 6.5–8.1)

## 2023-02-13 LAB — LACTIC ACID, PLASMA
Lactic Acid, Venous: 2.4 mmol/L (ref 0.5–1.9)
Lactic Acid, Venous: 2.2 mmol/L (ref 0.5–1.9)

## 2023-02-13 LAB — URINALYSIS, ROUTINE W REFLEX MICROSCOPIC
Bilirubin Urine: NEGATIVE
Glucose, UA: 250 mg/dL — AB
Ketones, ur: NEGATIVE mg/dL
Leukocytes,Ua: NEGATIVE
Nitrite: NEGATIVE
Protein, ur: NEGATIVE mg/dL
Specific Gravity, Urine: 1.01 (ref 1.005–1.030)
pH: 6 (ref 5.0–8.0)

## 2023-02-13 LAB — TROPONIN I (HIGH SENSITIVITY)
Troponin I (High Sensitivity): 34 ng/L — ABNORMAL HIGH (ref <18)
Troponin I (High Sensitivity): 35 ng/L — ABNORMAL HIGH (ref <18)

## 2023-02-13 LAB — BRAIN NATRIURETIC PEPTIDE: B Natriuretic Peptide: 2417.8 pg/mL — ABNORMAL HIGH (ref 0.0–100.0)

## 2023-02-13 MED ORDER — SODIUM CHLORIDE 0.9 % IV BOLUS
500.0000 mL | Freq: Once | INTRAVENOUS | Status: AC
  Administered 2023-02-13: 500 mL via INTRAVENOUS

## 2023-02-13 MED ORDER — IOHEXOL 350 MG/ML SOLN
80.0000 mL | Freq: Once | INTRAVENOUS | Status: AC | PRN
  Administered 2023-02-13: 80 mL via INTRAVENOUS

## 2023-02-13 NOTE — ED Notes (Signed)
Patient transported to CT 

## 2023-02-13 NOTE — ED Provider Notes (Signed)
[facility] Provider Note   CSN: [id number] Arrival date & time: 02/13/23  1545     History  Chief Complaint  Patient presents with   Dehydration    Nichole Johnson is a 77 y.o. female here for eval of possible dehydration.  Recent admission for NSTEMI with CHF, subsequent discharge, readmission for CHF exacerbation.  Started get some fatigue yesterday.  Her home health nurse noted that she was possibly dehydrated as her blood pressures are little bit lower than normal.  She has been on a fluid restriction and taking Lasix in the morning and torsemide in the evening.  He is also taking 80 mg of potassium daily.  Did note over the last year she has had same right sided abdominal pain in her right pain into her chest.  She denies any shortness of breath, lower extremity swelling, PND, orthopnea.  She is anticoagulated.  No fever, nausea, vomiting, back pain, numbness or weakness.  She states her blood pressures at baseline in the high 90s.  HPI     Home Medications Prior to Admission medications   Medication Sig Start Date End Date Taking? Authorizing Provider  albuterol (PROVENTIL HFA;VENTOLIN HFA) 108 (90 Base) MCG/ACT inhaler Inhale 2 puffs into the lungs every 6 (six) hours as needed for wheezing or shortness of breath. 01/01/16   Mannam, Colbert Coyer, MD  alendronate (FOSAMAX) 35 MG tablet Take 35 mg by mouth once a week. 01/08/23   [provider]  apixaban (ELIQUIS) 5 MG TABS tablet Take 1 tablet (5 mg total) by mouth 2 (two) times daily. 02/09/23 05/10/23  [place], Eric J, DO  ARNUITY ELLIPTA 100 MCG/ACT AEPB Inhale 1 puff into the lungs daily. 01/29/23 01/29/24  [provider]  atorvastatin (LIPITOR) 40 MG tablet Take 1 tablet (40 mg total) by mouth daily. 02/09/23   [place], Alvira Philips, DO  Biotin 5000 MCG TABS Take 1 tablet by mouth daily.    [provider]  brinzolamide (AZOPT) 1 % ophthalmic suspension Place 1 drop  into both eyes 2 (two) times daily.    [provider]  Calcium Carbonate-Vitamin D (CALCIUM + D PO) Take 1 tablet by mouth 2 (two) times daily.     [provider]  Cholecalciferol 25 MCG (1000 UT) tablet Take 1,000 Units by mouth daily.     [provider]  dapagliflozin propanediol (FARXIGA) 10 MG TABS tablet Take 1 tablet (10 mg total) by mouth daily. 01/27/23   Dunn, Tacey Ruiz, PA-C  ferrous sulfate 325 (65 FE) MG tablet Take 325 mg by mouth daily.    [provider]  fluticasone (FLONASE) 50 MCG/ACT nasal spray Place 2 sprays into both nostrils daily. Patient taking differently: Place 2 sprays into both nostrils daily as needed for allergies or rhinitis. 01/01/16   Mannam, Colbert Coyer, MD  Hypromellose (GENTEAL OP) Place 1 drop into both eyes in the morning, at noon, in the evening, and at bedtime.    [provider]  latanoprost (XALATAN) 0.005 % ophthalmic solution Place 1 drop into both eyes at bedtime.    [provider]  levothyroxine (SYNTHROID, LEVOTHROID) 100 MCG tablet Take 100 mcg by mouth daily. 07/20/16   [provider]  Loratadine 10 MG CAPS Take 10 mg by mouth daily.    [provider]  metoprolol succinate (TOPROL-XL) 25 MG 24 hr tablet Take 1 tablet (25 mg total) by mouth daily. 01/26/23   Laurann Montana, PA-C  Multiple Vitamins-Minerals (CENTRUM SILVER PO) Take 1 tablet by mouth every morning.    [provider]  nitroGLYCERIN (NITROSTAT) 0.4 MG SL tablet Place 1 tablet (0.4 mg total) under the tongue every 5 (five) minutes as needed for chest pain (up to 3 doses). 01/26/23   Dunn, Tacey Ruiz, PA-C  potassium chloride SA (KLOR-CON M) 20 MEQ tablet Take 2 tablets (40 mEq total) by mouth 2 (two) times daily. 02/09/23 05/10/23  [place], Alvira Philips, DO  torsemide (DEMADEX) 20 MG tablet Take 2 tablets (40 mg total) by mouth every morning AND 1 tablet (20 mg total) daily at 6 PM. 02/09/23 05/10/23  [place], Alvira Philips, DO   torsemide (DEMADEX) 20 MG tablet Take 1 tablet (20 mg total) by mouth daily as needed (Take additional 20mg  daily (in addition to 40mg  AM dose) for shortness of breath/weight gain (total dose 60mg )). 02/09/23   [place], Eric J, DO  Travoprost, BAK Free, (TRAVATAN) 0.004 % SOLN ophthalmic solution Place 1 drop into both eyes at bedtime. 02/09/23   [place], Alvira Philips, DO  vitamin B-12 (CYANOCOBALAMIN) 1000 MCG tablet Take 1,000 mcg by mouth daily.    [provider]  vitamin C (ASCORBIC ACID) 500 MG tablet Take 500 mg by mouth daily.    [provider]      Allergies    Penicillins, Sulfamethoxazole-trimethoprim, and Tramadol    Review of Systems   Review of Systems  Constitutional:  Positive for fatigue.  HENT: Negative.    Respiratory: Negative.    Cardiovascular: Negative.   Gastrointestinal:  Positive for abdominal pain.  Genitourinary: Negative.   Musculoskeletal: Negative.   Skin: Negative.   Neurological: Negative.   All other systems reviewed and are negative.   Physical Exam Updated Vital Signs BP 94/67   Pulse (!) 104   Temp 97.9 F (36.6 C)   Resp 20   Ht 4\' 11"  (1.499 m)   Wt 52.3 kg   SpO2 97%   BMI 23.29 kg/m  Physical Exam Vitals and nursing note reviewed.  Constitutional:      General: She is not in acute distress.    Appearance: She is well-developed. She is ill-appearing. She is not toxic-appearing or diaphoretic.     Comments: Chronically ill appearing  HENT:     Head: Normocephalic and atraumatic.     Nose: Nose normal.     Mouth/Throat:     Mouth: Mucous membranes are moist.  Eyes:     Pupils: Pupils are equal, round, and reactive to light.  Cardiovascular:     Rate and Rhythm: Normal rate.     Pulses: Normal pulses.     Heart sounds: Normal heart sounds.  Pulmonary:     Effort: Pulmonary effort is normal. No respiratory distress.     Breath sounds: Normal breath sounds.  Abdominal:     General: Bowel sounds are normal. There  is no distension.     Palpations: Abdomen is soft.     Comments: Mild tenderness at rib border, no crepitus or step-off.  Negative Murphy sign  Musculoskeletal:        General: No swelling, tenderness, deformity or signs of injury. Normal range of motion.     Cervical back: Normal range of motion.     Right lower leg: No edema.     Left lower leg: No edema.     Comments: No bony tenderness, compartments soft, full range of motion  Skin:    General: Skin is warm  and dry.     Capillary Refill: Capillary refill takes less than 2 seconds.     Comments: No obvious rash on exposed skin  Neurological:     General: No focal deficit present.     Mental Status: She is alert and oriented to person, place, and time.     Cranial Nerves: No cranial nerve deficit.     Motor: No weakness.     Gait: Gait normal.  Psychiatric:        Mood and Affect: Mood normal.     ED Results / Procedures / Treatments   Labs (all labs ordered are listed, but only abnormal results are displayed) Labs Reviewed  CBC - Abnormal; Notable for the following components:      Result Value   RBC 5.29 (*)    RDW 16.8 (*)    All other components within normal limits  URINALYSIS, ROUTINE W REFLEX MICROSCOPIC - Abnormal; Notable for the following components:   Glucose, UA 250 (*)    Hgb urine dipstick SMALL (*)    All other components within normal limits  COMPREHENSIVE METABOLIC PANEL - Abnormal; Notable for the following components:   Sodium 133 (*)    Potassium 5.5 (*)    Chloride 97 (*)    Glucose, Bld 164 (*)    BUN 55 (*)    Creatinine, Ser 1.68 (*)    AST 55 (*)    ALT 98 (*)    Total Bilirubin 1.3 (*)    GFR, Estimated 31 (*)    All other components within normal limits  LACTIC ACID, PLASMA - Abnormal; Notable for the following components:   Lactic Acid, Venous 2.4 (*)    All other components within normal limits  LACTIC ACID, PLASMA - Abnormal; Notable for the following components:   Lactic Acid,  Venous 2.2 (*)    All other components within normal limits  BRAIN NATRIURETIC PEPTIDE - Abnormal; Notable for the following components:   B Natriuretic Peptide 2,417.8 (*)    All other components within normal limits  URINALYSIS, MICROSCOPIC (REFLEX) - Abnormal; Notable for the following components:   Bacteria, UA RARE (*)    All other components within normal limits  TROPONIN I (HIGH SENSITIVITY) - Abnormal; Notable for the following components:   Troponin I (High Sensitivity) 34 (*)    All other components within normal limits  TROPONIN I (HIGH SENSITIVITY) - Abnormal; Notable for the following components:   Troponin I (High Sensitivity) 35 (*)    All other components within normal limits  CULTURE, BLOOD (ROUTINE X 2)  CULTURE, BLOOD (ROUTINE X 2)    EKG EKG Interpretation Date/Time:  Saturday February 13 2023 16:11:17 EDT Ventricular Rate:  113 PR Interval:  178 QRS Duration:  103 QT Interval:  327 QTC Calculation: 449 R Axis:   -77  Text Interpretation: duplicate Confirmed by Arby Barrette [phone]) on 02/13/2023 4:38:15 PM  Radiology CT Angio Chest PE W and/or Wo Contrast  Result Date: 02/13/2023 CLINICAL DATA:  Pulmonary embolism (PE) suspected, high prob; Abdominal pain, acute, nonlocalized right upper chest/ abd pain. CHF exacerbation, progressive fatigue, right flank pain, dehydration. EXAM: CT ANGIOGRAPHY CHEST CT ABDOMEN AND PELVIS WITH CONTRAST TECHNIQUE: Multidetector CT imaging of the chest was performed using the standard protocol during bolus administration of intravenous contrast. Multiplanar CT image reconstructions and MIPs were obtained to evaluate the vascular anatomy. Multidetector CT imaging of the abdomen and pelvis was performed using the standard protocol during bolus administration  of intravenous contrast. RADIATION DOSE REDUCTION: This exam was performed according to the departmental dose-optimization program which includes automated exposure control,  adjustment of the mA and/or kV according to patient size and/or use of iterative reconstruction technique. CONTRAST:  80mL OMNIPAQUE IOHEXOL 350 MG/ML SOLN COMPARISON:  CT chest 08/06/2022, CT abdomen pelvis 10/25/2017 FINDINGS: CTA CHEST FINDINGS Cardiovascular: There is adequate opacification of the pulmonary arterial tree. No intraluminal filling defect identified through the segmental level to suggest acute pulmonary embolism. The central pulmonary arteries are of normal caliber. Mild global cardiomegaly. Reflux of contrast into the hepatic venous system is in keeping with at least some degree of right heart failure. No significant coronary artery calcification. No pericardial effusion. Mild atherosclerotic calcification within the thoracic aorta. No aortic aneurysm. Mediastinum/Nodes: Visualized thyroid is unremarkable. No pathologic thoracic adenopathy. Large hiatal hernia. Lungs/Pleura: Mild parenchymal scarring within the left upper lobe. Mild left basilar atelectasis. Lungs are otherwise clear. No pneumothorax or pleural effusion. No central obstructing lesion. Musculoskeletal: Osseous structures are age-appropriate. No acute bone abnormality. Review of the MIP images confirms the above findings. CT ABDOMEN and PELVIS FINDINGS Hepatobiliary: No focal liver abnormality is seen. No gallstones, gallbladder wall thickening, or biliary dilatation. Pancreas: Unremarkable Spleen: Unremarkable Adrenals/Urinary Tract: The adrenal glands are unremarkable. The kidneys are normal in size and position. Mild asymmetric renal cortical scarring involving the interpolar region of the left kidney anteriorly. Small junctional defect identified within the interpolar region of the right kidney. Simple cortical cysts are seen within the kidneys bilaterally for which no follow-up imaging is recommended. The kidneys are otherwise unremarkable. Bladder unremarkable. Stomach/Bowel: Aside from a large hiatal hernia with the majority  of the stomach within the thoracic compartment, the stomach, small bowel, and large bowel are unremarkable. Appendix normal. No free intraperitoneal gas or fluid. Vascular/Lymphatic: Aortic atherosclerosis. No enlarged abdominal or pelvic lymph nodes. Reproductive: Uterus and bilateral adnexa are unremarkable. Other: Small fat containing left inguinal hernia. Musculoskeletal: Osseous structures are age-appropriate. No acute bone abnormality. No lytic or blastic bone lesion. Review of the MIP images confirms the above findings. IMPRESSION: 1. No pulmonary embolism. No acute intrathoracic pathology identified. 2. Mild global cardiomegaly with reflux of contrast into the hepatic venous system in keeping with at least some degree of right heart failure. 3. Large hiatal hernia with the majority of the stomach within the thoracic compartment. 4. No acute intra-abdominal pathology identified. No definite radiographic explanation for the patient's reported symptoms. Aortic Atherosclerosis (ICD10-I70.0). Electronically Signed   By: Helyn Numbers M.D.   On: 02/13/2023 19:06   CT ABDOMEN PELVIS W CONTRAST  Result Date: 02/13/2023 CLINICAL DATA:  Pulmonary embolism (PE) suspected, high prob; Abdominal pain, acute, nonlocalized right upper chest/ abd pain. CHF exacerbation, progressive fatigue, right flank pain, dehydration. EXAM: CT ANGIOGRAPHY CHEST CT ABDOMEN AND PELVIS WITH CONTRAST TECHNIQUE: Multidetector CT imaging of the chest was performed using the standard protocol during bolus administration of intravenous contrast. Multiplanar CT image reconstructions and MIPs were obtained to evaluate the vascular anatomy. Multidetector CT imaging of the abdomen and pelvis was performed using the standard protocol during bolus administration of intravenous contrast. RADIATION DOSE REDUCTION: This exam was performed according to the departmental dose-optimization program which includes automated exposure control, adjustment of  the mA and/or kV according to patient size and/or use of iterative reconstruction technique. CONTRAST:  80mL OMNIPAQUE IOHEXOL 350 MG/ML SOLN COMPARISON:  CT chest 08/06/2022, CT abdomen pelvis 10/25/2017 FINDINGS: CTA CHEST FINDINGS Cardiovascular: There is adequate opacification of  the pulmonary arterial tree. No intraluminal filling defect identified through the segmental level to suggest acute pulmonary embolism. The central pulmonary arteries are of normal caliber. Mild global cardiomegaly. Reflux of contrast into the hepatic venous system is in keeping with at least some degree of right heart failure. No significant coronary artery calcification. No pericardial effusion. Mild atherosclerotic calcification within the thoracic aorta. No aortic aneurysm. Mediastinum/Nodes: Visualized thyroid is unremarkable. No pathologic thoracic adenopathy. Large hiatal hernia. Lungs/Pleura: Mild parenchymal scarring within the left upper lobe. Mild left basilar atelectasis. Lungs are otherwise clear. No pneumothorax or pleural effusion. No central obstructing lesion. Musculoskeletal: Osseous structures are age-appropriate. No acute bone abnormality. Review of the MIP images confirms the above findings. CT ABDOMEN and PELVIS FINDINGS Hepatobiliary: No focal liver abnormality is seen. No gallstones, gallbladder wall thickening, or biliary dilatation. Pancreas: Unremarkable Spleen: Unremarkable Adrenals/Urinary Tract: The adrenal glands are unremarkable. The kidneys are normal in size and position. Mild asymmetric renal cortical scarring involving the interpolar region of the left kidney anteriorly. Small junctional defect identified within the interpolar region of the right kidney. Simple cortical cysts are seen within the kidneys bilaterally for which no follow-up imaging is recommended. The kidneys are otherwise unremarkable. Bladder unremarkable. Stomach/Bowel: Aside from a large hiatal hernia with the majority of the stomach  within the thoracic compartment, the stomach, small bowel, and large bowel are unremarkable. Appendix normal. No free intraperitoneal gas or fluid. Vascular/Lymphatic: Aortic atherosclerosis. No enlarged abdominal or pelvic lymph nodes. Reproductive: Uterus and bilateral adnexa are unremarkable. Other: Small fat containing left inguinal hernia. Musculoskeletal: Osseous structures are age-appropriate. No acute bone abnormality. No lytic or blastic bone lesion. Review of the MIP images confirms the above findings. IMPRESSION: 1. No pulmonary embolism. No acute intrathoracic pathology identified. 2. Mild global cardiomegaly with reflux of contrast into the hepatic venous system in keeping with at least some degree of right heart failure. 3. Large hiatal hernia with the majority of the stomach within the thoracic compartment. 4. No acute intra-abdominal pathology identified. No definite radiographic explanation for the patient's reported symptoms. Aortic Atherosclerosis (ICD10-I70.0). Electronically Signed   By: Helyn Numbers M.D.   On: 02/13/2023 19:06   DG Chest Portable 1 View  Result Date: 02/13/2023 CLINICAL DATA:  Shortness of breath EXAM: PORTABLE CHEST 1 VIEW COMPARISON:  02/02/2023 FINDINGS: Mild cardiomegaly. Aortic atherosclerosis. No focal airspace consolidation, pleural effusion, or pneumothorax. IMPRESSION: No active disease. Electronically Signed   By: Duanne Guess D.O.   On: 02/13/2023 16:35    Procedures Procedures    Medications Ordered in ED Medications  sodium chloride 0.9 % bolus 500 mL ( Intravenous Stopped 02/13/23 1814)  iohexol (OMNIPAQUE) 350 MG/ML injection 80 mL (80 mLs Intravenous Contrast Given 02/13/23 1824)   ED Course/ Medical Decision Making/ A&P   Patient here for possible dehydration.  She has had 2 hospitalizations over the last month, prior NSTEMI as well as CHF exacerbation x 2.  She has had progressive increase in her diuretic.  She feels like she is dehydrated  as she has not a significant fluid restriction.  She has no PND, orthopnea.  She has some mild tenderness at her right lower rib border, no nausea or vomiting.  No fever.  She denies any shortness of breath or any overt chest pain.  Her home health nurse noted she had a lower than normal blood pressure however patient states her blood pressures in general are only in the 90s systolic at baseline.  Will plan  broad workup and reassess  Labs and imaging personally viewed and interpreted:  CBC without leukocytosis Metabolic panel sodium 133, potassium 5.5, creatinine 1.68, mild LFT elevation BNP 2417.8--- significant downtrend from prior UA negative for infection Troponin 34---35 downtrend from prior Lactic 2.2 Chest x-ray without significant abnormality CTA and CT AP without significant abnormality EKG without ischemic changes  Patient reassessed.  We discussed labs and imaging.  Workup thus far reassuring.  She was given a gentle fluid bolus given her known CHF with a EF of 30.  With regards to her blood pressures they are in the 90s systolic here.  Thorough chart review this appears to be her baseline throughout her PCP, outpatient appointments as well as her recent hospitalization.  We did get blood cultures however she has no obvious infectious symptoms.  She is ambulatory here without any hypoxia.  With regards to her hyperkalemia she has no EKG changes.  She was given IV fluids.  Will have her hold her nighttime dose of potassium when she is on 80 mg daily and have her follow-up for repeat labs in 3 days  Encourage close follow-up outpatient, return for new or worsening symptoms  Patient seen eval by attending, Dr. Clarice Pole who agrees with above treatment, plan and disposition                               Medical Decision Making Amount and/or Complexity of Data Reviewed Independent Historian: spouse External Data Reviewed: labs, radiology, ECG and notes. Labs: ordered. Decision-making  details documented in ED Course. Radiology: ordered and independent interpretation performed. Decision-making details documented in ED Course. ECG/medicine tests: ordered and independent interpretation performed. Decision-making details documented in ED Course.  Risk OTC drugs. Prescription drug management. Parenteral controlled substances. Decision regarding hospitalization. Diagnosis or treatment significantly limited by social determinants of health.         Final Clinical Impression(s) / ED Diagnoses Final diagnoses:  Hyperkalemia  Dehydration  History of chronic CHF    Rx / DC Orders ED Discharge Orders     None         Armetta Henri A, PA-C 02/13/23 2349    Arby Barrette, MD 02/14/23 1549

## 2023-02-13 NOTE — Discharge Instructions (Addendum)
It was a pleasure taking care of you here in the emergency department  I would hold your potassium supplementation in the evening over the next 3-4 days.  CT scans today did not show any significant abnormality  Please follow-up with your care provider and cardiologist  Hold your nighttime dose today of your torsemide  Return for new or worsening symptoms

## 2023-02-13 NOTE — ED Notes (Signed)
Fall risk armband Fall risk socks Fall risk sign on door

## 2023-02-13 NOTE — ED Provider Notes (Signed)
I provided a substantive portion of the care of this patient.  I personally made/approved the management plan for this patient and take responsibility for the patient management.  EKG Interpretation Date/Time:  Saturday February 13 2023 16:11:17 EDT Ventricular Rate:  113 PR Interval:  178 QRS Duration:  103 QT Interval:  327 QTC Calculation: 449 R Axis:   -77  Text Interpretation: duplicate Confirmed by Arby Barrette [phone]) on 02/13/2023 4:38:15 PM   Patient recent admission diuresis for CHF.  She reports she started to get more fatigued yesterday.  She reports her home health nurse thought that she was getting dehydrated.  Patient has been on fluid restriction and taking Lasix twice daily.  Patient is alert.  No acute respiratory distress.  Speaking clearly in full sentences all movements coordinated purposeful symmetric.  Mental status is good.  No peripheral edema.  Calves are soft and nontender.  At this time patient CHF appears to be at baseline and managed.  She is recently diuresed and on fluid restriction.  I do suspect some mild intravascular depletion contributing to the patient's symptoms.  She has been lightly rehydrated and evaluated for a report of thoracic flank pain CT scan negative for PE, no significant vascular congestion identified on PE study and no acute pneumonias.  Patient's troponin is trending down from an elevated troponin during hospitalization.  This time I do feel she is stable for discharge.  We reviewed return precautions and follow-up plan.   Arby Barrette, MD 02/13/23 2101

## 2023-02-13 NOTE — ED Triage Notes (Signed)
Pt states recent admission for CHF exacerbation, states started getting fatigued yesterday  Stated right sided flank pain that started today  Concern for dehydration from the diuretics

## 2023-02-13 NOTE — ED Notes (Signed)
Attempt at IV x 1 with no result

## 2023-02-14 ENCOUNTER — Telehealth: Payer: Self-pay | Admitting: Cardiology

## 2023-02-14 NOTE — Telephone Encounter (Signed)
Outpatient service line: CC: Dehydration, fatigue  Patient recently seen by [facility] for CHF exacerbation.  She recently went to the [facility] emergency room yesterday complaining of fatigue and dehydration.  Her visit note and lab values of worsening creatinine with elevated potassium suggest likely dehydration.  They had stopped her nightly torsemide and potassium dose with instructions to follow-up with primary care provider tomorrow Monday, 02/15/2023 to recheck labs and to assess diuretic dose.  I think this is reasonable.  She will also follow-up with advanced [facility] in 1 week.  She does not have any other significant symptoms such as increasing peripheral edema or shortness of breath.  I instructed patient that we would just have to see where her potassium and renal function were tomorrow to further titrate diuretic dose as needed.  Will send to primary cardiologist for any further recommendations.

## 2023-02-15 ENCOUNTER — Encounter: Payer: Self-pay | Admitting: Cardiology

## 2023-02-15 ENCOUNTER — Telehealth: Payer: Self-pay | Admitting: Cardiology

## 2023-02-15 NOTE — Telephone Encounter (Signed)
Pt c/o medication issue:  1. Name of Medication: dapagliflozin propanediol (FARXIGA) 10 MG TABS tablet  torsemide (DEMADEX) 20 MG tablet   potassium chloride SA (KLOR-CON M) 20 MEQ tablet   2. How are you currently taking this medication (dosage and times per day)? As Written  3. Are you having a reaction (difficulty breathing--STAT)? No  4. What is your medication issue? Patient's husband is calling because the patient is extremely fatigue. Patient's husband stated she went the emergency room on 11/02. Patient's husband stated the patient was dehydrated. They were recommended by PCP to cut the Farxiga in half. Review phone encounter from [facility] and ED notes in regards to this. Please advise.

## 2023-02-16 ENCOUNTER — Telehealth: Payer: Self-pay

## 2023-02-16 NOTE — Telephone Encounter (Signed)
Pt's husband Minerva Areola is on the line f/u on them getting a callback. He stated they've been waiting to speak with someone since yesterday and sent a MyChart message. Please advise.

## 2023-02-16 NOTE — Telephone Encounter (Signed)
Husband called to follow-up on next steps with patient's torsemide medication and her dehydration.

## 2023-02-16 NOTE — Telephone Encounter (Signed)
Spoke with pt and spouse. She was in the ED for dehydration. They were questioning the dose of her Torsemide and [place]. Per Dr. Bing Matter take 20mg  of Torsemide, 1/2 her dose of potassium and 1/2 dose of Farxiga, BMP this week and daily weight checks. Spouse will send messages with pts progress daily. They both verbalized understanding and had no further questions.

## 2023-02-17 ENCOUNTER — Telehealth: Payer: Self-pay

## 2023-02-17 NOTE — Telephone Encounter (Signed)
Pt called today for an update on how she is feeling. She reported taking the Torsemide 20mg  and Farxiga 10mg  today, she gained 1 pound and feels lethargic and feels short of breath when lying down. Dr. Bing Matter recommended that she come in for an appt. Spoke with pt she will come tomorrow to the [place] office to be seen. Sent to front desk for appt.

## 2023-02-18 ENCOUNTER — Ambulatory Visit: Payer: Medicare HMO | Attending: Cardiology | Admitting: Cardiology

## 2023-02-18 ENCOUNTER — Encounter: Payer: Self-pay | Admitting: Cardiology

## 2023-02-18 ENCOUNTER — Telehealth: Payer: Self-pay | Admitting: Student

## 2023-02-18 VITALS — BP 90/72 | HR 60 | Ht 59.0 in | Wt 114.0 lb

## 2023-02-18 DIAGNOSIS — T451X5A Adverse effect of antineoplastic and immunosuppressive drugs, initial encounter: Secondary | ICD-10-CM

## 2023-02-18 DIAGNOSIS — N1831 Chronic kidney disease, stage 3a: Secondary | ICD-10-CM | POA: Diagnosis not present

## 2023-02-18 DIAGNOSIS — E7800 Pure hypercholesterolemia, unspecified: Secondary | ICD-10-CM

## 2023-02-18 DIAGNOSIS — T451X5D Adverse effect of antineoplastic and immunosuppressive drugs, subsequent encounter: Secondary | ICD-10-CM

## 2023-02-18 DIAGNOSIS — I427 Cardiomyopathy due to drug and external agent: Secondary | ICD-10-CM

## 2023-02-18 DIAGNOSIS — I10 Essential (primary) hypertension: Secondary | ICD-10-CM

## 2023-02-18 DIAGNOSIS — R0609 Other forms of dyspnea: Secondary | ICD-10-CM

## 2023-02-18 MED ORDER — ALPRAZOLAM 0.25 MG PO TABS: 0.2500 mg | ORAL_TABLET | Freq: Three times a day (TID) | ORAL | 0 refills | Status: DC | PRN

## 2023-02-18 NOTE — Addendum Note (Signed)
Addended by: Baldo Ash D on: 02/18/2023 02:55 PM   Modules accepted: Orders

## 2023-02-18 NOTE — Progress Notes (Signed)
Cardiology Office Note:    Date:  02/18/2023   ID:  Nichole Johnson, DOB 1945/07/29, MRN [id number]  PCP:  [name], MD  Cardiologist:  Gypsy Balsam, MD    Referring MD: [name], MD   No chief complaint on file. Not doing well  History of Present Illness:    Nichole Johnson is a 77 y.o. female  With very complex past medical history.  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  Recently she ended up traveling to [place] she got sick with COVID over there come back after that she was doing quite well however started having problems eventually end up being in the hospital with decompensated congestive heart failure.  Echocardiogram to confirm again presence of severely reduced left ventricle ejection fraction 25%.  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  Since that time she has been struggling.  She fluctuated between overloaded with fluid and dehydration.  Recent visit to the emergency room because of dehydration.  She requested to be seen urgently which I am doing today.  She said she is feeling weak tired exhausted in the morning she was fine eating some doing well and then she takes medication and start weaning lousy.  Yesterday evening she started feeling short of breath and her husband gave her extra 20 mg torsemide after that she felt better but then she went to sleep in the morning QUITE nice.  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   Dilated cardiomyopathy (HCC) 04/10/2018   Dyspepsia 01/18/2017   Dysplastic nevus 12/02/2016   Glaucoma 10/14/2017   Hiatal hernia 07/20/2019   High cholesterol    History of breast cancer 04/23/2020   Hyperlipidemia 04/18/2014   Hypertension    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 Pt was given copy of recent ECHO report.  Last Assessment & Plan:  Pt will f/u w/ Cardiology re this. Pt was given copy of recent ECHO report.    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 Added automatically from request for surgery [id number] Formatting of this note might be different from the original. Added automatically from request for surgery [id number]   Osteopenia of hip 04/21/2020   Formatting of this note might be different from the original. DEXA 06/2017: Lumbar T score -1.4, right total femur -1.4. She restarted alendronate since 05/2019. Formatting of this note might be different from the original. DEXA 06/2017: Lumbar T score -1.4, right  total femur -1.4. She restarted alendronate since 05/2019.   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 Start using Flonase  daily. Stop using OTC Afrin Formatting of this note might be different from the original. Last Assessment & Plan: Start using Flonase daily. Stop using OTC Afrin Formatting of this note might be different from the original. Overview: Last Assessment & Plan: Start using Flonase daily. Stop usin   Stage 3a chronic kidney disease (HCC) 04/01/2021   Trigger finger of left hand 07/26/2018    Past Surgical History:  Procedure Laterality Date   BREAST LUMPECTOMY Left 2001   CESAREAN SECTION     RIGHT/LEFT HEART CATH AND CORONARY ANGIOGRAPHY N/A 01/25/2023   Procedure: RIGHT/LEFT HEART CATH AND CORONARY ANGIOGRAPHY;  Surgeon: Yates Decamp, MD;  Location: [facility];  Service: Cardiovascular;  Laterality: N/A;   ROTATOR CUFF REPAIR Bilateral    SHOULDER CLOSED REDUCTION      Current Medications: Current Meds  Medication Sig   albuterol (PROVENTIL HFA;VENTOLIN HFA) 108 (90 Base) MCG/ACT inhaler Inhale 2 puffs into the lungs every 6 (six) hours as needed for wheezing or shortness of breath.   alendronate (FOSAMAX) 35 MG tablet Take 35 mg by mouth once a week.   apixaban (ELIQUIS) 5 MG TABS tablet Take 1 tablet (5 mg total) by mouth 2 (two) times daily.   ARNUITY ELLIPTA 100 MCG/ACT AEPB Inhale 1 puff into the lungs daily.   atorvastatin (LIPITOR) 40 MG tablet Take 1 tablet (40 mg total) by mouth daily.   Biotin 5000 MCG TABS Take 1 tablet by mouth daily.   brinzolamide (AZOPT) 1 % ophthalmic suspension Place 1 drop into both eyes 2 (two) times daily.   Calcium Carbonate-Vitamin D (CALCIUM + D PO) Take 1 tablet by mouth 2 (two) times daily.    Cholecalciferol 25 MCG (1000 UT) tablet Take 1,000 Units by mouth daily.    dapagliflozin propanediol (FARXIGA) 10 MG TABS tablet Take 1 tablet (10 mg total) by mouth daily.   ferrous sulfate 325 (65 FE) MG tablet Take 325 mg by mouth daily.   fluticasone (FLONASE) 50 MCG/ACT nasal spray Place 2 sprays into both nostrils daily. (Patient taking  differently: Place 2 sprays into both nostrils daily as needed for allergies or rhinitis.)   Hypromellose (GENTEAL OP) Place 1 drop into both eyes in the morning, at noon, in the evening, and at bedtime.   latanoprost (XALATAN) 0.005 % ophthalmic solution Place 1 drop into both eyes at bedtime.   levothyroxine (SYNTHROID, LEVOTHROID) 100 MCG tablet Take 100 mcg by mouth daily.   Loratadine 10 MG CAPS Take 10 mg by mouth daily.   metoprolol succinate (TOPROL-XL) 25 MG 24 hr tablet Take 1 tablet (25 mg total) by mouth daily.   Multiple Vitamins-Minerals (CENTRUM SILVER PO) Take 1 tablet by mouth every morning.   nitroGLYCERIN (NITROSTAT) 0.4 MG SL tablet Place 1 tablet (0.4 mg total) under the tongue every 5 (five) minutes as needed for chest pain (up to 3 doses).   potassium chloride SA (KLOR-CON M) 20 MEQ tablet Take 2 tablets (40 mEq total) by mouth 2 (two) times daily.   torsemide (DEMADEX) 20 MG tablet Take 2 tablets (40 mg total) by mouth every morning AND 1  tablet (20 mg total) daily at 6 PM.   torsemide (DEMADEX) 20 MG tablet Take 1 tablet (20 mg total) by mouth daily as needed (Take additional 20mg  daily (in addition to 40mg  AM dose) for shortness of breath/weight gain (total dose 60mg )).   Travoprost, BAK Free, (TRAVATAN) 0.004 % SOLN ophthalmic solution Place 1 drop into both eyes at bedtime.   vitamin B-12 (CYANOCOBALAMIN) 1000 MCG tablet Take 1,000 mcg by mouth daily.   vitamin C (ASCORBIC ACID) 500 MG tablet Take 500 mg by mouth daily.     Allergies:   Penicillins, Sulfamethoxazole-trimethoprim, and Tramadol   Social History   Socioeconomic History   Marital status: Married    Spouse name: Not on file   Number of children: Not on file   Years of education: Not on file   Highest education level: Not on file  Occupational History   Not on file  Tobacco Use   Smoking status: Never    Passive exposure: Never   Smokeless tobacco: Never   Tobacco comments:    NEVER USED TOBACCO   Vaping Use   Vaping status: Never Used  Substance and Sexual Activity   Alcohol use: Yes    Alcohol/week: 0.0 standard drinks of alcohol    Comment: occ   Drug use: No   Sexual activity: Not on file  Other Topics Concern   Not on file  Social History Narrative   Not on file   Social Determinants of Health   Financial Resource Strain: Not on file  Food Insecurity: Low Risk  (02/10/2023)   Received from [facility]   Hunger Vital Sign    Worried About Running Out of Food in the Last Year: Never true    Ran Out of Food in the Last Year: Never true  Transportation Needs: No Transportation Needs (02/10/2023)   Received from [facility]    In the past 12 months, has lack of reliable transportation kept you from medical appointments, meetings, work or from getting things needed for daily living? : No  Physical Activity: Not on file  Stress: Not on file  Social Connections: Unknown (06/27/2022)   Received from [facility]   Social Network    Social Network: Not on file     Family History: The patient's family history includes Hypertension in her mother. There is no history of Heart attack, Stroke, or Diabetes. ROS:   Please see the history of present illness.    All 14 point review of systems negative except as described per history of present illness  EKGs/Labs/Other Studies Reviewed:    EKG Interpretation Date/Time:  Thursday February 18 2023 14:05:31 EST Ventricular Rate:  97 PR Interval:  174 QRS Duration:  100 QT Interval:  358 QTC Calculation: 454 R Axis:   -76  Text Interpretation: Normal sinus rhythm Left axis deviation Inferior infarct , age undetermined Anterolateral infarct , age undetermined Abnormal ECG When compared with ECG of 13-Feb-2023 16:11, PREVIOUS ECG IS PRESENT Confirmed by Gypsy Balsam [id number]) on 02/18/2023 2:07:51 PM    Recent Labs: 09/08/2022: TSH 2.660 01/18/2023: NT-Pro BNP 9,910 02/09/2023: Magnesium  2.6 02/13/2023: ALT 98; B Natriuretic Peptide 2,417.8; BUN 55; Creatinine, Ser 1.68; Hemoglobin 14.0; Platelets 363; Potassium 5.5; Sodium 133  Recent Lipid Panel    Component Value Date/Time   CHOL 129 01/23/2023 0631   CHOL 168 02/25/2022 1144   TRIG 69 01/23/2023 0631   HDL 57 01/23/2023 0631   HDL 65  02/25/2022 1144   CHOLHDL 2.3 01/23/2023 0631   VLDL 14 01/23/2023 0631   LDLCALC 58 01/23/2023 0631   LDLCALC 84 02/25/2022 1144    Physical Exam:    VS:  BP 90/72 (BP Location: Left Arm, Patient Position: Sitting)   Pulse 60   Ht 4\' 11"  (1.499 m)   Wt 114 lb (51.7 kg)   SpO2 95%   BMI 23.03 kg/m     Wt Readings from Last 3 Encounters:  02/18/23 114 lb (51.7 kg)  02/13/23 115 lb 4.8 oz (52.3 kg)  02/09/23 115 lb 4.8 oz (52.3 kg)     GEN:  Well nourished, well developed in no acute distress HEENT: Normal NECK: JVD elevated even when she is sitting upright; No carotid bruits LYMPHATICS: No lymphadenopathy CARDIAC: RRR, holosystolic murmur 1 of 6, no rubs, no gallops RESPIRATORY:  Clear to auscultation without rales, wheezing or rhonchi  ABDOMEN: Soft, non-tender, non-distended MUSCULOSKELETAL:  No edema; No deformity  SKIN: Warm and dry LOWER EXTREMITIES: no swelling NEUROLOGIC:  Alert and oriented x 3 PSYCHIATRIC:  Normal affect   ASSESSMENT:    1. Benign hypertension   2. 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 Stage 3a chronic kidney disease (HCC)   4. High cholesterol    PLAN:    In order of problems listed above:  Very complex situation lady with severe cardiomyopathy I think today in spite of how she gets IVDshe is little on the dry side her blood pressure is low.  I will check Chem-7 proBNP today as well as magnesium level.  I asked him simply to hold off with torsemide altogether.  And see if she feels any better.  Asked her husband to check her weight every single day in the morning and then decide if she needs to take family grams of torsemide or  not at all.  She is scheduled to see Dr. Karlton Lemon at [facility] which can be very beneficial to her clearly reaching the point that more advanced management would be beneficial.  She may require mitral valve clip, she may benefit from M[place], overall management became very difficult. Thrombosis of the obtuse marginal 3.  She is anticoagulated. 3    I spent some time talking to them tried to educate her about the condition but overall I am afraid we reached the point that option would be very limited  Medication Adjustments/Labs and Tests Ordered: Current medicines are reviewed at length with the patient today.  Concerns regarding medicines are outlined above.  Orders Placed This Encounter  Procedures   EKG 12-Lead   Medication changes: No orders of the defined types were placed in this encounter.   Signed, Georgeanna Lea, MD, [facility] 02/18/2023 2:36 PM    [facility]

## 2023-02-18 NOTE — Telephone Encounter (Signed)
   Patient's husband called Answering Service with concerns about medications. Called and spoke with husband (okay per DPR). Husband states patient saw Dr. Bing Matter today and he prescribed Xanax but Pharmacy has not received this yet. Reviewed Dr. Vanetta Shawl note but I don't see any mention of Xanax in his office visit note. When I look under the medication tab, it does look like a prescription of Xanax 0.25mg  every 8 hours PRN for anxiety was placed today but I do not see a Pharmacy attached to this order. Husband states patient is currently resting and is not anxious. Given I have never seen this patient and I do not see anything specifically in Dr. Vanetta Shawl note, I do not feel comfortable prescribing this over the phone. Patient is also currently resting and not having any anxiety. Therefore, I will route this message to Dr. Bing Matter so that his team can follow-up on this tomorrow.  Corrin Parker, PA-C 02/18/2023 6:29 PM

## 2023-02-18 NOTE — Patient Instructions (Addendum)
Medication Instructions:   Stop: Torsemide  Weigh Daily  If weight goes up 2-3 lbs give Torsemide 10mg  if that is not enough no more than 20mg  a day for now.  STOP: Potassium if not taking Torsemide   Lab Work: BMP, ProBNP- today If you have labs (blood work) drawn today and your tests are completely normal, you will receive your results only by: MyChart Message (if you have MyChart) OR A paper copy in the mail If you have any lab test that is abnormal or we need to change your treatment, we will call you to review the results.   Testing/Procedures: None Ordered   Follow-Up: At [facility], you and your health needs are our priority.  As part of our continuing mission to provide you with exceptional heart care, we have created designated Provider Care Teams.  These Care Teams include your primary Cardiologist (physician) and Advanced Practice Providers (APPs -  Physician Assistants and Nurse Practitioners) who all work together to provide you with the care you need, when you need it.  We recommend signing up for the patient portal called "MyChart".  Sign up information is provided on this After Visit Summary.  MyChart is used to connect with patients for Virtual Visits (Telemedicine).  Patients are able to view lab/test results, encounter notes, upcoming appointments, etc.  Non-urgent messages can be sent to your provider as well.   To learn more about what you can do with MyChart, go to [email or web address].    Your next appointment:   3 month(s)  The format for your next appointment:   In Person  Provider:   Gypsy Balsam, MD    Other Instructions NA

## 2023-02-18 NOTE — Addendum Note (Signed)
Addended by: Baldo Ash D on: 02/18/2023 02:58 PM   Modules accepted: Orders

## 2023-02-19 ENCOUNTER — Other Ambulatory Visit: Payer: Self-pay

## 2023-02-19 ENCOUNTER — Telehealth: Payer: Self-pay

## 2023-02-19 ENCOUNTER — Emergency Department (HOSPITAL_BASED_OUTPATIENT_CLINIC_OR_DEPARTMENT_OTHER)
Admission: EM | Admit: 2023-02-19 | Discharge: 2023-02-19 | Disposition: A | Discharge status: Home / Self Care | Payer: Medicare HMO | Attending: Emergency Medicine | Admitting: Emergency Medicine

## 2023-02-19 ENCOUNTER — Encounter (HOSPITAL_BASED_OUTPATIENT_CLINIC_OR_DEPARTMENT_OTHER): Payer: Self-pay

## 2023-02-19 DIAGNOSIS — I5023 Acute on chronic systolic (congestive) heart failure: Secondary | ICD-10-CM | POA: Insufficient documentation

## 2023-02-19 DIAGNOSIS — N1831 Chronic kidney disease, stage 3a: Secondary | ICD-10-CM | POA: Insufficient documentation

## 2023-02-19 DIAGNOSIS — I2510 Atherosclerotic heart disease of native coronary artery without angina pectoris: Secondary | ICD-10-CM | POA: Insufficient documentation

## 2023-02-19 DIAGNOSIS — J45909 Unspecified asthma, uncomplicated: Secondary | ICD-10-CM | POA: Insufficient documentation

## 2023-02-19 DIAGNOSIS — E860 Dehydration: Secondary | ICD-10-CM | POA: Insufficient documentation

## 2023-02-19 DIAGNOSIS — Z79899 Other long term (current) drug therapy: Secondary | ICD-10-CM | POA: Diagnosis not present

## 2023-02-19 DIAGNOSIS — N179 Acute kidney failure, unspecified: Secondary | ICD-10-CM | POA: Insufficient documentation

## 2023-02-19 DIAGNOSIS — Z7951 Long term (current) use of inhaled steroids: Secondary | ICD-10-CM | POA: Insufficient documentation

## 2023-02-19 DIAGNOSIS — Z853 Personal history of malignant neoplasm of breast: Secondary | ICD-10-CM | POA: Insufficient documentation

## 2023-02-19 DIAGNOSIS — Z96651 Presence of right artificial knee joint: Secondary | ICD-10-CM | POA: Diagnosis not present

## 2023-02-19 DIAGNOSIS — E039 Hypothyroidism, unspecified: Secondary | ICD-10-CM | POA: Insufficient documentation

## 2023-02-19 DIAGNOSIS — R5383 Other fatigue: Secondary | ICD-10-CM | POA: Diagnosis present

## 2023-02-19 DIAGNOSIS — I130 Hypertensive heart and chronic kidney disease with heart failure and stage 1 through stage 4 chronic kidney disease, or unspecified chronic kidney disease: Secondary | ICD-10-CM | POA: Diagnosis not present

## 2023-02-19 DIAGNOSIS — Z7901 Long term (current) use of anticoagulants: Secondary | ICD-10-CM | POA: Diagnosis not present

## 2023-02-19 LAB — COMPREHENSIVE METABOLIC PANEL
ALT: 109 U/L — ABNORMAL HIGH (ref 0–44)
AST: 66 U/L — ABNORMAL HIGH (ref 15–41)
Albumin: 3.6 g/dL (ref 3.5–5.0)
Alkaline Phosphatase: 106 U/L (ref 38–126)
Anion gap: 12 (ref 5–15)
BUN: 54 mg/dL — ABNORMAL HIGH (ref 8–23)
CO2: 21 mmol/L — ABNORMAL LOW (ref 22–32)
Calcium: 9.2 mg/dL (ref 8.9–10.3)
Chloride: 96 mmol/L — ABNORMAL LOW (ref 98–111)
Creatinine, Ser: 1.82 mg/dL — ABNORMAL HIGH (ref 0.44–1.00)
GFR, Estimated: 28 mL/min — ABNORMAL LOW (ref >60)
Glucose, Bld: 126 mg/dL — ABNORMAL HIGH (ref 70–99)
Potassium: 5 mmol/L (ref 3.5–5.1)
Sodium: 129 mmol/L — ABNORMAL LOW (ref 135–145)
Total Bilirubin: 1.5 mg/dL — ABNORMAL HIGH (ref <1.2)
Total Protein: 6.7 g/dL (ref 6.5–8.1)

## 2023-02-19 LAB — CBC WITH DIFFERENTIAL/PLATELET
Abs Immature Granulocytes: 0.01 10*3/uL (ref 0.00–0.07)
Basophils Absolute: 0.1 10*3/uL (ref 0.0–0.1)
Basophils Relative: 1 %
Eosinophils Absolute: 0.2 10*3/uL (ref 0.0–0.5)
Eosinophils Relative: 3 %
HCT: 41.4 % (ref 36.0–46.0)
Hemoglobin: 13.5 g/dL (ref 12.0–15.0)
Immature Granulocytes: 0 %
Lymphocytes Relative: 25 %
Lymphs Abs: 1.8 10*3/uL (ref 0.7–4.0)
MCH: 26.4 pg (ref 26.0–34.0)
MCHC: 32.6 g/dL (ref 30.0–36.0)
MCV: 81 fL (ref 80.0–100.0)
Monocytes Absolute: 0.5 10*3/uL (ref 0.1–1.0)
Monocytes Relative: 8 %
Neutro Abs: 4.5 10*3/uL (ref 1.7–7.7)
Neutrophils Relative %: 63 %
Platelets: 318 10*3/uL (ref 150–400)
RBC: 5.11 MIL/uL (ref 3.87–5.11)
RDW: 15.8 % — ABNORMAL HIGH (ref 11.5–15.5)
WBC: 7.1 10*3/uL (ref 4.0–10.5)
nRBC: 0 % (ref 0.0–0.2)

## 2023-02-19 LAB — BASIC METABOLIC PANEL
Anion gap: 12 (ref 5–15)
BUN/Creatinine Ratio: 25 (ref 12–28)
BUN: 47 mg/dL — ABNORMAL HIGH (ref 8–27)
BUN: 53 mg/dL — ABNORMAL HIGH (ref 8–23)
CO2: 20 mmol/L (ref 20–29)
CO2: 21 mmol/L — ABNORMAL LOW (ref 22–32)
Calcium: 9.9 mg/dL (ref 8.7–10.3)
Calcium: 9.1 mg/dL (ref 8.9–10.3)
Chloride: 93 mmol/L — ABNORMAL LOW (ref 96–106)
Chloride: 96 mmol/L — ABNORMAL LOW (ref 98–111)
Creatinine, Ser: 1.85 mg/dL — ABNORMAL HIGH (ref 0.57–1.00)
Creatinine, Ser: 1.78 mg/dL — ABNORMAL HIGH (ref 0.44–1.00)
GFR, Estimated: 29 mL/min — ABNORMAL LOW (ref >60)
Glucose, Bld: 103 mg/dL — ABNORMAL HIGH (ref 70–99)
Glucose: 125 mg/dL — ABNORMAL HIGH (ref 70–99)
Potassium: 5.9 mmol/L (ref 3.5–5.2)
Potassium: 4.5 mmol/L (ref 3.5–5.1)
Sodium: 131 mmol/L — ABNORMAL LOW (ref 134–144)
Sodium: 129 mmol/L — ABNORMAL LOW (ref 135–145)
eGFR: 27 mL/min/{1.73_m2} — ABNORMAL LOW (ref >59)

## 2023-02-19 LAB — BRAIN NATRIURETIC PEPTIDE: B Natriuretic Peptide: 3124.7 pg/mL — ABNORMAL HIGH (ref 0.0–100.0)

## 2023-02-19 LAB — CULTURE, BLOOD (ROUTINE X 2)
Culture: NO GROWTH
Culture: NO GROWTH
Special Requests: ADEQUATE
Special Requests: ADEQUATE

## 2023-02-19 LAB — PRO B NATRIURETIC PEPTIDE: NT-Pro BNP: 24266 pg/mL — ABNORMAL HIGH (ref 0–738)

## 2023-02-19 LAB — MAGNESIUM: Magnesium: 2.6 mg/dL — ABNORMAL HIGH (ref 1.7–2.4)

## 2023-02-19 MED ORDER — SODIUM CHLORIDE 0.9 % IV BOLUS
500.0000 mL | Freq: Once | INTRAVENOUS | Status: AC
Start: 2023-02-19 — End: 2023-02-19
  Administered 2023-02-19: 500 mL via INTRAVENOUS

## 2023-02-19 MED ORDER — ACETAMINOPHEN 500 MG PO TABS
1000.0000 mg | ORAL_TABLET | Freq: Once | ORAL | Status: AC
  Administered 2023-02-19: 1000 mg via ORAL
  Filled 2023-02-19: qty 2

## 2023-02-19 NOTE — ED Notes (Signed)
Reviewed discharge instructions with pt and husband. Per EDP no more than a cup extra of water unless she starts gaining weight. Pt states understanding. Assisted via transport chair at time of discharge

## 2023-02-19 NOTE — Discharge Instructions (Addendum)
We evaluated you for your abnormal lab test.  Your potassium level was normal when we checked in the emergency department.  Your kidney function remains slightly elevated.  I think your symptoms are most likely due to dehydration.  Please call your cardiology office for repeat laboratory testing on Monday.  I would continue to hold your torsemide unless you have weight gain. You can slightly increase your fluid intake.   If you develop any worsening symptoms such as weakness, nausea or vomiting, fevers or chills, chest pain, shortness of breath, lightheadedness, dizziness, or any other new or concerning symptoms, please return to the emergency department.

## 2023-02-19 NOTE — Telephone Encounter (Signed)
Spoke to spouse and Santina Evans at pharmacy. Called in Xanax .25 #15 1 q 8 PRN anxiety 0 ref to [facility] [place].

## 2023-02-19 NOTE — Telephone Encounter (Signed)
Pt's husband is calling stating he is returning call in regards to medication needed. Requesting call back.

## 2023-02-19 NOTE — Telephone Encounter (Signed)
Spoke with husband Minerva Areola and informed that we are waiting on a response from Dr. Bing Matter as to what is the next step. Husband voiced understanding.

## 2023-02-19 NOTE — ED Provider Notes (Signed)
[place] [facility] Provider Note  CSN: [id number] Arrival date & time: 02/19/23 1041  Chief Complaint(s) abnormal labs  HPI Nichole Johnson is a 77 y.o. female history of CHF, hypertension, hyperlipidemia coronary artery disease presenting to the emergency department due to abnormal laboratory test.  Patient has been in and out of the hospital for various cardiac issues including NSTEMI with medical management, acute CHF exacerbation, and most recently ER visit for dehydration in setting of possible overdiuresis.  She followed with her cardiologist yesterday after most recent ER visit and laboratory testing were checked.  He had advised him to stop giving additional torsemide as patient was euvolemic or a little dry, keep an eye on the weight.  Patient reports today just general fatigue.  No significant shortness of breath, nausea or vomiting, leg swelling, abdominal pain.  Has not been taking torsemide or supplemental potassium.  Labs are notable for AKI, hyperkalemia, she was advised to come in for further evaluation.   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   Dilated cardiomyopathy (HCC) 04/10/2018   Dyspepsia 01/18/2017   Dysplastic nevus 12/02/2016   Glaucoma 10/14/2017   Hiatal hernia 07/20/2019   High cholesterol    History of breast cancer 04/23/2020   Hyperlipidemia 04/18/2014   Hypertension    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 Pt was given copy of recent ECHO report.  Last Assessment & Plan:  Pt will f/u w/ Cardiology re this. Pt was given copy of recent ECHO report.    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 Added automatically from request for surgery [id number] Formatting of this note might be different from the original. Added automatically from request for surgery [id number]   Osteopenia of hip 04/21/2020   Formatting of this note might be different from the original. DEXA 06/2017: Lumbar T score -1.4, right total femur -1.4. She restarted alendronate since 05/2019. Formatting of this note might be different from the original. DEXA 06/2017: Lumbar T score -1.4, right total femur -1.4. She restarted alendronate since 05/2019.   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 Start using Flonase daily. Stop using OTC Afrin Formatting of this note might be different from the original. Last Assessment & Plan: Start using Flonase daily. Stop using OTC Afrin Formatting of this note might be different from the original. Overview: Last Assessment & Plan: Start using  Flonase daily. 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/2020   Hypertension    High cholesterol    Cancer (HCC)    History of breast cancer 04/23/2020   Osteopenia of hip 04/21/2020   Osteoarthritis of left knee 08/17/2019   Hiatal hernia 07/20/2019   Bilateral hand pain 07/26/2018   Trigger finger of left hand 07/26/2018   Dilated cardiomyopathy (HCC) 04/10/2018   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   AKI (acute kidney injury) (HCC) 11/03/2017   Glaucoma 10/14/2017   Reactive airway disease without complication 10/14/2017   S/P total knee arthroplasty, 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   Dyspepsia 01/18/2017   Dysplastic nevus 12/02/2016   Benign hypertension 07/08/2016   Obstructive sleep apnea (adult) (pediatric) 07/08/2016   Acute renal insufficiency 06/09/2016   Hypotension 06/09/2016   Acute on chronic systolic congestive heart failure (HCC) 05/19/2016   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 Authorizing Provider  albuterol (PROVENTIL HFA;VENTOLIN HFA) 108 (90 Base) MCG/ACT inhaler Inhale 2 puffs into the lungs every 6 (six) hours as needed for wheezing or shortness of breath. 01/01/16   Mannam, Colbert Coyer, MD  alendronate (FOSAMAX) 35 MG tablet Take 35 mg by mouth once a week.  01/08/23   [provider]  ALPRAZolam Prudy Feeler) 0.25 MG tablet Take 1 tablet (0.25 mg total) by mouth every 8 (eight) hours as needed for anxiety. 02/18/23   Georgeanna Lea, MD  apixaban (ELIQUIS) 5 MG TABS tablet Take 1 tablet (5 mg total) by mouth 2 (two) times daily. 02/09/23 05/10/23  [place], Eric J, DO  ARNUITY ELLIPTA 100 MCG/ACT AEPB Inhale 1 puff into the lungs daily. 01/29/23 01/29/24  [provider]  atorvastatin (LIPITOR) 40 MG tablet Take 1 tablet (40 mg total) by mouth daily. 02/09/23   [place], Alvira Philips, DO  Biotin 5000 MCG TABS Take 1 tablet by mouth daily.    [provider]  brinzolamide (AZOPT) 1 % ophthalmic suspension Place 1 drop into both eyes 2 (two) times daily.    [provider]  Calcium Carbonate-Vitamin D (CALCIUM + D PO) Take 1 tablet by mouth 2 (two) times daily.     [provider]  Cholecalciferol 25 MCG (1000 UT) tablet Take 1,000 Units by mouth daily.     [provider]  dapagliflozin propanediol (FARXIGA) 10 MG TABS tablet Take 1 tablet (10 mg total) by mouth daily. 01/27/23   Dunn, Tacey Ruiz, PA-C  ferrous sulfate 325 (65 FE) MG tablet Take 325 mg by mouth daily.    [provider]  fluticasone (FLONASE) 50 MCG/ACT nasal spray Place 2  sprays into both nostrils daily. Patient taking differently: Place 2 sprays into both nostrils daily as needed for allergies or rhinitis. 01/01/16   Mannam, Colbert Coyer, MD  Hypromellose (GENTEAL OP) Place 1 drop into both eyes in the morning, at noon, in the evening, and at bedtime.    [provider]  latanoprost (XALATAN) 0.005 % ophthalmic solution Place 1 drop into both eyes at bedtime.    [provider]  levothyroxine (SYNTHROID, LEVOTHROID) 100 MCG tablet Take 100 mcg by mouth daily. 07/20/16   [provider]  Loratadine 10 MG CAPS Take 10 mg by mouth daily.    [provider]  metoprolol succinate (TOPROL-XL) 25 MG 24 hr tablet Take  1 tablet (25 mg total) by mouth daily. 01/26/23   Dunn, Tacey Ruiz, PA-C  Multiple Vitamins-Minerals (CENTRUM SILVER PO) Take 1 tablet by mouth every morning.    [provider]  nitroGLYCERIN (NITROSTAT) 0.4 MG SL tablet Place 1 tablet (0.4 mg total) under the tongue every 5 (five) minutes as needed for chest pain (up to 3 doses). 01/26/23   Dunn, Tacey Ruiz, PA-C  potassium chloride SA (KLOR-CON M) 20 MEQ tablet Take 2 tablets (40 mEq total) by mouth 2 (two) times daily. 02/09/23 05/10/23  [place], Alvira Philips, DO  torsemide (DEMADEX) 20 MG tablet Take 2 tablets (40 mg total) by mouth every morning AND 1 tablet (20 mg total) daily at 6 PM. 02/09/23 05/10/23  [place], Alvira Philips, DO  torsemide (DEMADEX) 20 MG tablet Take 1 tablet (20 mg total) by mouth daily as needed (Take additional 20mg  daily (in addition to 40mg  AM dose) for shortness of breath/weight gain (total dose 60mg )). 02/09/23   [place], Eric J, DO  Travoprost, BAK Free, (TRAVATAN) 0.004 % SOLN ophthalmic solution Place 1 drop into both eyes at bedtime. 02/09/23   [place], Alvira Philips, DO  vitamin B-12 (CYANOCOBALAMIN) 1000 MCG tablet Take 1,000 mcg by mouth daily.    [provider]  vitamin C (ASCORBIC ACID) 500 MG tablet Take 500 mg by mouth daily.    [provider]                                                                                                                                    Past Surgical History Past Surgical History:  Procedure Laterality Date   BREAST LUMPECTOMY Left 2001   CESAREAN SECTION     RIGHT/LEFT HEART CATH AND CORONARY ANGIOGRAPHY N/A 01/25/2023   Procedure: RIGHT/LEFT HEART CATH AND CORONARY ANGIOGRAPHY;  Surgeon: Yates Decamp, MD;  Location: [facility];  Service: Cardiovascular;  Laterality: N/A;   ROTATOR CUFF REPAIR Bilateral    SHOULDER CLOSED REDUCTION     Family History Family History  Problem Relation Age of Onset   Hypertension Mother    Heart attack Neg Hx    Stroke Neg  Hx    Diabetes Neg Hx  Social History Social History   Tobacco Use   Smoking status: Never    Passive exposure: Never   Smokeless tobacco: Never   Tobacco comments:    NEVER USED TOBACCO  Vaping Use   Vaping status: Never Used  Substance Use Topics   Alcohol use: Yes    Alcohol/week: 0.0 standard drinks of alcohol    Comment: occ   Drug use: No   Allergies Penicillins, Sulfamethoxazole-trimethoprim, and Tramadol  Review of Systems Review of Systems  All other systems reviewed and are negative.   Physical Exam Vital Signs  I have reviewed the triage vital signs BP 111/86 (BP Location: Right Arm)   Pulse 88   Temp 98.7 F (37.1 C) (Oral)   Resp 20   Ht 4\' 11"  (1.499 m)   Wt 51.7 kg   SpO2 97%   BMI 23.03 kg/m  Physical Exam Vitals and nursing note reviewed.  Constitutional:      General: She is not in acute distress.    Appearance: She is well-developed.  HENT:     Head: Normocephalic and atraumatic.     Mouth/Throat:     Mouth: Mucous membranes are moist.  Eyes:     Pupils: Pupils are equal, round, and reactive to light.  Cardiovascular:     Rate and Rhythm: Normal rate and regular rhythm.     Heart sounds: No murmur heard. Pulmonary:     Effort: Pulmonary effort is normal. No respiratory distress.     Breath sounds: Normal breath sounds.  Abdominal:     General: Abdomen is flat.     Palpations: Abdomen is soft.     Tenderness: There is no abdominal tenderness.  Musculoskeletal:        General: No tenderness.     Right lower leg: No edema.     Left lower leg: No edema.  Skin:    General: Skin is warm and dry.  Neurological:     General: No focal deficit present.     Mental Status: She is alert. Mental status is at baseline.  Psychiatric:        Mood and Affect: Mood normal.        Behavior: Behavior normal.     ED Results and Treatments Labs (all labs ordered are listed, but only abnormal results are displayed) Labs Reviewed   COMPREHENSIVE METABOLIC PANEL - Abnormal; Notable for the following components:      Result Value   Sodium 129 (*)    Chloride 96 (*)    CO2 21 (*)    Glucose, Bld 126 (*)    BUN 54 (*)    Creatinine, Ser 1.82 (*)    AST 66 (*)    ALT 109 (*)    Total Bilirubin 1.5 (*)    GFR, Estimated 28 (*)    All other components within normal limits  CBC WITH DIFFERENTIAL/PLATELET - Abnormal; Notable for the following components:   RDW 15.8 (*)    All other components within normal limits  BRAIN NATRIURETIC PEPTIDE - Abnormal; Notable for the following components:   B Natriuretic Peptide 3,124.7 (*)    All other components within normal limits  MAGNESIUM - Abnormal; Notable for the following components:   Magnesium 2.6 (*)    All other components within normal limits  BASIC METABOLIC PANEL - Abnormal; Notable for the following components:   Sodium 129 (*)    Chloride 96 (*)    CO2 21 (*)  Glucose, Bld 103 (*)    BUN 53 (*)    Creatinine, Ser 1.78 (*)    GFR, Estimated 29 (*)    All other components within normal limits                                                                                                                          Radiology No results found.  Pertinent labs & imaging results that were available during my care of the patient were reviewed by me and considered in my medical decision making (see MDM for details).  Medications Ordered in ED Medications  sodium chloride 0.9 % bolus 500 mL (0 mLs Intravenous Stopped 02/19/23 1307)  acetaminophen (TYLENOL) tablet 1,000 mg (1,000 mg Oral Given 02/19/23 1307)                                                                                                                                     Procedures Procedures  (including critical care time)  Medical Decision Making / ED Course   MDM:  77 year old presenting to the emergency department with abnormal lab.  Reviewed previous records, cardiology note,  laboratory testing, which showed mild AKI as well as hyperkalemia.  Will recheck today.  Patient does appear euvolemic to a little dry today.  She has no lower extremity swelling and lungs are clear.  This could be ultimate cause of her AKI.  If patient continues to have hyperkalemia AKI would likely benefit from admission given her apparent difficult to manage fluid status.  No EKG changes concerning for hyperkalemia at this time.  Will reassess.    Clinical Course as of 02/19/23 1511  Fri Feb 19, 2023  1434 Labs overall reassuring.  AKI is stable.  Potassium is within normal limits.  LFTs mildly abnormal but stable.  Suspect patient more dehydrated.  She received 500 cc of fluid.  She does report that she feels a little bit better now.  Recheck BMP, if stable or improving anticipate discharge. [WS]  1500 BMP with stable/improving potassium. Creatinine slightly better. Advised to continue to hold torsemide unless experiencing significant weight gain or shortness of breath, slightly increase fluid intake, follow with cardiology for re-check of metabolic panel on Monday. GFR is not significantly different from recent outpatient values. Will discharge patient to home. All questions answered. Patient comfortable with plan of discharge. Return precautions discussed with patient and  specified on the after visit summary.  [WS]    Clinical Course User Index [WS] Lonell Grandchild, MD     Additional history obtained: -Additional history obtained from spouse -External records from outside source obtained and reviewed including: Chart review including previous notes, labs, imaging, consultation notes including previous recent admission and er visit, cardiology notes    Lab Tests: -I ordered, reviewed, and interpreted labs.   The pertinent results include:   Labs Reviewed  COMPREHENSIVE METABOLIC PANEL - Abnormal; Notable for the following components:      Result Value   Sodium 129 (*)    Chloride  96 (*)    CO2 21 (*)    Glucose, Bld 126 (*)    BUN 54 (*)    Creatinine, Ser 1.82 (*)    AST 66 (*)    ALT 109 (*)    Total Bilirubin 1.5 (*)    GFR, Estimated 28 (*)    All other components within normal limits  CBC WITH DIFFERENTIAL/PLATELET - Abnormal; Notable for the following components:   RDW 15.8 (*)    All other components within normal limits  BRAIN NATRIURETIC PEPTIDE - Abnormal; Notable for the following components:   B Natriuretic Peptide 3,124.7 (*)    All other components within normal limits  MAGNESIUM - Abnormal; Notable for the following components:   Magnesium 2.6 (*)    All other components within normal limits  BASIC METABOLIC PANEL - Abnormal; Notable for the following components:   Sodium 129 (*)    Chloride 96 (*)    CO2 21 (*)    Glucose, Bld 103 (*)    BUN 53 (*)    Creatinine, Ser 1.78 (*)    GFR, Estimated 29 (*)    All other components within normal limits    Notable for mild AKI   EKG   EKG Interpretation Date/Time:  Friday February 19 2023 10:58:03 EST Ventricular Rate:  96 PR Interval:  183 QRS Duration:  107 QT Interval:  363 QTC Calculation: 459 R Axis:   -71  Text Interpretation: Sinus rhythm Probable left atrial enlargement Left ventricular hypertrophy Inferior infarct, old Anterior infarct, old Lateral leads are also involved Baseline wander in lead(s) V3 Confirmed by Alvino Blood ([id number]) on 02/19/2023 11:09:26 AM        Medicines ordered and prescription drug management: Meds ordered this encounter  Medications   sodium chloride 0.9 % bolus 500 mL   acetaminophen (TYLENOL) tablet 1,000 mg    -I have reviewed the patients home medicines and have made adjustments as needed  Cardiac Monitoring: The patient was maintained on a cardiac monitor.  I personally viewed and interpreted the cardiac monitored which showed an underlying rhythm of: NSR  Reevaluation: After the interventions noted above, I reevaluated the patient  and found that their symptoms have improved  Co morbidities that complicate the patient evaluation  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   Dilated cardiomyopathy (HCC) 04/10/2018   Dyspepsia 01/18/2017   Dysplastic nevus 12/02/2016   Glaucoma 10/14/2017   Hiatal hernia 07/20/2019  High cholesterol    History of breast cancer 04/23/2020   Hyperlipidemia 04/18/2014   Hypertension    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 Pt was given copy of recent ECHO report.  Last Assessment & Plan:  Pt will f/u w/ Cardiology re this. Pt was given copy of recent ECHO report.    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 Added automatically from request for surgery [id number] Formatting of this note might be different from the original. Added automatically from request for surgery [id number]   Osteopenia of hip 04/21/2020   Formatting of this note might be different from the original. DEXA 06/2017: Lumbar T score -1.4, right total femur -1.4. She restarted alendronate since 05/2019. Formatting of this note might be different from the  original. DEXA 06/2017: Lumbar T score -1.4, right total femur -1.4. She restarted alendronate since 05/2019.   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 Start using Flonase daily. Stop using OTC Afrin Formatting of this note might be different from the original. Last Assessment & Plan: Start using Flonase daily. Stop using OTC Afrin Formatting of this note might be different from the original. Overview: Last Assessment & Plan: Start using Flonase daily. Stop usin   Stage 3a chronic kidney disease (HCC) 04/01/2021   Trigger finger of left hand 07/26/2018      Dispostion: Disposition decision including need for hospitalization was considered, and patient discharged from emergency department.    Final Clinical Impression(s) / ED Diagnoses Final diagnoses:  Dehydration  AKI (acute kidney injury) (HCC)     This chart was dictated using voice recognition software.  Despite best efforts to proofread,  errors can occur which can change the documentation meaning.    Lonell Grandchild, MD 02/19/23 [phone]

## 2023-02-19 NOTE — ED Triage Notes (Signed)
The patient was seen at cardiology yesterday as a follow up. Her lab work was taken. Today the MD called and advised them to come to the ER due to high potassium and her kidney function.

## 2023-02-19 NOTE — Telephone Encounter (Signed)
Spoke with pt's spouse regarding lab results. Advised per Dr. Bing Matter to go to ED to be treated for increased potassium of 5.9. Spouse agreed and verbalized understanding.

## 2023-02-21 ENCOUNTER — Encounter (HOSPITAL_BASED_OUTPATIENT_CLINIC_OR_DEPARTMENT_OTHER): Payer: Self-pay | Admitting: Emergency Medicine

## 2023-02-21 ENCOUNTER — Emergency Department (HOSPITAL_BASED_OUTPATIENT_CLINIC_OR_DEPARTMENT_OTHER): Payer: Medicare HMO

## 2023-02-21 ENCOUNTER — Emergency Department (HOSPITAL_BASED_OUTPATIENT_CLINIC_OR_DEPARTMENT_OTHER)
Admission: EM | Admit: 2023-02-21 | Discharge: 2023-02-21 | Disposition: A | Discharge status: Home / Self Care | Payer: Medicare HMO | Source: Home / Self Care | Attending: Emergency Medicine | Admitting: Emergency Medicine

## 2023-02-21 ENCOUNTER — Other Ambulatory Visit: Payer: Self-pay

## 2023-02-21 DIAGNOSIS — R0602 Shortness of breath: Secondary | ICD-10-CM | POA: Insufficient documentation

## 2023-02-21 DIAGNOSIS — I509 Heart failure, unspecified: Secondary | ICD-10-CM | POA: Diagnosis not present

## 2023-02-21 DIAGNOSIS — Z20822 Contact with and (suspected) exposure to covid-19: Secondary | ICD-10-CM | POA: Insufficient documentation

## 2023-02-21 DIAGNOSIS — I130 Hypertensive heart and chronic kidney disease with heart failure and stage 1 through stage 4 chronic kidney disease, or unspecified chronic kidney disease: Secondary | ICD-10-CM | POA: Diagnosis not present

## 2023-02-21 LAB — CBC WITH DIFFERENTIAL/PLATELET
Abs Immature Granulocytes: 0.04 10*3/uL (ref 0.00–0.07)
Basophils Absolute: 0.1 10*3/uL (ref 0.0–0.1)
Basophils Relative: 1 %
Eosinophils Absolute: 0.2 10*3/uL (ref 0.0–0.5)
Eosinophils Relative: 3 %
HCT: 41.7 % (ref 36.0–46.0)
Hemoglobin: 13.6 g/dL (ref 12.0–15.0)
Immature Granulocytes: 1 %
Lymphocytes Relative: 22 %
Lymphs Abs: 1.6 10*3/uL (ref 0.7–4.0)
MCH: 26.5 pg (ref 26.0–34.0)
MCHC: 32.6 g/dL (ref 30.0–36.0)
MCV: 81.3 fL (ref 80.0–100.0)
Monocytes Absolute: 0.5 10*3/uL (ref 0.1–1.0)
Monocytes Relative: 7 %
Neutro Abs: 4.7 10*3/uL (ref 1.7–7.7)
Neutrophils Relative %: 66 %
Platelets: 286 10*3/uL (ref 150–400)
RBC: 5.13 MIL/uL — ABNORMAL HIGH (ref 3.87–5.11)
RDW: 15.9 % — ABNORMAL HIGH (ref 11.5–15.5)
WBC: 7.1 10*3/uL (ref 4.0–10.5)
nRBC: 0 % (ref 0.0–0.2)

## 2023-02-21 LAB — BASIC METABOLIC PANEL
Anion gap: 15 (ref 5–15)
BUN: 49 mg/dL — ABNORMAL HIGH (ref 8–23)
CO2: 17 mmol/L — ABNORMAL LOW (ref 22–32)
Calcium: 9.6 mg/dL (ref 8.9–10.3)
Chloride: 96 mmol/L — ABNORMAL LOW (ref 98–111)
Creatinine, Ser: 1.61 mg/dL — ABNORMAL HIGH (ref 0.44–1.00)
GFR, Estimated: 32 mL/min — ABNORMAL LOW (ref >60)
Glucose, Bld: 145 mg/dL — ABNORMAL HIGH (ref 70–99)
Potassium: 4.3 mmol/L (ref 3.5–5.1)
Sodium: 128 mmol/L — ABNORMAL LOW (ref 135–145)

## 2023-02-21 LAB — BRAIN NATRIURETIC PEPTIDE: B Natriuretic Peptide: 2950.2 pg/mL — ABNORMAL HIGH (ref 0.0–100.0)

## 2023-02-21 LAB — HEPATIC FUNCTION PANEL
ALT: 125 U/L — ABNORMAL HIGH (ref 0–44)
AST: 81 U/L — ABNORMAL HIGH (ref 15–41)
Albumin: 3.8 g/dL (ref 3.5–5.0)
Alkaline Phosphatase: 191 U/L — ABNORMAL HIGH (ref 38–126)
Bilirubin, Direct: 0.3 mg/dL — ABNORMAL HIGH (ref 0.0–0.2)
Indirect Bilirubin: 1.4 mg/dL — ABNORMAL HIGH (ref 0.3–0.9)
Total Bilirubin: 1.7 mg/dL — ABNORMAL HIGH (ref <1.2)
Total Protein: 6.9 g/dL (ref 6.5–8.1)

## 2023-02-21 LAB — RESP PANEL BY RT-PCR (RSV, FLU A&B, COVID)  RVPGX2
Influenza A by PCR: NEGATIVE
Influenza B by PCR: NEGATIVE
Resp Syncytial Virus by PCR: NEGATIVE
SARS Coronavirus 2 by RT PCR: NEGATIVE

## 2023-02-21 LAB — TROPONIN I (HIGH SENSITIVITY): Troponin I (High Sensitivity): 24 ng/L — ABNORMAL HIGH (ref <18)

## 2023-02-21 NOTE — ED Notes (Signed)
ED Provider at bedside. 

## 2023-02-21 NOTE — ED Triage Notes (Signed)
Pt here for [facility]; seen recently for same, feels it is worse now; no severe distress, but pt looks unwell

## 2023-02-21 NOTE — ED Provider Notes (Signed)
[facility] Provider Note   CSN: [id number] Arrival date & time: 02/21/23  1445     History Chief Complaint  Patient presents with   Shortness of Breath    HPI Nichole Johnson is a 77 y.o. female presenting for episode of shortness of breath this morning lasting approximate 20 minutes.  Completely resolved by time of arrival.. Has been seen very frequently for similar complaints over the last 2 weeks.  Denies fevers chills nausea vomiting syncope shortness of breath at this time. Recent Echo: Left ventricular ejection fraction, by estimation, is 25 to 30%. The  left ventricle has severely decreased function. The left ventricle  demonstrates global hypokinesis. The left ventricular internal cavity size  was dilated. Left ventricular diastolic.  Recent Cath "Patient does not have any significant coronary artery disease"   Patient's recorded medical, surgical, social, medication list and allergies were reviewed in the Snapshot window as part of the initial history.   Review of Systems   Review of Systems  Constitutional:  Negative for chills and fever.  HENT:  Negative for ear pain and sore throat.   Eyes:  Negative for pain and visual disturbance.  Respiratory:  Positive for shortness of breath. Negative for cough.   Cardiovascular:  Negative for chest pain and palpitations.  Gastrointestinal:  Negative for abdominal pain and vomiting.  Genitourinary:  Negative for dysuria and hematuria.  Musculoskeletal:  Negative for arthralgias and back pain.  Skin:  Negative for color change and rash.  Neurological:  Negative for seizures and syncope.  All other systems reviewed and are negative.   Physical Exam Updated Vital Signs BP (!) 134/108   Pulse 88   Temp (!) 97.5 F (36.4 C)   Resp 16   Ht 4\' 11"  (1.499 m)   Wt 51.7 kg   SpO2 97%   BMI 23.03 kg/m  Physical Exam Vitals and nursing note reviewed.  Constitutional:       General: She is not in acute distress.    Appearance: She is well-developed. She is not ill-appearing or toxic-appearing.  HENT:     Head: Normocephalic and atraumatic.  Eyes:     Extraocular Movements: Extraocular movements intact.     Conjunctiva/sclera: Conjunctivae normal.     Pupils: Pupils are equal, round, and reactive to light.  Cardiovascular:     Rate and Rhythm: Normal rate and regular rhythm.     Heart sounds: No murmur heard. Pulmonary:     Effort: Pulmonary effort is normal. No respiratory distress.     Breath sounds: Normal breath sounds.  Abdominal:     General: Abdomen is flat. There is no distension.     Palpations: Abdomen is soft.     Tenderness: There is no abdominal tenderness. There is no right CVA tenderness or left CVA tenderness.  Musculoskeletal:        General: No swelling, tenderness, deformity or signs of injury. Normal range of motion.     Cervical back: Normal range of motion and neck supple. No rigidity.  Skin:    General: Skin is warm and dry.  Neurological:     General: No focal deficit present.     Mental Status: She is alert and oriented to person, place, and time. Mental status is at baseline.     Cranial Nerves: No cranial nerve deficit.  Psychiatric:        Mood and Affect: Mood normal.      ED Course/  Medical Decision Making/ A&P Clinical Course as of 02/21/23 1807  Sun Feb 21, 2023  1502 No acute EKG changes from prior.  Reviewed at this time. [CC]  1656 Episode of SOB this morning. Was doing well.  Was restless and dyspneic per family. [CC]    Clinical Course User Index [CC] Glyn Ade, MD    Procedures Procedures   Medications Ordered in ED Medications - No data to display  Medical Decision Making:    Nichole Johnson is a 77 y.o. female who presented to the ED today with acute on chronic dyspnea detailed above.     Patient placed on continuous vitals and telemetry monitoring while in ED which was reviewed  periodically.   Complete initial physical exam performed, notably the patient  was hemodynamically stable no acute distress currently asymptomatic.      Reviewed and confirmed nursing documentation for past medical history, family history, social history.    Initial Assessment:   With the patient's presentation of dyspnea, most likely diagnosis is chronic heart failure with exertional dyspnea. Other diagnoses were considered including (but not limited to) acute exacerbation, ACS, PE, pneumothorax, pneumonia. These are considered less likely due to history of present illness and physical exam findings.   This is most consistent with an acute life/limb threatening illness complicated by underlying chronic conditions.  Initial Plan:  Screening labs including CBC and Metabolic panel to evaluate for infectious or metabolic etiology of disease.  CXR to evaluate for structural/infectious intrathoracic pathology.  Troponin/BNP/EKG to evaluate for cardiac pathology. Objective evaluation as below reviewed with plan for close reassessment  Initial Study Results:   Laboratory  All laboratory results reviewed without evidence of clinically relevant pathology.   In particular, troponin downtrending, BNP downtrending, GFR increasing.  There is some slight LFT abnormalities that appear acute on chronic and need to be followed up in the outpatient setting  EKG EKG was reviewed independently. Rate, rhythm, axis, intervals all examined and without medically relevant abnormality. ST segments without concerns for elevations.    Radiology  All images reviewed independently. Agree with radiology report at this time.   DG Chest Portable 1 View  Result Date: 02/21/2023 CLINICAL DATA:  Shortness of breath. EXAM: PORTABLE CHEST 1 VIEW COMPARISON:  February 13, 2023 FINDINGS: The cardiac silhouette is mildly enlarged. There is mild calcification of the aortic arch. Mild diffusely increased interstitial lung markings  are seen. Mild atelectasis is noted within the bilateral lung bases. No pleural effusion or pneumothorax is identified. A large hiatal hernia is present. Multilevel degenerative changes are seen throughout the thoracic spine. IMPRESSION: 1. Stable cardiomegaly with mildly increased interstitial lung markings. While this is likely, in part, chronic in nature, a superimposed component of interstitial edema cannot be excluded. 2. Mild bibasilar atelectasis. 3. Large hiatal hernia. Electronically Signed   By: Aram Candela M.D.   On: 02/21/2023 17:38     Reassessment and Plan:   Patient's history present on his physicals and findings are consistent with any acute pathology.  She does have chronic heart failure episode of dyspnea earlier in the day seems consistent with only dyspnea on exertion which is now resolved at rest and she is in no acute distress.  Satting well (100%) on room air no acute dyspnea at this time.  Patient stable for outpatient care management with follow-up with your primary care provider in outpatient setting primary cardiologist and heart failure specialist for further diagnostic care.  Disposition:  I have considered need for  hospitalization, however, considering all of the above, I believe this patient is stable for discharge at this time.  Patient/family educated about specific return precautions for given chief complaint and symptoms.  Patient/family educated about follow-up with PCP.     Patient/family expressed understanding of return precautions and need for follow-up. Patient spoken to regarding all imaging and laboratory results and appropriate follow up for these results. All education provided in verbal form with additional information in written form. Time was allowed for answering of patient questions. Patient discharged.    Emergency Department Medication Summary:   Medications - No data to display       Clinical Impression:  1. SOB (shortness of breath)       Discharge   Final Clinical Impression(s) / ED Diagnoses Final diagnoses:  SOB (shortness of breath)    Rx / DC Orders ED Discharge Orders     None         Glyn Ade, MD 02/21/23 1807

## 2023-02-22 ENCOUNTER — Ambulatory Visit (HOSPITAL_BASED_OUTPATIENT_CLINIC_OR_DEPARTMENT_OTHER)
Admission: RE | Admit: 2023-02-22 | Discharge: 2023-02-22 | Disposition: A | Discharge status: Home / Self Care | Payer: Medicare HMO | Source: Ambulatory Visit | Attending: Cardiology | Admitting: Cardiology

## 2023-02-22 ENCOUNTER — Telehealth (HOSPITAL_COMMUNITY): Payer: Self-pay | Admitting: *Deleted

## 2023-02-22 ENCOUNTER — Other Ambulatory Visit (HOSPITAL_COMMUNITY): Payer: Self-pay

## 2023-02-22 ENCOUNTER — Encounter (HOSPITAL_COMMUNITY): Payer: Self-pay | Admitting: Cardiology

## 2023-02-22 VITALS — BP 90/50 | HR 94 | Wt 119.8 lb

## 2023-02-22 DIAGNOSIS — I5022 Chronic systolic (congestive) heart failure: Secondary | ICD-10-CM

## 2023-02-22 DIAGNOSIS — N183 Chronic kidney disease, stage 3 unspecified: Secondary | ICD-10-CM | POA: Diagnosis not present

## 2023-02-22 DIAGNOSIS — I130 Hypertensive heart and chronic kidney disease with heart failure and stage 1 through stage 4 chronic kidney disease, or unspecified chronic kidney disease: Secondary | ICD-10-CM | POA: Insufficient documentation

## 2023-02-22 DIAGNOSIS — R9431 Abnormal electrocardiogram [ECG] [EKG]: Secondary | ICD-10-CM | POA: Insufficient documentation

## 2023-02-22 DIAGNOSIS — E039 Hypothyroidism, unspecified: Secondary | ICD-10-CM | POA: Insufficient documentation

## 2023-02-22 DIAGNOSIS — Z853 Personal history of malignant neoplasm of breast: Secondary | ICD-10-CM | POA: Insufficient documentation

## 2023-02-22 DIAGNOSIS — I5043 Acute on chronic combined systolic (congestive) and diastolic (congestive) heart failure: Secondary | ICD-10-CM

## 2023-02-22 DIAGNOSIS — I340 Nonrheumatic mitral (valve) insufficiency: Secondary | ICD-10-CM | POA: Insufficient documentation

## 2023-02-22 DIAGNOSIS — I2510 Atherosclerotic heart disease of native coronary artery without angina pectoris: Secondary | ICD-10-CM | POA: Insufficient documentation

## 2023-02-22 DIAGNOSIS — Z7901 Long term (current) use of anticoagulants: Secondary | ICD-10-CM | POA: Insufficient documentation

## 2023-02-22 DIAGNOSIS — Z923 Personal history of irradiation: Secondary | ICD-10-CM | POA: Insufficient documentation

## 2023-02-22 DIAGNOSIS — N1832 Chronic kidney disease, stage 3b: Secondary | ICD-10-CM | POA: Insufficient documentation

## 2023-02-22 DIAGNOSIS — I252 Old myocardial infarction: Secondary | ICD-10-CM | POA: Insufficient documentation

## 2023-02-22 DIAGNOSIS — I428 Other cardiomyopathies: Secondary | ICD-10-CM | POA: Insufficient documentation

## 2023-02-22 DIAGNOSIS — G4733 Obstructive sleep apnea (adult) (pediatric): Secondary | ICD-10-CM | POA: Insufficient documentation

## 2023-02-22 DIAGNOSIS — Z9221 Personal history of antineoplastic chemotherapy: Secondary | ICD-10-CM | POA: Insufficient documentation

## 2023-02-22 NOTE — Progress Notes (Signed)
PCP: [name], MD Cardiology: Dr. Bing Matter  77 y.o. 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 Bing Matter.   After moving to [place], she was initially followed in [place] and at [place]. Cardiac MRI at [facility] on 08/12/16 showed EF 26% with dilated left ventricle 6.2 cm mild to moderate mitral regurgitation. There was subendocardial hyperenhancement involving the mid-distal anterior wall, consistent with subendocardial infarction in the distribution of a diagonal branch of the LAD. Adenosine stress perfusion imaging demonstrated no evidence of inducible myocardial ischemia. Hypotension limited GDMT and she refused ICD.   In 2019, she established care with Dr. Dulce Sellar. EF 20-25 at the time. cMRI in 3/21 showed severely decreased LV function and mid-moderately decreased RV function with RVEF 34% and transmural delayed myocardial enhancement in the mid-apical LV anterior wall suggestive of prior infarct.    Overall, patient was fairly well compensated from a heart failure standpoint until March 2024. She underwent knee replacement at that time and post procedure developed SOB, weakness, fatigue, hypotension.    Echo 7/24 showed EF 30-35 with grade 1 diastolic dysfunction, normal RV, moderate MR.  She then traveled to [place] and developed COVID pneumonia and admitted to the hospital for 5 day in 7/24.    She had an admission 10/10-10/15/24 for CHF exacerbation and NSTEMI, trop 11,645 and BNP 3090. Repeat Echo 01/23/23 with EF 25-30%, G3DD, elevated LAP, global HK, nl RVSF, nl PASP, mod MR. LHC/RHC showed mean RA 2, CI 2.86, PA 36/15, mean PWCP 13 with 80% stenosis in OM3 (?thrombotic in setting of prior COVID-19). Eliquis was started.  She was  readmitted later in 10/24 for further diuresis.    After discharge from her 2nd admission in 10/24, she went to the ER on 02/19/23 with weakness and was noted to have AKI and hyperkalemia as well as elevated lactate at 2.3.  She was sent home.  Torsemide was stopped due to "dehydration."  She went back to the ER 11/10 with shortness of breath.    Patient continues to do poorly.  She feels "terrible" and very weak.  This has been progressive for around 2 months.  She is short of breath with "any activity."  She uses a walker, dyspneic just in house. Mild lightheadedness if she stands too fast.   She is not taking torsemide or Farxiga due to lightheadedness and concern that she is "dehydrated."  She is currently only taking Eliquis, Toprol XL, and atorvastatin.  Sleeps with head of bed elevated.  No chest pain.  She has chronic mid back pain wrapping around to her right rib cage area.   Labs (11/24): K 4.3, creatinine 1.78 => 1.61, hgb 13.6, AST 81, ALT 125, BNP 2950, lactate 2.3  ECG (personally reviewed): NSR, inferior Qs, anteroseptal Qs, LAFB  PMH: 1. CKD stage 3 2. COVID-19 PNA in 7/24 3. Breast cancer: Treated in 2000 with lumpectomy, XRT, and chemotherapy involving Adriamycin.  4. Chronic systolic CHF: Primarily nonischemic cardiomyopathy.  Diagnosed in 2001, though to be due to chemotheraphy (Adriamycin).   - Cardiac MRI (3/21): LV EF 20%, RV EF 34%, transmural LGE mid-apical anterior wall, possible prior infarction.  - Echo (9/22): EF 30-35%, normal RV - Echo (7/24): EF 30-35%, normal RV, moderate MR.  - Echo (10/24): EF 25-30%, dilated LV,  low normal RV function, moderate MR.  - RHC (10/24): mean RA 2, PA 36/15, mean PCWP 10, CI 2.86 5. HTN 6. Hyperlipidemia 7. OSA 8. Hypothyroidism 9. Glaucoma 10. TKR 3/24 11. CAD: LHC in 10/24 with 80% OM3 stenosis, thought to be thrombotic.   Social History   Socioeconomic History   Marital status: Married    Spouse name: Not on file    Number of children: Not on file   Years of education: Not on file   Highest education level: Not on file  Occupational History   Not on file  Tobacco Use   Smoking status: Never    Passive exposure: Never   Smokeless tobacco: Never   Tobacco comments:    NEVER USED TOBACCO  Vaping Use   Vaping status: Never Used  Substance and Sexual Activity   Alcohol use: Yes    Alcohol/week: 0.0 standard drinks of alcohol    Comment: occ   Drug use: No   Sexual activity: Not on file  Other Topics Concern   Not on file  Social History Narrative   Not on file   Social Determinants of Health   Financial Resource Strain: Not on file  Food Insecurity: Low Risk  (02/10/2023)   Received from [facility]   Hunger Vital Sign    Worried About Running Out of Food in the Last Year: Never true    Ran Out of Food in the Last Year: Never true  Transportation Needs: No Transportation Needs (02/10/2023)   Received from [facility]    In the past 12 months, has lack of reliable transportation kept you from medical appointments, meetings, work or from getting things needed for daily living? : No  Physical Activity: Not on file  Stress: Not on file  Social Connections: Unknown (06/27/2022)   [facility] from [facility]   Social Network    Social Network: Not on file  Intimate Partner Violence: Not At Risk (01/22/2023)   Humiliation, Afraid, Rape, and Kick questionnaire    Fear of Current or Ex-Partner: No    Emotionally Abused: No    Physically Abused: No    Sexually Abused: No   Family History  Problem Relation Age of Onset   Hypertension Mother    Heart attack Neg Hx    Stroke Neg Hx    Diabetes Neg Hx    ROS: All systems reviewed and negative except as per HPI.   Current Outpatient Medications  Medication Sig Dispense Refill   albuterol (PROVENTIL HFA;VENTOLIN HFA) 108 (90 Base) MCG/ACT inhaler Inhale 2 puffs into the lungs every 6 (six) hours as needed  for wheezing or shortness of breath. 1 Inhaler 6   alendronate (FOSAMAX) 35 MG tablet Take 35 mg by mouth once a week.     ALPRAZolam (XANAX) 0.25 MG tablet Take 1 tablet (0.25 mg total) by mouth every 8 (eight) hours as needed for anxiety. 15 tablet 0   apixaban (ELIQUIS) 5 MG TABS tablet Take 1 tablet (5 mg total) by mouth 2 (two) times daily. 180 tablet 0   ARNUITY ELLIPTA 100 MCG/ACT AEPB Inhale 1 puff into the lungs daily.     atorvastatin (LIPITOR) 40 MG tablet Take 1 tablet (40 mg total) by mouth daily.     Biotin 5000 MCG TABS Take 1 tablet by mouth daily.     brinzolamide (AZOPT) 1 % ophthalmic suspension Place 1 drop into both eyes 2 (two) times daily.  Calcium Carbonate-Vitamin D (CALCIUM + D PO) Take 1 tablet by mouth 2 (two) times daily.      Cholecalciferol 25 MCG (1000 UT) tablet Take 1,000 Units by mouth daily.      dapagliflozin propanediol (FARXIGA) 10 MG TABS tablet Take 1 tablet (10 mg total) by mouth daily. 30 tablet 5   ferrous sulfate 325 (65 FE) MG tablet Take 325 mg by mouth daily.     fluticasone (FLONASE) 50 MCG/ACT nasal spray Place 2 sprays into both nostrils daily. 16 g 2   Hypromellose (GENTEAL OP) Place 1 drop into both eyes in the morning, at noon, in the evening, and at bedtime.     latanoprost (XALATAN) 0.005 % ophthalmic solution Place 1 drop into both eyes at bedtime.     levothyroxine (SYNTHROID, LEVOTHROID) 100 MCG tablet Take 100 mcg by mouth daily.     Loratadine 10 MG CAPS Take 10 mg by mouth daily.     metoprolol succinate (TOPROL-XL) 25 MG 24 hr tablet Take 1 tablet (25 mg total) by mouth daily. 30 tablet 5   Multiple Vitamins-Minerals (CENTRUM SILVER PO) Take 1 tablet by mouth every morning.     nitroGLYCERIN (NITROSTAT) 0.4 MG SL tablet Place 1 tablet (0.4 mg total) under the tongue every 5 (five) minutes as needed for chest pain (up to 3 doses). 25 tablet 3   Travoprost, BAK Free, (TRAVATAN) 0.004 % SOLN ophthalmic solution Place 1 drop into both  eyes at bedtime.     vitamin B-12 (CYANOCOBALAMIN) 1000 MCG tablet Take 1,000 mcg by mouth daily.     vitamin C (ASCORBIC ACID) 500 MG tablet Take 500 mg by mouth daily.     potassium chloride SA (KLOR-CON M) 20 MEQ tablet Take 2 tablets (40 mEq total) by mouth 2 (two) times daily. (Patient not taking: Reported on 02/22/2023) 720 tablet 0   torsemide (DEMADEX) 20 MG tablet Take 2 tablets (40 mg total) by mouth every morning AND 1 tablet (20 mg total) daily at 6 PM. (Patient not taking: Reported on 02/22/2023) 270 tablet 0   torsemide (DEMADEX) 20 MG tablet Take 1 tablet (20 mg total) by mouth daily as needed (Take additional 20mg  daily (in addition to 40mg  AM dose) for shortness of breath/weight gain (total dose 60mg )). (Patient not taking: Reported on 02/22/2023)     No current facility-administered medications for this encounter.   BP (!) 90/50   Pulse 94   Wt 54.3 kg (119 lb 12.8 oz)   SpO2 99%   BMI 24.20 kg/m  General: NAD Neck: JVP 8-9 cm with HJR, no thyromegaly or thyroid nodule.  Lungs: Clear to auscultation bilaterally with normal respiratory effort. CV: Nondisplaced PMI.  Heart regular S1/S2, no S3/S4, 1/6 HSM apex.  Trace ankle edema.  No carotid bruit.  Difficult to palpate pedal pulses.  Abdomen: Soft, nontender, no hepatosplenomegaly, no distention.  Skin: Intact without lesions or rashes.  Neurologic: Alert and oriented x 3.  Psych: Normal affect. Extremities: No clubbing or cyanosis. Cool extremities.  HEENT: Normal.   Assessment/Plan:  1. Chronic systolic CHF: Primarily nonischemic cardiomyopathy.  Thought to be due to prior Adriamycin chemotherapy (chemo in 2000, cardiomyopathy diagnosed 2001).  She refused ICD in the past.  Cardiac MRI in 3/21 showed LV EF 20%, RV EF 34%, transmural LGE mid-apical anterior wall, possible prior infarction.  Most recent echo in 10/24 showed EF 25-30%, dilated LV, low normal RV function, moderate MR.  RHC in 10/24 did not show  low output.   Today, patient reports NYHA class IV symptoms at home. She is currently off torsemide and Marcelline Deist as she says that they make her feel weak and listless.  Extremities are cool, lactate drawn in the ER 1 week ago was elevated at 2.3.  On exam, she does appear volume overloaded.  I suspect low output HF complicated by cardiorenal syndrome.  - I am going to bring her in for RHC tomorrow, will likely admit her after procedure if cardiac output is low.  Will start milrinone in that case. We discussed risks/benefits and she agrees to procedure.  Hold Eliquis today and tomorrow for cath.  - She will take torsemide 10 mg daily for now. BMET in 10 days.  - Decrease Toprol XL to 12.5 mg daily given concern for low output.  - Unable to tolerate other GDMT due to low BP and elevated creatinine.  - If low output HF found, need to consider LVAD but would be difficult with renal dysfunction and frailty.  2. CAD: Cath in 10/24 in setting of NSTEMI showed 80% OM3 stenosis, possibly a thrombotic lesion post-COVID-19 PNA.  No chest pain.   - Continue Eliquis 5 mg bid (?thrombotic coronary lesion).  - Continue atorvastatin 40 daily.  3. CKD stage 3: Creatinine 1.61 yesterday.  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 - Follow BMET closely. 4. Mitral regurgitation: At least moderate MR on last echo.  There has been some consideration for possible Mitraclip.  Consider eventual evaluation by TEE.  Marca Ancona 02/22/2023 5:49 PM

## 2023-02-22 NOTE — H&P (View-Only) (Signed)
PCP: [name], MD Cardiology: Dr. Bing Matter  77 y.o. 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 Bing Matter.   After moving to [place], she was initially followed in [place] and at [place]. Cardiac MRI at [facility] on 08/12/16 showed EF 26% with dilated left ventricle 6.2 cm mild to moderate mitral regurgitation. There was subendocardial hyperenhancement involving the mid-distal anterior wall, consistent with subendocardial infarction in the distribution of a diagonal branch of the LAD. Adenosine stress perfusion imaging demonstrated no evidence of inducible myocardial ischemia. Hypotension limited GDMT and she refused ICD.   In 2019, she established care with Dr. Dulce Sellar. EF 20-25 at the time. cMRI in 3/21 showed severely decreased LV function and mid-moderately decreased RV function with RVEF 34% and transmural delayed myocardial enhancement in the mid-apical LV anterior wall suggestive of prior infarct.    Overall, patient was fairly well compensated from a heart failure standpoint until March 2024. She underwent knee replacement at that time and post procedure developed SOB, weakness, fatigue, hypotension.    Echo 7/24 showed EF 30-35 with grade 1 diastolic dysfunction, normal RV, moderate MR.  She then traveled to [place] and developed COVID pneumonia and admitted to the hospital for 5 day in 7/24.    She had an admission 10/10-10/15/24 for CHF exacerbation and NSTEMI, trop 11,645 and BNP 3090. Repeat Echo 01/23/23 with EF 25-30%, G3DD, elevated LAP, global HK, nl RVSF, nl PASP, mod MR. LHC/RHC showed mean RA 2, CI 2.86, PA 36/15, mean PWCP 13 with 80% stenosis in OM3 (?thrombotic in setting of prior COVID-19). Eliquis was started.  She was  readmitted later in 10/24 for further diuresis.    After discharge from her 2nd admission in 10/24, she went to the ER on 02/19/23 with weakness and was noted to have AKI and hyperkalemia as well as elevated lactate at 2.3.  She was sent home.  Torsemide was stopped due to "dehydration."  She went back to the ER 11/10 with shortness of breath.    Patient continues to do poorly.  She feels "terrible" and very weak.  This has been progressive for around 2 months.  She is short of breath with "any activity."  She uses a walker, dyspneic just in house. Mild lightheadedness if she stands too fast.   She is not taking torsemide or Farxiga due to lightheadedness and concern that she is "dehydrated."  She is currently only taking Eliquis, Toprol XL, and atorvastatin.  Sleeps with head of bed elevated.  No chest pain.  She has chronic mid back pain wrapping around to her right rib cage area.   Labs (11/24): K 4.3, creatinine 1.78 => 1.61, hgb 13.6, AST 81, ALT 125, BNP 2950, lactate 2.3  ECG (personally reviewed): NSR, inferior Qs, anteroseptal Qs, LAFB  PMH: 1. CKD stage 3 2. COVID-19 PNA in 7/24 3. Breast cancer: Treated in 2000 with lumpectomy, XRT, and chemotherapy involving Adriamycin.  4. Chronic systolic CHF: Primarily nonischemic cardiomyopathy.  Diagnosed in 2001, though to be due to chemotheraphy (Adriamycin).   - Cardiac MRI (3/21): LV EF 20%, RV EF 34%, transmural LGE mid-apical anterior wall, possible prior infarction.  - Echo (9/22): EF 30-35%, normal RV - Echo (7/24): EF 30-35%, normal RV, moderate MR.  - Echo (10/24): EF 25-30%, dilated LV,  low normal RV function, moderate MR.  - RHC (10/24): mean RA 2, PA 36/15, mean PCWP 10, CI 2.86 5. HTN 6. Hyperlipidemia 7. OSA 8. Hypothyroidism 9. Glaucoma 10. TKR 3/24 11. CAD: LHC in 10/24 with 80% OM3 stenosis, thought to be thrombotic.   Social History   Socioeconomic History   Marital status: Married    Spouse name: Not on file    Number of children: Not on file   Years of education: Not on file   Highest education level: Not on file  Occupational History   Not on file  Tobacco Use   Smoking status: Never    Passive exposure: Never   Smokeless tobacco: Never   Tobacco comments:    NEVER USED TOBACCO  Vaping Use   Vaping status: Never Used  Substance and Sexual Activity   Alcohol use: Yes    Alcohol/week: 0.0 standard drinks of alcohol    Comment: occ   Drug use: No   Sexual activity: Not on file  Other Topics Concern   Not on file  Social History Narrative   Not on file   Social Determinants of Health   Financial Resource Strain: Not on file  Food Insecurity: Low Risk  (02/10/2023)   Received from [facility]   Hunger Vital Sign    Worried About Running Out of Food in the Last Year: Never true    Ran Out of Food in the Last Year: Never true  Transportation Needs: No Transportation Needs (02/10/2023)   Received from [facility]    In the past 12 months, has lack of reliable transportation kept you from medical appointments, meetings, work or from getting things needed for daily living? : No  Physical Activity: Not on file  Stress: Not on file  Social Connections: Unknown (06/27/2022)   Received from [facility]   Social Network    Social Network: Not on file  Intimate Partner Violence: Not At Risk (01/22/2023)   Humiliation, Afraid, Rape, and Kick questionnaire    Fear of Current or Ex-Partner: No    Emotionally Abused: No    Physically Abused: No    Sexually Abused: No   Family History  Problem Relation Age of Onset   Hypertension Mother    Heart attack Neg Hx    Stroke Neg Hx    Diabetes Neg Hx    ROS: All systems reviewed and negative except as per HPI.   Current Outpatient Medications  Medication Sig Dispense Refill   albuterol (PROVENTIL HFA;VENTOLIN HFA) 108 (90 Base) MCG/ACT inhaler Inhale 2 puffs into the lungs every 6 (six) hours as needed  for wheezing or shortness of breath. 1 Inhaler 6   alendronate (FOSAMAX) 35 MG tablet Take 35 mg by mouth once a week.     ALPRAZolam (XANAX) 0.25 MG tablet Take 1 tablet (0.25 mg total) by mouth every 8 (eight) hours as needed for anxiety. 15 tablet 0   apixaban (ELIQUIS) 5 MG TABS tablet Take 1 tablet (5 mg total) by mouth 2 (two) times daily. 180 tablet 0   ARNUITY ELLIPTA 100 MCG/ACT AEPB Inhale 1 puff into the lungs daily.     atorvastatin (LIPITOR) 40 MG tablet Take 1 tablet (40 mg total) by mouth daily.     Biotin 5000 MCG TABS Take 1 tablet by mouth daily.     brinzolamide (AZOPT) 1 % ophthalmic suspension Place 1 drop into both eyes 2 (two) times daily.  Calcium Carbonate-Vitamin D (CALCIUM + D PO) Take 1 tablet by mouth 2 (two) times daily.      Cholecalciferol 25 MCG (1000 UT) tablet Take 1,000 Units by mouth daily.      dapagliflozin propanediol (FARXIGA) 10 MG TABS tablet Take 1 tablet (10 mg total) by mouth daily. 30 tablet 5   ferrous sulfate 325 (65 FE) MG tablet Take 325 mg by mouth daily.     fluticasone (FLONASE) 50 MCG/ACT nasal spray Place 2 sprays into both nostrils daily. 16 g 2   Hypromellose (GENTEAL OP) Place 1 drop into both eyes in the morning, at noon, in the evening, and at bedtime.     latanoprost (XALATAN) 0.005 % ophthalmic solution Place 1 drop into both eyes at bedtime.     levothyroxine (SYNTHROID, LEVOTHROID) 100 MCG tablet Take 100 mcg by mouth daily.     Loratadine 10 MG CAPS Take 10 mg by mouth daily.     metoprolol succinate (TOPROL-XL) 25 MG 24 hr tablet Take 1 tablet (25 mg total) by mouth daily. 30 tablet 5   Multiple Vitamins-Minerals (CENTRUM SILVER PO) Take 1 tablet by mouth every morning.     nitroGLYCERIN (NITROSTAT) 0.4 MG SL tablet Place 1 tablet (0.4 mg total) under the tongue every 5 (five) minutes as needed for chest pain (up to 3 doses). 25 tablet 3   Travoprost, BAK Free, (TRAVATAN) 0.004 % SOLN ophthalmic solution Place 1 drop into both  eyes at bedtime.     vitamin B-12 (CYANOCOBALAMIN) 1000 MCG tablet Take 1,000 mcg by mouth daily.     vitamin C (ASCORBIC ACID) 500 MG tablet Take 500 mg by mouth daily.     potassium chloride SA (KLOR-CON M) 20 MEQ tablet Take 2 tablets (40 mEq total) by mouth 2 (two) times daily. (Patient not taking: Reported on 02/22/2023) 720 tablet 0   torsemide (DEMADEX) 20 MG tablet Take 2 tablets (40 mg total) by mouth every morning AND 1 tablet (20 mg total) daily at 6 PM. (Patient not taking: Reported on 02/22/2023) 270 tablet 0   torsemide (DEMADEX) 20 MG tablet Take 1 tablet (20 mg total) by mouth daily as needed (Take additional 20mg  daily (in addition to 40mg  AM dose) for shortness of breath/weight gain (total dose 60mg )). (Patient not taking: Reported on 02/22/2023)     No current facility-administered medications for this encounter.   BP (!) 90/50   Pulse 94   Wt 54.3 kg (119 lb 12.8 oz)   SpO2 99%   BMI 24.20 kg/m  General: NAD Neck: JVP 8-9 cm with HJR, no thyromegaly or thyroid nodule.  Lungs: Clear to auscultation bilaterally with normal respiratory effort. CV: Nondisplaced PMI.  Heart regular S1/S2, no S3/S4, 1/6 HSM apex.  Trace ankle edema.  No carotid bruit.  Difficult to palpate pedal pulses.  Abdomen: Soft, nontender, no hepatosplenomegaly, no distention.  Skin: Intact without lesions or rashes.  Neurologic: Alert and oriented x 3.  Psych: Normal affect. Extremities: No clubbing or cyanosis. Cool extremities.  HEENT: Normal.   Assessment/Plan:  1. Chronic systolic CHF: Primarily nonischemic cardiomyopathy.  Thought to be due to prior Adriamycin chemotherapy (chemo in 2000, cardiomyopathy diagnosed 2001).  She refused ICD in the past.  Cardiac MRI in 3/21 showed LV EF 20%, RV EF 34%, transmural LGE mid-apical anterior wall, possible prior infarction.  Most recent echo in 10/24 showed EF 25-30%, dilated LV, low normal RV function, moderate MR.  RHC in 10/24 did not show  low output.   Today, patient reports NYHA class IV symptoms at home. She is currently off torsemide and Marcelline Deist as she says that they make her feel weak and listless.  Extremities are cool, lactate drawn in the ER 1 week ago was elevated at 2.3.  On exam, she does appear volume overloaded.  I suspect low output HF complicated by cardiorenal syndrome.  - I am going to bring her in for RHC tomorrow, will likely admit her after procedure if cardiac output is low.  Will start milrinone in that case. We discussed risks/benefits and she agrees to procedure.  Hold Eliquis today and tomorrow for cath.  - She will take torsemide 10 mg daily for now. BMET in 10 days.  - Decrease Toprol XL to 12.5 mg daily given concern for low output.  - Unable to tolerate other GDMT due to low BP and elevated creatinine.  - If low output HF found, need to consider LVAD but would be difficult with renal dysfunction and frailty.  2. CAD: Cath in 10/24 in setting of NSTEMI showed 80% OM3 stenosis, possibly a thrombotic lesion post-COVID-19 PNA.  No chest pain.   - Continue Eliquis 5 mg bid (?thrombotic coronary lesion).  - Continue atorvastatin 40 daily.  3. CKD stage 3: Creatinine 1.61 yesterday.  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 - Follow BMET closely. 4. Mitral regurgitation: At least moderate MR on last echo.  There has been some consideration for possible Mitraclip.  Consider eventual evaluation by TEE.  Marca Ancona 02/22/2023 5:49 PM

## 2023-02-22 NOTE — Patient Instructions (Signed)
DECREASE Toprol XL to 12.5 mg daily.  CHANGE Torsemide to 10 mg daily.   You are scheduled for a Cardiac Catheterization on Tuesday, November 12 with Dr. Marca Ancona.  1. Please arrive at the [facility] (Main Entrance A) at [facility]: [address] [place], [place] [address] at 11:30 AM (This time is 2 hour(s) before your procedure to ensure your preparation). Free valet parking service is available. You will check in at ADMITTING. The support person will be asked to wait in the waiting room.  It is OK to have someone drop you off and come back when you are ready to be discharged.    Special note: Every effort is made to have your procedure done on time. Please understand that emergencies sometimes delay scheduled procedures.  2. Diet: Do not eat solid foods after midnight.  The patient may have clear liquids until 5am upon the day of the procedure.  3.  Medication instructions in preparation for your procedure:   Contrast Allergy: No  HOLD YOUR EVENING DOSE OF ELIQUIS TONIGHT 02/22/23 and TOMORROW MORNING 02/23/23.  HOLD Torsemide and Farxiga in the morning   On the morning of your procedure, take any morning medicines NOT listed above.  You may use sips of water.  5. Plan to go home the same day, you will only stay overnight if medically necessary. 6. Bring a current list of your medications and current insurance cards. 7. You MUST have a responsible person to drive you home. 8. Someone MUST be with you the first 24 hours after you arrive home or your discharge will be delayed. 9. Please wear clothes that are easy to get on and off and wear slip-on shoes.

## 2023-02-23 ENCOUNTER — Other Ambulatory Visit: Payer: Self-pay

## 2023-02-23 ENCOUNTER — Encounter (HOSPITAL_COMMUNITY)
Admission: AD | Disposition: A | Discharge status: Home / Self Care | Payer: Self-pay | Source: Home / Self Care | Attending: Cardiology

## 2023-02-23 ENCOUNTER — Inpatient Hospital Stay (HOSPITAL_COMMUNITY)
Admission: AD | Admit: 2023-02-23 | Discharge: 2023-03-04 | DRG: 286 | Disposition: A | Discharge status: Home Health | Payer: Medicare HMO | Attending: Cardiology | Admitting: Cardiology

## 2023-02-23 ENCOUNTER — Encounter (HOSPITAL_COMMUNITY): Payer: Self-pay | Admitting: Cardiology

## 2023-02-23 DIAGNOSIS — I471 Supraventricular tachycardia, unspecified: Secondary | ICD-10-CM | POA: Diagnosis present

## 2023-02-23 DIAGNOSIS — Z880 Allergy status to penicillin: Secondary | ICD-10-CM

## 2023-02-23 DIAGNOSIS — I428 Other cardiomyopathies: Secondary | ICD-10-CM | POA: Diagnosis present

## 2023-02-23 DIAGNOSIS — I5023 Acute on chronic systolic (congestive) heart failure: Secondary | ICD-10-CM | POA: Diagnosis present

## 2023-02-23 DIAGNOSIS — I5021 Acute systolic (congestive) heart failure: Secondary | ICD-10-CM | POA: Diagnosis not present

## 2023-02-23 DIAGNOSIS — E871 Hypo-osmolality and hyponatremia: Secondary | ICD-10-CM | POA: Diagnosis not present

## 2023-02-23 DIAGNOSIS — Z8616 Personal history of COVID-19: Secondary | ICD-10-CM | POA: Diagnosis not present

## 2023-02-23 DIAGNOSIS — Z9221 Personal history of antineoplastic chemotherapy: Secondary | ICD-10-CM

## 2023-02-23 DIAGNOSIS — E039 Hypothyroidism, unspecified: Secondary | ICD-10-CM | POA: Diagnosis present

## 2023-02-23 DIAGNOSIS — N1832 Chronic kidney disease, stage 3b: Secondary | ICD-10-CM | POA: Diagnosis present

## 2023-02-23 DIAGNOSIS — I472 Ventricular tachycardia, unspecified: Secondary | ICD-10-CM | POA: Diagnosis not present

## 2023-02-23 DIAGNOSIS — Z515 Encounter for palliative care: Secondary | ICD-10-CM | POA: Diagnosis not present

## 2023-02-23 DIAGNOSIS — I2510 Atherosclerotic heart disease of native coronary artery without angina pectoris: Secondary | ICD-10-CM | POA: Diagnosis present

## 2023-02-23 DIAGNOSIS — Z79890 Hormone replacement therapy (postmenopausal): Secondary | ICD-10-CM | POA: Diagnosis not present

## 2023-02-23 DIAGNOSIS — Z8249 Family history of ischemic heart disease and other diseases of the circulatory system: Secondary | ICD-10-CM

## 2023-02-23 DIAGNOSIS — E876 Hypokalemia: Secondary | ICD-10-CM | POA: Diagnosis not present

## 2023-02-23 DIAGNOSIS — I5082 Biventricular heart failure: Secondary | ICD-10-CM | POA: Diagnosis present

## 2023-02-23 DIAGNOSIS — Z853 Personal history of malignant neoplasm of breast: Secondary | ICD-10-CM

## 2023-02-23 DIAGNOSIS — I340 Nonrheumatic mitral (valve) insufficiency: Secondary | ICD-10-CM | POA: Diagnosis present

## 2023-02-23 DIAGNOSIS — T463X5A Adverse effect of coronary vasodilators, initial encounter: Secondary | ICD-10-CM | POA: Diagnosis present

## 2023-02-23 DIAGNOSIS — E860 Dehydration: Secondary | ICD-10-CM | POA: Diagnosis present

## 2023-02-23 DIAGNOSIS — Z79899 Other long term (current) drug therapy: Secondary | ICD-10-CM

## 2023-02-23 DIAGNOSIS — I5043 Acute on chronic combined systolic (congestive) and diastolic (congestive) heart failure: Secondary | ICD-10-CM | POA: Diagnosis not present

## 2023-02-23 DIAGNOSIS — Z1152 Encounter for screening for COVID-19: Secondary | ICD-10-CM | POA: Diagnosis not present

## 2023-02-23 DIAGNOSIS — I130 Hypertensive heart and chronic kidney disease with heart failure and stage 1 through stage 4 chronic kidney disease, or unspecified chronic kidney disease: Secondary | ICD-10-CM | POA: Diagnosis present

## 2023-02-23 DIAGNOSIS — I5084 End stage heart failure: Secondary | ICD-10-CM | POA: Diagnosis present

## 2023-02-23 DIAGNOSIS — I252 Old myocardial infarction: Secondary | ICD-10-CM

## 2023-02-23 DIAGNOSIS — I429 Cardiomyopathy, unspecified: Secondary | ICD-10-CM | POA: Diagnosis not present

## 2023-02-23 DIAGNOSIS — Z923 Personal history of irradiation: Secondary | ICD-10-CM

## 2023-02-23 DIAGNOSIS — I509 Heart failure, unspecified: Principal | ICD-10-CM

## 2023-02-23 DIAGNOSIS — Z885 Allergy status to narcotic agent status: Secondary | ICD-10-CM

## 2023-02-23 DIAGNOSIS — G8929 Other chronic pain: Secondary | ICD-10-CM | POA: Diagnosis present

## 2023-02-23 DIAGNOSIS — Z7983 Long term (current) use of bisphosphonates: Secondary | ICD-10-CM

## 2023-02-23 DIAGNOSIS — E785 Hyperlipidemia, unspecified: Secondary | ICD-10-CM | POA: Diagnosis present

## 2023-02-23 DIAGNOSIS — Z96659 Presence of unspecified artificial knee joint: Secondary | ICD-10-CM | POA: Diagnosis present

## 2023-02-23 DIAGNOSIS — G4733 Obstructive sleep apnea (adult) (pediatric): Secondary | ICD-10-CM | POA: Diagnosis present

## 2023-02-23 DIAGNOSIS — Z7951 Long term (current) use of inhaled steroids: Secondary | ICD-10-CM

## 2023-02-23 DIAGNOSIS — Z882 Allergy status to sulfonamides status: Secondary | ICD-10-CM

## 2023-02-23 DIAGNOSIS — Z7901 Long term (current) use of anticoagulants: Secondary | ICD-10-CM

## 2023-02-23 DIAGNOSIS — I5022 Chronic systolic (congestive) heart failure: Secondary | ICD-10-CM

## 2023-02-23 DIAGNOSIS — H409 Unspecified glaucoma: Secondary | ICD-10-CM | POA: Diagnosis present

## 2023-02-23 HISTORY — PX: RIGHT HEART CATH: CATH118263

## 2023-02-23 LAB — POCT I-STAT EG7
Acid-base deficit: 2 mmol/L (ref 0.0–2.0)
Acid-base deficit: 2 mmol/L (ref 0.0–2.0)
Bicarbonate: 22.2 mmol/L (ref 20.0–28.0)
Bicarbonate: 22.4 mmol/L (ref 20.0–28.0)
Calcium, Ion: 1.19 mmol/L (ref 1.15–1.40)
Calcium, Ion: 1.21 mmol/L (ref 1.15–1.40)
HCT: 41 % (ref 36.0–46.0)
HCT: 42 % (ref 36.0–46.0)
Hemoglobin: 13.9 g/dL (ref 12.0–15.0)
Hemoglobin: 14.3 g/dL (ref 12.0–15.0)
O2 Saturation: 55 %
O2 Saturation: 57 %
Potassium: 3.6 mmol/L (ref 3.5–5.1)
Potassium: 3.6 mmol/L (ref 3.5–5.1)
Sodium: 135 mmol/L (ref 135–145)
Sodium: 136 mmol/L (ref 135–145)
TCO2: 23 mmol/L (ref 22–32)
TCO2: 23 mmol/L (ref 22–32)
pCO2, Ven: 35.4 mm[Hg] — ABNORMAL LOW (ref 44–60)
pCO2, Ven: 35.6 mm[Hg] — ABNORMAL LOW (ref 44–60)
pH, Ven: 7.402 (ref 7.25–7.43)
pH, Ven: 7.409 (ref 7.25–7.43)
pO2, Ven: 28 mm[Hg] — CL (ref 32–45)
pO2, Ven: 29 mm[Hg] — CL (ref 32–45)

## 2023-02-23 LAB — COMPREHENSIVE METABOLIC PANEL
ALT: 115 U/L — ABNORMAL HIGH (ref 0–44)
AST: 68 U/L — ABNORMAL HIGH (ref 15–41)
Albumin: 3.1 g/dL — ABNORMAL LOW (ref 3.5–5.0)
Alkaline Phosphatase: 171 U/L — ABNORMAL HIGH (ref 38–126)
Anion gap: 12 (ref 5–15)
BUN: 45 mg/dL — ABNORMAL HIGH (ref 8–23)
CO2: 22 mmol/L (ref 22–32)
Calcium: 9.1 mg/dL (ref 8.9–10.3)
Chloride: 99 mmol/L (ref 98–111)
Creatinine, Ser: 1.81 mg/dL — ABNORMAL HIGH (ref 0.44–1.00)
GFR, Estimated: 28 mL/min — ABNORMAL LOW (ref >60)
Glucose, Bld: 108 mg/dL — ABNORMAL HIGH (ref 70–99)
Potassium: 3.4 mmol/L — ABNORMAL LOW (ref 3.5–5.1)
Sodium: 133 mmol/L — ABNORMAL LOW (ref 135–145)
Total Bilirubin: 1.2 mg/dL — ABNORMAL HIGH (ref <1.2)
Total Protein: 6.1 g/dL — ABNORMAL LOW (ref 6.5–8.1)

## 2023-02-23 LAB — CBC
HCT: 38 % (ref 36.0–46.0)
Hemoglobin: 12.4 g/dL (ref 12.0–15.0)
MCH: 26.4 pg (ref 26.0–34.0)
MCHC: 32.6 g/dL (ref 30.0–36.0)
MCV: 80.9 fL (ref 80.0–100.0)
Platelets: 264 10*3/uL (ref 150–400)
RBC: 4.7 MIL/uL (ref 3.87–5.11)
RDW: 15.9 % — ABNORMAL HIGH (ref 11.5–15.5)
WBC: 6.2 10*3/uL (ref 4.0–10.5)
nRBC: 0 % (ref 0.0–0.2)

## 2023-02-23 LAB — BRAIN NATRIURETIC PEPTIDE: B Natriuretic Peptide: 4190.1 pg/mL — ABNORMAL HIGH (ref 0.0–100.0)

## 2023-02-23 LAB — TSH: TSH: 1.717 u[IU]/mL (ref 0.350–4.500)

## 2023-02-23 LAB — LACTIC ACID, PLASMA: Lactic Acid, Venous: 1.2 mmol/L (ref 0.5–1.9)

## 2023-02-23 LAB — MAGNESIUM: Magnesium: 2.3 mg/dL (ref 1.7–2.4)

## 2023-02-23 SURGERY — RIGHT HEART CATH
Anesthesia: LOCAL

## 2023-02-23 MED ORDER — VITAMIN B-12 1000 MCG PO TABS
1000.0000 ug | ORAL_TABLET | Freq: Every day | ORAL | Status: DC
  Administered 2023-02-24 – 2023-03-04 (×9): 1000 ug via ORAL
  Filled 2023-02-23 (×9): qty 1

## 2023-02-23 MED ORDER — HEPARIN SODIUM (PORCINE) 5000 UNIT/ML IJ SOLN: 5000.0000 [IU] | Freq: Three times a day (TID) | INTRAMUSCULAR | Status: DC

## 2023-02-23 MED ORDER — BRINZOLAMIDE 1 % OP SUSP
1.0000 [drp] | Freq: Two times a day (BID) | OPHTHALMIC | Status: DC
  Administered 2023-02-23 – 2023-03-04 (×17): 1 [drp] via OPHTHALMIC
  Filled 2023-02-23: qty 10

## 2023-02-23 MED ORDER — ALPRAZOLAM 0.25 MG PO TABS
0.2500 mg | ORAL_TABLET | Freq: Three times a day (TID) | ORAL | Status: DC | PRN
  Administered 2023-02-25 – 2023-03-03 (×6): 0.25 mg via ORAL
  Filled 2023-02-23 (×6): qty 1

## 2023-02-23 MED ORDER — SODIUM CHLORIDE 0.9% FLUSH: 3.0000 mL | INTRAVENOUS | Status: DC | PRN

## 2023-02-23 MED ORDER — LEVOTHYROXINE SODIUM 100 MCG PO TABS
100.0000 ug | ORAL_TABLET | Freq: Every day | ORAL | Status: DC
  Administered 2023-02-24 – 2023-03-04 (×9): 100 ug via ORAL
  Filled 2023-02-23 (×9): qty 1

## 2023-02-23 MED ORDER — SODIUM CHLORIDE 0.9% FLUSH
10.0000 mL | Freq: Two times a day (BID) | INTRAVENOUS | Status: DC
  Administered 2023-02-23: 10 mL

## 2023-02-23 MED ORDER — SODIUM CHLORIDE 0.9 % IV SOLN: 250.0000 mL | INTRAVENOUS | Status: DC | PRN

## 2023-02-23 MED ORDER — ACETAMINOPHEN 325 MG PO TABS: 650.0000 mg | ORAL_TABLET | ORAL | Status: DC | PRN

## 2023-02-23 MED ORDER — ONDANSETRON HCL 4 MG/2ML IJ SOLN: 4.0000 mg | Freq: Four times a day (QID) | INTRAMUSCULAR | Status: DC | PRN

## 2023-02-23 MED ORDER — FUROSEMIDE 10 MG/ML IJ SOLN
40.0000 mg | Freq: Once | INTRAMUSCULAR | Status: AC
  Administered 2023-02-23: 40 mg via INTRAVENOUS
  Filled 2023-02-23: qty 4

## 2023-02-23 MED ORDER — ALBUTEROL SULFATE HFA 108 (90 BASE) MCG/ACT IN AERS: 2.0000 | INHALATION_SPRAY | Freq: Four times a day (QID) | RESPIRATORY_TRACT | Status: DC | PRN

## 2023-02-23 MED ORDER — LATANOPROST 0.005 % OP SOLN
1.0000 [drp] | Freq: Every day | OPHTHALMIC | Status: DC
Start: 2023-02-23 — End: 2023-03-04
  Administered 2023-02-23 – 2023-03-03 (×9): 1 [drp] via OPHTHALMIC
  Filled 2023-02-23: qty 2.5

## 2023-02-23 MED ORDER — DAPAGLIFLOZIN PROPANEDIOL 10 MG PO TABS
10.0000 mg | ORAL_TABLET | Freq: Every day | ORAL | Status: DC
  Administered 2023-02-23 – 2023-03-04 (×10): 10 mg via ORAL
  Filled 2023-02-23 (×10): qty 1

## 2023-02-23 MED ORDER — ATORVASTATIN CALCIUM 40 MG PO TABS
40.0000 mg | ORAL_TABLET | Freq: Every day | ORAL | Status: DC
  Administered 2023-02-23 – 2023-03-04 (×10): 40 mg via ORAL
  Filled 2023-02-23 (×10): qty 1

## 2023-02-23 MED ORDER — MILRINONE LACTATE IN DEXTROSE 20-5 MG/100ML-% IV SOLN
0.2500 ug/kg/min | INTRAVENOUS | Status: DC
  Administered 2023-02-23 – 2023-02-24 (×2): 0.25 ug/kg/min via INTRAVENOUS
  Administered 2023-02-25 – 2023-02-27 (×2): 0.125 ug/kg/min via INTRAVENOUS
  Administered 2023-03-01 – 2023-03-03 (×3): 0.25 ug/kg/min via INTRAVENOUS
  Filled 2023-02-23 (×7): qty 100

## 2023-02-23 MED ORDER — CHLORHEXIDINE GLUCONATE CLOTH 2 % EX PADS
6.0000 | MEDICATED_PAD | Freq: Every day | CUTANEOUS | Status: DC
  Administered 2023-02-23 – 2023-03-04 (×10): 6 via TOPICAL

## 2023-02-23 MED ORDER — LIDOCAINE HCL (PF) 1 % IJ SOLN
INTRAMUSCULAR | Status: DC | PRN
  Administered 2023-02-23: 1 mL

## 2023-02-23 MED ORDER — LIDOCAINE HCL (PF) 1 % IJ SOLN
INTRAMUSCULAR | Status: AC
  Filled 2023-02-23: qty 30

## 2023-02-23 MED ORDER — APIXABAN 5 MG PO TABS
5.0000 mg | ORAL_TABLET | Freq: Two times a day (BID) | ORAL | Status: DC
  Administered 2023-02-24 – 2023-02-26 (×5): 5 mg via ORAL
  Filled 2023-02-23 (×5): qty 1

## 2023-02-23 MED ORDER — HYDRALAZINE HCL 20 MG/ML IJ SOLN: 10.0000 mg | INTRAMUSCULAR | Status: AC | PRN

## 2023-02-23 MED ORDER — BUDESONIDE 0.25 MG/2ML IN SUSP
0.2500 mg | Freq: Two times a day (BID) | RESPIRATORY_TRACT | Status: DC
  Administered 2023-02-23 – 2023-03-04 (×13): 0.25 mg via RESPIRATORY_TRACT
  Filled 2023-02-23 (×16): qty 2

## 2023-02-23 MED ORDER — SODIUM CHLORIDE 0.9 % IV SOLN: 250.0000 mL | INTRAVENOUS | Status: AC | PRN

## 2023-02-23 MED ORDER — FERROUS SULFATE 325 (65 FE) MG PO TABS
325.0000 mg | ORAL_TABLET | Freq: Every day | ORAL | Status: DC
  Administered 2023-02-24 – 2023-03-04 (×9): 325 mg via ORAL
  Filled 2023-02-23 (×9): qty 1

## 2023-02-23 MED ORDER — SODIUM CHLORIDE 0.9% FLUSH: 3.0000 mL | Freq: Two times a day (BID) | INTRAVENOUS | Status: DC

## 2023-02-23 MED ORDER — LABETALOL HCL 5 MG/ML IV SOLN: 10.0000 mg | INTRAVENOUS | Status: AC | PRN

## 2023-02-23 MED ORDER — HEPARIN (PORCINE) IN NACL 1000-0.9 UT/500ML-% IV SOLN
INTRAVENOUS | Status: DC | PRN
  Administered 2023-02-23: 500 mL

## 2023-02-23 MED ORDER — SODIUM CHLORIDE 0.9 % IV SOLN: INTRAVENOUS | Status: AC

## 2023-02-23 MED ORDER — SODIUM CHLORIDE 0.9% FLUSH: 10.0000 mL | INTRAVENOUS | Status: DC | PRN

## 2023-02-23 MED ORDER — FLUTICASONE PROPIONATE 50 MCG/ACT NA SUSP
2.0000 | Freq: Every day | NASAL | Status: DC
Start: 2023-02-23 — End: 2023-03-04
  Administered 2023-02-23 – 2023-03-04 (×9): 2 via NASAL
  Filled 2023-02-23: qty 16

## 2023-02-23 MED ORDER — ACETAMINOPHEN 325 MG PO TABS
650.0000 mg | ORAL_TABLET | ORAL | Status: DC | PRN
  Administered 2023-02-24 – 2023-02-26 (×4): 650 mg via ORAL
  Filled 2023-02-23 (×4): qty 2

## 2023-02-23 MED ORDER — SODIUM CHLORIDE 0.9% FLUSH
3.0000 mL | Freq: Two times a day (BID) | INTRAVENOUS | Status: DC
  Administered 2023-02-23 – 2023-03-04 (×12): 3 mL via INTRAVENOUS

## 2023-02-23 MED ORDER — ALBUTEROL SULFATE (2.5 MG/3ML) 0.083% IN NEBU: 2.5000 mg | INHALATION_SOLUTION | Freq: Four times a day (QID) | RESPIRATORY_TRACT | Status: DC | PRN

## 2023-02-23 SURGICAL SUPPLY — 5 items
CATH BALLN WEDGE 5F 110CM (CATHETERS) IMPLANT
PACK CARDIAC CATHETERIZATION (CUSTOM PROCEDURE TRAY) ×2 IMPLANT
SHEATH GLIDE SLENDER 4/5FR (SHEATH) IMPLANT
TRANSDUCER W/STOPCOCK (MISCELLANEOUS) IMPLANT
TUBING ART PRESS 72 MALE/FEM (TUBING) IMPLANT

## 2023-02-23 NOTE — Interval H&P Note (Signed)
History and Physical Interval Note:  02/23/2023 2:25 PM  Nichole Johnson  has presented today for surgery, with the diagnosis of low output - chf.  The various methods of treatment have been discussed with the patient and family. After consideration of risks, benefits and other options for treatment, the patient has consented to  Procedure(s): RIGHT HEART CATH (N/A) as a surgical intervention.  The patient's history has been reviewed, patient examined, no change in status, stable for surgery.  I have reviewed the patient's chart and labs.  Questions were answered to the patient's satisfaction.     Terese Heier [employer]

## 2023-02-23 NOTE — H&P (Signed)
Advanced Heart Failure Team History and Physical Note   PCP:  [name], MD  PCP-Cardiology: Gypsy Balsam, MD     77 y.o. 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 Bing Matter.   After moving to [place], she was initially followed in [place] and at [facility]. Cardiac MRI at [facility] on 08/12/16 showed EF 26% with dilated left ventricle 6.2 cm mild to moderate mitral regurgitation. There was subendocardial hyperenhancement involving the mid-distal anterior wall, consistent with subendocardial infarction in the distribution of a diagonal branch of the LAD. Adenosine stress perfusion imaging demonstrated no evidence of inducible myocardial ischemia. Hypotension limited GDMT and she refused ICD.   In 2019, she established care with Dr. Dulce Sellar. EF 20-25 at the time. cMRI in 3/21 showed severely decreased LV function and mid-moderately decreased RV function with RVEF 34% and transmural delayed myocardial enhancement in the mid-apical LV anterior wall suggestive of prior infarct.    Overall, patient was fairly well compensated from a heart failure standpoint until March 2024. She underwent knee replacement at that time and post procedure developed SOB, weakness, fatigue, hypotension.    Echo 7/24 showed EF 30-35 with grade 1 diastolic dysfunction, normal RV, moderate MR.  She then traveled to [place] and developed COVID pneumonia and admitted to the hospital for 5 day in 7/24.    She had an admission 10/10-10/15/24 for CHF exacerbation and NSTEMI, troponin 11,645 and BNP 3090. Repeat Echo 01/23/23 with EF 25-30%, G3DD, elevated LAP, global HK, nl RVSF, nl PASP, mod MR. LHC/RHC showed mean RA 2, CI 2.86, PA 36/15, mean PWCP 13 with 80% stenosis in OM3  (?thrombotic in setting of prior COVID-19). Eliquis was started.  She was readmitted later in 10/24 for further diuresis.    After discharge from her 2nd admission in 10/24, she went to the ER on 02/19/23 with weakness and was noted to have AKI and hyperkalemia as well as elevated lactate at 2.3.  She was sent home.  Torsemide was stopped due to "dehydration."  She went back to the ER 11/10 with shortness of breath.     I initially saw patient in the office yesterday and set her up for RHC today (see below).  Patient continues to do quite poorly at home.  She feels "terrible" and very weak.  This has been progressive for around 2 months.  She is short of breath with "any activity."  She uses a walker, dyspneic just in house. Mild lightheadedness if she stands too fast.   She had not been taking torsemide or Farxiga due to lightheadedness and concern that she is "dehydrated."  She was only taking Eliquis, Toprol XL, and atorvastatin.  Sleeps with head of bed elevated.  No chest pain.  She has chronic mid back pain wrapping around to her right rib cage area.  I had her start on torsemide 10 mg daily yesterday, she actually took 20 mg due to dyspnea.  RHC today showed low cardiac index (1.63) and elevatd PCWP.  Given NYHA class IV symptoms, I am going to admit her.    Labs (11/24): K 4.3, creatinine 1.78 => 1.61, hgb 13.6, AST 81, ALT 125, BNP 2950, lactate 2.3  RHC Procedural Findings: Hemodynamics (mmHg) RA mean 4 RV 33/9 PA 32/22, mean 29 PCWP  mean 20 Oxygen saturations: PA 56% AO 100% Cardiac Output (Fick) 2.38  Cardiac Index (Fick) 1.63 PVR 3.8 WU PAPi 2.5    PMH: 1. CKD stage 3 2. COVID-19 PNA in 7/24 3. Breast cancer: Treated in 2000 with lumpectomy, XRT, and chemotherapy involving Adriamycin.  4. Chronic systolic CHF: Primarily nonischemic cardiomyopathy.  Diagnosed in 2001, though to be due to chemotheraphy (Adriamycin).   - Cardiac MRI (3/21): LV EF 20%, RV EF 34%, transmural LGE  mid-apical anterior wall, possible prior infarction.  - Echo (9/22): EF 30-35%, normal RV - Echo (7/24): EF 30-35%, normal RV, moderate MR.  - Echo (10/24): EF 25-30%, dilated LV, low normal RV function, moderate MR.  - RHC (10/24): mean RA 2, PA 36/15, mean PCWP 10, CI 2.86 5. HTN 6. Hyperlipidemia 7. OSA 8. Hypothyroidism 9. Glaucoma 10. TKR 3/24 11. CAD: LHC in 10/24 with 80% OM3 stenosis, thought to be thrombotic.   ROS: All systems reviewed and negative except as per HPI.   Home Medications Prior to Admission medications   Medication Sig Start Date End Date Taking? Authorizing Provider  alendronate (FOSAMAX) 35 MG tablet Take 35 mg by mouth once a week. 01/08/23  Yes [provider]  ALPRAZolam (XANAX) 0.25 MG tablet Take 1 tablet (0.25 mg total) by mouth every 8 (eight) hours as needed for anxiety. 02/18/23  Yes Georgeanna Lea, MD  apixaban (ELIQUIS) 5 MG TABS tablet Take 1 tablet (5 mg total) by mouth 2 (two) times daily. 02/09/23 05/10/23 Yes [place], Eric J, DO  ARNUITY ELLIPTA 100 MCG/ACT AEPB Inhale 1 puff into the lungs daily. 01/29/23 01/29/24 Yes [provider]  atorvastatin (LIPITOR) 40 MG tablet Take 1 tablet (40 mg total) by mouth daily. 02/09/23  Yes [place], Alvira Philips, DO  Biotin 5000 MCG TABS Take 1 tablet by mouth daily.   Yes [provider]  brinzolamide (AZOPT) 1 % ophthalmic suspension Place 1 drop into both eyes 2 (two) times daily.   Yes [provider]  Calcium Carbonate-Vitamin D (CALCIUM + D PO) Take 1 tablet by mouth 2 (two) times daily.    Yes [provider]  Cholecalciferol 25 MCG (1000 UT) tablet Take 1,000 Units by mouth daily.    Yes [provider]  dapagliflozin propanediol (FARXIGA) 10 MG TABS tablet Take 1 tablet (10 mg total) by mouth daily. 01/27/23  Yes Dunn, Dayna N, PA-C  ferrous sulfate 325 (65 FE) MG tablet Take 325 mg by mouth daily.   Yes [provider]  Hypromellose (GENTEAL  OP) Place 1 drop into both eyes in the morning, at noon, in the evening, and at bedtime.   Yes [provider]  latanoprost (XALATAN) 0.005 % ophthalmic solution Place 1 drop into both eyes at bedtime.   Yes [provider]  levothyroxine (SYNTHROID, LEVOTHROID) 100 MCG tablet Take 100 mcg by mouth daily. 07/20/16  Yes [provider]  Loratadine 10 MG CAPS Take 10 mg by mouth daily.   Yes [provider]  metoprolol succinate (TOPROL-XL) 25 MG 24 hr tablet Take 1 tablet (25 mg total) by mouth daily. 01/26/23  Yes Dunn, Dayna N, PA-C  Multiple Vitamins-Minerals (CENTRUM SILVER PO) Take 1 tablet by mouth every morning.   Yes [provider]  Travoprost, BAK Free, (TRAVATAN) 0.004 % SOLN ophthalmic solution Place 1 drop into both eyes at bedtime. 02/09/23  Yes [place], Eric J, DO  vitamin B-12 (CYANOCOBALAMIN) 1000 MCG tablet Take 1,000 mcg by mouth  daily.   Yes [provider]  vitamin C (ASCORBIC ACID) 500 MG tablet Take 500 mg by mouth daily.   Yes [provider]  albuterol (PROVENTIL HFA;VENTOLIN HFA) 108 (90 Base) MCG/ACT inhaler Inhale 2 puffs into the lungs every 6 (six) hours as needed for wheezing or shortness of breath. 01/01/16   Mannam, Colbert Coyer, MD  fluticasone (FLONASE) 50 MCG/ACT nasal spray Place 2 sprays into both nostrils daily. 01/01/16   Mannam, Colbert Coyer, MD  nitroGLYCERIN (NITROSTAT) 0.4 MG SL tablet Place 1 tablet (0.4 mg total) under the tongue every 5 (five) minutes as needed for chest pain (up to 3 doses). 01/26/23   Dunn, Tacey Ruiz, PA-C  potassium chloride SA (KLOR-CON M) 20 MEQ tablet Take 2 tablets (40 mEq total) by mouth 2 (two) times daily. Patient not taking: Reported on 02/22/2023 02/09/23 05/10/23  [place], Eric J, DO  torsemide (DEMADEX) 20 MG tablet Take 2 tablets (40 mg total) by mouth every morning AND 1 tablet (20 mg total) daily at 6 PM. Patient not taking: Reported on 02/22/2023 02/09/23 05/10/23  [place],  Alvira Philips, DO  torsemide (DEMADEX) 20 MG tablet Take 1 tablet (20 mg total) by mouth daily as needed (Take additional 20mg  daily (in addition to 40mg  AM dose) for shortness of breath/weight gain (total dose 60mg )). Patient not taking: Reported on 02/22/2023 02/09/23   [place], Eric J, DO    Past Surgical History: Past Surgical History:  Procedure Laterality Date   BREAST LUMPECTOMY Left 2001   CESAREAN SECTION     RIGHT/LEFT HEART CATH AND CORONARY ANGIOGRAPHY N/A 01/25/2023   Procedure: RIGHT/LEFT HEART CATH AND CORONARY ANGIOGRAPHY;  Surgeon: Yates Decamp, MD;  Location: [facility] INVASIVE CV [facility];  Service: Cardiovascular;  Laterality: N/A;   ROTATOR CUFF REPAIR Bilateral    SHOULDER CLOSED REDUCTION      Family History:  Family History  Problem Relation Age of Onset   Hypertension Mother    Heart attack Neg Hx    Stroke Neg Hx    Diabetes Neg Hx     Social History: Social History   Socioeconomic History   Marital status: Married    Spouse name: Not on file   Number of children: Not on file   Years of education: Not on file   Highest education level: Not on file  Occupational History   Not on file  Tobacco Use   Smoking status: Never    Passive exposure: Never   Smokeless tobacco: Never   Tobacco comments:    NEVER USED TOBACCO  Vaping Use   Vaping status: Never Used  Substance and Sexual Activity   Alcohol use: Yes    Alcohol/week: 0.0 standard drinks of alcohol    Comment: occ   Drug use: No   Sexual activity: Not on file  Other Topics Concern   Not on file  Social History Narrative   Not on file   Social Determinants of Health   Financial Resource Strain: Not on file  Food Insecurity: Low Risk  (02/10/2023)   Received from [facility]   Hunger Vital Sign    Worried About Running Out of Food in the Last Year: Never true    Ran Out of Food in the Last Year: Never true  Transportation Needs: No Transportation Needs (02/10/2023)   Received from [employer]    In the past 12 months, has lack of reliable transportation kept you from medical appointments, meetings, work or from getting  things needed for daily living? : No  Physical Activity: Not on file  Stress: Not on file  Social Connections: Unknown (06/27/2022)   [facility]   Social Network    Social Network: Not on file    Allergies:  Allergies  Allergen Reactions   Penicillins Itching and Other (See Comments)    Vulvovaginal Candidiasis with associated pruritis  Vaginal Infections  Vaginal Infections    Vulvovaginal Candidiasis with associated pruritis    Vulvovaginal Candidiasis with associated pruritis Vaginal Infections Vaginal Infections   Sulfamethoxazole-Trimethoprim Other (See Comments)    Makes her shaky and sweat   Tramadol Other (See Comments)    Makes her shaky and sweat    Objective:    Vital Signs:   Temp:  [97.6 F (36.4 C)] 97.6 F (36.4 C) (11/12 1218) Pulse Rate:  [84-140] 87 (11/12 1500) Resp:  [17-24] 18 (11/12 1500) BP: (85-106)/(67-74) 101/74 (11/12 1500) SpO2:  [93 %-100 %] 98 % (11/12 1500) Weight:  [51.7 kg] 51.7 kg (11/12 1218)   Filed Weights   02/23/23 1218  Weight: 51.7 kg     Physical Exam     General:  Well appearing. No respiratory difficulty HEENT: Normal Neck: Supple. JVP 8 cm. Carotids 2+ bilat; no bruits. No lymphadenopathy or thyromegaly appreciated. Cor: PMI lateral. Regular rate & rhythm. 1/6 HSM apex.  Lungs: Clear Abdomen: Soft, nontender, nondistended. No hepatosplenomegaly. No bruits or masses. Good bowel sounds. Extremities: No cyanosis, clubbing, rash, edema. Extremities are cool.  Neuro: Alert & oriented x 3, cranial nerves grossly intact. moves all 4 extremities w/o difficulty. Affect pleasant.   Telemetry   NSR 80s-90s (personally reviewed)  EKG   Personally reviewed from 11/11, NSR, inferior Qs, anteroseptal Qs, LAFB  Labs     Basic Metabolic  Panel: Recent Labs  Lab 02/18/23 1503 02/19/23 1117 02/19/23 1426 02/21/23 1519 02/23/23 1438  NA 131* 129* 129* 128* 135  136  K 5.9* 5.0 4.5 4.3 3.6  3.6  CL 93* 96* 96* 96*  --   CO2 20 21* 21* 17*  --   GLUCOSE 125* 126* 103* 145*  --   BUN 47* 54* 53* 49*  --   CREATININE 1.85* 1.82* 1.78* 1.61*  --   CALCIUM 9.9 9.2 9.1 9.6  --   MG  --  2.6*  --   --   --     Liver Function Tests: Recent Labs  Lab 02/19/23 1117 02/21/23 1519  AST 66* 81*  ALT 109* 125*  ALKPHOS 106 191*  BILITOT 1.5* 1.7*  PROT 6.7 6.9  ALBUMIN 3.6 3.8   No results for input(s): "LIPASE", "AMYLASE" in the last 168 hours. No results for input(s): "AMMONIA" in the last 168 hours.  CBC: Recent Labs  Lab 02/19/23 1117 02/21/23 1519 02/23/23 1438  WBC 7.1 7.1  --   NEUTROABS 4.5 4.7  --   HGB 13.5 13.6 13.9  14.3  HCT 41.4 41.7 41.0  42.0  MCV 81.0 81.3  --   PLT 318 286  --     Cardiac Enzymes: No results for input(s): "CKTOTAL", "CKMB", "CKMBINDEX", "TROPONINI" in the last 168 hours.  BNP: BNP (last 3 results) Recent Labs    02/13/23 1633 02/19/23 1117 02/21/23 1519  BNP 2,417.8* 3,124.7* 2,950.2*    ProBNP (last 3 results) Recent Labs    09/08/22 1308 01/18/23 1326 02/18/23 1503  PROBNP 5,154* 9,910* 24,266*     CBG: No results for  input(s): "GLUCAP" in the last 168 hours.  Coagulation Studies: No results for input(s): "LABPROT", "INR" in the last 72 hours.  Imaging: [place] EKG SITE RITE  Result Date: 02/23/2023 If Site Rite image not attached, placement could not be confirmed due to current cardiac rhythm.  CARDIAC CATHETERIZATION  Result Date: 02/23/2023 1. Elevated PCWP with normal RA pressure. 2. Low cardiac output with CI 1.63. 3. PAPi preserved 2.5. Will admit for trial of milrinone with symptomatic low output HF.   DG Chest Portable 1 View  Result Date: 02/21/2023 CLINICAL DATA:  Shortness of breath. EXAM: PORTABLE CHEST 1 VIEW COMPARISON:   February 13, 2023 FINDINGS: The cardiac silhouette is mildly enlarged. There is mild calcification of the aortic arch. Mild diffusely increased interstitial lung markings are seen. Mild atelectasis is noted within the bilateral lung bases. No pleural effusion or pneumothorax is identified. A large hiatal hernia is present. Multilevel degenerative changes are seen throughout the thoracic spine. IMPRESSION: 1. Stable cardiomegaly with mildly increased interstitial lung markings. While this is likely, in part, chronic in nature, a superimposed component of interstitial edema cannot be excluded. 2. Mild bibasilar atelectasis. 3. Large hiatal hernia. Electronically Signed   By: Aram Candela M.D.   On: 02/21/2023 17:38     Assessment/Plan   1. Acute on chronic systolic CHF: With low output based on RHC today.  Primarily nonischemic cardiomyopathy.  Thought to be due to prior Adriamycin chemotherapy (chemo in 2000, cardiomyopathy diagnosed 2001).  She refused ICD in the past.  Cardiac MRI in 3/21 showed LV EF 20%, RV EF 34%, transmural LGE mid-apical anterior wall, possible prior infarction.  Most recent echo in 10/24 showed EF 25-30%, dilated LV, low normal RV function, moderate MR.  RHC in 10/24 did not show low output.  However, RHC today showed low output HF with primarily left-sided failure with mean RA pressure 4, mean PCWP 20, CI 1.63, and preserved PAPi 2.5.  Currently, patient has NYHA class IV symptoms. She had been taken off torsemide and Farxiga due to "dehydration."  Extremities are cool, lactate drawn in the ER 1 week ago was elevated at 2.3.  She has low output HF complicated by cardiorenal syndrome (last creatinine 1.61).   - I am going to place PICC to follow CVP and co-ox.  - Send lactate again.  - Will start milrinone 0.25 mcg/kg/min.   - Stop Topro XL with low output.  - Lasix 40 mg IV x 1 this evening after milrinone has been running for a couple of hours.  Diuresis will be challenging  with elevated PCWP and normal/low RA pressure.  - I will repeat echo with clinical worsening.  - Poor prognosis with low output HF.  Options may be limited here.  She is markedly symptomatic at home now so admitting to start milrinone.  If she feels much better on milrinone, could consider palliative home milrinone infusion as an option.  At 87 with small size, LVAD would be difficult. Her RV function does appear reasonable, albumin is normal, and husband is a good support person for her.  Would need to see if creatinine improves with milrinone as well.  I had a long discussion about this with patient's husband who is very involved in her care.  - Palliative care consultation.  2. CAD: Cath in 10/24 in setting of NSTEMI showed 80% OM3 stenosis, possibly a thrombotic lesion post-COVID-19 PNA.  No chest pain.   - Continue Eliquis 5 mg bid (?thrombotic coronary lesion).  -  Continue atorvastatin 40 daily.  3. CKD stage 3: Creatinine 1.61 most recently.  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 - Follow BMET closely. 4. Mitral regurgitation: At least moderate MR on last echo.  There has been some consideration for possible Mitraclip.  Consider eventual evaluation by TEE. 5. Elevated LFTs: Suspect congestive hepatopathy.   Marca Ancona, MD 02/23/2023, 3:39 PM  Advanced Heart Failure Team Pager [phone] (M-F; 7a - 5p)  Please contact [facility] for night-coverage after hours (4p -7a ) and weekends on [email or web address]

## 2023-02-23 NOTE — Progress Notes (Signed)
Peripherally Inserted Central Catheter Placement  The IV Nurse has discussed with the patient and/or persons authorized to consent for the patient, the purpose of this procedure and the potential benefits and risks involved with this procedure.  The benefits include less needle sticks, lab draws from the catheter, and the patient may be discharged home with the catheter. Risks include, but not limited to, infection, bleeding, blood clot (thrombus formation), and puncture of an artery; nerve damage and irregular heartbeat and possibility to perform a PICC exchange if needed/ordered by physician.  Alternatives to this procedure were also discussed.  Bard Power PICC patient education guide, fact sheet on infection prevention and patient information card has been provided to patient /or left at bedside. Elenore Paddy RN   PICC Placement Documentation  PICC Double Lumen 02/23/23 Left Brachial 39 cm 2 cm (Active)  Indication for Insertion or Continuance of Line Vasoactive infusions 02/23/23 1920  Exposed Catheter (cm) 2 cm 02/23/23 1920  Site Assessment Clean, Dry, Intact 02/23/23 1920  Lumen #1 Status Blood return noted;Flushed;Saline locked 02/23/23 1920  Lumen #2 Status Blood return noted;Flushed;Saline locked 02/23/23 1920  Dressing Type Transparent;Securing device 02/23/23 1920  Dressing Status Clean, Dry, Intact;Antimicrobial disc in place 02/23/23 1920  Line Care Connections checked and tightened 02/23/23 1920  Line Adjustment (NICU/IV Team Only) No 02/23/23 1920  Dressing Intervention New dressing;Adhesive placed at insertion site (IV team only) 02/23/23 1920  Dressing Change Due 03/02/23 02/23/23 1920       Christeen Douglas 02/23/2023, 7:26 PM

## 2023-02-24 ENCOUNTER — Telehealth: Payer: Self-pay | Admitting: Cardiology

## 2023-02-24 ENCOUNTER — Inpatient Hospital Stay (HOSPITAL_COMMUNITY): Payer: Medicare HMO

## 2023-02-24 ENCOUNTER — Encounter (HOSPITAL_COMMUNITY): Payer: Self-pay | Admitting: Cardiology

## 2023-02-24 DIAGNOSIS — I5021 Acute systolic (congestive) heart failure: Secondary | ICD-10-CM | POA: Diagnosis not present

## 2023-02-24 DIAGNOSIS — Z515 Encounter for palliative care: Secondary | ICD-10-CM

## 2023-02-24 DIAGNOSIS — I5022 Chronic systolic (congestive) heart failure: Secondary | ICD-10-CM | POA: Diagnosis not present

## 2023-02-24 DIAGNOSIS — I5023 Acute on chronic systolic (congestive) heart failure: Secondary | ICD-10-CM | POA: Diagnosis not present

## 2023-02-24 LAB — COOXEMETRY PANEL
Carboxyhemoglobin: 1.8 % — ABNORMAL HIGH (ref 0.5–1.5)
Methemoglobin: <0.7 % (ref 0.0–1.5)
O2 Saturation: 64.8 %
Total hemoglobin: 12.2 g/dL (ref 12.0–16.0)

## 2023-02-24 LAB — BASIC METABOLIC PANEL
Anion gap: 11 (ref 5–15)
Anion gap: 11 (ref 5–15)
BUN: 41 mg/dL — ABNORMAL HIGH (ref 8–23)
BUN: 38 mg/dL — ABNORMAL HIGH (ref 8–23)
CO2: 25 mmol/L (ref 22–32)
CO2: 24 mmol/L (ref 22–32)
Calcium: 8.8 mg/dL — ABNORMAL LOW (ref 8.9–10.3)
Calcium: 8.7 mg/dL — ABNORMAL LOW (ref 8.9–10.3)
Chloride: 101 mmol/L (ref 98–111)
Chloride: 99 mmol/L (ref 98–111)
Creatinine, Ser: 1.83 mg/dL — ABNORMAL HIGH (ref 0.44–1.00)
Creatinine, Ser: 1.84 mg/dL — ABNORMAL HIGH (ref 0.44–1.00)
GFR, Estimated: 28 mL/min — ABNORMAL LOW (ref >60)
GFR, Estimated: 28 mL/min — ABNORMAL LOW (ref >60)
Glucose, Bld: 89 mg/dL (ref 70–99)
Glucose, Bld: 163 mg/dL — ABNORMAL HIGH (ref 70–99)
Potassium: 2.6 mmol/L — CL (ref 3.5–5.1)
Potassium: 3.3 mmol/L — ABNORMAL LOW (ref 3.5–5.1)
Sodium: 137 mmol/L (ref 135–145)
Sodium: 134 mmol/L — ABNORMAL LOW (ref 135–145)

## 2023-02-24 LAB — ECHOCARDIOGRAM COMPLETE
Height: 59 in
MV M vel: 4.2 m/s
MV Peak grad: 70.6 mm[Hg]
Radius: 0.6 cm
S' Lateral: 5 cm
Weight: 1816.59 [oz_av]

## 2023-02-24 LAB — LACTIC ACID, PLASMA: Lactic Acid, Venous: 1.6 mmol/L (ref 0.5–1.9)

## 2023-02-24 MED ORDER — POTASSIUM CHLORIDE CRYS ER 20 MEQ PO TBCR
20.0000 meq | EXTENDED_RELEASE_TABLET | Freq: Once | ORAL | Status: AC
  Administered 2023-02-24: 20 meq via ORAL
  Filled 2023-02-24: qty 1

## 2023-02-24 MED ORDER — POTASSIUM CHLORIDE CRYS ER 20 MEQ PO TBCR
40.0000 meq | EXTENDED_RELEASE_TABLET | Freq: Once | ORAL | Status: AC
  Administered 2023-02-24: 40 meq via ORAL
  Filled 2023-02-24: qty 2

## 2023-02-24 NOTE — TOC Initial Note (Signed)
Transition of Care [facility]) - Initial/Assessment Note    Patient Details  Name: Nichole Johnson MRN: [id number] Date of Birth: [birth year]  Transition of Care [facility]) CM/SW Contact:    Nicanor Bake Phone Number: [phone] 02/24/2023, 12:12 PM  Clinical Narrative:  HF CSW met with pt and husband, Minerva Areola at bedside.  Pt and husband live together with one of their adult sons. Pt stated that she has current [facility] services once a week for one hour a day with [facility]. Pt stated that she uses a walker. Pt stated that they have a scale at home. Pt stated that she last  seen her PCP on 11/4. CSW stated that a hospital follow up may or may not be scheduled and will check with the HF team to confirm if an appointment needs to be scheduled of not.   TOC will continue following.                Expected Discharge Plan: Home w Home Health Services Barriers to Discharge: Continued Medical Work up   Patient Goals and CMS Choice Patient states their goals for this hospitalization and ongoing recovery are:: return home          Expected Discharge Plan and Services     Post Acute Care Choice: Home Health ([facility]) Living arrangements for the past 2 months: Single Family Home Expected Discharge Date: 02/26/23                                    Prior Living Arrangements/Services Living arrangements for the past 2 months: Single Family Home Lives with:: Spouse, Adult Children Patient language and need for interpreter reviewed:: Yes Do you feel safe going back to the place where you live?: Yes      Need for Family Participation in Patient Care: No (Comment) Care giver support system in place?: Yes (comment)   Criminal Activity/Legal Involvement Pertinent to Current Situation/Hospitalization: No - Comment as needed  Activities of Daily Living   ADL Screening (condition at time of admission) Independently performs ADLs?: Yes (appropriate for developmental age) Does the patient  have a NEW difficulty with bathing/dressing/toileting/self-feeding that is expected to last >3 days?: No Does the patient have a NEW difficulty with getting in/out of bed, walking, or climbing stairs that is expected to last >3 days?: No Does the patient have a NEW difficulty with communication that is expected to last >3 days?: No Is the patient deaf or have difficulty hearing?: No Does the patient have difficulty seeing, even when wearing glasses/contacts?: No Does the patient have difficulty concentrating, remembering, or making decisions?: No  Permission Sought/Granted                  Emotional Assessment Appearance:: Appears stated age Attitude/Demeanor/Rapport: Engaged Affect (typically observed): Appropriate Orientation: : Oriented to Self, Oriented to Place, Oriented to  Time, Oriented to Situation Alcohol / Substance Use: Never Used Psych Involvement: No (comment)  Admission diagnosis:  CHF (congestive heart failure) (HCC) [I50.9] Patient Active Problem List   Diagnosis Date Noted   CHF (congestive heart failure) (HCC) 02/23/2023   CAD (coronary artery disease) 01/26/2023   Non-ST elevation (NSTEMI) myocardial infarction (HCC) 01/26/2023   Acute on chronic systolic (congestive) heart failure (HCC) 01/22/2023   Heart failure (HCC) 01/21/2023   Impaired functional mobility, balance, gait, and endurance 08/07/2022   Anterolisthesis of cervical spine 04/01/2021   Stage 3a chronic  kidney disease (HCC) 04/01/2021   Morton's neuroma of right foot 08/13/2020   Hypertension    High cholesterol    Cancer (HCC)    History of breast cancer 04/23/2020   Osteopenia of hip 04/21/2020   Osteoarthritis of left knee 08/17/2019   Hiatal hernia 07/20/2019   Bilateral hand pain 07/26/2018   Trigger finger of left hand 07/26/2018   Dilated cardiomyopathy (HCC) 04/10/2018   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)  04/10/2018   AKI (acute kidney injury) (HCC) 11/03/2017   Glaucoma 10/14/2017   Reactive airway disease without complication 10/14/2017   S/P total knee arthroplasty, 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   Dyspepsia 01/18/2017   Dysplastic nevus 12/02/2016   Benign hypertension 07/08/2016   Obstructive sleep apnea (adult) (pediatric) 07/08/2016   Acute renal insufficiency 06/09/2016   Hypotension 06/09/2016   Acute on chronic systolic congestive heart failure (HCC) 05/19/2016   Irritable bowel syndrome without diarrhea 12/10/2014   Hyperlipidemia 04/18/2014   Anemia 04/18/2014   Hypothyroidism 04/18/2014   Mild diastolic dysfunction 04/18/2014   Seasonal allergies 02/26/2014   Personal history of radiation therapy 2001   Breast cancer (HCC) 2001   PCP:  [name], MD Pharmacy:   [facility] [address] [place], [place] - [address] [place] [address] Phone: [phone] Fax: [phone]  [facility] - [place], [place] - [address] [place] [address] Phone: [phone] Fax: [phone]  [facility] [address] [place] [address] Phone: [phone] Fax: [phone]     Social Determinants of Health (SDOH) Social History: SDOH Screenings   Food Insecurity: No Food Insecurity (02/23/2023)  Housing: Low Risk  (02/23/2023)  Transportation Needs: No Transportation Needs (02/23/2023)  Utilities: Not At Risk (02/23/2023)  Social Connections: Unknown (06/27/2022)   Received from [facility]  Tobacco Use: Low Risk  (02/22/2023)   SDOH Interventions:     Readmission Risk Interventions     No data to display

## 2023-02-24 NOTE — Progress Notes (Signed)
MCS EDUCATION NOTE:                Initial VAD teaching completed with pt and caregiver.   VAD educational packet including "Understanding Your Options with Advanced Heart Failure", "[place] Patient Agreement for VAD Evaluation and Potential Implantation" consent, and [employer] "Heartmate 3 Left Ventricular Device (LVAD) Patient Guide", Heartmate 3 Left Ventricular Assist System Patient Education Program DVD", "[place] HM III Patient Education", "[place] Mechanical Circulatory Support Program", and "Decision Aids for Left Ventricular Assist Device" reviewed in detail and left at bedside for continued reference.  All questions answered regarding VAD implant, hospital stay, and what to expect when discharged home living with a heart pump. Explained need for 24/7 care when pt is discharged home due to sternal precautions, adaptation to living on support, emotional support, consistent and meticulous exit site care and management, medication adherence and high volume of follow up visits with the VAD Clinic after discharge; both pt and caregiver verbalized understanding of above.   Explained that LVAD can be implanted for two indications in the setting of advanced left ventricular heart failure treatment:  Bridge to transplant - used for patients who cannot safely wait for heart transplant without this device.  Or    Destination therapy - used for patients until end of life or recovery of heart function.  Patient and caregiver(s) acknowledge that the indication at this point in time for LVAD therapy would be for destination therapy due to age.   Provided brief equipment overview and demonstration with HeartMate III training loop including discussion on the following:   a) mobile power unit b) system controller   c) universal Magazine features editor   d) battery clips   e) Batteries   f)  Perc lock   g) Percutaneous lead   Demonstrated and discussed:  a) changing power source on system controller  from tethered (MPU) to untethered (battery) mode   b) changing power source on system controller from untethered (battery) to tethered (MPU) mode   c) how to monitor battery life both on the system controller and on each individual battery   d) changing batteries   Identified the following lifestyle modifications while living on MCS:    1. No driving for at least three months and then only if doctor gives permission to do so.   2. No tub baths while pump implanted, and shower only when doctor gives permission.   3. No swimming or submersion in water while implanted with pump.   4. No contact sports or engaging in jumping activities.   5. Always have a backup controller, charged spare batteries, and battery clips nearby at all times in case of emergency.   6. Call the doctor or hospital contact person if any change in how the pump sounds, feels, or works.   7. Plan to sleep only when connected to the power module.   8. Do not sleep on your stomach.   9. Keep a backup system controller, charged batteries, battery clips, and flashlight near you during sleep in case of electrical power outage.   10. Exit site care including dressing changes, monitoring for infection, and importance of keeping percutaneous lead stabilized at all times.    Extended the option to have one of our current patients and caregiver(s) come to talk with them about living on support to assist with decision making.   Reviewed pictures of VAD drive line, site care, dressing changes, and drive line stabilization including securement attachment  device and abdominal binder. Discussed with pt and family that they will be required to purchase dressing supplies as long as patient has the VAD in place.   Intermacs patient survival statistics through June 2024 reviewed with patient and caregiver as follows:  The patient understands that from this discussion it does not mean that they will receive the device, but that depends on an  extensive evaluation process. The patient is aware of the fact that if at anytime they want to stop the evaluation process they can. All questions have been answered at this time and contact information was provided should they encounter any further questions. Pt's husband very curious about if noninvasive options are available for treatment such as Barostim. Encouraged pt and husband to have more in depth conversation with heart failure rounding team to discuss all options. Pt and husband wish to wait to evaluation process until further information is gathered from rounding teamVAD Coordinator to follow up with pt and husband tomorrow.   Simmie Davies RN,BSN VAD Coordinator   Office: [phone] 24/7 VAD Pager: [phone]

## 2023-02-24 NOTE — Progress Notes (Signed)
Echocardiogram 2D Echocardiogram has been performed.  Nichole Johnson Sole Lengacher  RDCS 02/24/2023, 8:55 AM

## 2023-02-24 NOTE — Consult Note (Signed)
Consultation Note Date: 02/24/2023 at 1200  Patient Name: Nichole Johnson  DOB: [birth year]  MRN: [id number]  Age / Sex: 77 y.o., female  PCP: [name], MD Referring Physician: Laurey Morale, MD  HPI/Patient Profile: 77 y.o. female  with past medical history of primarily nonischemic cardiomyopathy (diagnosed 2001 after Adriamycin treatment for breast cancer), CAD s/p NSTEMI, breast cancer s/p lumpectomy/chemo (adriamycin) and XRT in 2000, HL, HTN, restrictive lung disease, OSA, CKD IIIb, hypothyroidism, and glaucoma admitted on 02/23/2023 with SOB. Patient endorsed progressively feeling poorly over the last 2 months.    HF team following. Course of hospitalization includes RHC revealing low output HF complicated by cardiorenal syndrome (CI 1.63). PLan is for home milrinone gtt or LVAD pending discussions.   PMT was consulted to discuss GOC.   Clinical Assessment and Goals of Care: Extensive chart review completed prior to meeting patient including labs, vital signs, imaging, progress notes, orders, and available advanced directive documents from current and previous encounters. I then met with patient, her husband, and her 2 sons at bedside to discuss diagnosis prognosis, GOC, EOL wishes, disposition and options.  I introduced Palliative Medicine as specialized medical care for people living with serious illness. It focuses on providing relief from the symptoms and stress of a serious illness. The goal is to improve quality of life for both the patient and the family.  We discussed a brief life review of the patient.  Patient is married for 53 years.  They are originally from [place] but have lived in [place] for the last 20+ years.  1 son lives part-time in [place] in [place] and the other son lives locally.  As far as functional and nutritional status PTA patient endorsed that she was independent with all  ADLs.  She endorses this weakness has significantly increased over the past 2 months.  She shares she feels surprised and very overwhelmed by all of the health problems that she has been told she has over the past few weeks.  We discussed patient's current illness acute on chronic systolic CHF, CAD, mitral regurgitation, and CKD - and what it means in the larger context of patient's on-going co-morbidities.  Discussed patient's functional, nutritional, and cognitive status is significant indicators of her overall prognosis.  Specifically, discussed that her functional status is limited by her low cardiac output.  GDMT is limited by BP and renal function.  I attempted to elicit patient's understanding of her current medical situation as well as values and goals of care important to the patient.  She says that she has a weak heart and that her options are "not good".  She says she has been offered an LVAD as an option as well as continuing with the milrinone drip.  She shares she does not want to pursue any invasive procedures and believes she would not be accepting of an LVAD even if she was a candidate.  Her husband shares concern that he would like to know what other options she has as far as devices  or noninvasive procedures that can help her.  They have discussed these options with Dr. Augustina Mood.  Patient shares that she just feels overwhelmed at the moment and needs time to process everything she is heard.  Advance directives, CODE STATUS, and rehospitalization discussed with patient and family.  Patient has not craeted a healthcare power of attorney, living will, or advanced directive to outline her wishes.    I discussed the importance of putting boundaries in place as far as what medical care patient would be accepting of.  She again endorses that she would never want to have open heart surgery or to be put under any sort of large procedure.  I discussed the difference between full code and DNR  status.  Discussed use of ventilatory support as well as ACLS protocol in the event of a cardiopulmonary arrest.  Patient again shares that she has never thought about this and it is just overwhelming for her.  I said there is nothing urgent about making these decisions but that it is important to discuss them in advance of an urgent or emergent situation.  Space and opportunity offered for patient and family to ask questions.  Patient's husband shares he would like to get more information when she is home about what her next steps can be.  Therapeutic silence, active listening, and emotional support provided.  Patient was appreciative of my visit and agreed for me to return to continue goals of care discussions tomorrow.  PMT will continue to follow and support patient and family throughout her hospitalization.  Primary Decision Maker PATIENT  Physical Exam Vitals reviewed.  HENT:     Head: Normocephalic.     Nose: Nose normal.     Mouth/Throat:     Mouth: Mucous membranes are moist.  Eyes:     Pupils: Pupils are equal, round, and reactive to light.  Cardiovascular:     Rate and Rhythm: Normal rate.     Pulses: Normal pulses.  Pulmonary:     Effort: Pulmonary effort is normal.  Abdominal:     Palpations: Abdomen is soft.  Musculoskeletal:        General: Normal range of motion.     Comments: MAETC, generalized weakness  Skin:    General: Skin is warm and dry.  Neurological:     Mental Status: She is oriented to person, place, and time.  Psychiatric:        Mood and Affect: Mood normal.        Behavior: Behavior normal.        Thought Content: Thought content normal.        Judgment: Judgment normal.     Palliative Assessment/Data: 60%     Thank you for this consult. Palliative medicine will continue to follow and assist holistically.   Time Total: 75 minutes  Time spent includes: Detailed review of medical records (labs, imaging, vital signs), medically  appropriate exam (mental status, respiratory, cardiac, skin), discussed with treatment team, counseling and educating patient, family and staff, documenting clinical information, medication management and coordination of care.  Signed by: Georgiann Cocker, DNP, FNP-BC Palliative Medicine   Please contact Palliative Medicine Team providers via [facility] for questions and concerns.

## 2023-02-24 NOTE — TOC Initial Note (Signed)
Transition of Care [facility]) - Initial/Assessment Note    Patient Details  Name: Nichole Johnson MRN: [id number] Date of Birth: Apr 09, 1946  Transition [facility] Care [facility]) CM/SW Contact:    Elliot Cousin, RN Phone Number: [phone] 02/24/2023, 3:23 PM  Clinical Narrative:  30 Readmission TOC CM spoke to pt and husband at bedside.  Active with [facility]. Has scale at home for daily weights, rollator, and bedside commode. Contacted [facility] rep, Tresa Endo to make aware.  Husband and sons at home to assist with transportation to appts.  Will need HH orders RN, PT, OT orders with [facility]2F at dc.                  Expected Discharge Plan: Home w Home Health Services Barriers to Discharge: Continued Medical Work up   Patient Goals and CMS Choice Patient states their goals for this hospitalization and ongoing recovery are:: return home CMS Medicare.gov Compare Post Acute Care list provided to:: Patient Choice offered to / list presented to : Patient      Expected Discharge Plan and Services   Discharge Planning Services: CM Consult Post Acute Care Choice: Home Health Living arrangements for the past 2 months: Single Family Home Expected Discharge Date: 02/26/23                         HH Arranged: RN, PT, OT HH Agency: [facility] Date [facility] Agency Contacted: 02/24/23 Time HH Agency Contacted: 1515 Representative spoke with at [facility] Agency: Hassel Neth  Prior Living Arrangements/Services Living arrangements for the past 2 months: Single Family Home Lives with:: Adult Children, Spouse Patient language and need for interpreter reviewed:: Yes Do you feel safe going back to the place where you live?: Yes      Need for Family Participation in Patient Care: No (Comment) Care giver support system in place?: Yes (comment) Current home services: DME (Rollator, bedside commode, scale) Criminal Activity/Legal Involvement Pertinent to Current Situation/Hospitalization: No - Comment as  needed  Activities of Daily Living   ADL Screening (condition at time of admission) Independently performs ADLs?: Yes (appropriate for developmental age) Does the patient have a NEW difficulty with bathing/dressing/toileting/self-feeding that is expected to last >3 days?: No Does the patient have a NEW difficulty with getting in/out of bed, walking, or climbing stairs that is expected to last >3 days?: No Does the patient have a NEW difficulty with communication that is expected to last >3 days?: No Is the patient deaf or have difficulty hearing?: No Does the patient have difficulty seeing, even when wearing glasses/contacts?: No Does the patient have difficulty concentrating, remembering, or making decisions?: No  Permission Sought/Granted Permission sought to share information with : Case Manager, Family Supports, PCP Permission granted to share information with : Yes, Verbal Permission Granted  Share Information with NAME: Audrae Pranke  Permission granted to share info w AGENCY: Home Health  Permission granted to share info w Relationship: husband  Permission granted to share info w Contact Information: [phone]  Emotional Assessment Appearance:: Appears stated age Attitude/Demeanor/Rapport: Engaged Affect (typically observed): Accepting Orientation: : Oriented to Self, Oriented to Place, Oriented to  Time, Oriented to Situation Alcohol / Substance Use: Never Used Psych Involvement: No (comment)  Admission diagnosis:  CHF (congestive heart failure) (HCC) [I50.9] Patient Active Problem List   Diagnosis Date Noted   CHF (congestive heart failure) (HCC) 02/23/2023   CAD (coronary artery disease) 01/26/2023   Non-ST elevation (  NSTEMI) myocardial infarction (HCC) 01/26/2023   Acute on chronic systolic (congestive) heart failure (HCC) 01/22/2023   Heart failure (HCC) 01/21/2023   Impaired functional mobility, balance, gait, and endurance 08/07/2022   Anterolisthesis of  cervical spine 04/01/2021   Stage 3a chronic kidney disease (HCC) 04/01/2021   Morton's neuroma of right foot 08/13/2020   Hypertension    High cholesterol    Cancer (HCC)    History of breast cancer 04/23/2020   Osteopenia of hip 04/21/2020   Osteoarthritis of left knee 08/17/2019   Hiatal hernia 07/20/2019   Bilateral hand pain 07/26/2018   Trigger finger of left hand 07/26/2018   Dilated cardiomyopathy (HCC) 04/10/2018   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   AKI (acute kidney injury) (HCC) 11/03/2017   Glaucoma 10/14/2017   Reactive airway disease without complication 10/14/2017   S/P total knee arthroplasty, 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   Dyspepsia 01/18/2017   Dysplastic nevus 12/02/2016   Benign hypertension 07/08/2016   Obstructive sleep apnea (adult) (pediatric) 07/08/2016   Acute renal insufficiency 06/09/2016   Hypotension 06/09/2016   Acute on chronic systolic congestive heart failure (HCC) 05/19/2016   Irritable bowel syndrome without diarrhea 12/10/2014   Hyperlipidemia 04/18/2014   Anemia 04/18/2014   Hypothyroidism 04/18/2014   Mild diastolic dysfunction 04/18/2014   Seasonal allergies 02/26/2014   Personal history of radiation therapy 2001   Breast cancer (HCC) 2001   PCP:  [name], MD Pharmacy:   [facility] [address] [place], [place] - [address] [place] [address] Phone: [phone] Fax: [phone]  [facility] - [place], [place] - [address] [place] [address] Phone: [phone] Fax: [phone]  [facility] [address] [place] [address] Phone: [phone] Fax: [phone]     Social Determinants of Health (SDOH) Social History: SDOH Screenings   Food Insecurity: No Food Insecurity (02/23/2023)  Housing: Low Risk  (02/23/2023)   Transportation Needs: No Transportation Needs (02/23/2023)  Utilities: Not At Risk (02/23/2023)  Social Connections: Unknown (06/27/2022)   Received from [facility]  Tobacco Use: Low Risk  (02/22/2023)   SDOH Interventions:     Readmission Risk Interventions     No data to display

## 2023-02-24 NOTE — Progress Notes (Signed)
Heart Failure Navigator Progress Note  Assessed for Heart & Vascular TOC clinic readiness.  Patient does not meet criteria due to Advanced Heart Failure patient of Dr. Shirlee Latch.   Navigator will sign off at this time.   Rhae Hammock, BSN, Scientist, clinical (histocompatibility and immunogenetics) Only

## 2023-02-24 NOTE — Telephone Encounter (Signed)
I spoke with patient's husband.  He is calling regarding patient possibly have a device placed.  Patient is currently in the hospital and husband is there with her.  I asked him to talk with patient's nurse and have them get a message to MD or APP who is covering the hospital

## 2023-02-24 NOTE — Telephone Encounter (Signed)
Pt's husband would like a c/b regarding Device Implant. Please advise

## 2023-02-24 NOTE — Telephone Encounter (Signed)
Called pt and spoke with pt/husband and son who states they are requesting Dr. Lovena Neighbours office to return their call for information regarding a device.

## 2023-02-24 NOTE — Progress Notes (Addendum)
Advanced Heart Failure Rounding Note  PCP-Cardiologist: Gypsy Balsam, MD   Subjective:    CO-OX 65% on milrinone 0.25.  CVP 5 after 40 mg IV lasix last night.  Scr 1.81>1.83.    K 2.6  No dyspnea at rest and when up to the bathroom.    Objective:   Weight Range: 51.5 kg Body mass index is 22.93 kg/m.   Vital Signs:   Temp:  [97.4 F (36.3 C)-97.6 F (36.4 C)] 97.4 F (36.3 C) (11/13 0727) Pulse Rate:  [83-140] 83 (11/13 0738) Resp:  [17-24] 18 (11/13 0738) BP: (85-106)/(53-81) 89/60 (11/13 0727) SpO2:  [73 %-100 %] 98 % (11/13 0738) Weight:  [51.5 kg-54 kg] 51.5 kg (11/13 0424) Last BM Date : 02/22/23  Weight change: Filed Weights   02/23/23 1218 02/23/23 1807 02/24/23 0424  Weight: 51.7 kg 54 kg 51.5 kg    Intake/Output:   Intake/Output Summary (Last 24 hours) at 02/24/2023 1114 Last data filed at 02/24/2023 0900 Gross per 24 hour  Intake 720 ml  Output 2200 ml  Net -1480 ml      Physical Exam  CVP 5 General:  Fatigued appearing. HEENT: Normal Neck: Supple. JVP not elevated .  Cor: PMI nondisplaced. Regular rate & rhythm. No rubs, gallops or murmurs. Lungs: Clear Abdomen: Soft, nontender, nondistended.  Extremities: No cyanosis, clubbing, rash, edema, left upper extremity PICC Neuro: Alert & orientedx3. moves all 4 extremities w/o difficulty. Affect pleasant   Telemetry   SR 80s-90s, 5 beat run NSVT  Labs    CBC Recent Labs    02/21/23 1519 02/23/23 1438 02/23/23 2007  WBC 7.1  --  6.2  NEUTROABS 4.7  --   --   HGB 13.6 13.9  14.3 12.4  HCT 41.7 41.0  42.0 38.0  MCV 81.3  --  80.9  PLT 286  --  264   Basic Metabolic Panel Recent Labs    04/14/70 2007 02/24/23 0500  NA 133* 137  K 3.4* 2.6*  CL 99 101  CO2 22 25  GLUCOSE 108* 89  BUN 45* 41*  CREATININE 1.81* 1.83*  CALCIUM 9.1 8.8*  MG 2.3  --    Liver Function Tests Recent Labs    02/21/23 1519 02/23/23 2007  AST 81* 68*  ALT 125* 115*  ALKPHOS 191*  171*  BILITOT 1.7* 1.2*  PROT 6.9 6.1*  ALBUMIN 3.8 3.1*   No results for input(s): "LIPASE", "AMYLASE" in the last 72 hours. Cardiac Enzymes No results for input(s): "CKTOTAL", "CKMB", "CKMBINDEX", "TROPONINI" in the last 72 hours.  BNP: BNP (last 3 results) Recent Labs    02/19/23 1117 02/21/23 1519 02/23/23 2007  BNP 3,124.7* 2,950.2* 4,190.1*    ProBNP (last 3 results) Recent Labs    09/08/22 1308 01/18/23 1326 02/18/23 1503  PROBNP 5,154* 9,910* 24,266*     D-Dimer No results for input(s): "DDIMER" in the last 72 hours. Hemoglobin A1C No results for input(s): "HGBA1C" in the last 72 hours. Fasting Lipid Panel No results for input(s): "CHOL", "HDL", "LDLCALC", "TRIG", "CHOLHDL", "LDLDIRECT" in the last 72 hours. Thyroid Function Tests Recent Labs    02/23/23 2007  TSH 1.717    Other results:   Imaging    ECHOCARDIOGRAM COMPLETE  Result Date: 02/24/2023    ECHOCARDIOGRAM REPORT   Patient Name:   Nichole Johnson Date of Exam: 02/24/2023 Medical Rec #:  [id number]    Height:       59.0 in Accession #:  [id number]   Weight:       113.5 lb Date of Birth:  1945/10/29    BSA:          1.450 m Patient Age:    77 years     BP:           89/60 mmHg Patient Gender: F            HR:           95 bpm. Exam Location:  Inpatient Procedure: 2D Echo, Color Doppler and Cardiac Doppler Indications:    I50.21 Acute systolic (congestive) heart failure  History:        Patient has prior history of Echocardiogram examinations, most                 recent 01/23/2023. CHF, CAD; Risk Factors:Hypertension,                 Dyslipidemia and Sleep Apnea.  Sonographer:    Irving Burton Senior RDCS Referring Phys: [phone] [facility]  Sonographer Comments: Apicals limited by small rib spaces IMPRESSIONS  1. LVEF 25-30% with global HK. Apex not well visualized, would consider contrast study to exclude LV thrombus. Left ventricular ejection fraction, by estimation, is 25 to 30%. The left ventricle has  severely decreased function. The left ventricle demonstrates global hypokinesis. The left ventricular internal cavity size was mildly dilated. Indeterminate diastolic filling due to E-A fusion.  2. Right ventricular systolic function is normal. The right ventricular size is normal. Tricuspid regurgitation signal is inadequate for assessing PA pressure.  3. Left atrial size was mild to moderately dilated.  4. Right atrial size was mildly dilated.  5. The mitral valve is abnormal. Moderate to severe mitral valve regurgitation. No evidence of mitral stenosis.  6. The aortic valve is tricuspid. Aortic valve regurgitation is not visualized. No aortic stenosis is present.  7. The inferior vena cava is normal in size with greater than 50% respiratory variability, suggesting right atrial pressure of 3 mmHg. Comparison(s): No significant change from prior study. FINDINGS  Left Ventricle: LVEF 25-30% with global HK. Apex not well visualized, would consider contrast study to exclude LV thrombus. Left ventricular ejection fraction, by estimation, is 25 to 30%. The left ventricle has severely decreased function. The left ventricle demonstrates global hypokinesis. The left ventricular internal cavity size was mildly dilated. There is no left ventricular hypertrophy. Indeterminate diastolic filling due to E-A fusion. Right Ventricle: The right ventricular size is normal. No increase in right ventricular wall thickness. Right ventricular systolic function is normal. Tricuspid regurgitation signal is inadequate for assessing PA pressure. Left Atrium: Left atrial size was mild to moderately dilated. Right Atrium: Right atrial size was mildly dilated. Pericardium: There is no evidence of pericardial effusion. Mitral Valve: The mitral valve is abnormal. Moderate to severe mitral valve regurgitation. No evidence of mitral valve stenosis. Tricuspid Valve: The tricuspid valve is grossly normal. Tricuspid valve regurgitation is trivial. No  evidence of tricuspid stenosis. Aortic Valve: The aortic valve is tricuspid. Aortic valve regurgitation is not visualized. No aortic stenosis is present. Pulmonic Valve: The pulmonic valve was grossly normal. Pulmonic valve regurgitation is trivial. No evidence of pulmonic stenosis. Aorta: The aortic root and ascending aorta are structurally normal, with no evidence of dilitation. Venous: The inferior vena cava is normal in size with greater than 50% respiratory variability, suggesting right atrial pressure of 3 mmHg. IAS/Shunts: The atrial septum is grossly normal.  LEFT VENTRICLE PLAX 2D LVIDd:  5.50 cm LVIDs:         5.00 cm LV PW:         0.60 cm LV IVS:        0.60 cm LVOT diam:     1.90 cm LV SV:         24 LV SV Index:   16 LVOT Area:     2.84 cm  RIGHT VENTRICLE RV S prime:     9.39 cm/s TAPSE (M-mode): 2.0 cm LEFT ATRIUM             Index        RIGHT ATRIUM           Index LA diam:        3.90 cm 2.69 cm/m   RA Area:     19.00 cm LA Vol (A2C):   78.6 ml 54.21 ml/m  RA Volume:   54.50 ml  37.59 ml/m LA Vol (A4C):   45.8 ml 31.59 ml/m LA Biplane Vol: 60.2 ml 41.52 ml/m  AORTIC VALVE LVOT Vmax:   62.00 cm/s LVOT Vmean:  40.500 cm/s LVOT VTI:    0.084 m  AORTA Ao Root diam: 2.80 cm Ao Asc diam:  2.60 cm MR Peak grad:    70.6 mmHg MR Mean grad:    47.0 mmHg    SHUNTS MR Vmax:         420.00 cm/s  Systemic VTI:  0.08 m MR Vmean:        322.0 cm/s   Systemic Diam: 1.90 cm MR PISA:         2.26 cm MR PISA Eff ROA: 21 mm MR PISA Radius:  0.60 cm Lennie Odor MD Electronically signed by Lennie Odor MD Signature Date/Time: 02/24/2023/10:22:14 AM    Final    [place] EKG SITE RITE  Result Date: 02/23/2023 If Site Rite image not attached, placement could not be confirmed due to current cardiac rhythm.  CARDIAC CATHETERIZATION  Result Date: 02/23/2023 1. Elevated PCWP with normal RA pressure. 2. Low cardiac output with CI 1.63. 3. PAPi preserved 2.5. Will admit for trial of milrinone with  symptomatic low output HF.     Medications:     Scheduled Medications:  apixaban  5 mg Oral BID   atorvastatin  40 mg Oral Daily   brinzolamide  1 drop Both Eyes BID   budesonide  0.25 mg Nebulization BID   Chlorhexidine Gluconate Cloth  6 each Topical Daily   cyanocobalamin  1,000 mcg Oral Daily   dapagliflozin propanediol  10 mg Oral Daily   ferrous sulfate  325 mg Oral Daily   fluticasone  2 spray Each Nare Daily   latanoprost  1 drop Both Eyes QHS   levothyroxine  100 mcg Oral Daily   potassium chloride  40 mEq Oral Once   sodium chloride flush  10-40 mL Intracatheter Q12H   sodium chloride flush  3 mL Intravenous Q12H   sodium chloride flush  3 mL Intravenous Q12H    Infusions:  sodium chloride     sodium chloride     sodium chloride     milrinone 0.25 mcg/kg/min (02/23/23 2044)    PRN Medications: sodium chloride, sodium chloride, acetaminophen, albuterol, ALPRAZolam, ondansetron (ZOFRAN) IV, sodium chloride flush, sodium chloride flush, sodium chloride flush    Patient Profile   77 y.o. female with history of chronic systolic CHF/NICM, CAD, CKD IIIb, restrictive lung disease, OSA, breast cancer s/p lumpectomy/chemo (adriamycin)/XRT.  Admitted with acute  on chronic systolic CHF with low-output.  Assessment/Plan  1. Acute on chronic systolic CHF:  - Primarily nonischemic cardiomyopathy.  Thought to be due to prior Adriamycin chemotherapy (chemo in 2000, cardiomyopathy diagnosed 2001).  She refused ICD in the past.  Cardiac MRI in 3/21 showed LV EF 20%, RV EF 34%, transmural LGE mid-apical anterior wall, possible prior infarction.  Most recent echo in 10/24 showed EF 25-30%, dilated LV, low normal RV function, moderate MR.  RHC in 10/24 did not show low output.   - RHC 02/23/23 with primarily left-sided failure with mean RA pressure 4, mean PCWP 20, CI 1.63, and preserved PAPi 2.5. - Echo today: LVEF 25-30%, apex not well visualized (contrasted study recommended to  exclude LV thrombus), RV okay, moderate to severe MR -NYHA IV symptoms on admit. Lactic acid 1.6.  - Diuresis difficult with low RA pressure and elevated PCWP. CVP 5. Hold on diuretic today. - Off Toprol XL with low output.  - Currently on 10 mg Farxiga daily (stopped in past d/t weakness) - GDMT limited by BP and renal function - Poor prognosis with low output HF.  Options are limited.  If she feels much better on milrinone, could consider palliative home milrinone infusion as an option.  Not a candidate for transplant. LVAD would be very high risk at her age and small size. She also states she would not want to pursue LVAD, she wishes to avoid invasive procedures. Her husband inquired about Barostim device. She would likely have limited improvement in symptom burden with the device given her advanced heart failure. - Palliative Care following for GOC discussions. Appreciate input.  2. CAD:  - Cath in 10/24 in setting of NSTEMI showed 80% OM3 stenosis, possibly a thrombotic lesion post-COVID-19 PNA.  No chest pain.   - Continue Eliquis 5 mg bid (?thrombotic coronary lesion).  - Continue atorvastatin 40 daily.   3. CKD stage 3:  - Creatinine has been variable 1.4-1.8, most recently 1.8 - Suspect cardiorenal syndrome in setting of low output HF. - Follow BMET closely.  4. Mitral regurgitation:  - There has been some consideration for possible Mitraclip.  - Echo this admit with moderate to severe MR  5. Elevated LFTs:  - Suspect congestive hepatopathy.  - Follow - Ordered hepatic function panel  6. Hypokalemia: - K 2.6 - Aggressively supp today  Length of Stay: 1  FINCH, LINDSAY N, PA-C  02/24/2023, 11:14 AM  Advanced Heart Failure Team Pager [phone] (M-F; 7a - 5p)  Please contact [facility] for night-coverage after hours (5p -7a ) and weekends on [email or web address]  77 y/o woman with severe systolic HF due to NICM. Multiple recent admits for low output. Had outpatient RHC with  low output. Volume ok.   Now on milrinone. Feeling better Co-ox 65%  Multiple family members at bedside including husband and two sons.   K low. Being supped   General:  Slight frame Weak appearing. No resp difficulty HEENT: normal Neck: supple. no JVD. Carotids 2+ bilat; no bruits. No lymphadenopathy or thryomegaly appreciated. Cor: Regular rate & rhythm.2/6 MR Lungs: clear Abdomen: soft, nontender, nondistended. No hepatosplenomegaly. No bruits or masses. Good bowel sounds. Extremities: no cyanosis, clubbing, rash, edema Neuro: alert & orientedx3, cranial nerves grossly intact. moves all 4 extremities w/o difficulty. Affect pleasant  She has Stage D low output HF with multiple hospitalizations.   She has been seen by VAD coordinator but Ms. Balcerzak is adamant that she would not want any invasive  procedures like VAD. Given age and her size (4'11") I doubt she would have good outcome.   Family asking about multiple other options and calling/texting other sites for second opinions regarding things like BaroStim and CCM. Her HF is clearly too far along to respond well to Barostim. I also doubt that she would respond well to CCM.   Given Stage D HF I quoted them a 50% mortality rate at 1 year. Without transplant or VAD only meaningful therapy we could offer is palliative inotropes. We discussed this option and its inherent risk of arrhythmias, infection and short duration of effect.   Family has subsequently called Dr. Lovena Neighbours office asking for him to see her for CCM consultation.   I discussed the case with Dr. Shirlee Latch who agreed with above.   CRITICAL CARE Performed by: Arvilla Meres  Total critical care time: 45 minutes  Critical care time was exclusive of separately billable procedures and treating other patients.  Critical care was necessary to treat or prevent imminent or life-threatening deterioration.  Critical care was time spent personally by me (independent of  midlevel providers or residents) on the following activities: development of treatment plan with patient and/or surrogate as well as nursing, discussions with consultants, evaluation of patient's response to treatment, examination of patient, obtaining history from patient or surrogate, ordering and performing treatments and interventions, ordering and review of laboratory studies, ordering and review of radiographic studies, pulse oximetry and re-evaluation of patient's condition.  Arvilla Meres, MD  7:22 PM

## 2023-02-25 ENCOUNTER — Encounter: Payer: Self-pay | Admitting: Cardiology

## 2023-02-25 ENCOUNTER — Telehealth (HOSPITAL_COMMUNITY): Payer: Self-pay

## 2023-02-25 DIAGNOSIS — I429 Cardiomyopathy, unspecified: Secondary | ICD-10-CM

## 2023-02-25 DIAGNOSIS — I5023 Acute on chronic systolic (congestive) heart failure: Secondary | ICD-10-CM | POA: Diagnosis not present

## 2023-02-25 DIAGNOSIS — I5022 Chronic systolic (congestive) heart failure: Secondary | ICD-10-CM | POA: Diagnosis not present

## 2023-02-25 DIAGNOSIS — Z515 Encounter for palliative care: Secondary | ICD-10-CM | POA: Diagnosis not present

## 2023-02-25 LAB — BASIC METABOLIC PANEL
Anion gap: 8 (ref 5–15)
BUN: 31 mg/dL — ABNORMAL HIGH (ref 8–23)
CO2: 22 mmol/L (ref 22–32)
Calcium: 8.4 mg/dL — ABNORMAL LOW (ref 8.9–10.3)
Chloride: 102 mmol/L (ref 98–111)
Creatinine, Ser: 1.75 mg/dL — ABNORMAL HIGH (ref 0.44–1.00)
GFR, Estimated: 29 mL/min — ABNORMAL LOW (ref >60)
Glucose, Bld: 125 mg/dL — ABNORMAL HIGH (ref 70–99)
Potassium: 4 mmol/L (ref 3.5–5.1)
Sodium: 132 mmol/L — ABNORMAL LOW (ref 135–145)

## 2023-02-25 LAB — COOXEMETRY PANEL
Carboxyhemoglobin: 1.5 % (ref 0.5–1.5)
Methemoglobin: <0.7 % (ref 0.0–1.5)
O2 Saturation: 65.9 %
Total hemoglobin: 12.4 g/dL (ref 12.0–16.0)

## 2023-02-25 LAB — HEPATIC FUNCTION PANEL
ALT: 81 U/L — ABNORMAL HIGH (ref 0–44)
AST: 39 U/L (ref 15–41)
Albumin: 3 g/dL — ABNORMAL LOW (ref 3.5–5.0)
Alkaline Phosphatase: 139 U/L — ABNORMAL HIGH (ref 38–126)
Bilirubin, Direct: 0.3 mg/dL — ABNORMAL HIGH (ref 0.0–0.2)
Indirect Bilirubin: 1.3 mg/dL — ABNORMAL HIGH (ref 0.3–0.9)
Total Bilirubin: 1.6 mg/dL — ABNORMAL HIGH (ref <1.2)
Total Protein: 6.1 g/dL — ABNORMAL LOW (ref 6.5–8.1)

## 2023-02-25 NOTE — Telephone Encounter (Signed)
Received call from patients husband who states that he would like to discuss the possibility of a CCM device for patient. Patient currently admitted, advised patient's husband to speak with rounding team regarding this. Patients husband verbalized understanding.

## 2023-02-25 NOTE — Consult Note (Addendum)
ELECTROPHYSIOLOGY CONSULT NOTE    Patient ID: Giovanny Eschrich MRN: [id number], DOB/AGE: 1946-02-28 77 y.o.  Admit date: 02/23/2023 Date of Consult: 02/25/2023  Primary Physician: [name], MD Primary Cardiologist: Gypsy Balsam, MD  Electrophysiologist: New   Referring Provider: Dr. Shirlee Latch  Patient Profile: Karra Aschoff is a 77 y.o. female with a history of CHF/NICM, CAD, CKD IIIb, restrictive lung disease, OSA, and breast cancer s/p lumpectomy/chemo/adriamycin/XRT, who is being seen today for the evaluation of refractory CHF at the request of Dr. Shirlee Latch.  HPI:  Marguerette Garski is a 77 y.o. female with history above, admitted with low output CHF.   She has failed GDMT with hypotension and AKI. She has Stage D low output HF with multiple hospitalizations.   Admitted 10/10, 10/22, and on 11/12. ED visits on 11/2, 11/8, and 11/10 and has clear failed outpatient attempts to optimize her regimen.   Out 2.2 L this admission.   She has been seen by VAD coordinator but pt is adamant she would not be interested in invasive procedures such as LVAD or transplant, and good outcome is unlikely given her age and size (4'11").   Family asking about multiple other options and calling/texting other sites for second opinions regarding things like BaroStim and CCM. She is contraindicated from barostim by her BNP. Her HF is too far along to be likely to respond to CCM.   She has been tried on milrinone. Coox 65.9% this am on milrinone 0.25 mcg/kg/min.  The family has sent away several care providers trying to have goals of care conversations. The HF team has asked EP to see to clarify the patients candidacy for CCM in discussing the patients goals of care and most appropriate therapy moving forward.   Patient is feeling OK today. Denies swelling in belly. Breathing was worse before she came in and describes pre-tibial edema.  Appetite is low, but also describes the taste of the food as being poor.     Labs Potassium4.0 (11/14 0435) Magnesium  2.3 (11/12 2007) Creatinine, ser  1.75* (11/14 0435) PLT  264 (11/12 2007) HGB  12.4 (11/12 2007) WBC 6.2 (11/12 2007)  .    Allergies, Medical, Surgical, Social, and Family Histories have been reviewed and are referenced here-in when relevant for medical decision making.    Physical Exam: Vitals:   02/25/23 0032 02/25/23 0426 02/25/23 0551 02/25/23 0733  BP:  112/75  (!) 85/62  Pulse:    99  Resp: 20 18  19   Temp:  97.7 F (36.5 C)  97.8 F (36.6 C)  TempSrc:  Oral  Oral  SpO2:  99%  98%  Weight:   52.3 kg   Height:        GEN- NAD, A&O x 3, normal affect HEENT: Normocephalic, atraumatic Lungs- CTAB, Normal effort.  Heart- Regular rate and rhythm, No M/G/R.  GI- Soft, NT, ND.  Extremities- No clubbing, cyanosis, or edema   Radiology/Studies:   cMRI 07/17/2019 1. Normal left ventricular chamber size with severely reduced systolic function. LVEF 20%. 2. Normal right ventricular chamber size, mild-moderately reduced systolic function. RVEF 34%. 3. Transmural delayed myocardial enhancement mid-apical LV anterior wall, suggestive of prior infarct, with no viability in this distribution. 4.  No LV apical thrombus. 5. Moderate size hiatal hernia incidentally noted. Consider dedicated imaging to further assess.  Echo 01/23/2023 LVEF 25-30%, Grade III, low normal RV, Moderate MR  L/RHC 01/25/2023 LV 100/2, EDP 9 mmHg.  Ao 148/66, mean 95 mmHg.  No  pressure gradient across the aortic valve. RA: 3/2, mean 2 mmHg. RV 37/2, EDP 5 mmHg. PA 36/15, mean 22 mmHg.  PA saturation 64%. PW 12/12, mean 10 mmHg.  Aortic saturation 94%. QP/QS 1.00. CO 4.3, CI 2.86 by Fick.  Angiographic data: LM: Large-caliber vessel.  It smooth and normal. LAD: Large-caliber vessel, gives origin to a moderate-sized D1 and several small diagonals.  It is small and normal. LCx: Large-caliber vessel giving origin to a very tiny OM1 and OM 2 and large OM 3.   Moderate sized AV groove circumflex.  The OM 3 has a focal 80% thrombotic lesion that appears to be organized.  There is TIMI-3 flow distally. RCA: Moderate caliber vessel, dominant, smooth and normal.  RHC 02/23/2023 1. Elevated PCWP with normal RA pressure.  2. Low cardiac output with CI 1.63.  3. PAPi preserved 2.5.   Limited Echo 02/24/2023 LVEF 25-30% with global HK, Apex not well visualized cannot rule out LV thrombus, normal RV, mild/mod LAE, mild RAE, Mod/Severe MR, Trivial TR.  EKG: 11/11 showed NSR at 91 bpm, QRS 100 ms, incomplete left bundle (personally reviewed)  TELEMETRY: Sinus tachycardia 100-110s, 120-130s with minimal activity (personally reviewed)  DEVICE HISTORY:  Pt had previously refused ICD.   Assessment/Plan:  Acute on chronic systolic CHF Non-ischemic cardiomyopathy Coox better on milrnone, discussing home milrinone.  Wishes to avoid VAD or transplant consideration.  Her BNP disqualifies her from Barostim consideration.  CCM is not likely to help at this juncture and her risk for intra-cardiac procedures is prohibitive given end stage HF with concerns for low output, and indwelling PICC greatly increases risk of infection.   At this juncture, the risk of CCM therapy far outweighs the benefit, and would not be recommended given the progression of her heart failure.   Dr. Lalla Brothers has seen and agrees she is not a candidate for EP procedures.   For questions or updates, please contact [facility] Please consult [email or web address] for contact info under Cardiology/STEMI.  Dustin Flock, PA-C  02/25/2023 2:20 PM

## 2023-02-25 NOTE — Plan of Care (Signed)

## 2023-02-25 NOTE — Progress Notes (Addendum)
Advanced Heart Failure Rounding Note  PCP-Cardiologist: Gypsy Balsam, MD   Subjective:    CO-OX 66% on milrinone 0.25 mcg/kg/min  Scr stable 1.75.   Rhythm is sinus tach with rates 110s-120s, up to 130s with even light activity.  No dyspnea at rest.    Objective:   Weight Range: 52.3 kg Body mass index is 23.27 kg/m.   Vital Signs:   Temp:  [97.7 F (36.5 C)-98.1 F (36.7 C)] 97.8 F (36.6 C) (11/14 0733) Pulse Rate:  [99-117] 99 (11/14 0733) Resp:  [12-36] 19 (11/14 0733) BP: (85-112)/(61-75) 85/62 (11/14 0733) SpO2:  [97 %-99 %] 98 % (11/14 0733) Weight:  [52.3 kg] 52.3 kg (11/14 0551) Last BM Date : 02/22/23  Weight change: Filed Weights   02/23/23 1807 02/24/23 0424 02/25/23 0551  Weight: 54 kg 51.5 kg 52.3 kg    Intake/Output:   Intake/Output Summary (Last 24 hours) at 02/25/2023 1400 Last data filed at 02/25/2023 0904 Gross per 24 hour  Intake 715 ml  Output 1301 ml  Net -586 ml      Physical Exam   General:  Fatigued appearing. Neck: supple. no JVD.  Cor: Regular rate & rhythm, tachy. No rubs, gallops or murmurs. Lungs: O2 stable on RA Abdomen: soft, nontender, nondistended.  Extremities: no cyanosis, clubbing, rash, edema Neuro: alert & orientedx3. Affect pleasant    Telemetry   Sinus tach 110s-130s  Labs    CBC Recent Labs    02/23/23 1438 02/23/23 2007  WBC  --  6.2  HGB 13.9  14.3 12.4  HCT 41.0  42.0 38.0  MCV  --  80.9  PLT  --  264   Basic Metabolic Panel Recent Labs    16/10/96 2007 02/24/23 0500 02/24/23 1511 02/25/23 0435  NA 133*   < > 134* 132*  K 3.4*   < > 3.3* 4.0  CL 99   < > 99 102  CO2 22   < > 24 22  GLUCOSE 108*   < > 163* 125*  BUN 45*   < > 38* 31*  CREATININE 1.81*   < > 1.84* 1.75*  CALCIUM 9.1   < > 8.7* 8.4*  MG 2.3  --   --   --    < > = values in this interval not displayed.   Liver Function Tests Recent Labs    02/23/23 2007 02/25/23 0435  AST 68* 39  ALT 115* 81*   ALKPHOS 171* 139*  BILITOT 1.2* 1.6*  PROT 6.1* 6.1*  ALBUMIN 3.1* 3.0*   No results for input(s): "LIPASE", "AMYLASE" in the last 72 hours. Cardiac Enzymes No results for input(s): "CKTOTAL", "CKMB", "CKMBINDEX", "TROPONINI" in the last 72 hours.  BNP: BNP (last 3 results) Recent Labs    02/19/23 1117 02/21/23 1519 02/23/23 2007  BNP 3,124.7* 2,950.2* 4,190.1*    ProBNP (last 3 results) Recent Labs    09/08/22 1308 01/18/23 1326 02/18/23 1503  PROBNP 5,154* 9,910* 24,266*     D-Dimer No results for input(s): "DDIMER" in the last 72 hours. Hemoglobin A1C No results for input(s): "HGBA1C" in the last 72 hours. Fasting Lipid Panel No results for input(s): "CHOL", "HDL", "LDLCALC", "TRIG", "CHOLHDL", "LDLDIRECT" in the last 72 hours. Thyroid Function Tests Recent Labs    02/23/23 2007  TSH 1.717    Other results:   Imaging    No results found.   Medications:     Scheduled Medications:  apixaban  5 mg Oral  BID   atorvastatin  40 mg Oral Daily   brinzolamide  1 drop Both Eyes BID   budesonide  0.25 mg Nebulization BID   Chlorhexidine Gluconate Cloth  6 each Topical Daily   cyanocobalamin  1,000 mcg Oral Daily   dapagliflozin propanediol  10 mg Oral Daily   ferrous sulfate  325 mg Oral Daily   fluticasone  2 spray Each Nare Daily   latanoprost  1 drop Both Eyes QHS   levothyroxine  100 mcg Oral Daily   sodium chloride flush  3 mL Intravenous Q12H    Infusions:  milrinone 0.25 mcg/kg/min (02/24/23 1727)    PRN Medications: acetaminophen, albuterol, ALPRAZolam, ondansetron (ZOFRAN) IV, sodium chloride flush    Patient Profile   77 y.o. female with history of chronic systolic CHF/NICM, CAD, CKD IIIb, restrictive lung disease, OSA, breast cancer s/p lumpectomy/chemo (adriamycin)/XRT.  Admitted with acute on chronic systolic CHF with low-output.  Assessment/Plan  1. Acute on chronic systolic CHF:  - Primarily nonischemic cardiomyopathy.   Thought to be due to prior Adriamycin chemotherapy (chemo in 2000, cardiomyopathy diagnosed 2001).  She refused ICD in the past.  Cardiac MRI in 3/21 showed LV EF 20%, RV EF 34%, transmural LGE mid-apical anterior wall, possible prior infarction.  Echo in 10/24 showed EF 25-30%, dilated LV, low normal RV function, moderate MR.  RHC in 10/24 did not show low output.   - RHC 02/23/23 with primarily left-sided failure with mean RA pressure 4, mean PCWP 20, CI 1.63, and preserved PAPi 2.5. - Echo this admit: LVEF 25-30%, RV okay, moderate to severe MR -NYHA IV symptoms on admit. Lactic acid 1.6.  - CO-OX 66% on 0.25 milrinone (see discussion below) - Diuresis difficult with low RA pressure and elevated PCWP. Volume looks good today. Hold on diuretics. - Off Toprol XL with low output.  - Currently on 10 mg Farxiga daily (stopped in past d/t weakness) - GDMT limited by BP and renal function - Poor prognosis with low output HF.  Options are limited.  Not a candidate for transplant. LVAD would be very high risk at her age and small size. She also states she would not want to pursue LVAD, she wishes to avoid invasive procedures. Her husband inquired about Barostim and CCM devices. She would likely have limited improvement in symptom burden with either device given her advanced heart failure. Her husband is requesting formal consult from EP to discuss candidacy for device implant.  Discussed with EP, she will be seen as consult today. Appreciate assistance. Family also interested in pursuing second opinions from other Advanced Heart Failure centers for management of her heart failure.  - Palliative Care following for GOC discussions. Appreciate input.  2. Sinus tachycardia: - Rate 110s-130s today - Likely drive by combination of end-stage heart failure and inotrope support with milrinone - Patient's husband and son are very concerned about her tachycardia and want to see how she progresses with lower dose of  milrinone.  - Milrinone reduced to 0.125 mcg/kg/min.  3. CAD:  - Cath in 10/24 in setting of NSTEMI showed 80% OM3 stenosis, possibly a thrombotic lesion post-COVID-19 PNA.  No chest pain.   - Continue Eliquis 5 mg bid (?thrombotic coronary lesion).  - Continue atorvastatin 40 daily.   4. CKD stage 3:  - Creatinine has been variable 1.4-1.8, 1.75 today - Suspect cardiorenal syndrome in setting of low output HF. - Follow BMET closely.  5. Mitral regurgitation:  - There has been some  consideration for possible Mitraclip.  - Echo this admit with moderate to severe MR  6. Elevated LFTs:  - Suspect congestive hepatopathy.  -Slowly improving - Follow  7. Hypokalemia: - Resolved - Supp as needed   GOC:  - Poor prognosis with stage D HF.  - Limited options. Could consider palliative home inotrope. See discussion above. - Remains full code. Palliative Care following.  Length of Stay: 2  FINCH, LINDSAY N, PA-C  02/25/2023, 2:00 PM  Advanced Heart Failure Team Pager [phone] (M-F; 7a - 5p)  Please contact [facility] for night-coverage after hours (5p -7a ) and weekends on [email or web address]  Patient seen and examined with the above-signed Advanced Practice Provider and/or Housestaff. I personally reviewed laboratory data, imaging studies and relevant notes. I independently examined the patient and formulated the important aspects of the plan. I have edited the note to reflect any of my changes or salient points. I have personally discussed the plan with the patient and/or family.  Remains on milrinone 0.25. Remains fatigued. Worsening sinus tachycardia heart rates 110-130 at rest up to 140 when getting up.  Co-ox 66% CVP ok   Husband and son at bedside  General:  Weak appearing. No resp difficulty HEENT: normal Neck: supple. no JVD. Carotids 2+ bilat; no bruits. No lymphadenopathy or thryomegaly appreciated. Cor: Regular tachy Lungs: clear Abdomen: soft, nontender,  nondistended. No hepatosplenomegaly. No bruits or masses. Good bowel sounds. Extremities: no cyanosis, clubbing, rash, edema Neuro: alert & orientedx3, cranial nerves grossly intact. moves all 4 extremities w/o difficulty. Affect pleasant  She has end-stage HF with low-output on admission RHC (CI 1.6). MV sat improved on milrinone but remains symptomatic and has increasing sinus tachycardia.   Family with multiple questions regarding tachycardia. I tried to explain that this is sinus tachycardia and is physiologic response to low stroke volume potentially exacerbated by milrinone. They asked if there was anything we could give her to help slow her HR. I explained that this would likely compromise her CO and make her feel worse. We eventually decided to drop milrinone to 0.125 and see if that helped.   We have received multiple messages that family has been calling various offices (both inside and outside of [facility]) and asking for second opinions including having Dr. Lalla Brothers to come by and discuss possible CCM device. I told them that I welcome second opinions and understand the concern they have for their family member but I requested that they let us know so we can help arrange the f/u for them instead of calling the outpatient offices.   When I again raised the fact that she was likely terminally ill, her husband said "you told us that yesterday, we don't want to talk about that now".   They have requested that Dr. Shirlee Latch come to see them today to discuss her care. We have arranged this.   With her stage D HF suspect only meaningful option for her is trial of palliative home inotropes.   Total time spent with patient and talking to colleagues > 50 minutes   Arvilla Meres, MD  3:39 PM

## 2023-02-25 NOTE — Progress Notes (Signed)
Palliative Care Progress Note, Assessment & Plan   Patient Name: Nichole Johnson       Date: 02/25/2023 DOB: 12-29-1945  Age: 77 y.o. MRN#: [id number] Attending Physician: Laurey Morale, MD Primary Care Physician: [name], MD Admit Date: 02/23/2023  Subjective: Patient is lying in bed in no apparent distress.  She acknowledges my presence and is able to make her wishes known.  Her lunch tray is at bedside.  She shares she is having difficulty swallowing because it is so dry.  Her husband is at bedside during my visit.  HPI: 77 y.o. female  with past medical history of primarily nonischemic cardiomyopathy (diagnosed 2001 after Adriamycin treatment for breast cancer), CAD s/p NSTEMI, breast cancer s/p lumpectomy/chemo (adriamycin) and XRT in 2000, HL, HTN, restrictive lung disease, OSA, CKD IIIb, hypothyroidism, and glaucoma admitted on 02/23/2023 with SOB. Patient endorsed progressively feeling poorly over the last 2 months.     HF team following. Course of hospitalization includes RHC revealing low output HF complicated by cardiorenal syndrome (CI 1.63). PLan is for home milrinone gtt or LVAD pending discussions.    PMT was consulted to discuss GOC.   Summary of counseling/coordination of care: Extensive chart review completed prior to meeting patient including labs, vital signs, imaging, progress notes, orders, and available advanced directive documents from current and previous encounters.   After reviewing the patient's chart and assessing the patient at bedside, I spoke with patient in regards to boundaries and goals of care.  Patient's husband shares concerns that he has not heard back from cardiology in regards to CCM or barostim placement.  I highlighted that Dr. Prescott Gum comments reflect that  patient is not a candidate for these interventions as she would not do well and they would offer no benefit to her as her heart failure is too advanced.    I highlighted that patient has shared she does not want to have any invasive procedures, like open heart surgery or LVAD. However, during today's discussion, patient states she wants to know what her options are. Patient's husband would like to speak with Dr. Lalla Brothers to discuss CCM placement. Message sent via secure chat to HF PA to see if Dr. Lalla Brothers is able to speak with patient during hospitalization.   Advance care planning and CODE STATUS again discussed.  Patient shares she continues to feel overwhelmed and surprised by all of the intense medical issues she has been recently diagnosed with.  She wishes to defer discussion of her CODE STATUS to a later date.  Full code and full scope remain.  I shared with husband that I will assist in trying to get an answer from Dr. Lalla Brothers directly in regards to appropriate and available interventions. Discussed with PA Lillia Abed of HF who then spoke with husband in regards to cardiology consult.    We also discussed that if no interventions are offered then a palliative milrinone drip is her option.  Discussed quality versus quantity of life and avoiding suffering.  Opportunity and space given for family and patient to ask questions. Acceptance appears to be a barrier for patient and husband at this time. Therapeutic silence, active listening, and emotional support provided.   PMT  will continue to follow and support patient and family throughout her hospitalization.  Physical Exam Vitals reviewed.  Constitutional:      General: She is not in acute distress.    Appearance: She is normal weight.  HENT:     Head: Normocephalic.     Mouth/Throat:     Mouth: Mucous membranes are moist.  Eyes:     Pupils: Pupils are equal, round, and reactive to light.  Cardiovascular:     Rate and Rhythm: Tachycardia  present.  Pulmonary:     Effort: Pulmonary effort is normal.  Abdominal:     Palpations: Abdomen is soft.  Musculoskeletal:        General: Normal range of motion.     Comments: MAETC  Skin:    General: Skin is warm and dry.  Neurological:     Mental Status: She is alert and oriented to person, place, and time.  Psychiatric:        Mood and Affect: Mood normal.        Behavior: Behavior normal.        Thought Content: Thought content normal.        Judgment: Judgment normal.             Total Time 35 minutes   Time spent includes: Detailed review of medical records (labs, imaging, vital signs), medically appropriate exam (mental status, respiratory, cardiac, skin), discussed with treatment team, counseling and educating patient, family and staff, documenting clinical information, medication management and coordination of care.  Samara Deist L. Bonita Quin, DNP, FNP-BC Palliative Medicine Team

## 2023-02-26 DIAGNOSIS — I5022 Chronic systolic (congestive) heart failure: Secondary | ICD-10-CM | POA: Diagnosis not present

## 2023-02-26 DIAGNOSIS — I5043 Acute on chronic combined systolic (congestive) and diastolic (congestive) heart failure: Secondary | ICD-10-CM

## 2023-02-26 DIAGNOSIS — Z515 Encounter for palliative care: Secondary | ICD-10-CM | POA: Diagnosis not present

## 2023-02-26 LAB — COOXEMETRY PANEL
Carboxyhemoglobin: 1 % (ref 0.5–1.5)
Carboxyhemoglobin: 1.5 % (ref 0.5–1.5)
Methemoglobin: <0.7 % (ref 0.0–1.5)
Methemoglobin: 0.7 % (ref 0.0–1.5)
O2 Saturation: 52.6 %
O2 Saturation: 72.2 %
Total hemoglobin: 12.3 g/dL (ref 12.0–16.0)
Total hemoglobin: 12.8 g/dL (ref 12.0–16.0)

## 2023-02-26 LAB — CBC
HCT: 37.6 % (ref 36.0–46.0)
Hemoglobin: 11.9 g/dL — ABNORMAL LOW (ref 12.0–15.0)
MCH: 26.3 pg (ref 26.0–34.0)
MCHC: 31.6 g/dL (ref 30.0–36.0)
MCV: 83.2 fL (ref 80.0–100.0)
Platelets: 261 10*3/uL (ref 150–400)
RBC: 4.52 MIL/uL (ref 3.87–5.11)
RDW: 16.3 % — ABNORMAL HIGH (ref 11.5–15.5)
WBC: 6.8 10*3/uL (ref 4.0–10.5)
nRBC: 0 % (ref 0.0–0.2)

## 2023-02-26 LAB — BASIC METABOLIC PANEL
Anion gap: 8 (ref 5–15)
BUN: 21 mg/dL (ref 8–23)
CO2: 23 mmol/L (ref 22–32)
Calcium: 8.6 mg/dL — ABNORMAL LOW (ref 8.9–10.3)
Chloride: 106 mmol/L (ref 98–111)
Creatinine, Ser: 1.42 mg/dL — ABNORMAL HIGH (ref 0.44–1.00)
GFR, Estimated: 38 mL/min — ABNORMAL LOW (ref >60)
Glucose, Bld: 100 mg/dL — ABNORMAL HIGH (ref 70–99)
Potassium: 4.4 mmol/L (ref 3.5–5.1)
Sodium: 137 mmol/L (ref 135–145)

## 2023-02-26 MED ORDER — IVABRADINE HCL 5 MG PO TABS
5.0000 mg | ORAL_TABLET | Freq: Two times a day (BID) | ORAL | Status: DC
  Administered 2023-02-26 – 2023-03-04 (×13): 5 mg via ORAL
  Filled 2023-02-26 (×13): qty 1

## 2023-02-26 MED ORDER — POTASSIUM CHLORIDE CRYS ER 20 MEQ PO TBCR
20.0000 meq | EXTENDED_RELEASE_TABLET | Freq: Once | ORAL | Status: AC
  Administered 2023-02-26: 20 meq via ORAL
  Filled 2023-02-26: qty 1

## 2023-02-26 MED ORDER — FUROSEMIDE 10 MG/ML IJ SOLN
40.0000 mg | Freq: Two times a day (BID) | INTRAMUSCULAR | Status: AC
  Administered 2023-02-26 (×2): 40 mg via INTRAVENOUS
  Filled 2023-02-26 (×2): qty 4

## 2023-02-26 MED ORDER — APIXABAN 2.5 MG PO TABS
2.5000 mg | ORAL_TABLET | Freq: Two times a day (BID) | ORAL | Status: DC
  Administered 2023-02-26 – 2023-03-04 (×12): 2.5 mg via ORAL
  Filled 2023-02-26 (×12): qty 1

## 2023-02-26 NOTE — TOC Progression Note (Signed)
Transition of Care [facility]) - Progression Note    Patient Details  Name: Nichole Johnson MRN: [id number] Date of Birth: [birth year]  Transition of Care [facility] CM/SW Contact  Nicanor Bake Phone Number: [phone] 02/26/2023, 12:01 PM  Clinical Narrative:   HF CSW called to schedule pts hospital follow up appointment. PCP is going to contact pt in regards to scheduling due to having no availability over the next 2-3 weeks. Pt  has an upcoming appointment on March 29, 2023 at 10:20 AM for her annual physical.    TOC will continue following.     Expected Discharge Plan: Home w Home Health Services Barriers to Discharge: Continued Medical Work up  Expected Discharge Plan and Services   Discharge Planning Services: CM Consult Post Acute Care Choice: Home Health Living arrangements for the past 2 months: Single Family Home Expected Discharge Date: 02/26/23                         HH Arranged: RN, PT, OT [facility] Agency: [facility] Date [facility] Agency Contacted: 02/24/23 Time HH Agency Contacted: 1515 Representative spoke with at [facility] Agency: Hassel Neth   Social Determinants of Health (SDOH) Interventions SDOH Screenings   Food Insecurity: No Food Insecurity (02/23/2023)  Housing: Low Risk  (02/23/2023)  Transportation Needs: No Transportation Needs (02/23/2023)  Utilities: Not At Risk (02/23/2023)  Social Connections: Unknown (06/27/2022)   Received from [facility]  Tobacco Use: Low Risk  (02/22/2023)    Readmission Risk Interventions     No data to display

## 2023-02-26 NOTE — Telephone Encounter (Signed)
Patient's husband is following up as he states this matter is urgent. He would like a call back with updates at [phone].

## 2023-02-26 NOTE — Progress Notes (Signed)
Palliative Care Progress Note, Assessment & Plan   Patient Name: Nichole Johnson       Date: 02/26/2023 DOB: Aug 01, 1945  Age: 77 y.o. MRN#: [id number] Attending Physician: Laurey Morale, MD Primary Care Physician: [name], MD Admit Date: 02/23/2023  Subjective: Patient is lying in bed in no apparent distress.  She is asleep but easily awakens to my presence.  She is alert and oriented x 4.  Her husband is at bedside.  HPI: 77 y.o. female  with past medical history of primarily nonischemic cardiomyopathy (diagnosed 2001 after Adriamycin treatment for breast cancer), CAD s/p NSTEMI, breast cancer s/p lumpectomy/chemo (adriamycin) and XRT in 2000, HL, HTN, restrictive lung disease, OSA, CKD IIIb, hypothyroidism, and glaucoma admitted on 02/23/2023 with SOB. Patient endorsed progressively feeling poorly over the last 2 months.     HF team following. Course of hospitalization includes RHC revealing low output HF complicated by cardiorenal syndrome (CI 1.63). PLan is for home milrinone gtt or LVAD pending discussions.    PMT was consulted to discuss GOC.   Summary of counseling/coordination of care: Extensive chart review completed prior to meeting patient including labs, vital signs, imaging, progress notes, orders, and available advanced directive documents from current and previous encounters.   After reviewing the patient's chart and assessing the patient at bedside, I spoke with patient and her husband in regards to symptom management and boundaries of care.  Patient continues to say that she gets dyspnea with exertion but that she has not felt any pain or discomfort.  No adjustment to [facility] needed.  Plan remains for patient to return home with milrinone gtt.  Husband shares he is still hoping to find  solutions for her.  Therapeutic silence, active listening, and emotional support provided.  Copy of hard choices for loving people as well as MOST form provided for patient/husband's review.  Discussed importance of advanced care planning.  Patient and husband appreciative of PMT support and information.  Full code and full scope remain.  Patient and family made aware that PMT will step back from daiyl visits as goals are clear. PMT remains available to patient and family throughtout her hospitalization.  Please reengage with PMT if goals change, at patient/family's request, or if patient's health deteriorates during hospitalization.  Physical Exam Vitals reviewed.  Constitutional:      General: She is not in acute distress.    Appearance: She is normal weight.  HENT:     Head: Normocephalic.     Mouth/Throat:     Mouth: Mucous membranes are moist.  Eyes:     Pupils: Pupils are equal, round, and reactive to light.  Cardiovascular:     Rate and Rhythm: Tachycardia present.     Pulses: Normal pulses.  Pulmonary:     Effort: Pulmonary effort is normal.  Musculoskeletal:        General: Normal range of motion.  Skin:    General: Skin is warm and dry.  Neurological:     Mental Status: She is alert and oriented to person, place, and time.  Psychiatric:        Mood and Affect: Mood normal.        Behavior: Behavior normal.  Thought Content: Thought content normal.        Judgment: Judgment normal.             Total Time 35 minutes   Time spent includes: Detailed review of medical records (labs, imaging, vital signs), medically appropriate exam (mental status, respiratory, cardiac, skin), discussed with treatment team, counseling and educating patient, family and staff, documenting clinical information, medication management and coordination of care.  Samara Deist L. Bonita Quin, DNP, FNP-BC Palliative Medicine Team

## 2023-02-26 NOTE — Progress Notes (Signed)
Patient ID: Nichole Johnson, female   DOB: 1945/09/27, 77 y.o.   MRN: [id number] Patient in yellow mews since 0400, telemetry called with 2 runs of SVT, asymptomatic, one at shift change one at 1000. Md aware   Lidia Collum, RN

## 2023-02-26 NOTE — Progress Notes (Signed)
PHARMACY - ANTICOAGULATION CONSULT NOTE  Pharmacy Consult for apixaban Indication: ?thrombotic coronary lesion  Allergies  Allergen Reactions   Penicillins Itching and Other (See Comments)    Vulvovaginal Candidiasis with associated pruritis  Vaginal Infections  Vaginal Infections    Vulvovaginal Candidiasis with associated pruritis    Vulvovaginal Candidiasis with associated pruritis Vaginal Infections Vaginal Infections   Sulfamethoxazole-Trimethoprim Other (See Comments)    Makes her shaky and sweat   Tramadol Other (See Comments)    Makes her shaky and sweat    Patient Measurements: Height: 4\' 11"  (149.9 cm) Weight: 52.6 kg (115 lb 14.4 oz) IBW/kg (Calculated) : 43.2  Labs: Recent Labs    02/23/23 1438 02/23/23 2007 02/24/23 0500 02/24/23 1511 02/25/23 0435 02/26/23 0419  HGB 13.9  14.3 12.4  --   --   --  11.9*  HCT 41.0  42.0 38.0  --   --   --  37.6  PLT  --  264  --   --   --  261  CREATININE  --  1.81*   < > 1.84* 1.75* 1.42*   < > = values in this interval not displayed.    Estimated Creatinine Clearance: 23.8 mL/min (A) (by C-G formula based on SCr of 1.42 mg/dL (H)).   Assessment: Patient currently taking apixaban 5 mg bid, however over the course of the last month, Scr has consistently been hovering > 1.5.  Goal of Therapy:   Monitor platelets by anticoagulation protocol: Yes   Plan:  Due to low body weight and borderline renal function will adjust apixaban to 2.5 mg bid.  Reece Leader, Colon Flattery, BCCP Clinical Pharmacist  02/26/2023 11:02 AM   [facility] pharmacy phone numbers are listed on [email or web address]

## 2023-02-26 NOTE — Progress Notes (Addendum)
Patient ID: Nichole Johnson, female   DOB: 1945/11/10, 77 y.o.   MRN: [id number]     Advanced Heart Failure Rounding Note  PCP-Cardiologist: Gypsy Balsam, MD   Subjective:    Early am co-ox 53% this morning on milrinone 0.125.  CVP 13. No diuretic yesterday.  Creatinine 1.75 => 1.42.    HR 100s at rest this morning but from yesterday's report jumps significantly with exertion (sinus tachycardia).   She feels better on milrinone, less fatigue.    Objective:   Weight Range: 52.6 kg Body mass index is 23.41 kg/m.   Vital Signs:   Temp:  [97.5 F (36.4 C)-98 F (36.7 C)] 97.6 F (36.4 C) (11/15 0445) Pulse Rate:  [84-89] 89 (11/15 0445) Resp:  [19-20] 19 (11/15 0445) BP: (98-104)/(69-79) 98/79 (11/15 0445) SpO2:  [97 %-100 %] 97 % (11/15 0445) Weight:  [52.6 kg] 52.6 kg (11/15 0445) Last BM Date : 02/25/23  Weight change: Filed Weights   02/24/23 0424 02/25/23 0551 02/26/23 0445  Weight: 51.5 kg 52.3 kg 52.6 kg    Intake/Output:   Intake/Output Summary (Last 24 hours) at 02/26/2023 0758 Last data filed at 02/26/2023 0600 Gross per 24 hour  Intake 788.15 ml  Output 1000 ml  Net -211.85 ml      Physical Exam   General: NAD Neck:JVP 10-12 cm, no thyromegaly or thyroid nodule.  Lungs: Clear to auscultation bilaterally with normal respiratory effort. CV: Lateral PMI  Heart mildly tachy, regular S1/S2, no S3/S4, no murmur.  No peripheral edema.   Abdomen: Soft, nontender, no hepatosplenomegaly, no distention.  Skin: Intact without lesions or rashes.  Neurologic: Alert and oriented x 3.  Psych: Normal affect. Extremities: No clubbing or cyanosis.  HEENT: Normal.     Telemetry   Sinus tachy 100s at rest (personally reviewed)  Labs    CBC Recent Labs    02/23/23 2007 02/26/23 0419  WBC 6.2 6.8  HGB 12.4 11.9*  HCT 38.0 37.6  MCV 80.9 83.2  PLT 264 261   Basic Metabolic Panel Recent Labs    60/63/01 2007 02/24/23 0500 02/25/23 0435  02/26/23 0419  NA 133*   < > 132* 137  K 3.4*   < > 4.0 4.4  CL 99   < > 102 106  CO2 22   < > 22 23  GLUCOSE 108*   < > 125* 100*  BUN 45*   < > 31* 21  CREATININE 1.81*   < > 1.75* 1.42*  CALCIUM 9.1   < > 8.4* 8.6*  MG 2.3  --   --   --    < > = values in this interval not displayed.   Liver Function Tests Recent Labs    02/23/23 2007 02/25/23 0435  AST 68* 39  ALT 115* 81*  ALKPHOS 171* 139*  BILITOT 1.2* 1.6*  PROT 6.1* 6.1*  ALBUMIN 3.1* 3.0*   No results for input(s): "LIPASE", "AMYLASE" in the last 72 hours. Cardiac Enzymes No results for input(s): "CKTOTAL", "CKMB", "CKMBINDEX", "TROPONINI" in the last 72 hours.  BNP: BNP (last 3 results) Recent Labs    02/19/23 1117 02/21/23 1519 02/23/23 2007  BNP 3,124.7* 2,950.2* 4,190.1*    ProBNP (last 3 results) Recent Labs    09/08/22 1308 01/18/23 1326 02/18/23 1503  PROBNP 5,154* 9,910* 24,266*     D-Dimer No results for input(s): "DDIMER" in the last 72 hours. Hemoglobin A1C No results for input(s): "HGBA1C" in the last 72 hours. Fasting  Lipid Panel No results for input(s): "CHOL", "HDL", "LDLCALC", "TRIG", "CHOLHDL", "LDLDIRECT" in the last 72 hours. Thyroid Function Tests Recent Labs    02/23/23 2007  TSH 1.717    Other results:   Imaging    No results found.   Medications:     Scheduled Medications:  apixaban  5 mg Oral BID   atorvastatin  40 mg Oral Daily   brinzolamide  1 drop Both Eyes BID   budesonide  0.25 mg Nebulization BID   Chlorhexidine Gluconate Cloth  6 each Topical Daily   cyanocobalamin  1,000 mcg Oral Daily   dapagliflozin propanediol  10 mg Oral Daily   ferrous sulfate  325 mg Oral Daily   fluticasone  2 spray Each Nare Daily   furosemide  40 mg Intravenous BID   ivabradine  5 mg Oral BID WC   latanoprost  1 drop Both Eyes QHS   levothyroxine  100 mcg Oral Daily   potassium chloride  20 mEq Oral Once   sodium chloride flush  3 mL Intravenous Q12H     Infusions:  milrinone 0.125 mcg/kg/min (02/25/23 2030)    PRN Medications: acetaminophen, albuterol, ALPRAZolam, ondansetron (ZOFRAN) IV, sodium chloride flush    Patient Profile   77 y.o. female with history of chronic systolic CHF/NICM, CAD, CKD IIIb, restrictive lung disease, OSA, breast cancer s/p lumpectomy/chemo (adriamycin)/XRT.  Admitted with acute on chronic systolic CHF with low-output.  Assessment/Plan   1. Acute on chronic systolic CHF: Primarily nonischemic cardiomyopathy.  Thought to be due to prior Adriamycin chemotherapy (chemo in 2000, cardiomyopathy diagnosed 2001).  She refused ICD in the past.  Cardiac MRI in 3/21 showed LV EF 20%, RV EF 34%, transmural LGE mid-apical anterior wall, possible prior infarction.  Echo in 10/24 showed EF 25-30%, dilated LV, low normal RV function, moderate MR.  RHC 02/23/23 with primarily left-sided failure with mean RA pressure 4, mean PCWP 20, CI 1.63, and preserved PAPi 2.5.  Echo this admit with LVEF 25-30% (looks 25-30% to me), RV okay, moderate to severe MR. NYHA class IV symptoms at home. Now on milrinone for low output, feels considerably better.  Milrinone decreased to 0.125 yesterday due to sinus tachycardia. HR in 100s this morning at rest. Co-ox, however, is lower at 53% (early am). CVP up to 13 today.  - Repeat co-ox now that she is awake and about => 72%, acceptable  - Continue milrinone 0.125 for now, may need to increase back to 0.25.  - Significant sinus tachycardia with milrinone, HR jumps with activity.  I will add ivabradine 5 mg bid and watch response today.  - Lasix 40 mg IV bid x 2 doses today then likely to torsemide 10 mg daily tomorrow.  Replace K.  - Off Toprol XL with low output.  - Currently on 10 mg Farxiga daily, continue.  - GDMT limited by BP and renal function - Poor prognosis with low output HF.  Options are limited.  LVAD would be very high risk at her age and small size. She also states she would not  want to pursue LVAD, she wishes to avoid invasive procedures. We discussed Barostim and CCM devices. These devices have helped with symptoms in patients with moderate heart failure but have not been studied in patients with end stage heart failure who are inotrope dependent.  I do not think that she would get significant benefit from these devices and she would not qualify for barostimulator device regardless with high BNP.  I spoke with the patient and her husband at length today, I think that her best option is going to be palliative home milrinone.  They are agreeable with this.  I will work on arranging to get her home on milrinone, possibly over the weekend.  Will need to decide on final dose (0.125 vs 0.25).  - Palliative Care following for GOC discussions. Appreciate input. 2. Sinus tachycardia: Likely drive by combination of end-stage heart failure and inotrope support with milrinone.  HR significantly elevated with activity.  Milrinone decreased to 0.125.  - Add ivabradine 5 mg bid.  - Walk in halls.  3. CAD: Cath in 10/24 in setting of NSTEMI showed 80% OM3 stenosis, possibly a thrombotic lesion post-COVID-19 PNA.  No chest pain.   - Continue Eliquis 5 mg bid (?thrombotic coronary lesion).  - Continue atorvastatin 40 daily.  4. CKD stage 3: Suspect cardiorenal syndrome in setting of low output HF.  Creatinine lower at 1.4 today with inotropic support.   5. Mitral regurgitation: Echo this admit with moderate to severe MR.  6. 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  - Follow, slowly improving.   GOC:  - Poor prognosis with stage D HF.  - Limited options. Will plan on palliative home milrinone.  - Remains full code. Palliative Care following.  Length of Stay: 3  Marca Ancona, MD  02/26/2023, 7:58 AM  Advanced Heart Failure Team Pager [phone] (M-F; 7a - 5p)  Please contact [facility] for night-coverage after hours (5p -7a ) and weekends on [email or web address]

## 2023-02-26 NOTE — TOC Progression Note (Signed)
Transition of Care [facility]) - Progression Note    Patient Details  Name: Nichole Johnson MRN: [id number] Date of Birth: 07/13/1945  [facility] CM/SW Contact  Elliot Cousin, RN Phone Number: [phone] 02/26/2023, 1:39 PM  Clinical Narrative:   CM spoke to pt at bedside, cancelled [facility] with rep, Tresa Endo. They do not do Home Milrinone. Made pt aware and she is agreeable to [facility]. Contacted [facility] rep, Lynette with new referral. (Medicare.gov list placed on chart) [facility] rep, Pam is follow for possible dc Monday with Home Milrinone. Explained to pt that agency will do teaching and provide medication for home. Husband or son will provide transportation home.  Pt has RW with seat and bedside commode at home.       Expected Discharge Plan: Home w Home Health Services Barriers to Discharge: Continued Medical Work up  Expected Discharge Plan and Services   Discharge Planning Services: CM Consult Post Acute Care Choice: Home Health Living arrangements for the past 2 months: Single Family Home Expected Discharge Date: 02/26/23                         HH Arranged: RN HH Agency: [facility], [facility] Date HH Agency Contacted: 02/26/23 Time HH Agency Contacted: [phone] Representative spoke with at [facility] Agency: [facility], Lacie Scotts rep, Lynette   Social Determinants of Health (SDOH) Interventions SDOH Screenings   Food Insecurity: No Food Insecurity (02/23/2023)  Housing: Low Risk  (02/23/2023)  Transportation Needs: No Transportation Needs (02/23/2023)  Utilities: Not At Risk (02/23/2023)  Social Connections: Unknown (06/27/2022)   Received from [facility], [facility]  Tobacco Use: Low Risk  (02/22/2023)    Readmission Risk Interventions     No data to display

## 2023-02-26 NOTE — Care Management Important Message (Signed)
Important Message  Patient Details  Name: Nichole Johnson MRN: [id number] Date of Birth: Nov 17, 1945   Important Message Given:  Yes - Medicare IM     Dorena Bodo 02/26/2023, 3:20 PM

## 2023-02-27 DIAGNOSIS — I5043 Acute on chronic combined systolic (congestive) and diastolic (congestive) heart failure: Secondary | ICD-10-CM | POA: Diagnosis not present

## 2023-02-27 LAB — CBC
HCT: 37.5 % (ref 36.0–46.0)
Hemoglobin: 12 g/dL (ref 12.0–15.0)
MCH: 26.3 pg (ref 26.0–34.0)
MCHC: 32 g/dL (ref 30.0–36.0)
MCV: 82.1 fL (ref 80.0–100.0)
Platelets: 263 10*3/uL (ref 150–400)
RBC: 4.57 MIL/uL (ref 3.87–5.11)
RDW: 16.7 % — ABNORMAL HIGH (ref 11.5–15.5)
WBC: 7 10*3/uL (ref 4.0–10.5)
nRBC: 0.3 % — ABNORMAL HIGH (ref 0.0–0.2)

## 2023-02-27 LAB — BASIC METABOLIC PANEL
Anion gap: 10 (ref 5–15)
Anion gap: 12 (ref 5–15)
BUN: 26 mg/dL — ABNORMAL HIGH (ref 8–23)
BUN: 27 mg/dL — ABNORMAL HIGH (ref 8–23)
CO2: 22 mmol/L (ref 22–32)
CO2: 20 mmol/L — ABNORMAL LOW (ref 22–32)
Calcium: 8.1 mg/dL — ABNORMAL LOW (ref 8.9–10.3)
Calcium: 8.2 mg/dL — ABNORMAL LOW (ref 8.9–10.3)
Chloride: 101 mmol/L (ref 98–111)
Chloride: 99 mmol/L (ref 98–111)
Creatinine, Ser: 1.57 mg/dL — ABNORMAL HIGH (ref 0.44–1.00)
Creatinine, Ser: 1.48 mg/dL — ABNORMAL HIGH (ref 0.44–1.00)
GFR, Estimated: 33 mL/min — ABNORMAL LOW (ref >60)
GFR, Estimated: 36 mL/min — ABNORMAL LOW (ref >60)
Glucose, Bld: 111 mg/dL — ABNORMAL HIGH (ref 70–99)
Glucose, Bld: 224 mg/dL — ABNORMAL HIGH (ref 70–99)
Potassium: 3.6 mmol/L (ref 3.5–5.1)
Potassium: 5.1 mmol/L (ref 3.5–5.1)
Sodium: 133 mmol/L — ABNORMAL LOW (ref 135–145)
Sodium: 131 mmol/L — ABNORMAL LOW (ref 135–145)

## 2023-02-27 LAB — COOXEMETRY PANEL
Carboxyhemoglobin: 1.1 % (ref 0.5–1.5)
Carboxyhemoglobin: 0.9 % (ref 0.5–1.5)
Methemoglobin: 0.7 % (ref 0.0–1.5)
Methemoglobin: <0.7 % (ref 0.0–1.5)
O2 Saturation: 53.2 %
O2 Saturation: 45.2 %
Total hemoglobin: 12.7 g/dL (ref 12.0–16.0)
Total hemoglobin: 12.7 g/dL (ref 12.0–16.0)

## 2023-02-27 MED ORDER — TORSEMIDE 20 MG PO TABS
10.0000 mg | ORAL_TABLET | Freq: Every day | ORAL | Status: DC
  Administered 2023-02-27 – 2023-02-28 (×2): 10 mg via ORAL
  Filled 2023-02-27 (×2): qty 1

## 2023-02-27 MED ORDER — POTASSIUM CHLORIDE 20 MEQ PO PACK
40.0000 meq | PACK | Freq: Two times a day (BID) | ORAL | Status: DC
  Administered 2023-02-27: 40 meq via ORAL
  Filled 2023-02-27: qty 2

## 2023-02-27 MED ORDER — MAGNESIUM SULFATE 2 GM/50ML IV SOLN
2.0000 g | Freq: Once | INTRAVENOUS | Status: AC
  Administered 2023-02-27: 2 g via INTRAVENOUS
  Filled 2023-02-27: qty 50

## 2023-02-27 NOTE — Progress Notes (Signed)
Progress Note  Patient Name: Nichole Johnson Date of Encounter: 02/27/2023 Primary Cardiologist: Gypsy Balsam, MD   Subjective   Nichole Johnson, a 77 year old patient with a history of non-ischemic cardiomyopathy, acute on chronic systolic heart failure, and coronary artery disease, presents with recent episodes of non-sustained ventricular tachycardia (VT).   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 The patient has had two episodes of non-sustained VT, with the first lasting 22 beats and the second 6 beats.   The patient was placed on milrinone for low output, which was decreased to 0.125 micrograms due to sinus tachycardia. Despite this decrease, the patient's heart rate remained elevated at 119.  The patient's liver function tests have been slowly improving, with the AST now within normal limits. However, the patient has experienced some hyponatremia, with sodium levels fluctuating between 132 and 137. The patient's potassium will need to be replaced, and the creatinine has been between 1.4 and 1.8. The patient's hemoglobin has improved and is now low normal.  The patient's family has expressed concerns about the sinus tachycardia and heart failure, and has inquired about the possibility of a heart transplant. The patient remains a full code after PC discussion 02/26/23  Vital Signs    Vitals:   02/26/23 1956 02/27/23 0101 02/27/23 0443 02/27/23 0741  BP: 98/70 109/80 96/65 99/76   Pulse:  90 95 (!) 119  Resp: 20 19 (!) 22 18  Temp:  97.8 F (36.6 C) 97.9 F (36.6 C) 97.7 F (36.5 C)  TempSrc:  Oral Oral Oral  SpO2:  100% 96% 98%  Weight:   52.3 kg   Height:        Intake/Output Summary (Last 24 hours) at 02/27/2023 1019 Last data filed at 02/27/2023 0700 Gross per 24 hour  Intake 120 ml  Output 2400 ml  Net -2280 ml   Filed Weights   02/25/23 0551 02/26/23 0445 02/27/23 0443  Weight: 52.3 kg 52.6 kg  52.3 kg    Physical Exam   GEN: No acute distress.   Neck: No JVD Cardiac: Regular tachycardia with systolic murmur, no rubs, or gallops.  Respiratory: Clear to auscultation bilaterally. GI: Soft, nontender, non-distended  MS: No edema  Labs   Telemetry: Sinus tachycardia with frequent PVCs and several runs of NSVT   Chemistry Recent Labs  Lab 02/21/23 1519 02/23/23 1438 02/23/23 2007 02/24/23 0500 02/25/23 0435 02/26/23 0419 02/27/23 0510  NA 128*   < > 133*   < > 132* 137 133*  K 4.3   < > 3.4*   < > 4.0 4.4 3.6  CL 96*  --  99   < > 102 106 101  CO2 17*  --  22   < > 22 23 22   GLUCOSE 145*  --  108*   < > 125* 100* 111*  BUN 49*  --  45*   < > 31* 21 26*  CREATININE 1.61*  --  1.81*   < > 1.75* 1.42* 1.57*  CALCIUM 9.6  --  9.1   < > 8.4* 8.6* 8.1*  PROT 6.9  --  6.1*  --  6.1*  --   --   ALBUMIN 3.8  --  3.1*  --  3.0*  --   --   AST 81*  --  68*  --  39  --   --   ALT 125*  --  115*  --  81*  --   --  ALKPHOS 191*  --  171*  --  139*  --   --   BILITOT 1.7*  --  1.2*  --  1.6*  --   --   GFRNONAA 32*  --  28*   < > 29* 38* 33*  ANIONGAP 15  --  12   < > 8 8 10    < > = values in this interval not displayed.     Hematology Recent Labs  Lab 02/23/23 2007 02/26/23 0419 02/27/23 0510  WBC 6.2 6.8 7.0  RBC 4.70 4.52 4.57  HGB 12.4 11.9* 12.0  HCT 38.0 37.6 37.5  MCV 80.9 83.2 82.1  MCH 26.4 26.3 26.3  MCHC 32.6 31.6 32.0  RDW 15.9* 16.3* 16.7*  PLT 264 261 263    Cardiac EnzymesNo results for input(s): "TROPONINI" in the last 168 hours. No results for input(s): "TROPIPOC" in the last 168 hours.   BNP Recent Labs  Lab 02/21/23 1519 02/23/23 2007  BNP 2,950.2* 4,190.1*     DDimer No results for input(s): "DDIMER" in the last 168 hours.   Cardiac Studies   Cardiac Studies & Procedures   CARDIAC CATHETERIZATION  CARDIAC CATHETERIZATION 02/23/2023  Narrative 1. Elevated PCWP with normal RA pressure. 2. Low cardiac output with CI 1.63. 3.  PAPi preserved 2.5.  Will admit for trial of milrinone with symptomatic low output HF.   CARDIAC CATHETERIZATION  CARDIAC CATHETERIZATION 01/25/2023  Narrative Images from the original result were not included. Right & Left Heart Catheterization 01/25/23: Hemodynamic data: LV 100/2, EDP 9 mmHg.  Ao 148/66, mean 95 mmHg.  No pressure gradient across the aortic valve. RA: 3/2, mean 2 mmHg. RV 37/2, EDP 5 mmHg. PA 36/15, mean 22 mmHg.  PA saturation 64%. PW 12/12, mean 10 mmHg.  Aortic saturation 94%. QP/QS 1.00. CO 4.3, CI 2.86 by Fick.  Angiographic data: LM: Large-caliber vessel.  It smooth and normal. LAD: Large-caliber vessel, gives origin to a moderate-sized D1 and several small diagonals.  It is small and normal. LCx: Large-caliber vessel giving origin to a very tiny OM1 and OM 2 and large OM 3.  Moderate sized AV groove circumflex.  The OM 3 has a focal 80% thrombotic lesion that appears to be organized.  There is TIMI-3 flow distally. RCA: Moderate caliber vessel, dominant, smooth and normal.    Impression and recommendations: PA pressure upper limit of normal with low LVEDP, fluoroscopy reveals significant pulmonary parenchymal markings, will probably need follow-up. A focal thrombotic stenosis noted in the large OM 3 branch of CX.  Will discontinue aspirin as patient does not have any significant coronary artery disease, focal thrombus appears to be probably related to recent COVID infection.  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  I have started the patient on Eliquis 5 mg p.o. twice daily, can be used for at least 3 months.  10 to 13 mL contrast utilized.  Findings Coronary Findings Diagnostic  Dominance: Right  Left Main Vessel was injected. Vessel is normal in caliber. Vessel is angiographically normal.  Left Anterior Descending Vessel was injected. Vessel is normal in caliber. Vessel is  angiographically normal.  Left Circumflex Vessel was injected. Vessel is normal in caliber. Vessel is angiographically normal.  Third Obtuse Marginal Branch Vessel was injected. Vessel is normal in size. Vessel is angiographically normal. 3rd Mrg lesion is 60% stenosed.  Right Coronary Artery Vessel was injected. Vessel is normal in caliber. Vessel is angiographically normal.  Intervention  No interventions have been documented.     ECHOCARDIOGRAM  ECHOCARDIOGRAM COMPLETE 02/24/2023  Narrative ECHOCARDIOGRAM REPORT    Patient Name:   MAYANA DUBOISE Date of Exam: 02/24/2023 Medical Rec #:  [id number]    Height:       59.0 in Accession #:    [id number]   Weight:       113.5 lb Date of Birth:  16-Oct-1945    BSA:          1.450 m Patient Age:    77 years     BP:           89/60 mmHg Patient Gender: F            HR:           95 bpm. Exam Location:  Inpatient  Procedure: 2D Echo, Color Doppler and Cardiac Doppler  Indications:    I50.21 Acute systolic (congestive) heart failure  History:        Patient has prior history of Echocardiogram examinations, most recent 01/23/2023. CHF, CAD; Risk Factors:Hypertension, Dyslipidemia and Sleep Apnea.  Sonographer:    Irving Burton Senior RDCS Referring Phys: [phone] [place] Ford [facility]   Sonographer Comments: Apicals limited by small rib spaces IMPRESSIONS   1. LVEF 25-30% with global HK. Apex not well visualized, would consider contrast study to exclude LV thrombus. Left ventricular ejection fraction, by estimation, is 25 to 30%. The left ventricle has severely decreased function. The left ventricle demonstrates global hypokinesis. The left ventricular internal cavity size was mildly dilated. Indeterminate diastolic filling due to E-A fusion. 2. Right ventricular systolic function is normal. The right ventricular size is normal. Tricuspid regurgitation signal is inadequate for assessing PA pressure. 3. Left atrial size was mild to moderately  dilated. 4. Right atrial size was mildly dilated. 5. The mitral valve is abnormal. Moderate to severe mitral valve regurgitation. No evidence of mitral stenosis. 6. The aortic valve is tricuspid. Aortic valve regurgitation is not visualized. No aortic stenosis is present. 7. The inferior vena cava is normal in size with greater than 50% respiratory variability, suggesting right atrial pressure of 3 mmHg.  Comparison(s): No significant change from prior study.  FINDINGS Left Ventricle: LVEF 25-30% with global HK. Apex not well visualized, would consider contrast study to exclude LV thrombus. Left ventricular ejection fraction, by estimation, is 25 to 30%. The left ventricle has severely decreased function. The left ventricle demonstrates global hypokinesis. The left ventricular internal cavity size was mildly dilated. There is no left ventricular hypertrophy. Indeterminate diastolic filling due to E-A fusion.  Right Ventricle: The right ventricular size is normal. No increase in right ventricular wall thickness. Right ventricular systolic function is normal. Tricuspid regurgitation signal is inadequate for assessing PA pressure.  Left Atrium: Left atrial size was mild to moderately dilated.  Right Atrium: Right atrial size was mildly dilated.  Pericardium: There is no evidence of pericardial effusion.  Mitral Valve: The mitral valve is abnormal. Moderate to severe mitral valve regurgitation. No evidence of mitral valve stenosis.  Tricuspid Valve: The tricuspid valve is grossly normal. Tricuspid valve regurgitation is trivial. No evidence of tricuspid stenosis.  Aortic Valve: The aortic valve is tricuspid. Aortic valve regurgitation is not visualized. No aortic stenosis is present.  Pulmonic Valve: The pulmonic valve was grossly normal. Pulmonic valve regurgitation is trivial. No evidence of pulmonic stenosis.  Aorta: The aortic root and ascending aorta are structurally normal, with no  evidence of dilitation.  Venous: The inferior  vena cava is normal in size with greater than 50% respiratory variability, suggesting right atrial pressure of 3 mmHg.  IAS/Shunts: The atrial septum is grossly normal.   LEFT VENTRICLE PLAX 2D LVIDd:         5.50 cm LVIDs:         5.00 cm LV PW:         0.60 cm LV IVS:        0.60 cm LVOT diam:     1.90 cm LV SV:         24 LV SV Index:   16 LVOT Area:     2.84 cm   RIGHT VENTRICLE RV S prime:     9.39 cm/s TAPSE (M-mode): 2.0 cm  LEFT ATRIUM             Index        RIGHT ATRIUM           Index LA diam:        3.90 cm 2.69 cm/m   RA Area:     19.00 cm LA Vol (A2C):   78.6 ml 54.21 ml/m  RA Volume:   54.50 ml  37.59 ml/m LA Vol (A4C):   45.8 ml 31.59 ml/m LA Biplane Vol: 60.2 ml 41.52 ml/m AORTIC VALVE LVOT Vmax:   62.00 cm/s LVOT Vmean:  40.500 cm/s LVOT VTI:    0.084 m  AORTA Ao Root diam: 2.80 cm Ao Asc diam:  2.60 cm  MR Peak grad:    70.6 mmHg MR Mean grad:    47.0 mmHg    SHUNTS MR Vmax:         420.00 cm/s  Systemic VTI:  0.08 m MR Vmean:        322.0 cm/s   Systemic Diam: 1.90 cm MR PISA:         2.26 cm MR PISA Eff ROA: 21 mm MR PISA Radius:  0.60 cm  Lennie Odor MD Electronically signed by Lennie Odor MD Signature Date/Time: 02/24/2023/10:22:14 AM    Final      CARDIAC MRI  MR CARDIAC MORPHOLOGY W WO CONTRAST 07/12/2019  Narrative CLINICAL DATA:  Cardiomyopathy suspected  COMPARISON: None available  EXAM: CARDIAC MRI  TECHNIQUE: The patient was scanned on a 1.5 Tesla GE magnet. A dedicated cardiac coil was used. Functional imaging was done using Fiesta sequences. 2,3, and 4 chamber views were done to assess for RWMA's. Modified Simpson's rule using a short axis stack was used to calculate an ejection fraction on a dedicated work [employer]. The patient received 8mL GADAVIST GADOBUTROL 1 MMOL/ML IV SOLN. After 10 minutes inversion recovery sequences  were used to assess for infiltration and scar tissue.  CONTRAST:  8mL GADAVIST GADOBUTROL 1 MMOL/ML IV SOLN  FINDINGS: LEFT VENTRICLE:  Normal left ventricular size, thickness. Severely reduced systolic function (LVEF = 20%). Severe global hypokinesis with mild dyssynchrony of the ventricular septum.  There is late gadolinium enhancement in the left ventricular myocardium in the mid to apical anterior wall, and appears transmural, suggesting prior infarct with no viability in this region.  Normal T1 myocardial nulling kinetics suggests against cardiac amyloidosis.  No LV thrombus.  ECV = 27%  RIGHT VENTRICLE:  Normal right ventricular size, thickness. Mild-moderately reduced systolic function (RVEF = 34%). There are no regional wall motion abnormalities.  ATRIA:  Mild LA enlargement.  Normal RA size.  VALVES:  Not well visualized due to motion artifact.  PERICARDIUM:  Normal  pericardium.  No pericardial effusion.  OTHER: Moderate size esophageal hiatal hernia incidentally noted. Recommend dedicated imaging to further assess. This finding appears to distort the LV base inferiorly.  MEASUREMENTS:  LVEDD: 47 mm  LVESD: 42 mm  LVEDV: 102 ml  LVESV: 82 ml  SV: 20 ml  CO: 1.6 L/min  Myocardial mass: 80 g  RVEDV: 45 ml  RVEDS: 30 ml  RVSV: 15 mL  IMPRESSION: 1. Normal left ventricular chamber size with severely reduced systolic function. LVEF 20%.  2. Normal right ventricular chamber size, mild-moderately reduced systolic function. RVEF 34%.  3. Transmural delayed myocardial enhancement mid-apical LV anterior wall, suggestive of prior infarct, with no viability in this distribution.  4.  No LV apical thrombus.  5. Moderate size hiatal hernia incidentally noted. Consider dedicated imaging to further assess.   Electronically Signed By: Weston Brass On: 07/17/2019 05:40           Assessment & Plan   Acute on Chronic Systolic  Heart Failure - Acute on chronic systolic heart failure secondary to non-ischemic cardiomyopathy, likely related to Adriamycin chemotherapy. Ejection fraction is 25-30% with low normal RV function and moderate mitral regurgitation. On milrinone for low output state, currently at 0.125 mcg/kg/min due to sinus tachycardia. Ivabradine 5 mg BID started to manage heart rate.  - Unable to take beta blockers due to low output state; limited GDMT related to kidney. Heart transplant not feasible due to age  -  Milrinone to be maintained at current dose unless clinical symptoms . Weaning off milrinone not possible this admission due to venous co-oximetry of 53%.  If she has worsening in her kidney function I will increase to 0.250 mcg/kg /min despite her NSVT - Continue milrinone at 0.125 mcg/kg/min - Continue ivabradine 5 mg BID - Start torsemide 10 mg PO daily - Replace potassium and magnesium aggressively - Monitor venous co-oximetry and adjust milrinone if necessary  Non-sustained Ventricular Tachycardia - Multiple episodes of non-sustained ventricular tachycardia (VT), longest episode 28 beats. Current management includes aggressive electrolyte repletion. Amiodarone will be initiated if further ectopy occurs. - Replace potassium and magnesium aggressively - Start amiodarone 400 mg PO BID if further non-sustained VT occurs, if worsening perfusion or clinical decline increase milrinone and start IV amiodarone  Coronary Artery Disease - Coronary artery disease managed with blood thinners and atorvastatin. No current symptoms reported.  - Continue blood current therapy - Continue atorvastatin  Hyponatremia - Hyponatremia with sodium levels fluctuating between 132-137 mmol/L. Ongoing monitoring and management, diuretic started today  Chronic Kidney Disease - Chronic kidney disease with creatinine levels fluctuating between 1.4-1.8 mg/dL.  - Ongoing monitoring and management with diuretic  today  General Health Maintenance Liver function tests improving with decongestion, AST within normal limits. Hemoglobin improved to low normal. Glucose stable at 117 mg/dL.  Goals of Care Remains a full code. No changes in goals of care after consultation with palliative care team.  BMP for PM Co-ox ordered for tomorrow  Discussed at length with family (husband, one son, and other family member)   For questions or updates, please contact [facility] Please consult [email or web address] for contact info under Cardiology/STEMI.      Riley Lam, MD FASE [facility]  [address] [place], [address] [place], [place] [address] [phone]  10:19 AM

## 2023-02-27 NOTE — Evaluation (Signed)
Physical Therapy Evaluation Patient Details Name: Nichole Johnson MRN: [id number] DOB: [birth year] Today's Date: 02/27/2023  History of Present Illness  77 year old patient admitted 11/12 presents with recent episodes of non-sustained ventricular tachycardia (VT)/CHF.  Pt treated with medicine.  PMH: non-ischemic cardiomyopathy, acute on chronic systolic heart failure, and coronary artery disease  Clinical Impression  Pt admitted with above diagnosis. Pt was able to ambulate with RW with good stability and will have assist at home.  Will follow acutely.  Tolerated treatment well today.  Pt currently with functional limitations due to the deficits listed below (see PT Problem List). Pt will benefit from acute skilled PT to increase their independence and safety with mobility to allow discharge.           If plan is discharge home, recommend the following:  (PRN assist)   Can travel by private vehicle        Equipment Recommendations Wheelchair (measurements PT);Wheelchair cushion (measurements PT) (CM - please let pt family know cost of wheelchair)  Recommendations for Other Services       Functional Status Assessment Patient has had a recent decline in their functional status and demonstrates the ability to make significant improvements in function in a reasonable and predictable amount of time.     Precautions / Restrictions Precautions Precautions:  (low fall risk) Restrictions Weight Bearing Restrictions: No      Mobility  Bed Mobility Overal bed mobility: Modified Independent                  Transfers Overall transfer level: Needs assistance Equipment used: Rolling walker (2 wheels) Transfers: Sit to/from Stand Sit to Stand: Supervision           General transfer comment: safety is appropriate    Ambulation/Gait Ambulation/Gait assistance: Contact guard assist Gait Distance (Feet): 110 Feet Assistive device: Rolling walker (2 wheels) Gait  Pattern/deviations: Step-through pattern   Gait velocity interpretation: 1.31 - 2.62 ft/sec, indicative of limited community ambulator   General Gait Details: safe, steady gait with good control of the RW.  Age appropriate gait speed.  No deviation with scanning and directional changes. Pt doesnt use device at home but didnt want to walk without device today.  Stairs            Wheelchair Mobility     Tilt Bed    Modified Rankin (Stroke Patients Only)       Balance Overall balance assessment: Needs assistance Sitting-balance support: No upper extremity supported, Feet supported Sitting balance-Leahy Scale: Good     Standing balance support: No upper extremity supported, Single extremity supported, During functional activity Standing balance-Leahy Scale: Good Standing balance comment: steadier gait with RW                             Pertinent Vitals/Pain Pain Assessment Pain Assessment: No/denies pain Faces Pain Scale: No hurt    Home Living Family/patient expects to be discharged to:: Private residence Living Arrangements: Spouse/significant other Available Help at Discharge: Family;Available 24 hours/day (son and husband) Type of Home: House Home Access: Stairs to enter Entrance Stairs-Rails: Right;Left;Can reach both Entrance Stairs-Number of Steps: 4 Alternate Level Stairs-Number of Steps: flight (with stair lift) Home Layout: Two level;Bed/bath upstairs Home Equipment: Grab bars - tub/shower;Rolling Walker (2 wheels);Cane - single Acupuncturist (4 wheels)      Prior Function Prior Level of Function : Independent/Modified Independent  Extremity/Trunk Assessment   Upper Extremity Assessment Upper Extremity Assessment: Defer to OT evaluation    Lower Extremity Assessment Lower Extremity Assessment: Generalized weakness    Cervical / Trunk Assessment Cervical / Trunk Assessment: Normal   Communication   Communication Communication: No apparent difficulties  Cognition Arousal: Alert Behavior During Therapy: WFL for tasks assessed/performed Overall Cognitive Status: Within Functional Limits for tasks assessed                                          General Comments General comments (skin integrity, edema, etc.): VSS with HR 104-115 bpm    Exercises General Exercises - Lower Extremity Ankle Circles/Pumps: AROM, Both, 10 reps, Seated Long Arc Quad: AROM, Both, 10 reps, Seated   Assessment/Plan    PT Assessment Patient needs continued PT services  PT Problem List Decreased strength;Decreased activity tolerance;Decreased balance;Decreased mobility;Decreased knowledge of use of DME;Cardiopulmonary status limiting activity       PT Treatment Interventions DME instruction;Gait training;Stair training;Functional mobility training;Therapeutic activities;Balance training;Patient/family education    PT Goals (Current goals can be found in the Care Plan section)  Acute Rehab PT Goals Patient Stated Goal: to go home PT Goal Formulation: With patient Time For Goal Achievement: 03/13/23 Potential to Achieve Goals: Good    Frequency Min 1X/week     Co-evaluation               AM-PAC PT "6 Clicks" Mobility  Outcome Measure Help needed turning from your back to your side while in a flat bed without using bedrails?: None Help needed moving from lying on your back to sitting on the side of a flat bed without using bedrails?: None Help needed moving to and from a bed to a chair (including a wheelchair)?: A Little Help needed standing up from a chair using your arms (e.g., wheelchair or bedside chair)?: A Little Help needed to walk in hospital room?: A Little Help needed climbing 3-5 steps with a railing? : A Little 6 Click Score: 20    End of Session Equipment Utilized During Treatment: Gait belt Activity Tolerance: Patient tolerated treatment  well Patient left: in bed;with call bell/phone within reach;with family/visitor present;with bed alarm set Nurse Communication: Mobility status PT Visit Diagnosis: Difficulty in walking, not elsewhere classified (R26.2)    Time: 1610-9604 PT Time Calculation (min) (ACUTE ONLY): 23 min   Charges:   PT Evaluation $PT Eval Moderate Complexity: 1 Mod PT Treatments $Gait Training: 8-22 mins PT General Charges $$ ACUTE PT VISIT: 1 Visit         Raiya Stainback M,PT Acute Rehab Services [phone]   Bevelyn Buckles 02/27/2023, 3:00 PM

## 2023-02-27 NOTE — Plan of Care (Signed)
  Problem: Education: Goal: Understanding of CV disease, CV risk reduction, and recovery process will improve Outcome: Progressing Goal: Individualized Educational Video(s) Outcome: Progressing   Problem: Activity: Goal: Ability to return to baseline activity level will improve Outcome: Progressing   Problem: Cardiovascular: Goal: Ability to achieve and maintain adequate cardiovascular perfusion will improve Outcome: Progressing Goal: Vascular access site(s) Level 0-1 will be maintained Outcome: Progressing   Problem: Health Behavior/Discharge Planning: Goal: Ability to safely manage health-related needs after discharge will improve Outcome: Progressing   Problem: Education: Goal: Knowledge of General Education information will improve Description: Including pain rating scale, medication(s)/side effects and non-pharmacologic comfort measures Outcome: Progressing   Problem: Health Behavior/Discharge Planning: Goal: Ability to manage health-related needs will improve Outcome: Progressing   Problem: Clinical Measurements: Goal: Ability to maintain clinical measurements within normal limits will improve Outcome: Progressing Goal: Will remain free from infection Outcome: Progressing Goal: Diagnostic test results will improve Outcome: Progressing Goal: Respiratory complications will improve Outcome: Progressing Goal: Cardiovascular complication will be avoided Outcome: Progressing   Problem: Activity: Goal: Risk for activity intolerance will decrease Outcome: Progressing   Problem: Coping: Goal: Level of anxiety will decrease Outcome: Progressing   Problem: Elimination: Goal: Will not experience complications related to bowel motility Outcome: Progressing Goal: Will not experience complications related to urinary retention Outcome: Progressing   Problem: Safety: Goal: Ability to remain free from injury will improve Outcome: Progressing

## 2023-02-27 NOTE — Plan of Care (Signed)
  Problem: Nutrition: Goal: Adequate nutrition will be maintained Outcome: Completed/Met   Problem: Pain Management: Goal: General experience of comfort will improve Outcome: Completed/Met   Problem: Skin Integrity: Goal: Risk for impaired skin integrity will decrease Outcome: Completed/Met

## 2023-02-28 DIAGNOSIS — I5043 Acute on chronic combined systolic (congestive) and diastolic (congestive) heart failure: Secondary | ICD-10-CM | POA: Diagnosis not present

## 2023-02-28 LAB — BASIC METABOLIC PANEL
Anion gap: 8 (ref 5–15)
BUN: 28 mg/dL — ABNORMAL HIGH (ref 8–23)
CO2: 22 mmol/L (ref 22–32)
Calcium: 8.9 mg/dL (ref 8.9–10.3)
Chloride: 103 mmol/L (ref 98–111)
Creatinine, Ser: 1.44 mg/dL — ABNORMAL HIGH (ref 0.44–1.00)
GFR, Estimated: 37 mL/min — ABNORMAL LOW (ref >60)
Glucose, Bld: 110 mg/dL — ABNORMAL HIGH (ref 70–99)
Potassium: 4.5 mmol/L (ref 3.5–5.1)
Sodium: 133 mmol/L — ABNORMAL LOW (ref 135–145)

## 2023-02-28 LAB — COOXEMETRY PANEL
Carboxyhemoglobin: 1.1 % (ref 0.5–1.5)
Methemoglobin: 0.8 % (ref 0.0–1.5)
O2 Saturation: 41.7 %
Total hemoglobin: 12.4 g/dL (ref 12.0–16.0)

## 2023-02-28 LAB — CBC
HCT: 37 % (ref 36.0–46.0)
Hemoglobin: 11.6 g/dL — ABNORMAL LOW (ref 12.0–15.0)
MCH: 25.7 pg — ABNORMAL LOW (ref 26.0–34.0)
MCHC: 31.4 g/dL (ref 30.0–36.0)
MCV: 82 fL (ref 80.0–100.0)
Platelets: 261 10*3/uL (ref 150–400)
RBC: 4.51 MIL/uL (ref 3.87–5.11)
RDW: 16.2 % — ABNORMAL HIGH (ref 11.5–15.5)
WBC: 6.9 10*3/uL (ref 4.0–10.5)
nRBC: 0 % (ref 0.0–0.2)

## 2023-02-28 LAB — MAGNESIUM: Magnesium: 2.7 mg/dL — ABNORMAL HIGH (ref 1.7–2.4)

## 2023-02-28 MED ORDER — AMIODARONE HCL 200 MG PO TABS
400.0000 mg | ORAL_TABLET | Freq: Every day | ORAL | Status: DC
  Administered 2023-02-28 – 2023-03-04 (×5): 400 mg via ORAL
  Filled 2023-02-28 (×5): qty 2

## 2023-02-28 NOTE — Progress Notes (Signed)
Progress Note  Patient Name: Nichole Johnson Date of Encounter: 02/28/2023 Primary Cardiologist: Gypsy Balsam, MD   Subjective   Venous Co-oximetry continues to fall.  Creatinine continues to improve. In the PM, she noted some shortness of breath, which she attributes to seeing her niece yesterday (she is a Engineer, civil (consulting) we reviewed her questions in 02/27/23)  Vital Signs    Vitals:   02/27/23 2337 02/28/23 0508 02/28/23 0728 02/28/23 0822  BP: 111/72 98/67 98/72    Pulse:  91 88   Resp: 17 18 18    Temp: 97.7 F (36.5 C) 97.6 F (36.4 C) 97.7 F (36.5 C)   TempSrc: Oral Oral Oral   SpO2: 99% 95% 100% 99%  Weight:  53.5 kg    Height:        Intake/Output Summary (Last 24 hours) at 02/28/2023 0938 Last data filed at 02/28/2023 0900 Gross per 24 hour  Intake 627.32 ml  Output 850 ml  Net -222.68 ml   Filed Weights   02/26/23 0445 02/27/23 0443 02/28/23 0508  Weight: 52.6 kg 52.3 kg 53.5 kg    Physical Exam   GEN: No acute distress.   Neck: No JVD Cardiac: Regular tachycardia with systolic murmur, no rubs, or gallops.  Respiratory: Clear to auscultation bilaterally. GI: Soft, nontender, non-distended  MS: No edema  Labs   Telemetry: SR with 2 episodes of NSVT   Chemistry Recent Labs  Lab 02/21/23 1519 02/23/23 1438 02/23/23 2007 02/24/23 0500 02/25/23 0435 02/26/23 0419 02/27/23 0510 02/27/23 1354 02/28/23 0500  NA 128*   < > 133*   < > 132*   < > 133* 131* 133*  K 4.3   < > 3.4*   < > 4.0   < > 3.6 5.1 4.5  CL 96*  --  99   < > 102   < > 101 99 103  CO2 17*  --  22   < > 22   < > 22 20* 22  GLUCOSE 145*  --  108*   < > 125*   < > 111* 224* 110*  BUN 49*  --  45*   < > 31*   < > 26* 27* 28*  CREATININE 1.61*  --  1.81*   < > 1.75*   < > 1.57* 1.48* 1.44*  CALCIUM 9.6  --  9.1   < > 8.4*   < > 8.1* 8.2* 8.9  PROT 6.9  --  6.1*  --  6.1*  --   --   --   --   ALBUMIN 3.8  --  3.1*  --  3.0*  --   --   --   --   AST 81*  --  68*  --  39  --   --   --    --   ALT 125*  --  115*  --  81*  --   --   --   --   ALKPHOS 191*  --  171*  --  139*  --   --   --   --   BILITOT 1.7*  --  1.2*  --  1.6*  --   --   --   --   GFRNONAA 32*  --  28*   < > 29*   < > 33* 36* 37*  ANIONGAP 15  --  12   < > 8   < > 10 12 8    < > = values  in this interval not displayed.     Hematology Recent Labs  Lab 02/26/23 0419 02/27/23 0510 02/28/23 0500  WBC 6.8 7.0 6.9  RBC 4.52 4.57 4.51  HGB 11.9* 12.0 11.6*  HCT 37.6 37.5 37.0  MCV 83.2 82.1 82.0  MCH 26.3 26.3 25.7*  MCHC 31.6 32.0 31.4  RDW 16.3* 16.7* 16.2*  PLT 261 263 261    Cardiac EnzymesNo results for input(s): "TROPONINI" in the last 168 hours. No results for input(s): "TROPIPOC" in the last 168 hours.   BNP Recent Labs  Lab 02/21/23 1519 02/23/23 2007  BNP 2,950.2* 4,190.1*     DDimer No results for input(s): "DDIMER" in the last 168 hours.   Cardiac Studies   Cardiac Studies & Procedures   CARDIAC CATHETERIZATION  CARDIAC CATHETERIZATION 02/23/2023  Narrative 1. Elevated PCWP with normal RA pressure. 2. Low cardiac output with CI 1.63. 3. PAPi preserved 2.5.  Will admit for trial of milrinone with symptomatic low output HF.   CARDIAC CATHETERIZATION  CARDIAC CATHETERIZATION 01/25/2023  Narrative Images from the original result were not included. Right & Left Heart Catheterization 01/25/23: Hemodynamic data: LV 100/2, EDP 9 mmHg.  Ao 148/66, mean 95 mmHg.  No pressure gradient across the aortic valve. RA: 3/2, mean 2 mmHg. RV 37/2, EDP 5 mmHg. PA 36/15, mean 22 mmHg.  PA saturation 64%. PW 12/12, mean 10 mmHg.  Aortic saturation 94%. QP/QS 1.00. CO 4.3, CI 2.86 by Fick.  Angiographic data: LM: Large-caliber vessel.  It smooth and normal. LAD: Large-caliber vessel, gives origin to a moderate-sized D1 and several small diagonals.  It is small and normal. LCx: Large-caliber vessel giving origin to a very tiny OM1 and OM 2 and large OM 3.  Moderate sized AV groove  circumflex.  The OM 3 has a focal 80% thrombotic lesion that appears to be organized.  There is TIMI-3 flow distally. RCA: Moderate caliber vessel, dominant, smooth and normal.    Impression and recommendations: PA pressure upper limit of normal with low LVEDP, fluoroscopy reveals significant pulmonary parenchymal markings, will probably need follow-up. A focal thrombotic stenosis noted in the large OM 3 branch of CX.  Will discontinue aspirin as patient does not have any significant coronary artery disease, focal thrombus appears to be probably related to recent COVID infection.  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  I have started the patient on Eliquis 5 mg p.o. twice daily, can be used for at least 3 months.  10 to 13 mL contrast utilized.  Findings Coronary Findings Diagnostic  Dominance: Right  Left Main Vessel was injected. Vessel is normal in caliber. Vessel is angiographically normal.  Left Anterior Descending Vessel was injected. Vessel is normal in caliber. Vessel is angiographically normal.  Left Circumflex Vessel was injected. Vessel is normal in caliber. Vessel is angiographically normal.  Third Obtuse Marginal Branch Vessel was injected. Vessel is normal in size. Vessel is angiographically normal. 3rd Mrg lesion is 60% stenosed.  Right Coronary Artery Vessel was injected. Vessel is normal in caliber. Vessel is angiographically normal.  Intervention  No interventions have been documented.     ECHOCARDIOGRAM  ECHOCARDIOGRAM COMPLETE 02/24/2023  Narrative ECHOCARDIOGRAM REPORT    Patient Name:   Nichole Johnson Date of Exam: 02/24/2023 Medical Rec #:  [id number]    Height:       59.0 in Accession #:    [id number]   Weight:       113.5 lb Date  of Birth:  Jul 27, 1945    BSA:          1.450 m Patient Age:    77 years     BP:           89/60 mmHg Patient Gender: F            HR:           95  bpm. Exam Location:  Inpatient  Procedure: 2D Echo, Color Doppler and Cardiac Doppler  Indications:    I50.21 Acute systolic (congestive) heart failure  History:        Patient has prior history of Echocardiogram examinations, most recent 01/23/2023. CHF, CAD; Risk Factors:Hypertension, Dyslipidemia and Sleep Apnea.  Sonographer:    Irving Burton Senior RDCS Referring Phys: [phone] [facility]   Sonographer Comments: Apicals limited by small rib spaces IMPRESSIONS   1. LVEF 25-30% with global HK. Apex not well visualized, would consider contrast study to exclude LV thrombus. Left ventricular ejection fraction, by estimation, is 25 to 30%. The left ventricle has severely decreased function. The left ventricle demonstrates global hypokinesis. The left ventricular internal cavity size was mildly dilated. Indeterminate diastolic filling due to E-A fusion. 2. Right ventricular systolic function is normal. The right ventricular size is normal. Tricuspid regurgitation signal is inadequate for assessing PA pressure. 3. Left atrial size was mild to moderately dilated. 4. Right atrial size was mildly dilated. 5. The mitral valve is abnormal. Moderate to severe mitral valve regurgitation. No evidence of mitral stenosis. 6. The aortic valve is tricuspid. Aortic valve regurgitation is not visualized. No aortic stenosis is present. 7. The inferior vena cava is normal in size with greater than 50% respiratory variability, suggesting right atrial pressure of 3 mmHg.  Comparison(s): No significant change from prior study.  FINDINGS Left Ventricle: LVEF 25-30% with global HK. Apex not well visualized, would consider contrast study to exclude LV thrombus. Left ventricular ejection fraction, by estimation, is 25 to 30%. The left ventricle has severely decreased function. The left ventricle demonstrates global hypokinesis. The left ventricular internal cavity size was mildly dilated. There is no left ventricular  hypertrophy. Indeterminate diastolic filling due to E-A fusion.  Right Ventricle: The right ventricular size is normal. No increase in right ventricular wall thickness. Right ventricular systolic function is normal. Tricuspid regurgitation signal is inadequate for assessing PA pressure.  Left Atrium: Left atrial size was mild to moderately dilated.  Right Atrium: Right atrial size was mildly dilated.  Pericardium: There is no evidence of pericardial effusion.  Mitral Valve: The mitral valve is abnormal. Moderate to severe mitral valve regurgitation. No evidence of mitral valve stenosis.  Tricuspid Valve: The tricuspid valve is grossly normal. Tricuspid valve regurgitation is trivial. No evidence of tricuspid stenosis.  Aortic Valve: The aortic valve is tricuspid. Aortic valve regurgitation is not visualized. No aortic stenosis is present.  Pulmonic Valve: The pulmonic valve was grossly normal. Pulmonic valve regurgitation is trivial. No evidence of pulmonic stenosis.  Aorta: The aortic root and ascending aorta are structurally normal, with no evidence of dilitation.  Venous: The inferior vena cava is normal in size with greater than 50% respiratory variability, suggesting right atrial pressure of 3 mmHg.  IAS/Shunts: The atrial septum is grossly normal.   LEFT VENTRICLE PLAX 2D LVIDd:         5.50 cm LVIDs:         5.00 cm LV PW:         0.60 cm LV  IVS:        0.60 cm LVOT diam:     1.90 cm LV SV:         24 LV SV Index:   16 LVOT Area:     2.84 cm   RIGHT VENTRICLE RV S prime:     9.39 cm/s TAPSE (M-mode): 2.0 cm  LEFT ATRIUM             Index        RIGHT ATRIUM           Index LA diam:        3.90 cm 2.69 cm/m   RA Area:     19.00 cm LA Vol (A2C):   78.6 ml 54.21 ml/m  RA Volume:   54.50 ml  37.59 ml/m LA Vol (A4C):   45.8 ml 31.59 ml/m LA Biplane Vol: 60.2 ml 41.52 ml/m AORTIC VALVE LVOT Vmax:   62.00 cm/s LVOT Vmean:  40.500 cm/s LVOT VTI:    0.084  m  AORTA Ao Root diam: 2.80 cm Ao Asc diam:  2.60 cm  MR Peak grad:    70.6 mmHg MR Mean grad:    47.0 mmHg    SHUNTS MR Vmax:         420.00 cm/s  Systemic VTI:  0.08 m MR Vmean:        322.0 cm/s   Systemic Diam: 1.90 cm MR PISA:         2.26 cm MR PISA Eff ROA: 21 mm MR PISA Radius:  0.60 cm  Lennie Odor MD Electronically signed by Lennie Odor MD Signature Date/Time: 02/24/2023/10:22:14 AM    Final      CARDIAC MRI  MR CARDIAC MORPHOLOGY W WO CONTRAST 07/12/2019  Narrative CLINICAL DATA:  Cardiomyopathy suspected  COMPARISON: None available  EXAM: CARDIAC MRI  TECHNIQUE: The patient was scanned on a 1.5 Tesla GE magnet. A dedicated cardiac coil was used. Functional imaging was done using Fiesta sequences. 2,3, and 4 chamber views were done to assess for RWMA's. Modified Simpson's rule using a short axis stack was used to calculate an ejection fraction on a dedicated work [employer]. The patient received 8mL GADAVIST GADOBUTROL 1 MMOL/ML IV SOLN. After 10 minutes inversion recovery sequences were used to assess for infiltration and scar tissue.  CONTRAST:  8mL GADAVIST GADOBUTROL 1 MMOL/ML IV SOLN  FINDINGS: LEFT VENTRICLE:  Normal left ventricular size, thickness. Severely reduced systolic function (LVEF = 20%). Severe global hypokinesis with mild dyssynchrony of the ventricular septum.  There is late gadolinium enhancement in the left ventricular myocardium in the mid to apical anterior wall, and appears transmural, suggesting prior infarct with no viability in this region.  Normal T1 myocardial nulling kinetics suggests against cardiac amyloidosis.  No LV thrombus.  ECV = 27%  RIGHT VENTRICLE:  Normal right ventricular size, thickness. Mild-moderately reduced systolic function (RVEF = 34%). There are no regional wall motion abnormalities.  ATRIA:  Mild LA enlargement.  Normal RA size.  VALVES:  Not well  visualized due to motion artifact.  PERICARDIUM:  Normal pericardium.  No pericardial effusion.  OTHER: Moderate size esophageal hiatal hernia incidentally noted. Recommend dedicated imaging to further assess. This finding appears to distort the LV base inferiorly.  MEASUREMENTS:  LVEDD: 47 mm  LVESD: 42 mm  LVEDV: 102 ml  LVESV: 82 ml  SV: 20 ml  CO: 1.6 L/min  Myocardial mass: 80 g  RVEDV: 45 ml  RVEDS: 30  ml  RVSV: 15 mL  IMPRESSION: 1. Normal left ventricular chamber size with severely reduced systolic function. LVEF 20%.  2. Normal right ventricular chamber size, mild-moderately reduced systolic function. RVEF 34%.  3. Transmural delayed myocardial enhancement mid-apical LV anterior wall, suggestive of prior infarct, with no viability in this distribution.  4.  No LV apical thrombus.  5. Moderate size hiatal hernia incidentally noted. Consider dedicated imaging to further assess.   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 Venous Co-ox continues to decrease, despite improvement in end organ function - Unable to take beta blockers due to low output state; limited GDMT related to kidney. Heart transplant not feasible due to age  - I will increase to 0.250 mcg/kg /min despite her NSVT and start PO Amiodarone - Continue ivabradine 5 mg BID - continue torsemide 10 mg PO daily - Replace potassium and magnesium aggressively - Monitor venous co-oximetry and adjust milrinone if necessary  Non-sustained Ventricular Tachycardia - Multiple episodes of non-sustained ventricular tachycardia (VT), longest episode 28 beats. Current management includes aggressive electrolyte repletion. Amiodarone will be initiated  - Replace potassium and  magnesium aggressively  Coronary Artery Disease - Coronary artery disease managed with blood thinners and atorvastatin. No current symptoms reported.  - Continue current therapy - Continue atorvastatin  Hyponatremia - Hyponatremia with sodium levels fluctuating between 132-137 mmol/L. Stable on current diuretic  Chronic Kidney Disease - Chronic kidney disease with creatinine levels fluctuating between 1.4-1.8 mg/dL.  - continues to improve   Goals of Care - Remains a full code. No changes in goals of care after consultation with palliative care team on Friday, nor today  Discussed at length with family (husband)   For questions or updates, please contact [facility] Please consult [email or web address] for contact info under Cardiology/STEMI.      Riley Lam, MD FASE [facility] Cardiologist [facility]  [address] [place], [address] [place], [place] [address] [phone]  9:38 AM

## 2023-02-28 NOTE — Progress Notes (Signed)
Mobility Specialist Progress Note:   02/28/23 1215  Mobility  Activity Ambulated with assistance in hallway  Level of Assistance Contact guard assist, steadying assist  Assistive Device Front wheel walker  Distance Ambulated (ft) 110 ft  Activity Response Tolerated well  Mobility Referral Yes  $Mobility charge 1 Mobility  Mobility Specialist Start Time (ACUTE ONLY) 1215  Mobility Specialist Stop Time (ACUTE ONLY) 1230  Mobility Specialist Time Calculation (min) (ACUTE ONLY) 15 min   Pt eager for mobility session. Required minG assist for safety. SpO2 97-100% on RA throughout ambulation. Pt c/o very minor SOB at end of ambulation, states improved from yesterday. Pt left sitting EOB with all needs met.   Addison Lank Mobility Specialist Please contact via SecureChat or  Rehab office at [phone]

## 2023-02-28 NOTE — Plan of Care (Signed)
  Problem: Elimination: Goal: Will not experience complications related to bowel motility Outcome: Completed/Met Goal: Will not experience complications related to urinary retention Outcome: Completed/Met

## 2023-03-01 ENCOUNTER — Other Ambulatory Visit (HOSPITAL_COMMUNITY): Payer: Self-pay

## 2023-03-01 ENCOUNTER — Telehealth (HOSPITAL_COMMUNITY): Payer: Self-pay | Admitting: Pharmacy Technician

## 2023-03-01 DIAGNOSIS — I5043 Acute on chronic combined systolic (congestive) and diastolic (congestive) heart failure: Secondary | ICD-10-CM | POA: Diagnosis not present

## 2023-03-01 LAB — CBC
HCT: 35.1 % — ABNORMAL LOW (ref 36.0–46.0)
Hemoglobin: 11.3 g/dL — ABNORMAL LOW (ref 12.0–15.0)
MCH: 26.6 pg (ref 26.0–34.0)
MCHC: 32.2 g/dL (ref 30.0–36.0)
MCV: 82.6 fL (ref 80.0–100.0)
Platelets: 255 10*3/uL (ref 150–400)
RBC: 4.25 MIL/uL (ref 3.87–5.11)
RDW: 16.3 % — ABNORMAL HIGH (ref 11.5–15.5)
WBC: 6.8 10*3/uL (ref 4.0–10.5)
nRBC: 0 % (ref 0.0–0.2)

## 2023-03-01 LAB — COOXEMETRY PANEL
Carboxyhemoglobin: 1.5 % (ref 0.5–1.5)
Carboxyhemoglobin: 1.2 % (ref 0.5–1.5)
Methemoglobin: <0.7 % (ref 0.0–1.5)
Methemoglobin: <0.7 % (ref 0.0–1.5)
O2 Saturation: 54.9 %
O2 Saturation: 53.8 %
Total hemoglobin: 11.8 g/dL — ABNORMAL LOW (ref 12.0–16.0)
Total hemoglobin: 12.6 g/dL (ref 12.0–16.0)

## 2023-03-01 LAB — BASIC METABOLIC PANEL
Anion gap: 9 (ref 5–15)
BUN: 30 mg/dL — ABNORMAL HIGH (ref 8–23)
CO2: 22 mmol/L (ref 22–32)
Calcium: 8.5 mg/dL — ABNORMAL LOW (ref 8.9–10.3)
Chloride: 100 mmol/L (ref 98–111)
Creatinine, Ser: 1.65 mg/dL — ABNORMAL HIGH (ref 0.44–1.00)
GFR, Estimated: 31 mL/min — ABNORMAL LOW (ref >60)
Glucose, Bld: 130 mg/dL — ABNORMAL HIGH (ref 70–99)
Potassium: 4.3 mmol/L (ref 3.5–5.1)
Sodium: 131 mmol/L — ABNORMAL LOW (ref 135–145)

## 2023-03-01 MED ORDER — POTASSIUM CHLORIDE CRYS ER 20 MEQ PO TBCR
20.0000 meq | EXTENDED_RELEASE_TABLET | Freq: Once | ORAL | Status: AC
  Administered 2023-03-01: 20 meq via ORAL
  Filled 2023-03-01: qty 1

## 2023-03-01 MED ORDER — FUROSEMIDE 10 MG/ML IJ SOLN
60.0000 mg | Freq: Two times a day (BID) | INTRAMUSCULAR | Status: DC
  Administered 2023-03-01 (×2): 60 mg via INTRAVENOUS
  Filled 2023-03-01 (×2): qty 6

## 2023-03-01 NOTE — Progress Notes (Signed)
Physical Therapy Treatment Patient Details Name: Nichole Johnson MRN: [id number] DOB: 23-Jan-1946 Today's Date: 03/01/2023   History of Present Illness 77 year old patient admitted 11/12 presents with recent episodes of non-sustained ventricular tachycardia (VT)/CHF.  Pt treated with medicine.  PMH: non-ischemic cardiomyopathy, acute on chronic systolic heart failure, and coronary artery disease    PT Comments  Pt admitted with above diagnosis. Pt was able to ambulate with RW with incr distance and improved endurance today.  VSS.  Pt progressing and has very supportive family.   Pt currently with functional limitations due to the deficits listed below (see PT Problem List). Pt will benefit from acute skilled PT to increase their independence and safety with mobility to allow discharge.       If plan is discharge home, recommend the following:  (PRN assist)   Can travel by private vehicle        Equipment Recommendations  Wheelchair (measurements PT);Wheelchair cushion (measurements PT) (CM - please let pt family know cost of wheelchair)    Recommendations for Other Services       Precautions / Restrictions Precautions Precautions:  (low fall risk) Restrictions Weight Bearing Restrictions: No     Mobility  Bed Mobility               General bed mobility comments: pt in chair on arrival watching a movie with her son. Needed to use 3N1 so son excused himself.    Transfers Overall transfer level: Needs assistance Equipment used: Rolling walker (2 wheels) Transfers: Sit to/from Stand Sit to Stand: Supervision           General transfer comment: safety is appropriate. Pt was able to clean herself once finished on 3N1.    Ambulation/Gait Ambulation/Gait assistance: Contact guard assist Gait Distance (Feet): 250 Feet Assistive device: Rolling walker (2 wheels) Gait Pattern/deviations: Step-through pattern   Gait velocity interpretation: <1.31 ft/sec, indicative of  household ambulator   General Gait Details: safe, steady gait with good control of the RW.  Age appropriate gait speed.  No deviation with scanning and directional changes. Pt doesnt use device at home but didnt want to walk without device.   Stairs             Wheelchair Mobility     Tilt Bed    Modified Rankin (Stroke Patients Only)       Balance Overall balance assessment: Needs assistance Sitting-balance support: No upper extremity supported, Feet supported Sitting balance-Leahy Scale: Good     Standing balance support: No upper extremity supported, Single extremity supported, During functional activity Standing balance-Leahy Scale: Good Standing balance comment: steadier gait with RW                            Cognition Arousal: Alert Behavior During Therapy: WFL for tasks assessed/performed Overall Cognitive Status: Within Functional Limits for tasks assessed                                          Exercises General Exercises - Lower Extremity Ankle Circles/Pumps: AROM, Both, 10 reps, Seated Long Arc Quad: AROM, Both, 10 reps, Seated    General Comments General comments (skin integrity, edema, etc.): VSS with HR 94-115 bpm.  Sats >90% on RA      Pertinent Vitals/Pain Pain Assessment Pain Assessment: No/denies pain Faces Pain Scale: No hurt  Home Living                          Prior Function            PT Goals (current goals can now be found in the care plan section) Acute Rehab PT Goals Patient Stated Goal: to go home Progress towards PT goals: Progressing toward goals    Frequency    Min 1X/week      PT Plan      Co-evaluation              AM-PAC PT "6 Clicks" Mobility   Outcome Measure  Help needed turning from your back to your side while in a flat bed without using bedrails?: None Help needed moving from lying on your back to sitting on the side of a flat bed without using  bedrails?: None Help needed moving to and from a bed to a chair (including a wheelchair)?: A Little Help needed standing up from a chair using your arms (e.g., wheelchair or bedside chair)?: A Little Help needed to walk in hospital room?: A Little Help needed climbing 3-5 steps with a railing? : A Little 6 Click Score: 20    End of Session Equipment Utilized During Treatment: Gait belt Activity Tolerance: Patient tolerated treatment well Patient left: with call bell/phone within reach;with family/visitor present;in chair Nurse Communication: Mobility status PT Visit Diagnosis: Difficulty in walking, not elsewhere classified (R26.2)     Time: 0865-7846 PT Time Calculation (min) (ACUTE ONLY): 17 min  Charges:    $Gait Training: 8-22 mins PT General Charges $$ ACUTE PT VISIT: 1 Visit                     Jaydn Moscato M,PT Acute Rehab Services [phone]    Bevelyn Buckles 03/01/2023, 4:17 PM

## 2023-03-01 NOTE — Plan of Care (Signed)
  Problem: Education: Goal: Individualized Educational Video(s) Outcome: Progressing   Problem: Activity: Goal: Ability to return to baseline activity level will improve Outcome: Progressing   Problem: Cardiovascular: Goal: Vascular access site(s) Level 0-1 will be maintained Outcome: Progressing   

## 2023-03-01 NOTE — Progress Notes (Addendum)
Pt had 10 beat run of vtach while resting. Pt asymptomatic, VS stable. MD notified. Will continue to monitor.

## 2023-03-01 NOTE — Progress Notes (Signed)
Mobility Specialist Progress Note:    03/01/23 1020  Mobility  Activity Ambulated with assistance in hallway  Level of Assistance Contact guard assist, steadying assist  Assistive Device Front wheel walker  Distance Ambulated (ft) 200 ft  Activity Response Tolerated well  Mobility Referral Yes  $Mobility charge 1 Mobility  Mobility Specialist Start Time (ACUTE ONLY) 1002  Mobility Specialist Stop Time (ACUTE ONLY) 1018  Mobility Specialist Time Calculation (min) (ACUTE ONLY) 16 min   Received pt in bed having no complaints and agreeable to mobility. C/o mild SOB, VSS, SPO2 was 96% HR 98. Returned to room w/o fault. Left in bed w/ call bell in reach and all needs met.   Thompson Grayer Mobility Specialist  Please contact vis Secure Chat or  Rehab Office [phone]

## 2023-03-01 NOTE — Progress Notes (Signed)
Patient ID: Nichole Johnson, female   DOB: [birth year], 77 y.o.   MRN: [id number]      Advanced Heart Failure Rounding Note  PCP-Cardiologist: Gypsy Balsam, MD   Subjective:    Early am co-ox 55% this morning on milrinone 0.25.  CVP 15-16. She is now on po torsemide.  Creatinine 1.44 => 1.65.    HR in 90s generally, had 1 run NSVT yesterday.  Amiodarone was begun given use of milrinone.   She feels better on milrinone, less fatigue. Still has dyspnea walking halfway down the hall.    Objective:   Weight Range: 54.2 kg Body mass index is 24.14 kg/m.   Vital Signs:   Temp:  [97.3 F (36.3 C)-97.9 F (36.6 C)] 97.4 F (36.3 C) (11/18 0727) Pulse Rate:  [86-102] 98 (11/18 0727) Resp:  [17-24] 24 (11/18 0727) BP: (91-114)/(60-88) 114/75 (11/18 0727) SpO2:  [91 %-100 %] 100 % (11/18 0727) Weight:  [54.2 kg] 54.2 kg (11/18 0529) Last BM Date : 02/28/23  Weight change: Filed Weights   02/27/23 0443 02/28/23 0508 03/01/23 0529  Weight: 52.3 kg 53.5 kg 54.2 kg    Intake/Output:   Intake/Output Summary (Last 24 hours) at 03/01/2023 0746 Last data filed at 03/01/2023 0733 Gross per 24 hour  Intake 246.63 ml  Output 851 ml  Net -604.37 ml      Physical Exam   General: NAD Neck: JVP 12-14 cm, no thyromegaly or thyroid nodule.  Lungs: Clear to auscultation bilaterally with normal respiratory effort. CV: Nondisplaced PMI.  Heart regular S1/S2, no S3/S4, no murmur.  Trace ankle edema.  Abdomen: Soft, nontender, no hepatosplenomegaly, no distention.  Skin: Intact without lesions or rashes.  Neurologic: Alert and oriented x 3.  Psych: Normal affect. Extremities: No clubbing or cyanosis.  HEENT: Normal.    Telemetry   NSR 90s, 1 run NSVT (personally reviewed)  Labs    CBC Recent Labs    02/28/23 0500 03/01/23 0552  WBC 6.9 6.8  HGB 11.6* 11.3*  HCT 37.0 35.1*  MCV 82.0 82.6  PLT 261 255   Basic Metabolic Panel Recent Labs    40/98/11 0500 03/01/23 0552   NA 133* 131*  K 4.5 4.3  CL 103 100  CO2 22 22  GLUCOSE 110* 130*  BUN 28* 30*  CREATININE 1.44* 1.65*  CALCIUM 8.9 8.5*  MG 2.7*  --    Liver Function Tests No results for input(s): "AST", "ALT", "ALKPHOS", "BILITOT", "PROT", "ALBUMIN" in the last 72 hours.  No results for input(s): "LIPASE", "AMYLASE" in the last 72 hours. Cardiac Enzymes No results for input(s): "CKTOTAL", "CKMB", "CKMBINDEX", "TROPONINI" in the last 72 hours.  BNP: BNP (last 3 results) Recent Labs    02/19/23 1117 02/21/23 1519 02/23/23 2007  BNP 3,124.7* 2,950.2* 4,190.1*    ProBNP (last 3 results) Recent Labs    09/08/22 1308 01/18/23 1326 02/18/23 1503  PROBNP 5,154* 9,910* 24,266*     D-Dimer No results for input(s): "DDIMER" in the last 72 hours. Hemoglobin A1C No results for input(s): "HGBA1C" in the last 72 hours. Fasting Lipid Panel No results for input(s): "CHOL", "HDL", "LDLCALC", "TRIG", "CHOLHDL", "LDLDIRECT" in the last 72 hours. Thyroid Function Tests No results for input(s): "TSH", "T4TOTAL", "T3FREE", "THYROIDAB" in the last 72 hours.  Invalid input(s): "FREET3"   Other results:   Imaging    No results found.   Medications:     Scheduled Medications:  amiodarone  400 mg Oral Daily   apixaban  2.5 mg Oral BID   atorvastatin  40 mg Oral Daily   brinzolamide  1 drop Both Eyes BID   budesonide  0.25 mg Nebulization BID   Chlorhexidine Gluconate Cloth  6 each Topical Daily   cyanocobalamin  1,000 mcg Oral Daily   dapagliflozin propanediol  10 mg Oral Daily   ferrous sulfate  325 mg Oral Daily   fluticasone  2 spray Each Nare Daily   furosemide  60 mg Intravenous BID   ivabradine  5 mg Oral BID WC   latanoprost  1 drop Both Eyes QHS   levothyroxine  100 mcg Oral Daily   potassium chloride  20 mEq Oral Once   sodium chloride flush  3 mL Intravenous Q12H   torsemide  10 mg Oral Daily    Infusions:  milrinone 0.25 mcg/kg/min (03/01/23 0719)    PRN  Medications: acetaminophen, albuterol, ALPRAZolam, ondansetron (ZOFRAN) IV, sodium chloride flush    Patient Profile   77 y.o. female with history of chronic systolic CHF/NICM, CAD, CKD IIIb, restrictive lung disease, OSA, breast cancer s/p lumpectomy/chemo (adriamycin)/XRT.  Admitted with acute on chronic systolic CHF with low-output.  Assessment/Plan   1. Acute on chronic systolic CHF: Primarily nonischemic cardiomyopathy.  Thought to be due to prior Adriamycin chemotherapy (chemo in 2000, cardiomyopathy diagnosed 2001).  She refused ICD in the past.  Cardiac MRI in 3/21 showed LV EF 20%, RV EF 34%, transmural LGE mid-apical anterior wall, possible prior infarction.  Echo in 10/24 showed EF 25-30%, dilated LV, low normal RV function, moderate MR.  RHC 02/23/23 with primarily left-sided failure with mean RA pressure 4, mean PCWP 20, CI 1.63, and preserved PAPi 2.5.  Echo this admit with LVEF 25-30% (looks 25-30% to me), RV okay, moderate to severe MR. NYHA class IV symptoms at home. Now on milrinone for low output, feels considerably better.  Milrinone increased back to 0.25, co-ox 55% early am.  CVP 15-16 this morning, still with volume overload.  - Continue milrinone 0.25.  Repeat co-ox now that she is awake and about.   - Continue ivabradine 5 mg bid, HR better.  - Lasix 60 mg IV bid x 2 doses today.  Replace K.  Hopefully can restart torsemide tomorrow at 20 to 40 mg daily (will need higher dose).  - Off Toprol XL with low output.  - Currently on 10 mg Farxiga daily, continue.  - GDMT limited by BP and renal function - Poor prognosis with low output HF.  Options are limited.  LVAD would be very high risk at her age and small size. She also states she would not want to pursue LVAD, she wishes to avoid invasive procedures. We discussed Barostim and CCM devices. These devices have helped with symptoms in patients with moderate heart failure but have not been studied in patients with end stage  heart failure who are inotrope dependent.  I do not think that she would get significant benefit from these devices and she would not qualify for barostimulator device regardless with high BNP.  I think that her best option is going to be palliative home milrinone.   - Palliative Care following for GOC discussions. Appreciate input. 2. Sinus tachycardia: Improved on ivabradine.  3. CAD: Cath in 10/24 in setting of NSTEMI showed 80% OM3 stenosis, possibly a thrombotic lesion post-COVID-19 PNA.  No chest pain.   - Continue Eliquis 2.5 mg bid based on weight and creatinine > 1.5 (?thrombotic coronary lesion).  - Continue atorvastatin 40  daily.  4. CKD stage 3: Suspect cardiorenal syndrome in setting of low output HF.  Creatinine 1.65 today with inotropic support.   5. Mitral regurgitation: Echo this admit with moderate to severe MR.  6. 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  - Follow, slowly improving.   GOC:  - Poor prognosis with stage D HF.  - Limited options. Will plan on palliative home milrinone.  - Remains full code. Palliative Care following.  Needs diuresis before discharge.   Length of Stay: 6  Marca Ancona, MD  03/01/2023, 7:46 AM  Advanced Heart Failure Team Pager [phone] (M-F; 7a - 5p)  Please contact [facility] for night-coverage after hours (5p -7a ) and weekends on [email or web address]

## 2023-03-01 NOTE — Telephone Encounter (Signed)
Pharmacy Patient Advocate Encounter   Received notification that prior authorization for Ivabradine HCl 5MG  tablets is required/requested.   Insurance verification completed.   The patient is insured through [employer] .   Per test claim: PA required; PA submitted to above mentioned insurance via CoverMyMeds Key/confirmation #/EOC [id number] Status is pending

## 2023-03-01 NOTE — TOC Benefit Eligibility Note (Signed)
Patient Product/process development scientist completed.    The patient is insured through [employer]. Patient has Medicare and is not eligible for a copay card, but may be able to apply for patient assistance, if available.    Ran test claim for ivabradine (Corlanor) 5 mg and Requires Prior Authorization   This test claim was processed through [employer]- copay amounts may vary at other pharmacies due to [employer], or as the patient moves through the different stages of their insurance plan.     Nichole Johnson, CPHT Pharmacy Technician III Certified Patient Advocate [facility] Patient Advocate Team Direct Number: [phone]  Fax: [phone]

## 2023-03-02 ENCOUNTER — Other Ambulatory Visit (HOSPITAL_COMMUNITY): Payer: Self-pay

## 2023-03-02 DIAGNOSIS — I5043 Acute on chronic combined systolic (congestive) and diastolic (congestive) heart failure: Secondary | ICD-10-CM | POA: Diagnosis not present

## 2023-03-02 LAB — CBC
HCT: 32.4 % — ABNORMAL LOW (ref 36.0–46.0)
Hemoglobin: 10.6 g/dL — ABNORMAL LOW (ref 12.0–15.0)
MCH: 26.6 pg (ref 26.0–34.0)
MCHC: 32.7 g/dL (ref 30.0–36.0)
MCV: 81.2 fL (ref 80.0–100.0)
Platelets: 222 10*3/uL (ref 150–400)
RBC: 3.99 MIL/uL (ref 3.87–5.11)
RDW: 16 % — ABNORMAL HIGH (ref 11.5–15.5)
WBC: 6.1 10*3/uL (ref 4.0–10.5)
nRBC: 0 % (ref 0.0–0.2)

## 2023-03-02 LAB — BASIC METABOLIC PANEL
Anion gap: 8 (ref 5–15)
Anion gap: 12 (ref 5–15)
BUN: 30 mg/dL — ABNORMAL HIGH (ref 8–23)
BUN: 32 mg/dL — ABNORMAL HIGH (ref 8–23)
CO2: 25 mmol/L (ref 22–32)
CO2: 25 mmol/L (ref 22–32)
Calcium: 8.5 mg/dL — ABNORMAL LOW (ref 8.9–10.3)
Calcium: 9 mg/dL (ref 8.9–10.3)
Chloride: 102 mmol/L (ref 98–111)
Chloride: 97 mmol/L — ABNORMAL LOW (ref 98–111)
Creatinine, Ser: 1.59 mg/dL — ABNORMAL HIGH (ref 0.44–1.00)
Creatinine, Ser: 2.16 mg/dL — ABNORMAL HIGH (ref 0.44–1.00)
GFR, Estimated: 33 mL/min — ABNORMAL LOW (ref >60)
GFR, Estimated: 23 mL/min — ABNORMAL LOW (ref >60)
Glucose, Bld: 94 mg/dL (ref 70–99)
Glucose, Bld: 85 mg/dL (ref 70–99)
Potassium: 3.1 mmol/L — ABNORMAL LOW (ref 3.5–5.1)
Potassium: 3.2 mmol/L — ABNORMAL LOW (ref 3.5–5.1)
Sodium: 135 mmol/L (ref 135–145)
Sodium: 134 mmol/L — ABNORMAL LOW (ref 135–145)

## 2023-03-02 LAB — COOXEMETRY PANEL
Carboxyhemoglobin: 1.5 % (ref 0.5–1.5)
Methemoglobin: <0.7 % (ref 0.0–1.5)
O2 Saturation: 57.4 %
Total hemoglobin: 10.9 g/dL — ABNORMAL LOW (ref 12.0–16.0)

## 2023-03-02 LAB — MAGNESIUM: Magnesium: 2.1 mg/dL (ref 1.7–2.4)

## 2023-03-02 MED ORDER — POTASSIUM CHLORIDE CRYS ER 20 MEQ PO TBCR
40.0000 meq | EXTENDED_RELEASE_TABLET | ORAL | Status: AC
  Administered 2023-03-02 (×2): 40 meq via ORAL
  Filled 2023-03-02 (×2): qty 2

## 2023-03-02 MED ORDER — FUROSEMIDE 10 MG/ML IJ SOLN
80.0000 mg | Freq: Two times a day (BID) | INTRAMUSCULAR | Status: DC
  Administered 2023-03-02 (×2): 80 mg via INTRAVENOUS
  Filled 2023-03-02 (×2): qty 8

## 2023-03-02 MED ORDER — POTASSIUM CHLORIDE CRYS ER 20 MEQ PO TBCR
60.0000 meq | EXTENDED_RELEASE_TABLET | ORAL | Status: AC
  Administered 2023-03-02 (×2): 60 meq via ORAL
  Filled 2023-03-02 (×2): qty 3

## 2023-03-02 MED ORDER — POTASSIUM CHLORIDE CRYS ER 20 MEQ PO TBCR: 40.0000 meq | EXTENDED_RELEASE_TABLET | ORAL | Status: DC

## 2023-03-02 MED ORDER — METOLAZONE 2.5 MG PO TABS
2.5000 mg | ORAL_TABLET | Freq: Once | ORAL | Status: AC
  Administered 2023-03-02: 2.5 mg via ORAL
  Filled 2023-03-02: qty 1

## 2023-03-02 NOTE — Progress Notes (Signed)
Pt's BP 79/51. Pt asymptomatic. MD paged. Verbal order to titrate milrinone to 0.125 mcg/kg/min. Will continue to monitor.

## 2023-03-02 NOTE — Progress Notes (Signed)
Patient ID: Nichole Johnson, female   DOB: November 24, 1945, 77 y.o.   MRN: [id number]      Advanced Heart Failure Rounding Note  PCP-Cardiologist: Gypsy Balsam, MD   Subjective:    Co-ox 57% this morning, unsure if milrinone 0.25 or 0.125 (milrinone turned down overnight due to mildly low BP).  CVP 14-15. Weight down 2 lbs, had IV Lasix yesterday.  Creatinine 1.44 => 1.65 => 1.59.    HR in 90s generally.  Amiodarone was begun given use of milrinone with NSVT runs.   She feels better on milrinone, less fatigue. Still has dyspnea walking halfway down the hall.    Objective:   Weight Range: 53.3 kg Body mass index is 23.73 kg/m.   Vital Signs:   Temp:  [97.4 F (36.3 C)-98.1 F (36.7 C)] 97.9 F (36.6 C) (11/19 0720) Pulse Rate:  [71-96] 84 (11/19 0720) Resp:  [16-21] 20 (11/19 0720) BP: (79-115)/(44-76) 88/74 (11/19 0720) SpO2:  [93 %-100 %] 99 % (11/19 0720) Weight:  [53.3 kg] 53.3 kg (11/19 0728) Last BM Date : 03/01/23  Weight change: Filed Weights   02/28/23 0508 03/01/23 0529 03/02/23 0728  Weight: 53.5 kg 54.2 kg 53.3 kg    Intake/Output:   Intake/Output Summary (Last 24 hours) at 03/02/2023 0803 Last data filed at 03/02/2023 0714 Gross per 24 hour  Intake 699 ml  Output 2100 ml  Net -1401 ml      Physical Exam   General: NAD Neck: JVP 14 cm, no thyromegaly or thyroid nodule.  Lungs: Clear to auscultation bilaterally with normal respiratory effort. CV: Lateral PMI.  Heart regular S1/S2, no S3/S4, no murmur.  No peripheral edema.   Abdomen: Soft, nontender, no hepatosplenomegaly, no distention.  Skin: Intact without lesions or rashes.  Neurologic: Alert and oriented x 3.  Psych: Normal affect. Extremities: No clubbing or cyanosis.  HEENT: Normal.    Telemetry   NSR 90s, no further NSVT (personally reviewed)  Labs    CBC Recent Labs    03/01/23 0552 03/02/23 0602  WBC 6.8 6.1  HGB 11.3* 10.6*  HCT 35.1* 32.4*  MCV 82.6 81.2  PLT 255 222    Basic Metabolic Panel Recent Labs    46/96/29 0500 03/01/23 0552 03/02/23 0602  NA 133* 131* 135  K 4.5 4.3 3.1*  CL 103 100 102  CO2 22 22 25   GLUCOSE 110* 130* 94  BUN 28* 30* 30*  CREATININE 1.44* 1.65* 1.59*  CALCIUM 8.9 8.5* 8.5*  MG 2.7*  --   --    Liver Function Tests No results for input(s): "AST", "ALT", "ALKPHOS", "BILITOT", "PROT", "ALBUMIN" in the last 72 hours.  No results for input(s): "LIPASE", "AMYLASE" in the last 72 hours. Cardiac Enzymes No results for input(s): "CKTOTAL", "CKMB", "CKMBINDEX", "TROPONINI" in the last 72 hours.  BNP: BNP (last 3 results) Recent Labs    02/19/23 1117 02/21/23 1519 02/23/23 2007  BNP 3,124.7* 2,950.2* 4,190.1*    ProBNP (last 3 results) Recent Labs    09/08/22 1308 01/18/23 1326 02/18/23 1503  PROBNP 5,154* 9,910* 24,266*     D-Dimer No results for input(s): "DDIMER" in the last 72 hours. Hemoglobin A1C No results for input(s): "HGBA1C" in the last 72 hours. Fasting Lipid Panel No results for input(s): "CHOL", "HDL", "LDLCALC", "TRIG", "CHOLHDL", "LDLDIRECT" in the last 72 hours. Thyroid Function Tests No results for input(s): "TSH", "T4TOTAL", "T3FREE", "THYROIDAB" in the last 72 hours.  Invalid input(s): "FREET3"   Other results:   Imaging  No results found.   Medications:     Scheduled Medications:  amiodarone  400 mg Oral Daily   apixaban  2.5 mg Oral BID   atorvastatin  40 mg Oral Daily   brinzolamide  1 drop Both Eyes BID   budesonide  0.25 mg Nebulization BID   Chlorhexidine Gluconate Cloth  6 each Topical Daily   cyanocobalamin  1,000 mcg Oral Daily   dapagliflozin propanediol  10 mg Oral Daily   ferrous sulfate  325 mg Oral Daily   fluticasone  2 spray Each Nare Daily   furosemide  80 mg Intravenous BID   ivabradine  5 mg Oral BID WC   latanoprost  1 drop Both Eyes QHS   levothyroxine  100 mcg Oral Daily   metolazone  2.5 mg Oral Once   sodium chloride flush  3 mL  Intravenous Q12H    Infusions:  milrinone 0.125 mcg/kg/min (03/02/23 0545)    PRN Medications: acetaminophen, albuterol, ALPRAZolam, ondansetron (ZOFRAN) IV, sodium chloride flush    Patient Profile   77 y.o. female with history of chronic systolic CHF/NICM, CAD, CKD IIIb, restrictive lung disease, OSA, breast cancer s/p lumpectomy/chemo (adriamycin)/XRT.  Admitted with acute on chronic systolic CHF with low-output.  Assessment/Plan   1. Acute on chronic systolic CHF: Primarily nonischemic cardiomyopathy.  Thought to be due to prior Adriamycin chemotherapy (chemo in 2000, cardiomyopathy diagnosed 2001).  She refused ICD in the past.  Cardiac MRI in 3/21 showed LV EF 20%, RV EF 34%, transmural LGE mid-apical anterior wall, possible prior infarction.  Echo in 10/24 showed EF 25-30%, dilated LV, low normal RV function, moderate MR.  RHC 02/23/23 with primarily left-sided failure with mean RA pressure 4, mean PCWP 20, CI 1.63, and preserved PAPi 2.5.  Echo this admit with LVEF 25-30% (looks 25-30% to me), RV okay, moderate to severe MR. NYHA class IV symptoms at home. Now on milrinone for low output, feels considerably better.  Milrinone increased back to 0.25, co-ox 57%.  CVP 14-15 this morning, still with volume overload.  - Continue milrinone 0.25 (was lowered to 0.125 earlier this morning, increase back to 0.25).   - Continue ivabradine 5 mg bid, HR better.  - Lasix 80 mg IV bid + metolazone 2.5 x 1 today.  Replace K aggressively, recheck BMET in pm.  Hopefully can restart torsemide tomorrow at 40 mg daily (will need higher dose).  - Off Toprol XL with low output.  - Currently on 10 mg Farxiga daily, continue.  - GDMT limited by BP and renal function - Poor prognosis with low output HF.  Options are limited.  LVAD would be very high risk at her age and small size. She also states she would not want to pursue LVAD, she wishes to avoid invasive procedures. We discussed Barostim and CCM  devices. These devices have helped with symptoms in patients with moderate heart failure but have not been studied in patients with end stage heart failure who are inotrope dependent.  I do not think that she would get significant benefit from these devices and she would not qualify for barostimulator device regardless with high BNP.  I think that her best option is going to be palliative home milrinone.   - Palliative Care following for GOC discussions. Appreciate input. 2. Sinus tachycardia: Improved on ivabradine.  3. CAD: Cath in 10/24 in setting of NSTEMI showed 80% OM3 stenosis, possibly a thrombotic lesion post-COVID-19 PNA.  No chest pain.   - Continue Eliquis  2.5 mg bid based on weight and creatinine > 1.5 (?thrombotic coronary lesion).  - Continue atorvastatin 40 daily.  4. CKD stage 3: Suspect cardiorenal syndrome in setting of low output HF.  Creatinine 1.65 => 1.59 today with inotropic support.   5. Mitral regurgitation: Echo this admit with moderate to severe MR.  6. 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  - Follow, slowly improving.   GOC:  - Poor prognosis with stage D HF.  - Limited options. Will plan on palliative home milrinone.  - Remains full code. Palliative Care following.  Needs diuresis before discharge.   Length of Stay: 7  Marca Ancona, MD  03/02/2023, 8:03 AM  Advanced Heart Failure Team Pager [phone] (M-F; 7a - 5p)  Please contact [facility] for night-coverage after hours (5p -7a ) and weekends on [email or web address]

## 2023-03-02 NOTE — Plan of Care (Signed)
  Problem: Education: Goal: Individualized Educational Video(s) Outcome: Progressing   Problem: Cardiovascular: Goal: Ability to achieve and maintain adequate cardiovascular perfusion will improve Outcome: Progressing

## 2023-03-02 NOTE — Progress Notes (Signed)
Mobility Specialist Progress Note:    03/02/23 1145  Mobility  Activity Ambulated with assistance in hallway  Level of Assistance Standby assist, set-up cues, supervision of patient - no hands on  Assistive Device Front wheel walker  Distance Ambulated (ft) 300 ft  Activity Response Tolerated well  Mobility Referral Yes  $Mobility charge 1 Mobility  Mobility Specialist Start Time (ACUTE ONLY) 1025  Mobility Specialist Stop Time (ACUTE ONLY) 1037  Mobility Specialist Time Calculation (min) (ACUTE ONLY) 12 min   Received pt in bed having no complaints and agreeable to mobility. C/o slight DOE, but states they are feeling much stronger today. Returned to room w/o fault. Left in bed w/ call bell in reach and all needs met.   Thompson Grayer Mobility Specialist  Please contact vis Secure Chat or  Rehab Office [phone]

## 2023-03-02 NOTE — Evaluation (Signed)
Occupational Therapy Evaluation/Discharge Patient Details Name: Nichole Johnson MRN: [id number] DOB: 06-Feb-1946 Today's Date: 03/02/2023   History of Present Illness 77 year old patient admitted 11/12 presents with recent episodes of non-sustained ventricular tachycardia (VT)/CHF.  Pt treated with medicine.  PMH: non-ischemic cardiomyopathy, acute on chronic systolic heart failure, and coronary artery disease   Clinical Impression   PTA, pt lives with spouse and typically Modified Independent with ADLs, IADLs and mobility with intermittent rollator use. Pt presents now at baseline for ADLs w/ no physical assist needed. Pt able to mobilize in hallway using RW for energy conservation with no safety concerns noted. Educated on energy conservation strategies, task modification for ADLs and community tasks. Provided handout and pt/spouse verbalized understanding. No further skilled OT services needed at this time. Recommend continued OOB activity with mobility specialist while admitted.        If plan is discharge home, recommend the following:  (PRN assist)    Functional Status Assessment  Patient has not had a recent decline in their functional status  Equipment Recommendations  None recommended by OT    Recommendations for Other Services       Precautions / Restrictions Precautions Precautions: Fall Restrictions Weight Bearing Restrictions: No      Mobility Bed Mobility Overal bed mobility: Modified Independent                  Transfers Overall transfer level: Modified independent Equipment used: None, Rolling walker (2 wheels) Transfers: Sit to/from Stand Sit to Stand: Modified independent (Device/Increase time)                  Balance Overall balance assessment: No apparent balance deficits (not formally assessed)                                         ADL either performed or assessed with clinical judgement   ADL Overall ADL's : Needs  assistance/impaired Eating/Feeding: Independent   Grooming: Modified independent;Standing   Upper Body Bathing: Modified independent   Lower Body Bathing: Modified independent   Upper Body Dressing : Modified independent   Lower Body Dressing: Modified independent Lower Body Dressing Details (indicate cue type and reason): able to don socks without issues Toilet Transfer: Supervision/safety;Ambulation;Rolling walker (2 wheels)   Toileting- Clothing Manipulation and Hygiene: Modified independent       Functional mobility during ADLs: Supervision/safety;Rolling walker (2 wheels) General ADL Comments: Educated on energy conservation with handout provided, modifications for valued community tasks and continued use of DME if needed.     Vision Ability to See in Adequate Light: 0 Adequate Patient Visual Report: No change from baseline Vision Assessment?: No apparent visual deficits     Perception         Praxis         Pertinent Vitals/Pain Pain Assessment Pain Assessment: No/denies pain     Extremity/Trunk Assessment Upper Extremity Assessment Upper Extremity Assessment: Overall WFL for tasks assessed   Lower Extremity Assessment Lower Extremity Assessment: Defer to PT evaluation   Cervical / Trunk Assessment Cervical / Trunk Assessment: Normal   Communication Communication Communication: No apparent difficulties   Cognition Arousal: Alert Behavior During Therapy: WFL for tasks assessed/performed Overall Cognitive Status: Within Functional Limits for tasks assessed  General Comments  BP 90s/80s pre and post activity    Exercises     Shoulder Instructions      Home Living Family/patient expects to be discharged to:: Private residence Living Arrangements: Spouse/significant other Available Help at Discharge: Family;Available 24 hours/day Type of Home: House Home Access: Stairs to enter [employer] of Steps: 4 Entrance Stairs-Rails: Right;Left;Can reach both Home Layout: Two level;Bed/bath upstairs Alternate Level Stairs-Number of Steps: has stair lift Alternate Level Stairs-Rails: Right;Left Bathroom Shower/Tub: Producer, television/film/video: Handicapped height Bathroom Accessibility: Yes   Home Equipment: Grab bars - tub/shower;Rolling Environmental consultant (2 wheels);Cane - single Acupuncturist (4 wheels)          Prior Functioning/Environment Prior Level of Function : Independent/Modified Independent             Mobility Comments: typically no AD but occasional use of Rollator if needed ADLs Comments: MOD I with ADLs, iADLs. likes to go shopping        OT Problem List:        OT Treatment/Interventions:      OT Goals(Current goals can be found in the care plan section) Acute Rehab OT Goals Patient Stated Goal: improve SOB, home soon OT Goal Formulation: All assessment and education complete, DC therapy  OT Frequency:      Co-evaluation              AM-PAC OT "6 Clicks" Daily Activity     Outcome Measure Help from another person eating meals?: None Help from another person taking care of personal grooming?: None Help from another person toileting, which includes using toliet, bedpan, or urinal?: None Help from another person bathing (including washing, rinsing, drying)?: None Help from another person to put on and taking off regular upper body clothing?: None Help from another person to put on and taking off regular lower body clothing?: None 6 Click Score: 24   End of Session Equipment Utilized During Treatment: Gait belt;Rolling walker (2 wheels) Nurse Communication: Mobility status  Activity Tolerance: Patient tolerated treatment well Patient left: in bed;with call bell/phone within reach;with family/visitor present  OT Visit Diagnosis: Other (comment) (decreased cardiopulmonary tolerance)                Time:  3235-5732 OT Time Calculation (min): 22 min Charges:  OT General Charges $OT Visit: 1 Visit OT Evaluation $OT Eval Low Complexity: 1 Low  Bradd Canary, OTR/L Acute Rehab Services Office: [phone]   Lorre Munroe 03/02/2023, 9:04 AM

## 2023-03-02 NOTE — Telephone Encounter (Signed)
Pharmacy Patient Advocate Encounter  Received notification from [facility] that Prior Authorization for Ivabradine HCl 5MG  tablets  has been APPROVED from 03/02/2023 to 04/12/2024. Ran test claim, Copay is $95.00. This test claim was processed through [facility]- copay amounts may vary at other pharmacies due to pharmacy/plan contracts, or as the patient moves through the different stages of their insurance plan.   PA #/Case ID/Reference #: [id number]

## 2023-03-03 ENCOUNTER — Institutional Professional Consult (permissible substitution) (HOSPITAL_BASED_OUTPATIENT_CLINIC_OR_DEPARTMENT_OTHER): Payer: Medicare HMO | Admitting: Pulmonary Disease

## 2023-03-03 ENCOUNTER — Telehealth: Payer: Self-pay | Admitting: Cardiology

## 2023-03-03 DIAGNOSIS — I5043 Acute on chronic combined systolic (congestive) and diastolic (congestive) heart failure: Secondary | ICD-10-CM | POA: Diagnosis not present

## 2023-03-03 LAB — MAGNESIUM: Magnesium: 2 mg/dL (ref 1.7–2.4)

## 2023-03-03 LAB — CBC
HCT: 35.2 % — ABNORMAL LOW (ref 36.0–46.0)
Hemoglobin: 11.4 g/dL — ABNORMAL LOW (ref 12.0–15.0)
MCH: 26.1 pg (ref 26.0–34.0)
MCHC: 32.4 g/dL (ref 30.0–36.0)
MCV: 80.7 fL (ref 80.0–100.0)
Platelets: 258 10*3/uL (ref 150–400)
RBC: 4.36 MIL/uL (ref 3.87–5.11)
RDW: 16.2 % — ABNORMAL HIGH (ref 11.5–15.5)
WBC: 6 10*3/uL (ref 4.0–10.5)
nRBC: 0.3 % — ABNORMAL HIGH (ref 0.0–0.2)

## 2023-03-03 LAB — BASIC METABOLIC PANEL
Anion gap: 10 (ref 5–15)
BUN: 35 mg/dL — ABNORMAL HIGH (ref 8–23)
CO2: 25 mmol/L (ref 22–32)
Calcium: 8.5 mg/dL — ABNORMAL LOW (ref 8.9–10.3)
Chloride: 100 mmol/L (ref 98–111)
Creatinine, Ser: 1.75 mg/dL — ABNORMAL HIGH (ref 0.44–1.00)
GFR, Estimated: 29 mL/min — ABNORMAL LOW (ref >60)
Glucose, Bld: 91 mg/dL (ref 70–99)
Potassium: 3.6 mmol/L (ref 3.5–5.1)
Sodium: 135 mmol/L (ref 135–145)

## 2023-03-03 LAB — COOXEMETRY PANEL
Carboxyhemoglobin: 1.1 % (ref 0.5–1.5)
Methemoglobin: <0.7 % (ref 0.0–1.5)
O2 Saturation: 54.1 %
Total hemoglobin: 12.1 g/dL (ref 12.0–16.0)

## 2023-03-03 MED ORDER — TORSEMIDE 20 MG PO TABS
60.0000 mg | ORAL_TABLET | Freq: Every day | ORAL | Status: DC
  Administered 2023-03-03: 60 mg via ORAL
  Filled 2023-03-03: qty 3

## 2023-03-03 MED ORDER — POTASSIUM CHLORIDE CRYS ER 20 MEQ PO TBCR
40.0000 meq | EXTENDED_RELEASE_TABLET | Freq: Once | ORAL | Status: AC
  Administered 2023-03-03: 40 meq via ORAL
  Filled 2023-03-03: qty 2

## 2023-03-03 MED ORDER — POTASSIUM CHLORIDE CRYS ER 20 MEQ PO TBCR
60.0000 meq | EXTENDED_RELEASE_TABLET | Freq: Two times a day (BID) | ORAL | Status: DC
  Administered 2023-03-03: 60 meq via ORAL
  Filled 2023-03-03: qty 3

## 2023-03-03 MED ORDER — POTASSIUM CHLORIDE CRYS ER 20 MEQ PO TBCR
40.0000 meq | EXTENDED_RELEASE_TABLET | Freq: Every day | ORAL | Status: DC
  Administered 2023-03-03: 40 meq via ORAL
  Filled 2023-03-03: qty 2

## 2023-03-03 NOTE — Progress Notes (Addendum)
Patient ID: Nichole Johnson, female   DOB: 1946/04/13, 77 y.o.   MRN: [id number]      Advanced Heart Failure Rounding Note  PCP-Cardiologist: Gypsy Balsam, MD   Subjective:    Co-ox 54% this morning on milrinone 0.25.  Good diuresis yesterday with IV Lasix and metolazone, weight down 4 lbs.  CVP 11 today. Creatinine 1.44 => 1.65 => 1.59 => 1.75.    HR in 90s generally.  Amiodarone was begun given use of milrinone with NSVT runs.   She feels better on milrinone, less fatigue. Getting stronger with her walks.    Objective:   Weight Range: 51.5 kg Body mass index is 22.92 kg/m.   Vital Signs:   Temp:  [97.4 F (36.3 C)-97.8 F (36.6 C)] 97.7 F (36.5 C) (11/20 0721) Pulse Rate:  [83-94] 83 (11/20 0721) Resp:  [16-24] 22 (11/20 0721) BP: (92-105)/(65-80) 105/72 (11/20 0721) SpO2:  [93 %-100 %] 98 % (11/20 0721) Weight:  [51.5 kg] 51.5 kg (11/20 0515) Last BM Date : 03/02/23  Weight change: Filed Weights   03/01/23 0529 03/02/23 0728 03/03/23 0515  Weight: 54.2 kg 53.3 kg 51.5 kg    Intake/Output:   Intake/Output Summary (Last 24 hours) at 03/03/2023 0807 Last data filed at 03/03/2023 0707 Gross per 24 hour  Intake 600 ml  Output 4250 ml  Net -3650 ml      Physical Exam   General: NAD Neck: JVP 8-9 cm, no thyromegaly or thyroid nodule.  Lungs: Clear to auscultation bilaterally with normal respiratory effort. CV: Nondisplaced PMI.  Heart regular S1/S2, no S3/S4, 1/6 HSM apex.  No peripheral edema.  No carotid bruit.  Normal pedal pulses.  Abdomen: Soft, nontender, no hepatosplenomegaly, no distention.  Skin: Intact without lesions or rashes.  Neurologic: Alert and oriented x 3.  Psych: Normal affect. Extremities: No clubbing or cyanosis.  HEENT: Normal.   Telemetry   NSR 90s, short SVT runs noted (personally reviewed)  Labs    CBC Recent Labs    03/02/23 0602 03/03/23 0522  WBC 6.1 6.0  HGB 10.6* 11.4*  HCT 32.4* 35.2*  MCV 81.2 80.7  PLT 222  258   Basic Metabolic Panel Recent Labs    40/98/11 0848 03/02/23 1500 03/03/23 0522  NA  --  134* 135  K  --  3.2* 3.6  CL  --  97* 100  CO2  --  25 25  GLUCOSE  --  85 91  BUN  --  32* 35*  CREATININE  --  2.16* 1.75*  CALCIUM  --  9.0 8.5*  MG 2.1  --  2.0   Liver Function Tests No results for input(s): "AST", "ALT", "ALKPHOS", "BILITOT", "PROT", "ALBUMIN" in the last 72 hours.  No results for input(s): "LIPASE", "AMYLASE" in the last 72 hours. Cardiac Enzymes No results for input(s): "CKTOTAL", "CKMB", "CKMBINDEX", "TROPONINI" in the last 72 hours.  BNP: BNP (last 3 results) Recent Labs    02/19/23 1117 02/21/23 1519 02/23/23 2007  BNP 3,124.7* 2,950.2* 4,190.1*    ProBNP (last 3 results) Recent Labs    09/08/22 1308 01/18/23 1326 02/18/23 1503  PROBNP 5,154* 9,910* 24,266*     D-Dimer No results for input(s): "DDIMER" in the last 72 hours. Hemoglobin A1C No results for input(s): "HGBA1C" in the last 72 hours. Fasting Lipid Panel No results for input(s): "CHOL", "HDL", "LDLCALC", "TRIG", "CHOLHDL", "LDLDIRECT" in the last 72 hours. Thyroid Function Tests No results for input(s): "TSH", "T4TOTAL", "T3FREE", "THYROIDAB" in the  last 72 hours.  Invalid input(s): "FREET3"   Other results:   Imaging    No results found.   Medications:     Scheduled Medications:  amiodarone  400 mg Oral Daily   apixaban  2.5 mg Oral BID   atorvastatin  40 mg Oral Daily   brinzolamide  1 drop Both Eyes BID   budesonide  0.25 mg Nebulization BID   Chlorhexidine Gluconate Cloth  6 each Topical Daily   cyanocobalamin  1,000 mcg Oral Daily   dapagliflozin propanediol  10 mg Oral Daily   ferrous sulfate  325 mg Oral Daily   fluticasone  2 spray Each Nare Daily   ivabradine  5 mg Oral BID WC   latanoprost  1 drop Both Eyes QHS   levothyroxine  100 mcg Oral Daily   potassium chloride  40 mEq Oral Daily   sodium chloride flush  3 mL Intravenous Q12H   torsemide   60 mg Oral Daily    Infusions:  milrinone 0.25 mcg/kg/min (03/03/23 0707)    PRN Medications: acetaminophen, albuterol, ALPRAZolam, ondansetron (ZOFRAN) IV, sodium chloride flush    Patient Profile   77 y.o. female with history of chronic systolic CHF/NICM, CAD, CKD IIIb, restrictive lung disease, OSA, breast cancer s/p lumpectomy/chemo (adriamycin)/XRT.  Admitted with acute on chronic systolic CHF with low-output.  Assessment/Plan   1. Acute on chronic systolic CHF: Primarily nonischemic cardiomyopathy.  Thought to be due to prior Adriamycin chemotherapy (chemo in 2000, cardiomyopathy diagnosed 2001).  She refused ICD in the past.  Cardiac MRI in 3/21 showed LV EF 20%, RV EF 34%, transmural LGE mid-apical anterior wall, possible prior infarction.  Echo in 10/24 showed EF 25-30%, dilated LV, low normal RV function, moderate MR.  RHC 02/23/23 with primarily left-sided failure with mean RA pressure 4, mean PCWP 20, CI 1.63, and preserved PAPi 2.5.  Echo this admit with LVEF 25-30% (looks 25-30% to me), RV okay, moderate to severe MR. NYHA class IV symptoms at home. Now on milrinone for low output, feels considerably better.  On milrinone 0.25, co-ox 54%.  Not ideal co-ox but will not increase.  CVP 11 this morning, down 4 lbs with good diuresis yesterday.  Creatinine up to 1.75. GDMT limited by BP and renal function. - Continue milrinone 0.25, will plan for home.    - Continue ivabradine 5 mg bid, HR better.  - With rise in creatinine, will transition to torsemide 60 mg daily + KCl 40 daily today.  If CVP remains stable to lower, think she can go home tomorrow.  - Off Toprol XL with low output.  - Currently on 10 mg Farxiga daily, continue.  - Poor prognosis with low output HF.  Options are limited.  LVAD would be very high risk at her age and small size. She also states she would not want to pursue LVAD, she wishes to avoid invasive procedures. We discussed Barostim and CCM devices. These  devices have helped with symptoms in patients with moderate heart failure but have not been studied in patients with end stage heart failure who are inotrope dependent.  I do not think that she would get significant benefit from these devices and she would not qualify for barostimulator device regardless with high BNP.  I think that her best option is going to be palliative home milrinone.   - Palliative Care following for GOC discussions. Appreciate input. 2. Sinus tachycardia: Improved on ivabradine.  3. CAD: Cath in 10/24 in setting of  NSTEMI showed 80% OM3 stenosis, possibly a thrombotic lesion post-COVID-19 PNA.  No chest pain.   - Continue Eliquis 2.5 mg bid based on weight and creatinine > 1.5 (?thrombotic coronary lesion).  - Continue atorvastatin 40 daily.  4. CKD stage 3: Suspect cardiorenal syndrome in setting of low output HF.  Creatinine 1.65 => 1.59 => 1.75 today with inotropic support.   - As above, transitioning diuretics to po.  5. Mitral regurgitation: Echo this admit with moderate to severe MR.  6. 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  - Follow, slowly improving.  7. SVT/NSVT: Short runs of both SVT and NSVT.  She is now on amiodarone.   GOC:  - Poor prognosis with stage D HF.  - Limited options. Will plan on palliative home milrinone.  - Remains full code. Palliative Care following.  If CVP remains stable to lower on po torsemide, can go home tomorrow with close followup.   Length of Stay: 8  Marca Ancona, MD  03/03/2023, 8:07 AM  Advanced Heart Failure Team Pager [phone] (M-F; 7a - 5p)  Please contact [facility] for night-coverage after hours (5p -7a ) and weekends on [email or web address]

## 2023-03-03 NOTE — Plan of Care (Signed)
  Problem: Education: Goal: Understanding of CV disease, CV risk reduction, and recovery process will improve Outcome: Progressing   Problem: Activity: Goal: Ability to return to baseline activity level will improve Outcome: Progressing   Problem: Cardiovascular: Goal: Ability to achieve and maintain adequate cardiovascular perfusion will improve Outcome: Progressing   

## 2023-03-03 NOTE — TOC Progression Note (Signed)
Transition of Care [facility]) - Progression Note    Patient Details  Name: Nichole Johnson MRN: [id number] Date of Birth: 1946/04/07  Transition of Care [facility]) CM/SW Contact  Elliot Cousin, RN Phone Number: [phone] 03/03/2023, 3:11 PM  Clinical Narrative:    [facility] rep, Pam aware of possible dc home tomorrow on Milrinone IV, [facility] rep, Haywood Lasso following for [facility]. Attempted to speak to pt at bedside about a custodial aide to assist with ADL's at home. Pt was sleeping. Will speak to pt on tomorrow.      Expected Discharge Plan: Home w Home Health Services Barriers to Discharge: Continued Medical Work up  Expected Discharge Plan and Services   Discharge Planning Services: CM Consult Post Acute Care Choice: Home Health Living arrangements for the past 2 months: Single Family Home Expected Discharge Date: 02/26/23                         HH Arranged: RN HH Agency: [facility], [facility] Date HH Agency Contacted: 03/03/23 Time HH Agency Contacted: 1510 Representative spoke with at [facility] Agency: [facility], [facility]   Social Determinants of Health (SDOH) Interventions SDOH Screenings   Food Insecurity: No Food Insecurity (02/23/2023)  Housing: Low Risk  (02/23/2023)  Transportation Needs: No Transportation Needs (02/23/2023)  Utilities: Not At Risk (02/23/2023)  Social Connections: Unknown (06/27/2022)   Received from [facility]  Tobacco Use: Low Risk  (02/22/2023)    Readmission Risk Interventions    03/03/2023   11:57 AM  Readmission Risk Prevention Plan  Transportation Screening Complete  Medication Review (RN Care Manager) Complete  PCP or Specialist appointment within 3-5 days of discharge Complete  HRI or Home Care Consult Complete  SW Recovery Care/Counseling Consult Complete  Palliative Care Screening Complete  Skilled Nursing Facility Not Applicable

## 2023-03-03 NOTE — Telephone Encounter (Signed)
Records have been faxed x 2 to [facility] with number provided per Spouse and per [facility] fax received.

## 2023-03-03 NOTE — TOC Progression Note (Addendum)
Transition of Care [facility]) - Progression Note    Patient Details  Name: Nichole Johnson MRN: [id number] Date of Birth: 11-12-1945  Transition of Care [facility]) CM/SW Contact  Nicanor Bake Phone Number: [phone] 03/03/2023, 2:42 PM  Clinical Narrative:  [facility] CSW met with pt at bedside.  Pt stated that her and her husband did not want to dc today until she familiarized her self with her new oral meds. Pt stated that she is interested in [facility] nurse aid services. Pt stated that she is concerned with hew having low energy and strength right now. Pt stated that she will need some help with some ADLs. CSW discussed with CM who will meet with a pt at a later time to give [facility] brochures and discuss home milnirone. Pt stated that she received a call from her PCP to schedule her hospital follow up appointment but she declined and said she will schedule once she dc from hospital.    TOC will continue following.     Expected Discharge Plan: Home w Home Health Services Barriers to Discharge: Continued Medical Work up  Expected Discharge Plan and Services   Discharge Planning Services: CM Consult Post Acute Care Choice: Home Health Living arrangements for the past 2 months: Single Family Home Expected Discharge Date: 02/26/23                         HH Arranged: RN HH Agency: [facility], [facility] Date HH Agency Contacted: 02/26/23 Time HH Agency Contacted: [phone] Representative spoke with at [facility] Agency: [facility], Lacie Scotts rep, Lynette   Social Determinants of Health (SDOH) Interventions SDOH Screenings   Food Insecurity: No Food Insecurity (02/23/2023)  Housing: Low Risk  (02/23/2023)  Transportation Needs: No Transportation Needs (02/23/2023)  Utilities: Not At Risk (02/23/2023)  Social Connections: Unknown (06/27/2022)   Received from [facility]  Tobacco Use: Low Risk  (02/22/2023)    Readmission Risk Interventions    03/03/2023   11:57 AM   Readmission Risk Prevention Plan  Transportation Screening Complete  Medication Review (RN Care Manager) Complete  PCP or Specialist appointment within 3-5 days of discharge Complete  HRI or Home Care Consult Complete  SW Recovery Care/Counseling Consult Complete  Palliative Care Screening Complete  Skilled Nursing Facility Not Applicable

## 2023-03-03 NOTE — Telephone Encounter (Signed)
  Pt's husband called, he said, [facility] did not receive the referral yesterday and he is requesting to refax it today. He gave a new fax number [phone]. He requested if this can be done as soon as possible today

## 2023-03-03 NOTE — Progress Notes (Signed)
Mobility Specialist Progress Note:    03/03/23 0949  Mobility  Activity Ambulated with assistance in hallway  Level of Assistance Standby assist, set-up cues, supervision of patient - no hands on  Assistive Device Front wheel walker  Distance Ambulated (ft) 500 ft  Activity Response Tolerated well  Mobility Referral Yes  $Mobility charge 1 Mobility  Mobility Specialist Start Time (ACUTE ONLY) 0930  Mobility Specialist Stop Time (ACUTE ONLY) [id number]  Mobility Specialist Time Calculation (min) (ACUTE ONLY) 12 min   Received pt in bed having no complaints and agreeable to mobility. Pt was asymptomatic throughout ambulation, no SOB. Returned to room w/o fault. Left in bed w/ call bell in reach and all needs met.   Thompson Grayer Mobility Specialist  Please contact vis Secure Chat or  Rehab Office [phone]

## 2023-03-04 ENCOUNTER — Other Ambulatory Visit: Payer: Self-pay

## 2023-03-04 ENCOUNTER — Other Ambulatory Visit (HOSPITAL_COMMUNITY): Payer: Self-pay

## 2023-03-04 DIAGNOSIS — I5043 Acute on chronic combined systolic (congestive) and diastolic (congestive) heart failure: Secondary | ICD-10-CM | POA: Diagnosis not present

## 2023-03-04 LAB — BASIC METABOLIC PANEL
Anion gap: 10 (ref 5–15)
BUN: 36 mg/dL — ABNORMAL HIGH (ref 8–23)
CO2: 27 mmol/L (ref 22–32)
Calcium: 8.7 mg/dL — ABNORMAL LOW (ref 8.9–10.3)
Chloride: 99 mmol/L (ref 98–111)
Creatinine, Ser: 1.87 mg/dL — ABNORMAL HIGH (ref 0.44–1.00)
GFR, Estimated: 27 mL/min — ABNORMAL LOW (ref >60)
Glucose, Bld: 113 mg/dL — ABNORMAL HIGH (ref 70–99)
Potassium: 3.6 mmol/L (ref 3.5–5.1)
Sodium: 136 mmol/L (ref 135–145)

## 2023-03-04 LAB — COOXEMETRY PANEL
Carboxyhemoglobin: 1 % (ref 0.5–1.5)
Carboxyhemoglobin: 1.3 % (ref 0.5–1.5)
Methemoglobin: <0.7 % (ref 0.0–1.5)
Methemoglobin: <0.7 % (ref 0.0–1.5)
O2 Saturation: 49.6 %
O2 Saturation: 49.7 %
Total hemoglobin: 12.3 g/dL (ref 12.0–16.0)
Total hemoglobin: 13.3 g/dL (ref 12.0–16.0)

## 2023-03-04 LAB — CBC
HCT: 37.1 % (ref 36.0–46.0)
Hemoglobin: 12 g/dL (ref 12.0–15.0)
MCH: 26.5 pg (ref 26.0–34.0)
MCHC: 32.3 g/dL (ref 30.0–36.0)
MCV: 82.1 fL (ref 80.0–100.0)
Platelets: 273 10*3/uL (ref 150–400)
RBC: 4.52 MIL/uL (ref 3.87–5.11)
RDW: 16.9 % — ABNORMAL HIGH (ref 11.5–15.5)
WBC: 6.8 10*3/uL (ref 4.0–10.5)
nRBC: 0.4 % — ABNORMAL HIGH (ref 0.0–0.2)

## 2023-03-04 LAB — MAGNESIUM: Magnesium: 2.1 mg/dL (ref 1.7–2.4)

## 2023-03-04 MED ORDER — APIXABAN 2.5 MG PO TABS
2.5000 mg | ORAL_TABLET | Freq: Two times a day (BID) | ORAL | 5 refills | Status: DC
  Filled 2023-03-04: qty 60, 30d supply, fill #0

## 2023-03-04 MED ORDER — AMIODARONE HCL 200 MG PO TABS
400.0000 mg | ORAL_TABLET | Freq: Every day | ORAL | 5 refills | Status: DC
  Filled 2023-03-04: qty 60, 52d supply, fill #0

## 2023-03-04 MED ORDER — TORSEMIDE 20 MG PO TABS
40.0000 mg | ORAL_TABLET | Freq: Every day | ORAL | 5 refills | Status: DC
  Filled 2023-03-04: qty 60, 30d supply, fill #0

## 2023-03-04 MED ORDER — POTASSIUM CHLORIDE CRYS ER 20 MEQ PO TBCR
40.0000 meq | EXTENDED_RELEASE_TABLET | Freq: Two times a day (BID) | ORAL | Status: DC
  Administered 2023-03-04: 40 meq via ORAL
  Filled 2023-03-04: qty 2

## 2023-03-04 MED ORDER — POTASSIUM CHLORIDE CRYS ER 20 MEQ PO TBCR
40.0000 meq | EXTENDED_RELEASE_TABLET | Freq: Every day | ORAL | 0 refills | Status: DC
  Filled 2023-03-04: qty 720, 360d supply, fill #0

## 2023-03-04 MED ORDER — TORSEMIDE 20 MG PO TABS
40.0000 mg | ORAL_TABLET | Freq: Every day | ORAL | Status: DC
  Administered 2023-03-04: 40 mg via ORAL
  Filled 2023-03-04: qty 2

## 2023-03-04 MED ORDER — MILRINONE LACTATE IN DEXTROSE 20-5 MG/100ML-% IV SOLN: 0.2500 ug/kg/min | INTRAVENOUS | Status: DC

## 2023-03-04 MED ORDER — IVABRADINE HCL 5 MG PO TABS
5.0000 mg | ORAL_TABLET | Freq: Two times a day (BID) | ORAL | 5 refills | Status: DC
  Filled 2023-03-04: qty 60, 30d supply, fill #0

## 2023-03-04 NOTE — Progress Notes (Signed)
Patient and family has received discharge instructions, TOC meds, and follow up appointments. They verbalize understanding of information provided.

## 2023-03-04 NOTE — Progress Notes (Signed)
Peripherally Inserted Central Catheter Placement  The IV Nurse has discussed with the patient and/or persons authorized to consent for the patient, the purpose of this procedure and the potential benefits and risks involved with this procedure.  The benefits include less needle sticks, lab draws from the catheter, and the patient may be discharged home with the catheter. Risks include, but not limited to, infection, bleeding, blood clot (thrombus formation), and puncture of an artery; nerve damage and irregular heartbeat and possibility to perform a PICC exchange if needed/ordered by physician.  Alternatives to this procedure were also discussed.  Bard Power PICC patient education guide, fact sheet on infection prevention and patient information card has been provided to patient /or left at bedside.    PICC Placement Documentation  PICC Single Lumen 03/04/23 Left Brachial 37 cm 0 cm (Active)  Indication for Insertion or Continuance of Line Home intravenous therapies (PICC only) 03/04/23 1200  Exposed Catheter (cm) 0 cm 03/04/23 1200  Site Assessment Clean, Dry, Intact 03/04/23 1200  Line Status Flushed;Saline locked;Blood return noted 03/04/23 1200  Dressing Type Securing device 03/04/23 1200  Dressing Status Antimicrobial disc in place;Clean, Dry, Intact 03/04/23 1200  Line Care Connections checked and tightened 03/04/23 1200  Line Adjustment (NICU/IV Team Only) No 03/04/23 1200  Dressing Intervention New dressing;Adhesive placed at insertion site (IV team only);Other (Comment) 03/04/23 1200  Dressing Change Due 03/11/23 03/04/23 1200    PICC exchanged from DL to SL for home milrinone, CDI.    Annett Fabian 03/04/2023, 12:01 PM

## 2023-03-04 NOTE — Discharge Summary (Signed)
Advanced Heart Failure Team  Discharge Summary   Patient ID: Nichole Johnson MRN: [id number], DOB/AGE: 1945/11/17 77 y.o. Admit date: 02/23/2023 D/C date:     03/04/2023   Primary Discharge Diagnoses:  Acute on chronic systolic CHF Sinus tachycardia CAD CKD stage 3 Mitral regurgitation SVT/NSVT  Hospital Course:  Nichole Johnson is a 77 y.o. female with history of chronic systolic CHF/NICM, CAD, CKD IIIb, restrictive lung disease, OSA, breast cancer s/p lumpectomy/chemo (adriamycin)/XRT.    She was seen in clinic earlier this month with NYHA IV symptoms and was not tolerating GDMT. She was brought in for a RHC which showed low cardiac index (1.63) and elevated PCWP. She was then admitted and started on milrinone. Tachycardic with some NSVT during admission from milrinone so she was started on ivabradine and amiodarone, HR improved. Milrinone wean attempted but she failed. Numerous discussions with patient and family about patients poor prognosis with low output HF.  Options are limited.  LVAD would be very high risk at her age and small size. She also states she would not want to pursue LVAD, she wishes to avoid invasive procedures. We discussed Barostim and CCM devices. These devices have helped with symptoms in patients with moderate heart failure but have not been studied in patients with end stage heart failure who are inotrope dependent.  We did not think that she would get significant benefit from these devices and she would not qualify for barostimulator device regardless with high BNP.  EP was consulted to discuss possibility of CCM device and was deemed not a candidate with end stage HF. Palliative care team followed patient during admission. Ultimate decision was for home with palliative inotrope's.    DL PICC switched for SL PICC. Jeri Modena hooked patient up to home infusion device. Discharge meds received from [facility].   Dr Shirlee Latch evaluated and deemed appropriate for discharge. Close f/u  arranged with Dr. Shirlee Latch at [facility] AHF clinic, will continue to follow closely.   See below for detailed problem list: 1. Acute on chronic systolic CHF: Primarily nonischemic cardiomyopathy.  Thought to be due to prior Adriamycin chemotherapy (chemo in 2000, cardiomyopathy diagnosed 2001).  She refused ICD in the past.  Cardiac MRI in 3/21 showed LV EF 20%, RV EF 34%, transmural LGE mid-apical anterior wall, possible prior infarction.  Echo in 10/24 showed EF 25-30%, dilated LV, low normal RV function, moderate MR.  RHC 02/23/23 with primarily left-sided failure with mean RA pressure 4, mean PCWP 20, CI 1.63, and preserved PAPi 2.5.  Echo this admit with LVEF 25-30% (looks 25-30% to me), RV okay, moderate to severe MR. NYHA class IV symptoms at home. Now on milrinone for low output, feels considerably better.  On milrinone 0.25, co-ox 50% early am.  Not ideal co-ox but will not increase milrinone with NSVT, will repeat co-ox now that she is up and about.  CVP 8-9 this morning, down 1 lb and now on torsemide.  Creatinine up incrementally to 1.87. GDMT limited by BP and renal function. - Continue milrinone 0.25, will plan for home infusion for palliation.    - Continue ivabradine 5 mg bid, HR better.  - Will send home on torsemide 40 mg daily with KCl 40 mEq daily.  She can increase torsemide to 60 mg daily if weight rises.  - Off Toprol XL with low output.  - Continue 10 mg Farxiga daily.  - Poor prognosis with low output HF.  Options are limited.  LVAD would be very high  risk at her age and small size. She also states she would not want to pursue LVAD, she wishes to avoid invasive procedures. We discussed Barostim and CCM devices. These devices have helped with symptoms in patients with moderate heart failure but have not been studied in patients with end stage heart failure who are inotrope dependent.  I do not think that she would get significant benefit from these devices and she would not qualify for  barostimulator device regardless with high BNP.  I think that her best option is going to be palliative home milrinone.   - Palliative Care following for GOC discussions. Appreciate input. 2. Sinus tachycardia: Improved on ivabradine.  3. CAD: Cath in 10/24 in setting of NSTEMI showed 80% OM3 stenosis, possibly a thrombotic lesion post-COVID-19 PNA.  No chest pain.   - Continue Eliquis 2.5 mg bid based on weight and creatinine > 1.5 (?thrombotic coronary lesion).  - Continue atorvastatin 40 daily.  4. CKD stage 3: Suspect cardiorenal syndrome in setting of low output HF.  Creatinine 1.65 => 1.59 => 1.75 => 1.87 today with inotropic support.    5. Mitral regurgitation: Echo this admit with moderate to severe MR.  6. 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  - Follow, slowly improving.  7. SVT/NSVT: Short runs of both SVT and NSVT.  She is now on amiodarone.  8. Disposition: Home today. Meds for home: Milrinone 0.25 infusion, torsemide 40 mg daily, KCl 40 mEq daily, Farxiga 10 mg daily, ivabradine 5 mg bid, amiodarone 400 mg daily x 1 week then 200 mg daily after that, atorvastatin 40, apixaban 2.5 bid.     Discharge Weight Range: 112 lbs Discharge Vitals: Blood pressure 96/68, pulse 79, temperature 97.8 F (36.6 C), temperature source Oral, resp. rate 18, height 4\' 11"  (1.499 m), weight 50.9 kg, SpO2 99%.  Labs: Lab Results  Component Value Date   WBC 6.8 03/04/2023   HGB 12.0 03/04/2023   HCT 37.1 03/04/2023   MCV 82.1 03/04/2023   PLT 273 03/04/2023    Recent Labs  Lab 03/04/23 0535  NA 136  K 3.6  CL 99  CO2 27  BUN 36*  CREATININE 1.87*  CALCIUM 8.7*  GLUCOSE 113*   Lab Results  Component Value Date   CHOL 129 01/23/2023   HDL 57 01/23/2023   LDLCALC 58 01/23/2023   TRIG 69 01/23/2023   BNP (last 3 results) Recent Labs    02/19/23 1117 02/21/23 1519 02/23/23 2007  BNP 3,124.7* 2,950.2* 4,190.1*    ProBNP (last 3 results) Recent Labs    09/08/22 1308  01/18/23 1326 02/18/23 1503  PROBNP 5,154* 9,910* 24,266*     Diagnostic Studies/Procedures   [place] EKG Site Rite  Result Date: 03/04/2023 If Site Rite image not attached, placement could not be confirmed due to current cardiac rhythm.   RHC showed low cardiac index (1.63) and elevatd PCWP.  RA mean 4 RV 33/9 PA 32/22, mean 29 PCWP mean 20 Oxygen saturations: PA 56% AO 100% Cardiac Output (Fick) 2.38  Cardiac Index (Fick) 1.63 PVR 3.8 WU PAPi 2.5    Echo 02/24/23 LVEF 25-30%, RV okay, moderate to severe MR.   Discharge Medications   Allergies as of 03/04/2023       Reactions   Penicillins Itching, Other (See Comments)   Vulvovaginal Candidiasis with associated pruritis Vaginal Infections Vaginal Infections    Vulvovaginal Candidiasis with associated pruritis    Vulvovaginal Candidiasis with associated pruritis Vaginal Infections Vaginal Infections  Sulfamethoxazole-trimethoprim Other (See Comments)   Makes her shaky and sweat   Tramadol Other (See Comments)   Makes her shaky and sweat        Medication List     STOP taking these medications    metoprolol succinate 25 MG 24 hr tablet Commonly known as: TOPROL-XL       TAKE these medications    albuterol 108 (90 Base) MCG/ACT inhaler Commonly known as: VENTOLIN HFA Inhale 2 puffs into the lungs every 6 (six) hours as needed for wheezing or shortness of breath.   alendronate 35 MG tablet Commonly known as: FOSAMAX Take 35 mg by mouth once a week.   ALPRAZolam 0.25 MG tablet Commonly known as: XANAX Take 1 tablet (0.25 mg total) by mouth every 8 (eight) hours as needed for anxiety.   amiodarone 200 MG tablet Commonly known as: PACERONE Take 400 mg (2 tablets) by mouth daily until 11/28, then only take 200 mg (1 tablet) daily.   Arnuity Ellipta 100 MCG/ACT Aepb Generic drug: Fluticasone Furoate Inhale 1 puff into the lungs daily.   ascorbic acid 500 MG tablet Commonly known as: VITAMIN  C Take 500 mg by mouth daily.   atorvastatin 40 MG tablet Commonly known as: LIPITOR Take 1 tablet (40 mg total) by mouth daily.   Biotin 5000 MCG Tabs Take 1 tablet by mouth daily.   brinzolamide 1 % ophthalmic suspension Commonly known as: AZOPT Place 1 drop into both eyes 2 (two) times daily.   CALCIUM + D PO Take 1 tablet by mouth 2 (two) times daily.   CENTRUM SILVER PO Take 1 tablet by mouth every morning.   Cholecalciferol 25 MCG (1000 UT) tablet Take 1,000 Units by mouth daily.   cyanocobalamin 1000 MCG tablet Commonly known as: VITAMIN B12 Take 1,000 mcg by mouth daily.   dapagliflozin propanediol 10 MG Tabs tablet Commonly known as: FARXIGA Take 1 tablet (10 mg total) by mouth daily.   Eliquis 2.5 MG Tabs tablet Generic drug: apixaban Take 1 tablet (2.5 mg total) by mouth 2 (two) times daily. What changed:  medication strength how much to take   ferrous sulfate 325 (65 FE) MG tablet Take 325 mg by mouth daily.   fluticasone 50 MCG/ACT nasal spray Commonly known as: FLONASE Place 2 sprays into both nostrils daily.   GENTEAL OP Place 1 drop into both eyes in the morning, at noon, in the evening, and at bedtime.   ivabradine 5 MG Tabs tablet Commonly known as: CORLANOR Take 1 tablet (5 mg total) by mouth 2 (two) times daily with a meal.   latanoprost 0.005 % ophthalmic solution Commonly known as: XALATAN Place 1 drop into both eyes at bedtime.   levothyroxine 100 MCG tablet Commonly known as: SYNTHROID Take 100 mcg by mouth daily.   Loratadine 10 MG Caps Take 10 mg by mouth daily.   milrinone 20 MG/100 ML Soln infusion Commonly known as: PRIMACOR Inject 0.0129 mg/min into the vein continuous.   nitroGLYCERIN 0.4 MG SL tablet Commonly known as: NITROSTAT Place 1 tablet (0.4 mg total) under the tongue every 5 (five) minutes as needed for chest pain (up to 3 doses).   potassium chloride SA 20 MEQ tablet Commonly known as: KLOR-CON M Take 2  tablets (40 mEq total) by mouth daily. What changed: when to take this   torsemide 20 MG tablet Commonly known as: DEMADEX Take 2 tablets (40 mg total) by mouth daily. What changed:  See the new  instructions. Another medication with the same name was removed. Continue taking this medication, and follow the directions you see here.   Travoprost (BAK Free) 0.004 % Soln ophthalmic solution Commonly known as: TRAVATAN Place 1 drop into both eyes at bedtime.               Durable Medical Equipment  (From admission, onward)           Start     Ordered   03/03/23 1528  Heart failure home health orders  (Heart failure home health orders / Face to face)  Once       Comments: Heart Failure Follow-up Care:  Verify follow-up appointments per Patient Discharge Instructions. Confirm transportation arranged. Reconcile home medications with discharge medication list. Remove discontinued medications from use. Assist patient/caregiver to manage medications using pill box. Reinforce low sodium food selection Assessments: Vital signs and oxygen saturation at each visit. Assess home environment for safety concerns, caregiver support and availability of low-sodium foods. Consult Child psychotherapist, PT/OT, Dietitian, and CNA based on assessments. Perform comprehensive cardiopulmonary assessment. Notify MD for any change in condition or weight gain of 3 pounds in one day or 5 pounds in one week with symptoms. Daily Weights and Symptom Monitoring: Ensure patient has access to scales. Teach patient/caregiver to weigh daily before breakfast and after voiding using same scale and record.    Teach patient/caregiver to track weight and symptoms and when to notify Provider. Activity: Develop individualized activity plan with patient/caregiver.  [facility] to provide  Labs every other week to include BMET, Mg, and CBC with Diff. Additional as needed. Should be drawn via PERIPHERAL stick. NOT PICC line.    W0981 Milrinone 0.25 mcg/kg/min X 52 weeks A4221 Supplies for maintenance of drug infusion catheter A4222 Supplies for the external drug infusion per cassette or bag E0781 Ambulatory Infusion pump  Question Answer Comment  Heart Failure Follow-up Care Advanced Heart Failure (AHF) Clinic at [phone]   Obtain the following labs Other see comments   Lab frequency Weekly   Fax lab results to AHF Clinic at [phone]   Diet Low Sodium Heart Healthy   Fluid restrictions: 1500 mL Fluid   Skilled Nurse to notify MD of weight trends weekly for first 2 weeks. May fax or call: AHF Clinic at [phone] (fax) or [phone]      03/03/23 1527            Disposition   The patient will be discharged in stable condition to home. Discharge Instructions     (HEART FAILURE PATIENTS) Call MD:  Anytime you have any of the following symptoms: 1) 3 pound weight gain in 24 hours or 5 pounds in 1 week 2) shortness of breath, with or without a dry hacking cough 3) swelling in the hands, feet or stomach 4) if you have to sleep on extra pillows at night in order to breathe.   Complete by: As directed    Diet - low sodium heart healthy   Complete by: As directed    Increase activity slowly   Complete by: As directed        Follow-up Information     [facility] Follow up.   Why: Home Health RN and Physical Therapy-agency will call to schedule appt Contact information: [phone]        [name], MD Follow up.   Specialty: Internal Medicine Why: hospital follow up appt for 03/09/2023 at 10:00 am, please call if you are unable  to keep appt to reschedule Contact information: [address] [place] [address] [phone]         [facility] Follow up.   Why: Home Infusion will contact you with medication deliveries Contact information: [phone]        [facility] Follow up on 03/09/2023.    Specialty: Cardiology Why: Follow up with Dr. Shirlee Latch at [facility] advanced heart failure clinic 11/26 at 8:45am Contact information: [address] [place] [address] [phone]                  Duration of Discharge Encounter: Greater than 35 minutes   Signed, Alen Bleacher  03/04/2023, 12:37 PM

## 2023-03-04 NOTE — Progress Notes (Signed)
Physical Therapy Treatment Patient Details Name: Nichole Johnson MRN: [id number] DOB: 1946-01-16 Today's Date: 03/04/2023   History of Present Illness 77 year old patient admitted 11/12 presents with recent episodes of non-sustained ventricular tachycardia (VT)/CHF.  Pt treated with medicine.  PMH: non-ischemic cardiomyopathy, acute on chronic systolic heart failure, and coronary artery disease    PT Comments  Focused session on gait training without an AD to progress her towards her baseline and on stair training in preparation for d/c home soon. Pt was able to navigate stairs slowly and cautiously with bil handrail support at a CGA level without LOB. She did display a tendency to drift/sway laterally (more to the R) when ambulating without UE support on even surfaces, intermittently needing minA to maintain her balance. Educated pt and her husband on healthy eating, increasing intake gradually as able, mobility progression, guarding pt on stairs and when trying to ambulate without UE support with gait belt provided, to use RW when alone, and HEP handout provided with [employer] Access Code: [id number]. They verbalized understanding of all education. Will continue to follow acutely.   If plan is discharge home, recommend the following:  (PRN assist)   Can travel by private vehicle        Equipment Recommendations  Wheelchair (measurements PT);Wheelchair cushion (measurements PT) (CM - please let pt family know cost of wheelchair)    Recommendations for Other Services       Precautions / Restrictions Precautions Precautions:  (low fall risk) Restrictions Weight Bearing Restrictions: No     Mobility  Bed Mobility Overal bed mobility: Modified Independent             General bed mobility comments: Pt able to transition supine to sit EOB without assistance, HOB elevated    Transfers Overall transfer level: Needs assistance Equipment used: None Transfers: Sit to/from Stand Sit to  Stand: Contact guard assist           General transfer comment: CGA for safety standing from EOB, no LOB, pt slow and cautious    Ambulation/Gait Ambulation/Gait assistance: Contact guard assist, Min assist Gait Distance (Feet): 200 Feet Assistive device: None Gait Pattern/deviations: Step-through pattern, Decreased stride length, Drifts right/left Gait velocity: reduced Gait velocity interpretation: 1.31 - 2.62 ft/sec, indicative of limited community ambulator   General Gait Details: Pt ambulated without UE support, displaying intermittent lateral swaying/staggering (often to the R). Pt needed intermittent light minA to maintain her balance.   Stairs Stairs: Yes Stairs assistance: Contact guard assist Stair Management: Two rails, Alternating pattern, Step to pattern, Forwards Number of Stairs: 8 General stair comments: Ascends with reciprocal steps and bil handrail support. Descends with step-to pattern and bil handrail support. No LOB, CGA for safety   Wheelchair Mobility     Tilt Bed    Modified Rankin (Stroke Patients Only)       Balance Overall balance assessment: Needs assistance Sitting-balance support: No upper extremity supported, Feet supported Sitting balance-Leahy Scale: Good     Standing balance support: No upper extremity supported, During functional activity Standing balance-Leahy Scale: Fair Standing balance comment: Mild sway/stagger noted when ambulating without UE support, minA intermittently to maintain balance                            Cognition Arousal: Alert Behavior During Therapy: WFL for tasks assessed/performed Overall Cognitive Status: Within Functional Limits for tasks assessed  Exercises      General Comments General comments (skin integrity, edema, etc.): educated pt and her husband on healthy eating, increasing intake gradually as able, mobility progression,  guarding pt on stairs and when trying to ambulate without UE support with gait belt provided, to use RW when alone, and HEP handout provided with [employer] Access Code: [email or web address] - they verbalized understanding of all education      Pertinent Vitals/Pain Pain Assessment Pain Assessment: Faces Faces Pain Scale: No hurt Pain Intervention(s): Monitored during session    Home Living                          Prior Function            PT Goals (current goals can now be found in the care plan section) Acute Rehab PT Goals Patient Stated Goal: to be independent without AD PT Goal Formulation: With patient/family Time For Goal Achievement: 03/13/23 Potential to Achieve Goals: Good Progress towards PT goals: Progressing toward goals    Frequency    Min 1X/week      PT Plan      Co-evaluation              AM-PAC PT "6 Clicks" Mobility   Outcome Measure  Help needed turning from your back to your side while in a flat bed without using bedrails?: None Help needed moving from lying on your back to sitting on the side of a flat bed without using bedrails?: None Help needed moving to and from a bed to a chair (including a wheelchair)?: A Little Help needed standing up from a chair using your arms (e.g., wheelchair or bedside chair)?: A Little Help needed to walk in hospital room?: A Little Help needed climbing 3-5 steps with a railing? : A Little 6 Click Score: 20    End of Session Equipment Utilized During Treatment: Gait belt Activity Tolerance: Patient tolerated treatment well Patient left: with call bell/phone within reach;with family/visitor present;in bed   PT Visit Diagnosis: Difficulty in walking, not elsewhere classified (R26.2);Unsteadiness on feet (R26.81);Other abnormalities of gait and mobility (R26.89)     Time: 2355-7322 PT Time Calculation (min) (ACUTE ONLY): 23 min  Charges:    $Gait Training: 8-22 mins $Therapeutic Activity: 8-22 mins PT  General Charges $$ ACUTE PT VISIT: 1 Visit                     Virgil Benedict, PT, DPT Acute Rehabilitation Services  Office: [phone]    Nichole Johnson 03/04/2023, 10:59 AM

## 2023-03-04 NOTE — TOC Transition Note (Signed)
Transition of Care [facility]) - CM/SW Discharge Note   Patient Details  Name: Hamida Jiminez MRN: [id number] Date of Birth: 10-20-1945  Transition of Care [facility]) CM/SW Contact:  Nicanor Bake Phone Number: [phone] 03/04/2023, 1:30 PM   Clinical Narrative:   HF CSW met with pt and husband at bedside. Pt stated that her husband will be transporting her home. Pt stated that there were brouchers left at bedside yesterday while she was sleeping and inquired about the nurse aide services being provided. CSW answered the pts questions and pt stated that she believes her husband and son can manage.     Final next level of care: Home w Home Health Services Barriers to Discharge: Continued Medical Work up   Patient Goals and CMS Choice CMS Medicare.gov Compare Post Acute Care list provided to:: Patient Choice offered to / list presented to : Patient  Discharge Placement                         Discharge Plan and Services Additional resources added to the After Visit Summary for     Discharge Planning Services: CM Consult Post Acute Care Choice: Home Health                    HH Arranged: RN [facility] Agency: [facility], [facility] Date [facility] Agency Contacted: 03/03/23 Time HH Agency Contacted: 1510 Representative spoke with at [facility] Agency: [facility], [facility]  Social Determinants of Health (SDOH) Interventions SDOH Screenings   Food Insecurity: No Food Insecurity (02/23/2023)  Housing: Low Risk  (02/23/2023)  Transportation Needs: No Transportation Needs (02/23/2023)  Utilities: Not At Risk (02/23/2023)  Social Connections: Unknown (06/27/2022)   Received from [facility], [facility]  Tobacco Use: Low Risk  (02/22/2023)     Readmission Risk Interventions    03/03/2023   11:57 AM  Readmission Risk Prevention Plan  Transportation Screening Complete  Medication Review (RN Care Manager) Complete  PCP or Specialist appointment within  3-5 days of discharge Complete  HRI or Home Care Consult Complete  SW Recovery Care/Counseling Consult Complete  Palliative Care Screening Complete  Skilled Nursing Facility Not Applicable

## 2023-03-04 NOTE — TOC Transition Note (Addendum)
Transition of Care [facility]) - CM/SW Discharge Note   Patient Details  Name: Shanta Calvey MRN: [id number] Date of Birth: 21-Feb-1946  Transition of Care [facility]) CM/SW Contact:  Leone Haven, RN Phone Number: 03/04/2023, 10:25 AM   Clinical Narrative:    For dc today, Per Pam with [facility] states the team informed her they want to switch out the picc line, so if the picc line is not switched out before 12 noon, she will probably have to hook patient up around 4 or 5 pm today, she was hoping to hook patient up a little sooner. Whenever she hooks patient up  , then patient will be good to discharge.  Picc team here to change line, before , so patient will be able to be discharged earlier.   Final next level of care: Home w Home Health Services Barriers to Discharge: Continued Medical Work up   Patient Goals and CMS Choice CMS Medicare.gov Compare Post Acute Care list provided to:: Patient Choice offered to / list presented to : Patient  Discharge Placement                         Discharge Plan and Services Additional resources added to the After Visit Summary for     Discharge Planning Services: CM Consult Post Acute Care Choice: Home Health                    HH Arranged: RN [facility] Agency: [facility], [facility] Date [employer] Agency Contacted: 03/03/23 Time HH Agency Contacted: 1510 Representative spoke with at [facility] Agency: [facility], [facility]  Social Determinants of Health (SDOH) Interventions SDOH Screenings   Food Insecurity: No Food Insecurity (02/23/2023)  Housing: Low Risk  (02/23/2023)  Transportation Needs: No Transportation Needs (02/23/2023)  Utilities: Not At Risk (02/23/2023)  Social Connections: Unknown (06/27/2022)   Received from [facility], [facility]  Tobacco Use: Low Risk  (02/22/2023)     Readmission Risk Interventions    03/03/2023   11:57 AM  Readmission Risk Prevention Plan  Transportation Screening  Complete  Medication Review (RN Care Manager) Complete  PCP or Specialist appointment within 3-5 days of discharge Complete  HRI or Home Care Consult Complete  SW Recovery Care/Counseling Consult Complete  Palliative Care Screening Complete  Skilled Nursing Facility Not Applicable

## 2023-03-04 NOTE — Progress Notes (Signed)
Patient ID: Nichole Johnson, female   DOB: 07/02/1945, 77 y.o.   MRN: [id number]      Advanced Heart Failure Rounding Note  PCP-Cardiologist: Nichole Balsam, MD   Subjective:    Co-ox 50% early am on milrinone 0.25.  Good diuresis yesterday with IV Lasix and metolazone, weight down 1 lb.  CVP 8-9 today. Creatinine 1.44 => 1.65 => 1.59 => 1.75 => 1.87.    HR in 90s generally.  Amiodarone was begun given use of milrinone with NSVT runs.  She had one run NSVT yesterday afternoon.   She feels better on milrinone, less fatigue. Able to walk the whole hall now.   Objective:   Weight Range: 50.9 kg Body mass index is 22.68 kg/m.   Vital Signs:   Temp:  [97.3 F (36.3 C)-97.8 F (36.6 C)] 97.8 F (36.6 C) (11/21 0430) Pulse Rate:  [79-100] 79 (11/21 0430) Resp:  [16-20] 18 (11/21 0430) BP: (84-104)/(53-79) 96/68 (11/21 0430) SpO2:  [96 %-100 %] 99 % (11/21 0430) Weight:  [50.9 kg] 50.9 kg (11/21 0430) Last BM Date : 03/03/23  Weight change: Filed Weights   03/02/23 0728 03/03/23 0515 03/04/23 0430  Weight: 53.3 kg 51.5 kg 50.9 kg    Intake/Output:   Intake/Output Summary (Last 24 hours) at 03/04/2023 0739 Last data filed at 03/04/2023 0500 Gross per 24 hour  Intake 770 ml  Output 2150 ml  Net -1380 ml      Physical Exam   General: NAD Neck: JVP 8 cm, no thyromegaly or thyroid nodule.  Lungs: Clear to auscultation bilaterally with normal respiratory effort. CV: Nondisplaced PMI.  Heart regular S1/S2, no S3/S4, 1/6 HSM apex.  No peripheral edema.  No carotid bruit.  Normal pedal pulses.  Abdomen: Soft, nontender, no hepatosplenomegaly, no distention.  Skin: Intact without lesions or rashes.  Neurologic: Alert and oriented x 3.  Psych: Normal affect. Extremities: No clubbing or cyanosis.  HEENT: Normal.   Telemetry   NSR 90s, 1 NSVT run noted (personally reviewed)  Labs    CBC Recent Labs    03/03/23 0522 03/04/23 0535  WBC 6.0 6.8  HGB 11.4* 12.0  HCT  35.2* 37.1  MCV 80.7 82.1  PLT 258 273   Basic Metabolic Panel Recent Labs    29/52/84 0522 03/04/23 0535  NA 135 136  K 3.6 3.6  CL 100 99  CO2 25 27  GLUCOSE 91 113*  BUN 35* 36*  CREATININE 1.75* 1.87*  CALCIUM 8.5* 8.7*  MG 2.0 2.1   Liver Function Tests No results for input(s): "AST", "ALT", "ALKPHOS", "BILITOT", "PROT", "ALBUMIN" in the last 72 hours.  No results for input(s): "LIPASE", "AMYLASE" in the last 72 hours. Cardiac Enzymes No results for input(s): "CKTOTAL", "CKMB", "CKMBINDEX", "TROPONINI" in the last 72 hours.  BNP: BNP (last 3 results) Recent Labs    02/19/23 1117 02/21/23 1519 02/23/23 2007  BNP 3,124.7* 2,950.2* 4,190.1*    ProBNP (last 3 results) Recent Labs    09/08/22 1308 01/18/23 1326 02/18/23 1503  PROBNP 5,154* 9,910* 24,266*     D-Dimer No results for input(s): "DDIMER" in the last 72 hours. Hemoglobin A1C No results for input(s): "HGBA1C" in the last 72 hours. Fasting Lipid Panel No results for input(s): "CHOL", "HDL", "LDLCALC", "TRIG", "CHOLHDL", "LDLDIRECT" in the last 72 hours. Thyroid Function Tests No results for input(s): "TSH", "T4TOTAL", "T3FREE", "THYROIDAB" in the last 72 hours.  Invalid input(s): "FREET3"   Other results:   Imaging    No  results found.   Medications:     Scheduled Medications:  amiodarone  400 mg Oral Daily   apixaban  2.5 mg Oral BID   atorvastatin  40 mg Oral Daily   brinzolamide  1 drop Both Eyes BID   budesonide  0.25 mg Nebulization BID   Chlorhexidine Gluconate Cloth  6 each Topical Daily   cyanocobalamin  1,000 mcg Oral Daily   dapagliflozin propanediol  10 mg Oral Daily   ferrous sulfate  325 mg Oral Daily   fluticasone  2 spray Each Nare Daily   ivabradine  5 mg Oral BID WC   latanoprost  1 drop Both Eyes QHS   levothyroxine  100 mcg Oral Daily   potassium chloride  60 mEq Oral BID   sodium chloride flush  3 mL Intravenous Q12H   torsemide  60 mg Oral Daily     Infusions:  milrinone 0.25 mcg/kg/min (03/03/23 0707)    PRN Medications: acetaminophen, albuterol, ALPRAZolam, ondansetron (ZOFRAN) IV, sodium chloride flush    Patient Profile   77 y.o. female with history of chronic systolic CHF/NICM, CAD, CKD IIIb, restrictive lung disease, OSA, breast cancer s/p lumpectomy/chemo (adriamycin)/XRT.  Admitted with acute on chronic systolic CHF with low-output.  Assessment/Plan   1. Acute on chronic systolic CHF: Primarily nonischemic cardiomyopathy.  Thought to be due to prior Adriamycin chemotherapy (chemo in 2000, cardiomyopathy diagnosed 2001).  She refused ICD in the past.  Cardiac MRI in 3/21 showed LV EF 20%, RV EF 34%, transmural LGE mid-apical anterior wall, possible prior infarction.  Echo in 10/24 showed EF 25-30%, dilated LV, low normal RV function, moderate MR.  RHC 02/23/23 with primarily left-sided failure with mean RA pressure 4, mean PCWP 20, CI 1.63, and preserved PAPi 2.5.  Echo this admit with LVEF 25-30% (looks 25-30% to me), RV okay, moderate to severe MR. NYHA class IV symptoms at home. Now on milrinone for low output, feels considerably better.  On milrinone 0.25, co-ox 50% early am.  Not ideal co-ox but will not increase milrinone with NSVT, will repeat co-ox now that she is up and about.  CVP 8-9 this morning, down 1 lb and now on torsemide.  Creatinine up incrementally to 1.87. GDMT limited by BP and renal function. - Continue milrinone 0.25, will plan for home infusion for palliation.    - Continue ivabradine 5 mg bid, HR better.  - Will send home on torsemide 40 mg daily with KCl 40 mEq daily.  She can increase torsemide to 60 mg daily if weight rises.  - Off Toprol XL with low output.  - Currently on 10 mg Farxiga daily, continue.  - Poor prognosis with low output HF.  Options are limited.  LVAD would be very high risk at her age and small size. She also states she would not want to pursue LVAD, she wishes to avoid invasive  procedures. We discussed Barostim and CCM devices. These devices have helped with symptoms in patients with moderate heart failure but have not been studied in patients with end stage heart failure who are inotrope dependent.  I do not think that she would get significant benefit from these devices and she would not qualify for barostimulator device regardless with high BNP.  I think that her best option is going to be palliative home milrinone.   - Palliative Care following for GOC discussions. Appreciate input. 2. Sinus tachycardia: Improved on ivabradine.  3. CAD: Cath in 10/24 in setting of NSTEMI showed 80%  OM3 stenosis, possibly a thrombotic lesion post-COVID-19 PNA.  No chest pain.   - Continue Eliquis 2.5 mg bid based on weight and creatinine > 1.5 (?thrombotic coronary lesion).  - Continue atorvastatin 40 daily.  4. CKD stage 3: Suspect cardiorenal syndrome in setting of low output HF.  Creatinine 1.65 => 1.59 => 1.75 => 1.87 today with inotropic support.    5. Mitral regurgitation: Echo this admit with moderate to severe MR.  6. 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  - Follow, slowly improving.  7. SVT/NSVT: Short runs of both SVT and NSVT.  She is now on amiodarone.  8. Disposition: She can go  home today.  Work in to see me in the [place] office next week, after that will see in [place].  Meds for home: Milrinone 0.25 infusion, torsemide 40 mg daily, KCl 40 mEq daily, Farxiga 10 mg daily, ivabradine 5 mg bid, amiodarone 400 mg daily x 1 week then 200 mg daily after that, atorvastatin 40, apixaban 2.5 bid.   Length of Stay: 9  Marca Ancona, MD  03/04/2023, 7:39 AM  Advanced Heart Failure Team Pager [phone] (M-F; 7a - 5p)  Please contact [facility] for night-coverage after hours (5p -7a ) and weekends on [email or web address]

## 2023-03-09 ENCOUNTER — Ambulatory Visit: Payer: Medicare HMO | Attending: Cardiology | Admitting: Cardiology

## 2023-03-09 VITALS — BP 101/70 | HR 92 | Wt 115.0 lb

## 2023-03-09 DIAGNOSIS — I5022 Chronic systolic (congestive) heart failure: Secondary | ICD-10-CM | POA: Diagnosis not present

## 2023-03-09 MED ORDER — TORSEMIDE 20 MG PO TABS: ORAL_TABLET | ORAL | 5 refills | Status: DC

## 2023-03-09 MED ORDER — POTASSIUM CHLORIDE CRYS ER 20 MEQ PO TBCR
EXTENDED_RELEASE_TABLET | ORAL | 0 refills | Status: DC
Start: 2023-03-09 — End: 2023-08-10

## 2023-03-09 NOTE — Progress Notes (Signed)
PCP: [name], MD Cardiology: Dr. Bing Matter  77 y.o. 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 Bing Matter.   After moving to [place], she was initially followed in [place] and at [place]. Cardiac MRI at [facility] on 08/12/16 showed EF 26% with dilated left ventricle 6.2 cm mild to moderate mitral regurgitation. There was subendocardial hyperenhancement involving the mid-distal anterior wall, consistent with subendocardial infarction in the distribution of a diagonal branch of the LAD. Adenosine stress perfusion imaging demonstrated no evidence of inducible myocardial ischemia. Hypotension limited GDMT and she refused ICD.   In 2019, she established care with Dr. Dulce Sellar. EF 20-25 at the time. cMRI in 3/21 showed severely decreased LV function and mid-moderately decreased RV function with RVEF 34% and transmural delayed myocardial enhancement in the mid-apical LV anterior wall suggestive of prior infarct.    Overall, patient was fairly well compensated from a heart failure standpoint until March 2024. She underwent knee replacement at that time and post procedure developed SOB, weakness, fatigue, hypotension.    Echo 7/24 showed EF 30-35 with grade 1 diastolic dysfunction, normal RV, moderate MR.  She then traveled to [place] and developed COVID pneumonia and admitted to the hospital for 5 day in 7/24.    She had an admission 10/10-10/15/24 for CHF exacerbation and NSTEMI, trop 11,645 and BNP 3090. Repeat Echo 01/23/23 with EF 25-30%, G3DD, elevated LAP, global HK, nl RVSF, nl PASP, mod MR. LHC/RHC showed mean RA 2, CI 2.86, PA 36/15, mean PWCP 13 with 80% stenosis in OM3 (?thrombotic in setting of prior COVID-19). Eliquis was started.  She was  readmitted later in 10/24 for further diuresis.    After discharge from her 2nd admission in 10/24, she went to the ER on 02/19/23 with weakness and was noted to have AKI and hyperkalemia as well as elevated lactate at 2.3.  She was sent home.  Torsemide was stopped due to "dehydration."  She went back to the ER 02/21/23 with shortness of breath.    I readmitted the patient on 02/23/23 with low output HF and failure to thrive.  RHC showed elevated PCWP and CI 1.63 by Fick.  I started her on milrinone 0.25 and diuresed her.  Echo this admission showed EF 20-25% by my read, normal RV size/systolic function, mod-severe MR.  She felt much better on milrinone and was able to walk up and down the halls. She was discharged on home milrinone 0.25 mcg/kg/min via PICC.   Patient returns for followup of CHF.  She is doing considerably better on home milrinone. She still tires easily, but now able to walk across the house without dyspnea.  No orthopnea/PND.  She has an occasional dry cough.  No lightheadedness.  Trying to walk more around the house. Getting home PT. Eating and sleeping better.  Weight down 4 lbs compared to pre-hospital.   Labs (11/24): K 4.3, creatinine 1.78 => 1.61 => 1.87, hgb 13.6, AST 81, ALT 125, BNP 2950, lactate 2.3  ECG (personally reviewed): NSR, inferior Qs, iRBBB, LAFB, PVCs.   PMH: 1. CKD stage 3 2. COVID-19 PNA in 7/24 3. Breast cancer: Treated in 2000 with lumpectomy, XRT, and chemotherapy involving Adriamycin.  4. Chronic systolic CHF: Primarily nonischemic cardiomyopathy.  Diagnosed in 2001, though to be due to chemotheraphy (  Adriamycin).   - Cardiac MRI (3/21): LV EF 20%, RV EF 34%, transmural LGE mid-apical anterior wall, possible prior infarction.  - Echo (9/22): EF 30-35%, normal RV - Echo (7/24): EF 30-35%, normal RV, moderate MR.  - Echo (10/24): EF 25-30%, dilated LV, low normal RV function, moderate MR.  - RHC (10/24): mean RA 2, PA 36/15, mean PCWP 10, CI 2.86 -  RHC (11/24): mean RA 4, PA 32/22 mean 29, mean PCWP 20, CI 1.63, PAPi 2.5 - Echo (11/24): EF 20-25% by my read, normal RV size/systolic function, mod-severe MR.  5. HTN 6. Hyperlipidemia 7. OSA 8. Hypothyroidism 9. Glaucoma 10. TKR 3/24 11. CAD: LHC in 10/24 with 80% OM3 stenosis, thought to be thrombotic.  12. PVCs/NSVT  Social History   Socioeconomic History   Marital status: Married    Spouse name: Not on file   Number of children: Not on file   Years of education: Not on file   Highest education level: Not on file  Occupational History   Not on file  Tobacco Use   Smoking status: Never    Passive exposure: Never   Smokeless tobacco: Never   Tobacco comments:    NEVER USED TOBACCO  Vaping Use   Vaping status: Never Used  Substance and Sexual Activity   Alcohol use: Yes    Alcohol/week: 0.0 standard drinks of alcohol    Comment: occ   Drug use: No   Sexual activity: Not on file  Other Topics Concern   Not on file  Social History Narrative   Not on file   Social Determinants of Health   Financial Resource Strain: Not on file  Food Insecurity: No Food Insecurity (02/23/2023)   Hunger Vital Sign    Worried About Running Out of Food in the Last Year: Never true    Ran Out of Food in the Last Year: Never true  Transportation Needs: No Transportation Needs (02/23/2023)   PRAPARE - Administrator, Civil Service (Medical): No    Lack of Transportation (Non-Medical): No  Physical Activity: Not on file  Stress: Not on file  Social Connections: Unknown (06/27/2022)   Received from [facility], [facility]   Social Network    Social Network: Not on file  Intimate Partner Violence: Not At Risk (02/23/2023)   Humiliation, Afraid, Rape, and Kick questionnaire    Fear of Current or Ex-Partner: No    Emotionally Abused: No    Physically Abused: No    Sexually Abused: No   Family History  Problem Relation Age of Onset   Hypertension Mother    Heart  attack Neg Hx    Stroke Neg Hx    Diabetes Neg Hx    ROS: All systems reviewed and negative except as per HPI.   Current Outpatient Medications  Medication Sig Dispense Refill   albuterol (PROVENTIL HFA;VENTOLIN HFA) 108 (90 Base) MCG/ACT inhaler Inhale 2 puffs into the lungs every 6 (six) hours as needed for wheezing or shortness of breath. 1 Inhaler 6   alendronate (FOSAMAX) 35 MG tablet Take 35 mg by mouth once a week.     ALPRAZolam (XANAX) 0.25 MG tablet Take 1 tablet (0.25 mg total) by mouth every 8 (eight) hours as needed for anxiety. 15 tablet 0   amiodarone (PACERONE) 200 MG tablet Take 400 mg (2 tablets) by mouth daily until 11/28, then only take 200 mg (1 tablet) daily. 60 tablet 5   apixaban (ELIQUIS) 2.5 MG TABS  tablet Take 1 tablet (2.5 mg total) by mouth 2 (two) times daily. 60 tablet 5   ARNUITY ELLIPTA 100 MCG/ACT AEPB Inhale 1 puff into the lungs daily.     atorvastatin (LIPITOR) 40 MG tablet Take 1 tablet (40 mg total) by mouth daily.     Biotin 5000 MCG TABS Take 1 tablet by mouth daily.     brinzolamide (AZOPT) 1 % ophthalmic suspension Place 1 drop into both eyes 2 (two) times daily.     Calcium Carbonate-Vitamin D (CALCIUM + D PO) Take 1 tablet by mouth 2 (two) times daily.      Cholecalciferol 25 MCG (1000 UT) tablet Take 1,000 Units by mouth daily.      dapagliflozin propanediol (FARXIGA) 10 MG TABS tablet Take 1 tablet (10 mg total) by mouth daily. 30 tablet 5   ferrous sulfate 325 (65 FE) MG tablet Take 325 mg by mouth daily.     fluticasone (FLONASE) 50 MCG/ACT nasal spray Place 2 sprays into both nostrils daily. 16 g 2   Hypromellose (GENTEAL OP) Place 1 drop into both eyes in the morning, at noon, in the evening, and at bedtime.     ivabradine (CORLANOR) 5 MG TABS tablet Take 1 tablet (5 mg total) by mouth 2 (two) times daily with a meal. 60 tablet 5   latanoprost (XALATAN) 0.005 % ophthalmic solution Place 1 drop into both eyes at bedtime.     levothyroxine  (SYNTHROID, LEVOTHROID) 100 MCG tablet Take 100 mcg by mouth daily.     Loratadine 10 MG CAPS Take 10 mg by mouth daily.     milrinone (PRIMACOR) 20 MG/100 ML SOLN infusion Inject 0.0129 mg/min into the vein continuous.     Multiple Vitamins-Minerals (CENTRUM SILVER PO) Take 1 tablet by mouth every morning.     nitroGLYCERIN (NITROSTAT) 0.4 MG SL tablet Place 1 tablet (0.4 mg total) under the tongue every 5 (five) minutes as needed for chest pain (up to 3 doses). 25 tablet 3   Travoprost, BAK Free, (TRAVATAN) 0.004 % SOLN ophthalmic solution Place 1 drop into both eyes at bedtime.     vitamin B-12 (CYANOCOBALAMIN) 1000 MCG tablet Take 1,000 mcg by mouth daily.     vitamin C (ASCORBIC ACID) 500 MG tablet Take 500 mg by mouth daily.     potassium chloride SA (KLOR-CON M) 20 MEQ tablet Take 2 tablets (40 mEq total) by mouth in the morning AND 1 tablet (20 mEq total) every evening. 720 tablet 0   torsemide (DEMADEX) 20 MG tablet Take 2 tablets (40 mg total) by mouth every morning AND 1 tablet (20 mg total) every evening. 180 tablet 5   No current facility-administered medications for this visit.   BP 101/70   Pulse 92   Wt 115 lb (52.2 kg)   SpO2 100%   BMI 23.23 kg/m  General: NAD Neck: JVP 8-9, no thyromegaly or thyroid nodule.  Lungs: Clear to auscultation bilaterally with normal respiratory effort. CV: Nondisplaced PMI.  Heart regular S1/S2, no S3/S4, no murmur.  Trace ankle edema.  No carotid bruit.  Normal pedal pulses.  Abdomen: Soft, nontender, no hepatosplenomegaly, no distention.  Skin: Intact without lesions or rashes.  Neurologic: Alert and oriented x 3.  Psych: Normal affect. Extremities: No clubbing or cyanosis.  HEENT: Normal.   Assessment/Plan:  1. Chronic systolic CHF: Primarily nonischemic cardiomyopathy.  Thought to be due to prior Adriamycin chemotherapy (chemo in 2000, cardiomyopathy diagnosed 2001).  She refused ICD in  the past.  Cardiac MRI in 3/21 showed LV EF 20%,  RV EF 34%, transmural LGE mid-apical anterior wall, possible prior infarction.  Echo in 10/24 showed EF 25-30%, dilated LV, low normal RV function, moderate MR.  RHC 02/23/23 with primarily left-sided failure with mean RA pressure 4, mean PCWP 20, CI 1.63, and preserved PAPi 2.5.  Echo 11/24 with LVEF 20-25% by my read, RV okay, moderate to severe MR. NYHA class IV symptoms prior to starting milrinone. Now on milrinone 0.25 for low output, feels considerably better.  Last creatinine 1.87.  She is mildly volume overloaded on exam, feels better with milrinone, NYHA class III.  - Continue milrinone 0.25 mcg/kg/min as home infusion for palliation.    - Continue ivabradine 5 mg bid, HR better.  - Increase torsemide to 40 qam/20 qpm and KCL to 40 qam/20 qpm. BMET/BNP today, BMET in 10 days.   - Off Toprol XL with low output.  - Currently on 10 mg Farxiga daily, continue.  - Poor prognosis with low output HF.  Options are limited.  LVAD would be very high risk at her age and small size and with renal dysfunction. She has also told me that she would not want to pursue LVAD, she wishes to avoid major surgery. We have discussed Barostimulator and CCM devices. These devices have helped with symptoms in patients with moderate heart failure but have not been studied in patients with end stage heart failure who are inotrope dependent.  I do not think that she would get significant benefit from these devices and she would not qualify for barostimulator device regardless with high BNP, using these would be off label.  I think that her best option for now is going to be palliative home milrinone.  If she continues working with PT and gets more mobile, I told her that we could revisit barostimulation if BNP comes down and also can discuss LVAD again.  2. Sinus tachycardia: Improved on ivabradine.  3. CAD: Cath in 10/24 in setting of NSTEMI showed 80% OM3 stenosis, possibly a thrombotic lesion post-COVID-19 PNA.  No chest  pain.   - Continue Eliquis 2.5 mg bid based on weight and creatinine > 1.5 (?thrombotic coronary lesion).  - Continue atorvastatin 40 daily.  4. CKD stage 3: Suspect cardiorenal syndrome in setting of low output HF.   - BMET today.   5. Mitral regurgitation: Echo 11/24 with moderate to severe MR.  6. 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  - Check LFTs today.  7. SVT/NSVT: Short runs of both SVT and NSVT while on milrinone.  She is now on amiodarone to suppress. - Continue amiodarone 400 daily to 11/28 then 200 mg daily after that.  Check LFTs and TSH today.   Followup with me in 3 wks in [place] office.   Marca Ancona 03/09/2023 1:41 PM

## 2023-03-09 NOTE — Patient Instructions (Addendum)
CHANGE Torsemide to 40 mg in the morning and 20 mg in the evening.  CHANGE Potassium to 40 mEq in the morning and 20 mEq in the evening.  Go DOWN to LOWER LEVEL (LL) to have your blood work completed inside of [employer] office.  Repeat blood work in 10 days.  We will ask your home health nurse to draw these labs.  We will only call you if the results are abnormal or if the provider would like to make medication changes.  Your physician recommends that you schedule a follow-up appointment in: 3 weeks at the [place] office.

## 2023-03-10 ENCOUNTER — Telehealth (HOSPITAL_COMMUNITY): Payer: Self-pay

## 2023-03-10 LAB — CBC
Hematocrit: 42.1 % (ref 34.0–46.6)
Hemoglobin: 13.1 g/dL (ref 11.1–15.9)
MCH: 26.2 pg — ABNORMAL LOW (ref 26.6–33.0)
MCHC: 31.1 g/dL — ABNORMAL LOW (ref 31.5–35.7)
MCV: 84 fL (ref 79–97)
Platelets: 302 10*3/uL (ref 150–450)
RBC: 5 x10E6/uL (ref 3.77–5.28)
RDW: 15.8 % — ABNORMAL HIGH (ref 11.7–15.4)
WBC: 6.7 10*3/uL (ref 3.4–10.8)

## 2023-03-10 LAB — COMPREHENSIVE METABOLIC PANEL
ALT: 40 [IU]/L — ABNORMAL HIGH (ref 0–32)
AST: 36 [IU]/L (ref 0–40)
Albumin: 4.3 g/dL (ref 3.8–4.8)
Alkaline Phosphatase: 121 [IU]/L (ref 44–121)
BUN/Creatinine Ratio: 27 (ref 12–28)
BUN: 57 mg/dL — ABNORMAL HIGH (ref 8–27)
Bilirubin Total: 0.9 mg/dL (ref 0.0–1.2)
CO2: 24 mmol/L (ref 20–29)
Calcium: 9.9 mg/dL (ref 8.7–10.3)
Chloride: 91 mmol/L — ABNORMAL LOW (ref 96–106)
Creatinine, Ser: 2.14 mg/dL — ABNORMAL HIGH (ref 0.57–1.00)
Globulin, Total: 3.1 g/dL (ref 1.5–4.5)
Glucose: 101 mg/dL — ABNORMAL HIGH (ref 70–99)
Potassium: 4.7 mmol/L (ref 3.5–5.2)
Sodium: 134 mmol/L (ref 134–144)
Total Protein: 7.4 g/dL (ref 6.0–8.5)
eGFR: 23 mL/min/{1.73_m2} — ABNORMAL LOW (ref >59)

## 2023-03-10 LAB — PRO B NATRIURETIC PEPTIDE: NT-Pro BNP: 16793 pg/mL — ABNORMAL HIGH (ref 0–738)

## 2023-03-10 LAB — TSH: TSH: 4.14 u[IU]/mL (ref 0.450–4.500)

## 2023-03-10 MED ORDER — TORSEMIDE 20 MG PO TABS: 20.0000 mg | ORAL_TABLET | Freq: Two times a day (BID) | ORAL | Status: DC

## 2023-03-10 NOTE — Telephone Encounter (Signed)
Spoke with patient regarding the following results. Patient made aware and patient verbalized understanding.   Medication list updated.

## 2023-03-10 NOTE — Telephone Encounter (Signed)
-----   Message from Marca Ancona sent at 03/10/2023 12:23 PM EST ----- Creatinine is mildly higher.  Let's keep torsemide dose at 20 mg bid rather than 40 qam/20 qpm.

## 2023-03-10 NOTE — Telephone Encounter (Signed)
BMET orders faxed to [facility] on [phone] on 03/10/23

## 2023-03-14 ENCOUNTER — Encounter (HOSPITAL_COMMUNITY): Payer: Self-pay | Admitting: Cardiology

## 2023-03-15 ENCOUNTER — Telehealth: Payer: Self-pay

## 2023-03-15 ENCOUNTER — Encounter (HOSPITAL_COMMUNITY): Payer: Self-pay | Admitting: Cardiology

## 2023-03-15 NOTE — Telephone Encounter (Signed)
Dr Shirlee Latch would not be the referring provider for this referral.   Process initiated with Dr Kirtland Bouchard . Of note records sent x 2 on 11/20

## 2023-03-15 NOTE — Telephone Encounter (Signed)
Sent records once again to another fax number provided by pt. [phone]

## 2023-03-17 ENCOUNTER — Encounter (HOSPITAL_COMMUNITY): Payer: Self-pay | Admitting: Cardiology

## 2023-03-18 ENCOUNTER — Ambulatory Visit: Payer: Medicare HMO | Admitting: Cardiology

## 2023-03-19 ENCOUNTER — Encounter (HOSPITAL_COMMUNITY): Payer: Self-pay | Admitting: Cardiology

## 2023-03-19 DIAGNOSIS — J984 Other disorders of lung: Secondary | ICD-10-CM | POA: Insufficient documentation

## 2023-03-19 DIAGNOSIS — J9611 Chronic respiratory failure with hypoxia: Secondary | ICD-10-CM | POA: Insufficient documentation

## 2023-03-23 ENCOUNTER — Other Ambulatory Visit (HOSPITAL_COMMUNITY): Payer: Self-pay

## 2023-03-25 DIAGNOSIS — Z95811 Presence of heart assist device: Secondary | ICD-10-CM

## 2023-03-25 DIAGNOSIS — I255 Ischemic cardiomyopathy: Secondary | ICD-10-CM | POA: Insufficient documentation

## 2023-03-25 HISTORY — DX: Presence of heart assist device: Z95.811

## 2023-03-26 DIAGNOSIS — D509 Iron deficiency anemia, unspecified: Secondary | ICD-10-CM | POA: Insufficient documentation

## 2023-03-26 DIAGNOSIS — Z95811 Presence of heart assist device: Secondary | ICD-10-CM | POA: Insufficient documentation

## 2023-03-27 DIAGNOSIS — D72829 Elevated white blood cell count, unspecified: Secondary | ICD-10-CM | POA: Insufficient documentation

## 2023-03-28 DIAGNOSIS — R4589 Other symptoms and signs involving emotional state: Secondary | ICD-10-CM | POA: Insufficient documentation

## 2023-03-30 ENCOUNTER — Encounter (HOSPITAL_COMMUNITY): Payer: Medicare HMO | Admitting: Cardiology

## 2023-04-01 ENCOUNTER — Other Ambulatory Visit (HOSPITAL_COMMUNITY): Payer: Self-pay | Admitting: Cardiology

## 2023-04-05 DIAGNOSIS — I4891 Unspecified atrial fibrillation: Secondary | ICD-10-CM | POA: Insufficient documentation

## 2023-04-05 DIAGNOSIS — Z7901 Long term (current) use of anticoagulants: Secondary | ICD-10-CM | POA: Insufficient documentation

## 2023-04-12 DIAGNOSIS — I272 Pulmonary hypertension, unspecified: Secondary | ICD-10-CM | POA: Insufficient documentation

## 2023-04-20 DIAGNOSIS — Z7901 Long term (current) use of anticoagulants: Secondary | ICD-10-CM | POA: Insufficient documentation

## 2023-04-28 DIAGNOSIS — R509 Fever, unspecified: Secondary | ICD-10-CM | POA: Insufficient documentation

## 2023-05-24 DIAGNOSIS — I2583 Coronary atherosclerosis due to lipid rich plaque: Secondary | ICD-10-CM | POA: Insufficient documentation

## 2023-05-25 ENCOUNTER — Ambulatory Visit: Payer: Medicare HMO | Admitting: Cardiology

## 2023-05-26 ENCOUNTER — Other Ambulatory Visit: Payer: Self-pay | Admitting: Cardiology

## 2023-07-07 ENCOUNTER — Other Ambulatory Visit (HOSPITAL_COMMUNITY): Payer: Self-pay

## 2023-07-15 ENCOUNTER — Telehealth (HOSPITAL_COMMUNITY): Payer: Self-pay

## 2023-07-15 NOTE — Telephone Encounter (Signed)
 Called patient to see if she was interested in participating in the Cardiac Rehab Program. Patient will come in for orientation on 07/27/23 @ 1030AM and will attend the 1145AM (M/F) exercise class. Went over insurance, patient verbalized understanding.   Pensions consultant.

## 2023-07-15 NOTE — Telephone Encounter (Signed)
 Pt insurance is active and benefits verified through [facility]. Co-pay $25.00, DED $0.00/$0.00 met, out of pocket $6,750.00/$2,875.86 met, co-insurance 0%. No pre-authorization required. Passport, 07/15/23 @ 2:56PM, REF#[id number]   How many CR sessions are covered? (36 visits for TCR, 72 visits for ICR)72 Is this a lifetime maximum or an annual maximum? Annual Has the member used any of these services to date? No Is there a time limit (weeks/months) on start of program and/or program completion? No

## 2023-07-20 ENCOUNTER — Telehealth (HOSPITAL_COMMUNITY): Payer: Self-pay

## 2023-07-20 NOTE — Telephone Encounter (Signed)
 Called pt to see if she could switch her cardiac rehab orientation time from 1030 to 115 due to staffing issues. Pt was able to switch.

## 2023-07-27 ENCOUNTER — Telehealth (HOSPITAL_COMMUNITY): Payer: Self-pay

## 2023-07-27 ENCOUNTER — Inpatient Hospital Stay (HOSPITAL_COMMUNITY): Admission: RE | Admit: 2023-07-27 | Source: Ambulatory Visit

## 2023-07-30 ENCOUNTER — Telehealth (HOSPITAL_COMMUNITY): Payer: Self-pay

## 2023-07-30 NOTE — Telephone Encounter (Signed)
 Rescheduled patient for CR Orientation, will attend 4/29 @ 10:30am, class time will be 12:30pm on M/F.

## 2023-08-02 ENCOUNTER — Ambulatory Visit (HOSPITAL_COMMUNITY)

## 2023-08-06 ENCOUNTER — Ambulatory Visit (HOSPITAL_COMMUNITY)

## 2023-08-09 ENCOUNTER — Ambulatory Visit (HOSPITAL_COMMUNITY)

## 2023-08-10 ENCOUNTER — Encounter (HOSPITAL_COMMUNITY)
Admission: RE | Admit: 2023-08-10 | Discharge: 2023-08-10 | Disposition: A | Discharge status: Home / Self Care | Source: Ambulatory Visit | Attending: Cardiology | Admitting: Cardiology

## 2023-08-10 ENCOUNTER — Encounter (HOSPITAL_COMMUNITY): Payer: Self-pay

## 2023-08-10 VITALS — BP 82/0 | HR 89 | Ht 59.0 in | Wt 127.0 lb

## 2023-08-10 DIAGNOSIS — Z95811 Presence of heart assist device: Secondary | ICD-10-CM | POA: Diagnosis present

## 2023-08-10 DIAGNOSIS — I5022 Chronic systolic (congestive) heart failure: Secondary | ICD-10-CM | POA: Diagnosis present

## 2023-08-10 NOTE — Progress Notes (Signed)
 Cardiac Individual Treatment Plan  Patient Details  Name: Nichole Johnson MRN: [id number] Date of Birth: 1946/03/31 Referring Provider:   Flowsheet Row INTENSIVE CARDIAC REHAB ORIENT from 08/10/2023 in [facility]  Referring Provider Dr. Mentz(Traci Turner covering)       Initial Encounter Date:  Flowsheet Row INTENSIVE CARDIAC REHAB ORIENT from 08/10/2023 in [facility]  Date 08/10/23       Visit Diagnosis: 03/25/23 S/P LVAD Implantation at [facility]  Heart failure, chronic systolic (HCC)  Patient's Home Medications on Admission:  Current Outpatient Medications:    acetaminophen  (TYLENOL ) 325 MG tablet, Take 325 mg by mouth every 4 (four) hours as needed for moderate pain (pain score 4-6). 2 tablets as needed, Disp: , Rfl:    atorvastatin  (LIPITOR) 40 MG tablet, Take 1 tablet (40 mg total) by mouth daily., Disp: , Rfl:    brinzolamide  (AZOPT ) 1 % ophthalmic suspension, Place 1 drop into both eyes 2 (two) times daily., Disp: , Rfl:    budesonide -formoterol (SYMBICORT) 80-4.5 MCG/ACT inhaler, Inhale 2 puffs into the lungs at bedtime., Disp: , Rfl:    Cholecalciferol 25 MCG (1000 UT) tablet, Take 1,000 Units by mouth daily. , Disp: , Rfl:    fluticasone  (FLONASE ) 50 MCG/ACT nasal spray, Place 2 sprays into both nostrils daily., Disp: 16 g, Rfl: 2   Hypromellose (GENTEAL OP), Place 1 drop into both eyes in the morning, at noon, in the evening, and at bedtime., Disp: , Rfl:    latanoprost  (XALATAN ) 0.005 % ophthalmic solution, Place 1 drop into both eyes at bedtime., Disp: , Rfl:    levothyroxine  (SYNTHROID ) 125 MCG tablet, Take 125 mcg by mouth daily before breakfast., Disp: , Rfl:    Loratadine 10 MG CAPS, Take 10 mg by mouth daily., Disp: , Rfl:    nitroGLYCERIN  (NITROSTAT ) 0.4 MG SL tablet, Place 1 tablet (0.4 mg total) under the tongue every 5 (five) minutes as needed for  chest pain (up to 3 doses)., Disp: 25 tablet, Rfl: 3   pantoprazole (PROTONIX) 40 MG tablet, Take 40 mg by mouth 2 (two) times daily., Disp: , Rfl:    sildenafil (REVATIO) 20 MG tablet, Take 10 mg by mouth 3 (three) times daily., Disp: , Rfl:    vitamin C  (ASCORBIC ACID ) 500 MG tablet, Take 500 mg by mouth daily., Disp: , Rfl:    warfarin (COUMADIN) 1 MG tablet, Take 1 mg by mouth daily at 4 PM. Take as directed, Disp: , Rfl:    albuterol  (PROVENTIL  HFA;VENTOLIN  HFA) 108 (90 Base) MCG/ACT inhaler, Inhale 2 puffs into the lungs every 6 (six) hours as needed for wheezing or shortness of breath. (Patient not taking: Reported on 08/10/2023), Disp: 1 Inhaler, Rfl: 6   alendronate (FOSAMAX) 35 MG tablet, Take 35 mg by mouth once a week. 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   Dilated cardiomyopathy (HCC) 04/10/2018   Dyspepsia 01/18/2017  Dysplastic nevus 12/02/2016   Glaucoma 10/14/2017   Hiatal hernia 07/20/2019   High cholesterol    History of breast cancer 04/23/2020   Hyperlipidemia 04/18/2014   Hypertension    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 Pt was given copy of recent ECHO report.  Last Assessment & Plan:  Pt will f/u w/ Cardiology re this. Pt was given copy of recent ECHO report.    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 Added automatically from request for surgery [id number] Formatting of this note might be different from the original. Added automatically from request for surgery [id number]   Osteopenia of hip 04/21/2020   Formatting of this note might be different from the original. DEXA 06/2017: Lumbar T score -1.4, right total femur -1.4. She restarted alendronate since 05/2019. Formatting of this note might be different from the original. DEXA 06/2017: Lumbar T score -1.4, right total femur -1.4. She restarted alendronate since 05/2019.   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 Start using Flonase  daily. Stop using OTC Afrin Formatting of this note might be different from the original. Last Assessment & Plan: Start using Flonase  daily. Stop using OTC Afrin Formatting of this note might be different from the original. Overview: Last Assessment & Plan: Start using Flonase  daily. Stop usin   Stage 3a chronic kidney disease (HCC) 04/01/2021   Trigger finger of left hand 07/26/2018    Tobacco Use: Social History   Tobacco Use  Smoking Status Never   Passive exposure: Never  Smokeless Tobacco Never  Tobacco Comments   NEVER USED TOBACCO    Labs: Review Flowsheet  More data exists      Latest Ref Rng & Units 02/28/2023 03/01/2023 03/02/2023 03/03/2023 03/04/2023  Labs for ITP  Cardiac and Pulmonary Rehab  O2 Saturation % 41.7  53.8  54.9  57.4  54.1  49.7  49.6     Details       Multiple values from one day are sorted in reverse-chronological order         Capillary Blood Glucose: No results found for: "GLUCAP"   Exercise Target Goals: Exercise Program Goal: Individual exercise prescription set using results from initial 6 min walk test and THRR while considering  patient's activity barriers and safety.   Exercise Prescription Goal: Initial exercise prescription builds to 30-45 minutes a day of aerobic activity, 2-3 days per week.  Home exercise guidelines will be given to patient during program as part of exercise prescription that the participant will acknowledge.  Activity Barriers & Risk Stratification:  Activity Barriers & Cardiac Risk Stratification - 08/10/23 1357       Activity Barriers & Cardiac Risk Stratification   Activity Barriers Balance Concerns;Arthritis;Joint Problems;Assistive Device;Neck/Spine Problems;Other (  comment);Left Knee Replacement;Right Knee Replacement;Deconditioning    Comments Battery pack for LVAD    Cardiac Risk Stratification High             6 Minute Walk:  6 Minute Walk     Row Name 08/10/23 1356         6 Minute Walk   Phase Initial     Distance 1165 feet     Walk Time 6 minutes     # of Rest Breaks 0     MPH 2.21     METS 2     RPE 11     Perceived Dyspnea  1     VO2 Peak 7     Symptoms No     Resting HR 89 bpm     Resting BP 82/0     Resting Oxygen Saturation  98 %     Exercise Oxygen Saturation  during 6 min walk 98 %     Max Ex. HR 110 bpm     Max Ex. BP 106/0     2 Minute Post BP 102/0              Oxygen Initial Assessment:   Oxygen Re-Evaluation:   Oxygen Discharge (Final Oxygen Re-Evaluation):   Initial Exercise Prescription:  Initial Exercise Prescription - 08/10/23 1400       Date of Initial Exercise RX and Referring Provider   Date 08/10/23    Referring  Provider Dr. Mentz(Traci Turner covering)    Expected Discharge Date 11/03/23      Recumbant Bike   Level 1    RPM 60    Watts 25    Minutes 15    METs 2      NuStep   Level 1    SPM 75    Minutes 15    METs 2      Prescription Details   Frequency (times per week) 2    Duration Progress to 30 minutes of continuous aerobic without signs/symptoms of physical distress      Intensity   THRR 40-80% of Max Heartrate 56-113    Ratings of Perceived Exertion 11-13    Perceived Dyspnea 0-4      Progression   Progression Continue progressive overload as per policy without signs/symptoms or physical distress.      Resistance Training   Training Prescription Yes    Weight 2 lbs    Reps 10-15             Perform Capillary Blood Glucose checks as needed.  Exercise Prescription Changes:   Exercise Comments:   Exercise Goals and Review:   Exercise Goals     Row Name 08/10/23 1358             Exercise Goals   Increase Physical Activity Yes       Intervention Provide advice, education, support and counseling about physical activity/exercise needs.;Develop an individualized exercise prescription for aerobic and resistive training based on initial evaluation findings, risk stratification, comorbidities and participant's personal goals.       Expected Outcomes Short Term: Attend rehab on a regular basis to increase amount of physical activity.;Long Term: Exercising regularly at least 3-5 days a week.;Long Term: Add in home exercise to make exercise part of routine and to increase amount of physical activity.       Increase Strength and Stamina Yes       Intervention Provide advice, education, support and counseling about physical activity/exercise  needs.;Develop an individualized exercise prescription for aerobic and resistive training based on initial evaluation findings, risk stratification, comorbidities and participant's personal goals.       Expected Outcomes Short Term:  Increase workloads from initial exercise prescription for resistance, speed, and METs.;Short Term: Perform resistance training exercises routinely during rehab and add in resistance training at home;Long Term: Improve cardiorespiratory fitness, muscular endurance and strength as measured by increased METs and functional capacity ( )       Able to understand and use rate of perceived exertion (RPE) scale Yes       Intervention Provide education and explanation on how to use RPE scale       Expected Outcomes Short Term: Able to use RPE daily in rehab to express subjective intensity level;Long Term:  Able to use RPE to guide intensity level when exercising independently       Knowledge and understanding of Target Heart Rate Range (THRR) Yes       Intervention Provide education and explanation of THRR including how the numbers were predicted and where they are located for reference       Expected Outcomes Short Term: Able to state/look up THRR;Long Term: Able to use THRR to govern intensity when exercising independently;Short Term: Able to use daily as guideline for intensity in rehab       Understanding of Exercise Prescription Yes       Intervention Provide education, explanation, and written materials on patient's individual exercise prescription       Expected Outcomes Short Term: Able to explain program exercise prescription;Long Term: Able to explain home exercise prescription to exercise independently                Exercise Goals Re-Evaluation :   Discharge Exercise Prescription (Final Exercise Prescription Changes):   Nutrition:  Target Goals: Understanding of nutrition guidelines, daily intake of sodium 1500mg , cholesterol 200mg , calories 30% from fat and 7% or less from saturated fats, daily to have 5 or more servings of fruits and vegetables.  Biometrics:  Pre Biometrics - 08/10/23 1359       Pre Biometrics   Waist Circumference 35.5 inches    Hip Circumference 36.5  inches    Waist to Hip Ratio 0.97 %    Triceps Skinfold 17 mm    % Body Fat 37.1 %    Grip Strength 12 kg    Flexibility --   Not performed   Single Leg Stand 7 seconds              Nutrition Therapy Plan and Nutrition Goals:   Nutrition Assessments:  MEDIFICTS Score Key: >=70 Need to make dietary changes  40-70 Heart Healthy Diet <= 40 Therapeutic Level Cholesterol Diet    Picture Your Plate Scores: <65 Unhealthy dietary pattern with much room for improvement. 41-50 Dietary pattern unlikely to meet recommendations for good health and room for improvement. 51-60 More healthful dietary pattern, with some room for improvement.  >60 Healthy dietary pattern, although there may be some specific behaviors that could be improved.    Nutrition Goals Re-Evaluation:   Nutrition Goals Re-Evaluation:   Nutrition Goals Discharge (Final Nutrition Goals Re-Evaluation):   Psychosocial: Target Goals: Acknowledge presence or absence of significant depression and/or stress, maximize coping skills, provide positive support system. Participant is able to verbalize types and ability to use techniques and skills needed for reducing stress and depression.  Initial Review & Psychosocial Screening:  Initial Psych Review & Screening - 08/10/23 1333  Initial Review   Current issues with History of Depression;Current Anxiety/Panic;Current Depression;Current Stress Concerns    Source of Stress Concerns Chronic Illness;Unable to perform yard/household activities;Unable to participate in former interests or hobbies    Comments Cherece admits to having depression due to her heart failure diagnosis and recent LVAD placement. Kandra said that she was taking an antidepressant while in the hospital. Keysha is not taking an antidepressant currently. Hayat denies the need for counselling at this time, Tiasha said that she will let us  know if she changes her mind      Family Dynamics   Good Support System? Yes    Kadian has her husband and 2 sons for support     Barriers   Psychosocial barriers to participate in program The patient should benefit from training in stress management and relaxation.      Screening Interventions   Interventions Encouraged to exercise    Expected Outcomes Long Term Goal: Stressors or current issues are controlled or eliminated.;Short Term goal: Identification and review with participant of any Quality of Life or Depression concerns found by scoring the questionnaire.;Long Term goal: The participant improves quality of Life and PHQ9 Scores as seen by post scores and/or verbalization of changes             Quality of Life Scores:  Quality of Life - 08/10/23 1353       Quality of Life   Select Quality of Life      Quality of Life Scores   Health/Function Pre 17.35 %    Socioeconomic Pre 22 %    Psych/Spiritual Pre 18.93 %    Family Pre 30 %    GLOBAL Pre 20.65 %            Scores of 19 and below usually indicate a poorer quality of life in these areas.  A difference of  2-3 points is a clinically meaningful difference.  A difference of 2-3 points in the total score of the Quality of Life Index has been associated with significant improvement in overall quality of life, self-image, physical symptoms, and general health in studies assessing change in quality of life.  PHQ-9: Review Flowsheet       08/10/2023  Depression screen PHQ 2/9  Decreased Interest 1  Down, Depressed, Hopeless 1  PHQ - 2 Score 2  Altered sleeping 0  Tired, decreased energy 2  Change in appetite 1  Feeling bad or failure about yourself  1  Trouble concentrating 1  Moving slowly or fidgety/restless 1  Suicidal thoughts 0  PHQ-9 Score 8  Difficult doing work/chores Somewhat difficult   Interpretation of Total Score  Total Score Depression Severity:  1-4 = Minimal depression, 5-9 = Mild depression, 10-14 = Moderate depression, 15-19 = Moderately severe depression, 20-27 = Severe  depression   Psychosocial Evaluation and Intervention:   Psychosocial Re-Evaluation:   Psychosocial Discharge (Final Psychosocial Re-Evaluation):   Vocational Rehabilitation: Provide vocational rehab assistance to qualifying candidates.   Vocational Rehab Evaluation & Intervention:  Vocational Rehab - 08/10/23 1354       Initial Vocational Rehab Evaluation & Intervention   Assessment shows need for Vocational Rehabilitation No             Education: Education Goals: Education classes will be provided on a weekly basis, covering required topics. Participant will state understanding/return demonstration of topics presented.     Core Videos: Exercise    Move It!  Clinical staff conducted group or individual  video education with verbal and written material and guidebook.  Patient learns the recommended Pritikin exercise program. Exercise with the goal of living a long, healthy life. Some of the health benefits of exercise include controlled diabetes, healthier blood pressure levels, improved cholesterol levels, improved heart and lung capacity, improved sleep, and better body composition. Everyone should speak with their doctor before starting or changing an exercise routine.  Biomechanical Limitations Clinical staff conducted group or individual video education with verbal and written material and guidebook.  Patient learns how biomechanical limitations can impact exercise and how we can mitigate and possibly overcome limitations to have an impactful and balanced exercise routine.  Body Composition Clinical staff conducted group or individual video education with verbal and written material and guidebook.  Patient learns that body composition (ratio of muscle mass to fat mass) is a key component to assessing overall fitness, rather than body weight alone. Increased fat mass, especially visceral belly fat, can put us  at increased risk for metabolic syndrome, type 2 diabetes,  heart disease, and even death. It is recommended to combine diet and exercise (cardiovascular and resistance training) to improve your body composition. Seek guidance from your physician and exercise physiologist before implementing an exercise routine.  Exercise Action Plan Clinical staff conducted group or individual video education with verbal and written material and guidebook.  Patient learns the recommended strategies to achieve and enjoy long-term exercise adherence, including variety, self-motivation, self-efficacy, and positive decision making. Benefits of exercise include fitness, good health, weight management, more energy, better sleep, less stress, and overall well-being.  Medical   Heart Disease Risk Reduction Clinical staff conducted group or individual video education with verbal and written material and guidebook.  Patient learns our heart is our most vital organ as it circulates oxygen, nutrients, white blood cells, and hormones throughout the entire body, and carries waste away. Data supports a plant-based eating plan like the Pritikin Program for its effectiveness in slowing progression of and reversing heart disease. The video provides a number of recommendations to address heart disease.   Metabolic Syndrome and Belly Fat  Clinical staff conducted group or individual video education with verbal and written material and guidebook.  Patient learns what metabolic syndrome is, how it leads to heart disease, and how one can reverse it and keep it from coming back. You have metabolic syndrome if you have 3 of the following 5 criteria: abdominal obesity, high blood pressure, high triglycerides, low HDL cholesterol, and high blood sugar.  Hypertension and Heart Disease Clinical staff conducted group or individual video education with verbal and written material and guidebook.  Patient learns that high blood pressure, or hypertension, is very common in the [place] . Hypertension is  largely due to excessive salt intake, but other important risk factors include being overweight, physical inactivity, drinking too much alcohol, smoking, and not eating enough potassium from fruits and vegetables. High blood pressure is a leading risk factor for heart attack, stroke, congestive heart failure, dementia, kidney failure, and premature death. Long-term effects of excessive salt intake include stiffening of the arteries and thickening of heart muscle and organ damage. Recommendations include ways to reduce hypertension and the risk of heart disease.  Diseases of Our Time - Focusing on Diabetes Clinical staff conducted group or individual video education with verbal and written material and guidebook.  Patient learns why the best way to stop diseases of our time is prevention, through food and other lifestyle changes. Medicine (such as prescription pills and surgeries)  is often only a Band-Aid on the problem, not a long-term solution. Most common diseases of our time include obesity, type 2 diabetes, hypertension, heart disease, and cancer. The Pritikin Program is recommended and has been proven to help reduce, reverse, and/or prevent the damaging effects of metabolic syndrome.  Nutrition   Overview of the Pritikin Eating Plan  Clinical staff conducted group or individual video education with verbal and written material and guidebook.  Patient learns about the Pritikin Eating Plan for disease risk reduction. The Pritikin Eating Plan emphasizes a wide variety of unrefined, minimally-processed carbohydrates, like fruits, vegetables, whole grains, and legumes. Go, Caution, and Stop food choices are explained. Plant-based and lean animal proteins are emphasized. Rationale provided for low sodium intake for blood pressure control, low added sugars for blood sugar stabilization, and low added fats and oils for coronary artery disease risk reduction and weight management.  Calorie Density  Clinical  staff conducted group or individual video education with verbal and written material and guidebook.  Patient learns about calorie density and how it impacts the Pritikin Eating Plan. Knowing the characteristics of the food you choose will help you decide whether those foods will lead to weight gain or weight loss, and whether you want to consume more or less of them. Weight loss is usually a side effect of the Pritikin Eating Plan because of its focus on low calorie-dense foods.  Label Reading  Clinical staff conducted group or individual video education with verbal and written material and guidebook.  Patient learns about the Pritikin recommended label reading guidelines and corresponding recommendations regarding calorie density, added sugars, sodium content, and whole grains.  Dining Out - Part 1  Clinical staff conducted group or individual video education with verbal and written material and guidebook.  Patient learns that restaurant meals can be sabotaging because they can be so high in calories, fat, sodium, and/or sugar. Patient learns recommended strategies on how to positively address this and avoid unhealthy pitfalls.  Facts on Fats  Clinical staff conducted group or individual video education with verbal and written material and guidebook.  Patient learns that lifestyle modifications can be just as effective, if not more so, as many medications for lowering your risk of heart disease. A Pritikin lifestyle can help to reduce your risk of inflammation and atherosclerosis (cholesterol build-up, or plaque, in the artery walls). Lifestyle interventions such as dietary choices and physical activity address the cause of atherosclerosis. A review of the types of fats and their impact on blood cholesterol levels, along with dietary recommendations to reduce fat intake is also included.  Nutrition Action Plan  Clinical staff conducted group or individual video education with verbal and written  material and guidebook.  Patient learns how to incorporate Pritikin recommendations into their lifestyle. Recommendations include planning and keeping personal health goals in mind as an important part of their success.  Healthy Mind-Set    Healthy Minds, Bodies, Hearts  Clinical staff conducted group or individual video education with verbal and written material and guidebook.  Patient learns how to identify when they are stressed. Video will discuss the impact of that stress, as well as the many benefits of stress management. Patient will also be introduced to stress management techniques. The way we think, act, and feel has an impact on our hearts.  How Our Thoughts Can Heal Our Hearts  Clinical staff conducted group or individual video education with verbal and written material and guidebook.  Patient learns that negative thoughts can  cause depression and anxiety. This can result in negative lifestyle behavior and serious health problems. Cognitive behavioral therapy is an effective method to help control our thoughts in order to change and improve our emotional outlook.  Additional Videos:  Exercise    Improving Performance  Clinical staff conducted group or individual video education with verbal and written material and guidebook.  Patient learns to use a non-linear approach by alternating intensity levels and lengths of time spent exercising to help burn more calories and lose more body fat. Cardiovascular exercise helps improve heart health, metabolism, hormonal balance, blood sugar control, and recovery from fatigue. Resistance training improves strength, endurance, balance, coordination, reaction time, metabolism, and muscle mass. Flexibility exercise improves circulation, posture, and balance. Seek guidance from your physician and exercise physiologist before implementing an exercise routine and learn your capabilities and proper form for all exercise.  Introduction to Yoga  Clinical  staff conducted group or individual video education with verbal and written material and guidebook.  Patient learns about yoga, a discipline of the coming together of mind, breath, and body. The benefits of yoga include improved flexibility, improved range of motion, better posture and core strength, increased lung function, weight loss, and positive self-image. Yoga's heart health benefits include lowered blood pressure, healthier heart rate, decreased cholesterol and triglyceride levels, improved immune function, and reduced stress. Seek guidance from your physician and exercise physiologist before implementing an exercise routine and learn your capabilities and proper form for all exercise.  Medical   Aging: Enhancing Your Quality of Life  Clinical staff conducted group or individual video education with verbal and written material and guidebook.  Patient learns key strategies and recommendations to stay in good physical health and enhance quality of life, such as prevention strategies, having an advocate, securing a Health Care Proxy and Power of Attorney, and keeping a list of medications and system for tracking them. It also discusses how to avoid risk for bone loss.  Biology of Weight Control  Clinical staff conducted group or individual video education with verbal and written material and guidebook.  Patient learns that weight gain occurs because we consume more calories than we burn (eating more, moving less). Even if your body weight is normal, you may have higher ratios of fat compared to muscle mass. Too much body fat puts you at increased risk for cardiovascular disease, heart attack, stroke, type 2 diabetes, and obesity-related cancers. In addition to exercise, following the Pritikin Eating Plan can help reduce your risk.  Decoding Lab Results  Clinical staff conducted group or individual video education with verbal and written material and guidebook.  Patient learns that lab test  reflects one measurement whose values change over time and are influenced by many factors, including medication, stress, sleep, exercise, food, hydration, pre-existing medical conditions, and more. It is recommended to use the knowledge from this video to become more involved with your lab results and evaluate your numbers to speak with your doctor.   Diseases of Our Time - Overview  Clinical staff conducted group or individual video education with verbal and written material and guidebook.  Patient learns that according to the CDC, 50% to 70% of chronic diseases (such as obesity, type 2 diabetes, elevated lipids, hypertension, and heart disease) are avoidable through lifestyle improvements including healthier food choices, listening to satiety cues, and increased physical activity.  Sleep Disorders Clinical staff conducted group or individual video education with verbal and written material and guidebook.  Patient learns how good quality  and duration of sleep are important to overall health and well-being. Patient also learns about sleep disorders and how they impact health along with recommendations to address them, including discussing with a physician.  Nutrition  Dining Out - Part 2 Clinical staff conducted group or individual video education with verbal and written material and guidebook.  Patient learns how to plan ahead and communicate in order to maximize their dining experience in a healthy and nutritious manner. Included are recommended food choices based on the type of restaurant the patient is visiting.   Fueling a Banker conducted group or individual video education with verbal and written material and guidebook.  There is a strong connection between our food choices and our health. Diseases like obesity and type 2 diabetes are very prevalent and are in large-part due to lifestyle choices. The Pritikin Eating Plan provides plenty of food and hunger-curbing  satisfaction. It is easy to follow, affordable, and helps reduce health risks.  Menu Workshop  Clinical staff conducted group or individual video education with verbal and written material and guidebook.  Patient learns that restaurant meals can sabotage health goals because they are often packed with calories, fat, sodium, and sugar. Recommendations include strategies to plan ahead and to communicate with the manager, chef, or server to help order a healthier meal.  Planning Your Eating Strategy  Clinical staff conducted group or individual video education with verbal and written material and guidebook.  Patient learns about the Pritikin Eating Plan and its benefit of reducing the risk of disease. The Pritikin Eating Plan does not focus on calories. Instead, it emphasizes high-quality, nutrient-rich foods. By knowing the characteristics of the foods, we choose, we can determine their calorie density and make informed decisions.  Targeting Your Nutrition Priorities  Clinical staff conducted group or individual video education with verbal and written material and guidebook.  Patient learns that lifestyle habits have a tremendous impact on disease risk and progression. This video provides eating and physical activity recommendations based on your personal health goals, such as reducing LDL cholesterol, losing weight, preventing or controlling type 2 diabetes, and reducing high blood pressure.  Vitamins and Minerals  Clinical staff conducted group or individual video education with verbal and written material and guidebook.  Patient learns different ways to obtain key vitamins and minerals, including through a recommended healthy diet. It is important to discuss all supplements you take with your doctor.   Healthy Mind-Set    Smoking Cessation  Clinical staff conducted group or individual video education with verbal and written material and guidebook.  Patient learns that cigarette smoking and  tobacco addiction pose a serious health risk which affects millions of people. Stopping smoking will significantly reduce the risk of heart disease, lung disease, and many forms of cancer. Recommended strategies for quitting are covered, including working with your doctor to develop a successful plan.  Culinary   Becoming a Set designer conducted group or individual video education with verbal and written material and guidebook.  Patient learns that cooking at home can be healthy, cost-effective, quick, and puts them in control. Keys to cooking healthy recipes will include looking at your recipe, assessing your equipment needs, planning ahead, making it simple, choosing cost-effective seasonal ingredients, and limiting the use of added fats, salts, and sugars.  Cooking - Breakfast and Snacks  Clinical staff conducted group or individual video education with verbal and written material and guidebook.  Patient learns how important breakfast  is to satiety and nutrition through the entire day. Recommendations include key foods to eat during breakfast to help stabilize blood sugar levels and to prevent overeating at meals later in the day. Planning ahead is also a key component.  Cooking - Educational psychologist conducted group or individual video education with verbal and written material and guidebook.  Patient learns eating strategies to improve overall health, including an approach to cook more at home. Recommendations include thinking of animal protein as a side on your plate rather than center stage and focusing instead on lower calorie dense options like vegetables, fruits, whole grains, and plant-based proteins, such as beans. Making sauces in large quantities to freeze for later and leaving the skin on your vegetables are also recommended to maximize your experience.  Cooking - Healthy Salads and Dressing Clinical staff conducted group or individual video education with  verbal and written material and guidebook.  Patient learns that vegetables, fruits, whole grains, and legumes are the foundations of the Pritikin Eating Plan. Recommendations include how to incorporate each of these in flavorful and healthy salads, and how to create homemade salad dressings. Proper handling of ingredients is also covered. Cooking - Soups and [employer] - Soups and Desserts Clinical staff conducted group or individual video education with verbal and written material and guidebook.  Patient learns that Pritikin soups and desserts make for easy, nutritious, and delicious snacks and meal components that are low in sodium, fat, sugar, and calorie density, while high in vitamins, minerals, and filling fiber. Recommendations include simple and healthy ideas for soups and desserts.   Overview     The Pritikin Solution Program Overview Clinical staff conducted group or individual video education with verbal and written material and guidebook.  Patient learns that the results of the Pritikin Program have been documented in more than 100 articles published in peer-reviewed journals, and the benefits include reducing risk factors for (and, in some cases, even reversing) high cholesterol, high blood pressure, type 2 diabetes, obesity, and more! An overview of the three key pillars of the Pritikin Program will be covered: eating well, doing regular exercise, and having a healthy mind-set.  WORKSHOPS  Exercise: Exercise Basics: Building Your Action Plan Clinical staff led group instruction and group discussion with PowerPoint presentation and patient guidebook. To enhance the learning environment the use of posters, models and videos may be added. At the conclusion of this workshop, patients will comprehend the difference between physical activity and exercise, as well as the benefits of incorporating both, into their routine. Patients will understand the FITT (Frequency, Intensity, Time,  and Type) principle and how to use it to build an exercise action plan. In addition, safety concerns and other considerations for exercise and cardiac rehab will be addressed by the presenter. The purpose of this lesson is to promote a comprehensive and effective weekly exercise routine in order to improve patients' overall level of fitness.   Managing Heart Disease: Your Path to a Healthier Heart Clinical staff led group instruction and group discussion with PowerPoint presentation and patient guidebook. To enhance the learning environment the use of posters, models and videos may be added.At the conclusion of this workshop, patients will understand the anatomy and physiology of the heart. Additionally, they will understand how Pritikin's three pillars impact the risk factors, the progression, and the management of heart disease.  The purpose of this lesson is to provide a high-level overview of the heart, heart disease, and how  the Pritikin lifestyle positively impacts risk factors.  Exercise Biomechanics Clinical staff led group instruction and group discussion with PowerPoint presentation and patient guidebook. To enhance the learning environment the use of posters, models and videos may be added. Patients will learn how the structural parts of their bodies function and how these functions impact their daily activities, movement, and exercise. Patients will learn how to promote a neutral spine, learn how to manage pain, and identify ways to improve their physical movement in order to promote healthy living. The purpose of this lesson is to expose patients to common physical limitations that impact physical activity. Participants will learn practical ways to adapt and manage aches and pains, and to minimize their effect on regular exercise. Patients will learn how to maintain good posture while sitting, walking, and lifting.  Balance Training and Fall Prevention  Clinical staff led group  instruction and group discussion with PowerPoint presentation and patient guidebook. To enhance the learning environment the use of posters, models and videos may be added. At the conclusion of this workshop, patients will understand the importance of their sensorimotor skills (vision, proprioception, and the vestibular system) in maintaining their ability to balance as they age. Patients will apply a variety of balancing exercises that are appropriate for their current level of function. Patients will understand the common causes for poor balance, possible solutions to these problems, and ways to modify their physical environment in order to minimize their fall risk. The purpose of this lesson is to teach patients about the importance of maintaining balance as they age and ways to minimize their risk of falling.  WORKSHOPS   Nutrition:  Fueling a Ship broker led group instruction and group discussion with PowerPoint presentation and patient guidebook. To enhance the learning environment the use of posters, models and videos may be added. Patients will review the foundational principles of the Pritikin Eating Plan and understand what constitutes a serving size in each of the food groups. Patients will also learn Pritikin-friendly foods that are better choices when away from home and review make-ahead meal and snack options. Calorie density will be reviewed and applied to three nutrition priorities: weight maintenance, weight loss, and weight gain. The purpose of this lesson is to reinforce (in a group setting) the key concepts around what patients are recommended to eat and how to apply these guidelines when away from home by planning and selecting Pritikin-friendly options. Patients will understand how calorie density may be adjusted for different weight management goals.  Mindful Eating  Clinical staff led group instruction and group discussion with PowerPoint presentation and patient  guidebook. To enhance the learning environment the use of posters, models and videos may be added. Patients will briefly review the concepts of the Pritikin Eating Plan and the importance of low-calorie dense foods. The concept of mindful eating will be introduced as well as the importance of paying attention to internal hunger signals. Triggers for non-hunger eating and techniques for dealing with triggers will be explored. The purpose of this lesson is to provide patients with the opportunity to review the basic principles of the Pritikin Eating Plan, discuss the value of eating mindfully and how to measure internal cues of hunger and fullness using the Hunger Scale. Patients will also discuss reasons for non-hunger eating and learn strategies to use for controlling emotional eating.  Targeting Your Nutrition Priorities Clinical staff led group instruction and group discussion with PowerPoint presentation and patient guidebook. To enhance the learning environment the  use of posters, models and videos may be added. Patients will learn how to determine their genetic susceptibility to disease by reviewing their family history. Patients will gain insight into the importance of diet as part of an overall healthy lifestyle in mitigating the impact of genetics and other environmental insults. The purpose of this lesson is to provide patients with the opportunity to assess their personal nutrition priorities by looking at their family history, their own health history and current risk factors. Patients will also be able to discuss ways of prioritizing and modifying the Pritikin Eating Plan for their highest risk areas  Menu  Clinical staff led group instruction and group discussion with PowerPoint presentation and patient guidebook. To enhance the learning environment the use of posters, models and videos may be added. Using menus brought in from [employer], or printed from [employer], patients will apply  the Pritikin dining out guidelines that were presented in the [employer] video. Patients will also be able to practice these guidelines in a variety of provided scenarios. The purpose of this lesson is to provide patients with the opportunity to practice hands-on learning of the Pritikin Dining Out guidelines with actual menus and practice scenarios.  Label Reading Clinical staff led group instruction and group discussion with PowerPoint presentation and patient guidebook. To enhance the learning environment the use of posters, models and videos may be added. Patients will review and discuss the Pritikin label reading guidelines presented in Pritikin's Label Reading Educational series video. Using fool labels brought in from local grocery stores and markets, patients will apply the label reading guidelines and determine if the packaged food meet the Pritikin guidelines. The purpose of this lesson is to provide patients with the opportunity to review, discuss, and practice hands-on learning of the Pritikin Label Reading guidelines with actual packaged food labels. Cooking School  Pritikin's [employer] are designed to teach patients ways to prepare quick, simple, and affordable recipes at home. The importance of nutrition's role in chronic disease risk reduction is reflected in its emphasis in the overall Pritikin program. By learning how to prepare essential core Pritikin Eating Plan recipes, patients will increase control over what they eat; be able to customize the flavor of foods without the use of added salt, sugar, or fat; and improve the quality of the food they consume. By learning a set of core recipes which are easily assembled, quickly prepared, and affordable, patients are more likely to prepare more healthy foods at home. These workshops focus on convenient breakfasts, simple entres, side dishes, and desserts which can be prepared with minimal effort and are  consistent with nutrition recommendations for cardiovascular risk reduction. Cooking [employer] are taught by a Armed forces logistics/support/administrative officer (RD) who has been trained by the [employer]. The chef or RD has a clear understanding of the importance of minimizing - if not completely eliminating - added fat, sugar, and sodium in recipes. Throughout the series of Cooking School Workshop sessions, patients will learn about healthy ingredients and efficient methods of cooking to build confidence in their capability to prepare    Cooking School weekly topics:  Adding Flavor- Sodium-Free  Fast and Healthy Breakfasts  Powerhouse Plant-Based Proteins  Satisfying Salads and Dressings  Simple Sides and Sauces  International Cuisine-Spotlight on the [employer] Zones  Delicious Desserts  Savory Soups  [employer] - Meals in a Astronomer Appetizers and Snacks  Comforting Weekend Breakfasts  One-Pot Wonders  Fast Evening Meals  Easy Entertaining  Personalizing Your Pritikin Plate  WORKSHOPS   Healthy Mindset (Psychosocial):  Focused Goals, Sustainable Changes Clinical staff led group instruction and group discussion with PowerPoint presentation and patient guidebook. To enhance the learning environment the use of posters, models and videos may be added. Patients will be able to apply effective goal setting strategies to establish at least one personal goal, and then take consistent, meaningful action toward that goal. They will learn to identify common barriers to achieving personal goals and develop strategies to overcome them. Patients will also gain an understanding of how our mind-set can impact our ability to achieve goals and the importance of cultivating a positive and growth-oriented mind-set. The purpose of this lesson is to provide patients with a deeper understanding of how to set and achieve personal goals, as well as the tools and strategies needed to overcome common  obstacles which may arise along the way.  From Head to Heart: The Power of a Healthy Outlook  Clinical staff led group instruction and group discussion with PowerPoint presentation and patient guidebook. To enhance the learning environment the use of posters, models and videos may be added. Patients will be able to recognize and describe the impact of emotions and mood on physical health. They will discover the importance of self-care and explore self-care practices which may work for them. Patients will also learn how to utilize the 4 C's to cultivate a healthier outlook and better manage stress and challenges. The purpose of this lesson is to demonstrate to patients how a healthy outlook is an essential part of maintaining good health, especially as they continue their cardiac rehab journey.  Healthy Sleep for a Healthy Heart Clinical staff led group instruction and group discussion with PowerPoint presentation and patient guidebook. To enhance the learning environment the use of posters, models and videos may be added. At the conclusion of this workshop, patients will be able to demonstrate knowledge of the importance of sleep to overall health, well-being, and quality of life. They will understand the symptoms of, and treatments for, common sleep disorders. Patients will also be able to identify daytime and nighttime behaviors which impact sleep, and they will be able to apply these tools to help manage sleep-related challenges. The purpose of this lesson is to provide patients with a general overview of sleep and outline the importance of quality sleep. Patients will learn about a few of the most common sleep disorders. Patients will also be introduced to the concept of "sleep hygiene," and discover ways to self-manage certain sleeping problems through simple daily behavior changes. Finally, the workshop will motivate patients by clarifying the links between quality sleep and their goals of heart-healthy  living.   Recognizing and Reducing Stress Clinical staff led group instruction and group discussion with PowerPoint presentation and patient guidebook. To enhance the learning environment the use of posters, models and videos may be added. At the conclusion of this workshop, patients will be able to understand the types of stress reactions, differentiate between acute and chronic stress, and recognize the impact that chronic stress has on their health. They will also be able to apply different coping mechanisms, such as reframing negative self-talk. Patients will have the opportunity to practice a variety of stress management techniques, such as deep abdominal breathing, progressive muscle relaxation, and/or guided imagery.  The purpose of this lesson is to educate patients on the role of stress in their lives and to provide healthy techniques for coping  with it.  Learning Barriers/Preferences:  Learning Barriers/Preferences - 08/10/23 1353       Learning Barriers/Preferences   Learning Barriers Sight    Learning Preferences Computer/Internet;Group Instruction;Individual Instruction;Skilled Demonstration;Written Material             Education Topics:  Knowledge Questionnaire Score:   Core Components/Risk Factors/Patient Goals at Admission:  Personal Goals and Risk Factors at Admission - 08/10/23 1355       Core Components/Risk Factors/Patient Goals on Admission   Heart Failure Yes    Intervention Provide a combined exercise and nutrition program that is supplemented with education, support and counseling about heart failure. Directed toward relieving symptoms such as shortness of breath, decreased exercise tolerance, and extremity edema.    Expected Outcomes Short term: Attendance in program 2-3 days a week with increased exercise capacity. Reported lower sodium intake. Reported increased fruit and vegetable intake. Reports medication compliance.;Improve functional capacity of  life;Short term: Daily weights obtained and reported for increase. Utilizing diuretic protocols set by physician.;Long term: Adoption of self-care skills and reduction of barriers for early signs and symptoms recognition and intervention leading to self-care maintenance.    Hypertension Yes    Intervention Provide education on lifestyle modifcations including regular physical activity/exercise, weight management, moderate sodium restriction and increased consumption of fresh fruit, vegetables, and low fat dairy, alcohol moderation, and smoking cessation.;Monitor prescription use compliance.    Expected Outcomes Short Term: Continued assessment and intervention until BP is < 140/42mm HG in hypertensive participants. < 130/67mm HG in hypertensive participants with diabetes, heart failure or chronic kidney disease.;Long Term: Maintenance of blood pressure at goal levels.    Lipids Yes    Intervention Provide education and support for participant on nutrition & aerobic/resistive exercise along with prescribed medications to achieve LDL 70mg , HDL >40mg .    Expected Outcomes Short Term: Participant states understanding of desired cholesterol values and is compliant with medications prescribed. Participant is following exercise prescription and nutrition guidelines.;Long Term: Cholesterol controlled with medications as prescribed, with individualized exercise RX and with personalized nutrition plan. Value goals: LDL < 70mg , HDL > 40 mg.    Stress Yes    Intervention Offer individual and/or small group education and counseling on adjustment to heart disease, stress management and health-related lifestyle change. Teach and support self-help strategies.;Refer participants experiencing significant psychosocial distress to appropriate mental health specialists for further evaluation and treatment. When possible, include family members and significant others in education/counseling sessions.    Expected Outcomes Short  Term: Participant demonstrates changes in health-related behavior, relaxation and other stress management skills, ability to obtain effective social support, and compliance with psychotropic medications if prescribed.;Long Term: Emotional wellbeing is indicated by absence of clinically significant psychosocial distress or social isolation.             Core Components/Risk Factors/Patient Goals Review:    Core Components/Risk Factors/Patient Goals at Discharge (Final Review):    ITP Comments:  ITP Comments     Row Name 08/10/23 1312           ITP Comments Introduction to [facility]/Intensive Cardiac Rehab. Initial Orientation Packet Reviewed with the patient                Comments: Participant attended orientation for the cardiac rehabilitation program on  08/10/2023  to perform initial intake and exercise walk test using rolling walker. Patient introduced to the [facility] education and orientation packet was reviewed. Completed 6-minute walk test, measurements, initial ITP, and exercise  prescription. Vital signs stable. Telemetry-normal sinus rhythm, asymptomatic.   Service time was from 10:31 to 12:48.

## 2023-08-10 NOTE — Progress Notes (Signed)
 Nichole Johnson in today for cardiac rehab orientation.  Pt completed 6 minute walk test.  Pre doppler BP 82, post walk 106 asymptomatic and 2 minute post 102.  Referred to the Cardiovascular assessment Tip which indicated to Report Doppler BP greater than 100.  Called the [facility] LV[facility] office and left message.  Received call back from [facility] RN.  Relayed readings and no symptoms reported to the VAD coordinator.  No further intervention warranted. Lettie Ray, BSN Cardiac and Emergency planning/management officer

## 2023-08-10 NOTE — Progress Notes (Signed)
 Cardiac Rehab Medication Review by a Nurse  Does the patient  feel that his/her medications are working for him/her?  yes  Has the patient been experiencing any side effects to the medications prescribed?  no  Does the patient measure his/her own blood pressure or blood glucose at home?  no   Does the patient have any problems obtaining medications due to transportation or finances?   no  Understanding of regimen: excellent Understanding of indications: excellent Potential of compliance: excellent    Nurse comments: Nichole Johnson is taking her medications as prescribed and has a good understanding of what her medications are for.    Nichole Johnson Nichole Darley RN 08/10/2023 1:10 PM

## 2023-08-13 ENCOUNTER — Ambulatory Visit (HOSPITAL_COMMUNITY)

## 2023-08-16 ENCOUNTER — Ambulatory Visit (HOSPITAL_COMMUNITY)

## 2023-08-16 ENCOUNTER — Encounter (HOSPITAL_COMMUNITY)
Admission: RE | Admit: 2023-08-16 | Discharge: 2023-08-16 | Disposition: A | Discharge status: Home / Self Care | Source: Ambulatory Visit | Attending: Cardiology | Admitting: Cardiology

## 2023-08-16 DIAGNOSIS — I5022 Chronic systolic (congestive) heart failure: Secondary | ICD-10-CM | POA: Insufficient documentation

## 2023-08-16 DIAGNOSIS — Z95811 Presence of heart assist device: Secondary | ICD-10-CM | POA: Insufficient documentation

## 2023-08-16 NOTE — Progress Notes (Signed)
 Daily Session Note  Patient Details  Name: Nichole Johnson MRN: [id number] Date of Birth: January 18, 1946 Referring Provider:   Flowsheet Row INTENSIVE CARDIAC REHAB ORIENT from 08/10/2023 in [facility]  Referring Provider Dr. Eben Golds covering)       Encounter Date: 08/16/2023  Check In:  Session Check In - 08/16/23 1428       Check-In   Supervising physician immediately available to respond to emergencies [facility] MD immediately available    Physician(s) Theresia Flasher, NP    Location [facility]    Staff Present Joann Mu, RN, Malvin Searing, MS, ACSM-CEP, CCRP, Exercise Physiologist;Samantha Belarus, [place], Toy Freund, RN, Lysbeth Sauger, MS, ACSM-CEP, Exercise Physiologist;Johnny Alexia Angelucci, MS, Exercise Physiologist;Casey Felipe Horton, RT    Virtual Visit No    Medication changes reported     No    Fall or balance concerns reported    No    Tobacco Cessation No Change    Current number of cigarettes/nicotine per day     0    Warm-up and Cool-down Performed as group-led instruction   Cardiac Rehab Orientation   Resistance Training Performed Yes    VAD Patient? Yes    PAD/SET Patient? No      VAD patient   Has back up controller? Yes    Has spare charged batteries? Yes    Has battery cables? Yes    Has compatible battery clips? Yes      Pain Assessment   Currently in Pain? No/denies    Multiple Pain Sites No             Capillary Blood Glucose: No results found for this or any previous visit (from the past 24 hours).   Exercise Prescription Changes - 08/16/23 1600       Response to Exercise   Blood Pressure (Admit) 90/0    Blood Pressure (Exercise) 94/0    Blood Pressure (Exit) 92/0    Heart Rate (Admit) 97 bpm    Heart Rate (Exercise) 101 bpm    Heart Rate (Exit) 95 bpm    Rating of Perceived Exertion (Exercise) 11    Symptoms None    Comments Pt's first day in the [facility]  program    Duration Continue with 30 min of aerobic exercise without signs/symptoms of physical distress.    Intensity THRR unchanged      Progression   Progression Continue to progress workloads to maintain intensity without signs/symptoms of physical distress.    Average METs 1.7      Resistance Training   Training Prescription Yes    Weight 2 lbs    Reps 10-15    Time 5 Minutes      Interval Training   Interval Training No      Recumbant Bike   Level 1.7    RPM 40    Watts 9    Minutes 15    METs 1.7      NuStep   Level 1    Minutes 15             Social History   Tobacco Use  Smoking Status Never   Passive exposure: Never  Smokeless Tobacco Never  Tobacco Comments   NEVER USED TOBACCO    Goals Met:  Exercise tolerated well No report of concerns or symptoms today Strength training completed today  Goals Unmet:  Not Applicable  Comments: Pt started cardiac rehab today.  Pt  tolerated light exercise without difficulty. VSS, telemetry-Sinus Rhythm, asymptomatic.  Medication list reconciled. Pt denies barriers to medicaiton compliance.  PSYCHOSOCIAL ASSESSMENT:  PHQ-8. [name] admits to having depression due to her heart failure diagnosis and recent LVAD placement. [name] said that she was taking an antidepressant while in the hospital. Nichole Johnson is not taking an antidepressant currently. Nichole Johnson denies the need for counselling at this time, Nichole Johnson said that she will let us  know if she changes her mind  Pt exhibits positive coping skills, hopeful outlook with supportive family. No psychosocial needs identified at this time, no psychosocial interventions necessary.    Pt enjoys cooking.   Pt oriented to exercise equipment and routine.    Understanding verbalized.Monte Antonio RN BSN    Dr. Gaylyn Keas is Medical Director for Cardiac Rehab at [facility].

## 2023-08-20 ENCOUNTER — Encounter (HOSPITAL_COMMUNITY)
Admission: RE | Admit: 2023-08-20 | Discharge: 2023-08-20 | Disposition: A | Discharge status: Home / Self Care | Source: Ambulatory Visit | Attending: Cardiology | Admitting: Cardiology

## 2023-08-20 ENCOUNTER — Ambulatory Visit (HOSPITAL_COMMUNITY)

## 2023-08-20 DIAGNOSIS — Z95811 Presence of heart assist device: Secondary | ICD-10-CM | POA: Diagnosis not present

## 2023-08-20 DIAGNOSIS — I5022 Chronic systolic (congestive) heart failure: Secondary | ICD-10-CM

## 2023-08-23 ENCOUNTER — Encounter (HOSPITAL_COMMUNITY)
Admission: RE | Admit: 2023-08-23 | Discharge: 2023-08-23 | Disposition: A | Discharge status: Home / Self Care | Source: Ambulatory Visit | Attending: Cardiology

## 2023-08-23 ENCOUNTER — Ambulatory Visit (HOSPITAL_COMMUNITY)

## 2023-08-23 DIAGNOSIS — I5022 Chronic systolic (congestive) heart failure: Secondary | ICD-10-CM

## 2023-08-23 DIAGNOSIS — Z95811 Presence of heart assist device: Secondary | ICD-10-CM

## 2023-08-27 ENCOUNTER — Ambulatory Visit (HOSPITAL_COMMUNITY)

## 2023-08-27 ENCOUNTER — Encounter (HOSPITAL_COMMUNITY)
Admission: RE | Admit: 2023-08-27 | Discharge: 2023-08-27 | Disposition: A | Discharge status: Home / Self Care | Source: Ambulatory Visit | Attending: Cardiology | Admitting: Cardiology

## 2023-08-27 DIAGNOSIS — I5022 Chronic systolic (congestive) heart failure: Secondary | ICD-10-CM

## 2023-08-27 DIAGNOSIS — Z95811 Presence of heart assist device: Secondary | ICD-10-CM

## 2023-08-30 ENCOUNTER — Ambulatory Visit (HOSPITAL_COMMUNITY)

## 2023-08-30 ENCOUNTER — Encounter (HOSPITAL_COMMUNITY)
Admission: RE | Admit: 2023-08-30 | Discharge: 2023-08-30 | Disposition: A | Discharge status: Home / Self Care | Source: Ambulatory Visit | Attending: Cardiology | Admitting: Cardiology

## 2023-08-30 DIAGNOSIS — Z95811 Presence of heart assist device: Secondary | ICD-10-CM

## 2023-08-31 NOTE — Progress Notes (Signed)
 Cardiac Individual Treatment Plan  Patient Details  Name: Nichole Johnson MRN: [id number] Date of Birth: 10-22-1945 Referring Provider:   Flowsheet Row INTENSIVE CARDIAC REHAB ORIENT from 08/10/2023 in [facility]  Referring Provider Dr. Mentz(Traci Turner covering)       Initial Encounter Date:  Flowsheet Row INTENSIVE CARDIAC REHAB ORIENT from 08/10/2023 in [facility]  Date 08/10/23       Visit Diagnosis: 03/25/23 S/P LVAD Implantation at [facility]  Patient's Home Medications on Admission:  Current Outpatient Medications:    acetaminophen  (TYLENOL ) 325 MG tablet, Take 325 mg by mouth every 4 (four) hours as needed for moderate pain (pain score 4-6). 2 tablets as needed, Disp: , Rfl:    albuterol  (PROVENTIL  HFA;VENTOLIN  HFA) 108 (90 Base) MCG/ACT inhaler, Inhale 2 puffs into the lungs every 6 (six) hours as needed for wheezing or shortness of breath. (Patient not taking: Reported on 08/10/2023), Disp: 1 Inhaler, Rfl: 6   alendronate (FOSAMAX) 35 MG tablet, Take 35 mg by mouth once a week. (Patient not taking: Reported on 08/10/2023), Disp: , Rfl:    atorvastatin  (LIPITOR) 40 MG tablet, Take 1 tablet (40 mg total) by mouth daily., Disp: , Rfl:    brinzolamide  (AZOPT ) 1 % ophthalmic suspension, Place 1 drop into both eyes 2 (two) times daily., Disp: , Rfl:    budesonide -formoterol (SYMBICORT) 80-4.5 MCG/ACT inhaler, Inhale 2 puffs into the lungs at bedtime., Disp: , Rfl:    Cholecalciferol 25 MCG (1000 UT) tablet, Take 1,000 Units by mouth daily. , Disp: , Rfl:    fluticasone  (FLONASE ) 50 MCG/ACT nasal spray, Place 2 sprays into both nostrils daily., Disp: 16 g, Rfl: 2   Hypromellose (GENTEAL OP), Place 1 drop into both eyes in the morning, at noon, in the evening, and at bedtime., Disp: , Rfl:    latanoprost  (XALATAN ) 0.005 % ophthalmic solution, Place 1 drop into both eyes at  bedtime., Disp: , Rfl:    levothyroxine  (SYNTHROID ) 125 MCG tablet, Take 125 mcg by mouth daily before breakfast., Disp: , Rfl:    Loratadine 10 MG CAPS, Take 10 mg by mouth daily., Disp: , Rfl:    nitroGLYCERIN  (NITROSTAT ) 0.4 MG SL tablet, Place 1 tablet (0.4 mg total) under the tongue every 5 (five) minutes as needed for chest pain (up to 3 doses)., Disp: 25 tablet, Rfl: 3   pantoprazole (PROTONIX) 40 MG tablet, Take 40 mg by mouth 2 (two) times daily., Disp: , Rfl:    sildenafil (REVATIO) 20 MG tablet, Take 10 mg by mouth 3 (three) times daily., Disp: , Rfl:    vitamin C  (ASCORBIC ACID ) 500 MG tablet, Take 500 mg by mouth daily., Disp: , Rfl:    warfarin (COUMADIN) 1 MG tablet, Take 1 mg by mouth daily at 4 PM. 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   Dilated cardiomyopathy (HCC) 04/10/2018   Dyspepsia 01/18/2017   Dysplastic nevus 12/02/2016  Glaucoma 10/14/2017   Hiatal hernia 07/20/2019   High cholesterol    History of breast cancer 04/23/2020   Hyperlipidemia 04/18/2014   Hypertension    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 Pt  was given copy of recent ECHO report.  Last Assessment & Plan:  Pt will f/u w/ Cardiology re this. Pt was given copy of recent ECHO report.    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 Added automatically from request for surgery [id number] Formatting of this note might be different from the original. Added automatically from request for surgery [id number]   Osteopenia of hip 04/21/2020   Formatting of this note might be different from the original. DEXA 06/2017: Lumbar T score -1.4, right total femur -1.4. She restarted alendronate since 05/2019. Formatting of this note might be different from the original. DEXA 06/2017: Lumbar T score -1.4, right total femur -1.4. She restarted alendronate since 05/2019.   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 Start using Flonase  daily. Stop using OTC Afrin Formatting of this note might be different from the original. Last Assessment & Plan: Start using Flonase  daily. Stop using OTC Afrin Formatting of this note might be different from the original. Overview: Last Assessment & Plan: Start using Flonase  daily. Stop usin   Stage 3a chronic kidney disease (HCC) 04/01/2021   Trigger finger of left hand 07/26/2018    Tobacco Use: Social History   Tobacco Use  Smoking Status Never   Passive exposure: Never  Smokeless Tobacco Never  Tobacco Comments   NEVER USED TOBACCO    Labs: Review Flowsheet  More data exists      Latest Ref Rng & Units 02/28/2023 03/01/2023 03/02/2023 03/03/2023 03/04/2023  Labs for ITP Cardiac and Pulmonary Rehab  O2 Saturation % 41.7   53.8  54.9  57.4  54.1  49.7  49.6     Details       Multiple values from one day are sorted in reverse-chronological order         Capillary Blood Glucose: No results found for: "GLUCAP"   Exercise Target Goals: Exercise Program Goal: Individual exercise prescription set using results from initial 6 min walk test and THRR while considering  patient's activity barriers and safety.   Exercise Prescription Goal: Initial exercise prescription builds to 30-45 minutes a day of aerobic activity, 2-3 days per week.  Home exercise guidelines will be given to patient during program as part of exercise prescription that the participant will acknowledge.  Activity Barriers & Risk Stratification:  Activity Barriers & Cardiac Risk Stratification - 08/10/23 1357       Activity Barriers & Cardiac Risk Stratification   Activity Barriers Balance Concerns;Arthritis;Joint Problems;Assistive Device;Neck/Spine Problems;Other (comment);Left Knee Replacement;Right Knee Replacement;Deconditioning  Comments Battery pack for LVAD    Cardiac Risk Stratification High             6 Minute Walk:  6 Minute Walk     Row Name 08/10/23 1356         6 Minute Walk   Phase Initial     Distance 1165 feet     Walk Time 6 minutes     # of Rest Breaks 0     MPH 2.21     METS 2     RPE 11     Perceived Dyspnea  1     VO2 Peak 7     Symptoms No     Resting HR 89 bpm     Resting BP 82/0     Resting Oxygen Saturation  98 %     Exercise Oxygen Saturation  during 6 min walk 98 %     Max Ex. HR 110 bpm     Max Ex. BP 106/0     2 Minute Post BP 102/0              Oxygen Initial Assessment:   Oxygen Re-Evaluation:   Oxygen Discharge (Final Oxygen Re-Evaluation):   Initial Exercise Prescription:  Initial Exercise Prescription - 08/10/23 1400       Date of Initial Exercise RX and Referring Provider   Date 08/10/23    Referring Provider Dr. Mentz(Traci Turner covering)    Expected  Discharge Date 11/03/23      Recumbant Bike   Level 1    RPM 60    Watts 25    Minutes 15    METs 2      NuStep   Level 1    SPM 75    Minutes 15    METs 2      Prescription Details   Frequency (times per week) 2    Duration Progress to 30 minutes of continuous aerobic without signs/symptoms of physical distress      Intensity   THRR 40-80% of Max Heartrate 56-113    Ratings of Perceived Exertion 11-13    Perceived Dyspnea 0-4      Progression   Progression Continue progressive overload as per policy without signs/symptoms or physical distress.      Resistance Training   Training Prescription Yes    Weight 2 lbs    Reps 10-15             Perform Capillary Blood Glucose checks as needed.  Exercise Prescription Changes:   Exercise Prescription Changes     Row Name 08/16/23 1600 08/30/23 1400           Response to Exercise   Blood Pressure (Admit) 90/0 88/0      Blood Pressure (Exercise) 94/0 92/0      Blood Pressure (Exit) 92/0 86/0      Heart Rate (Admit) 97 bpm 90 bpm      Heart Rate (Exercise) 101 bpm 113 bpm      Heart Rate (Exit) 95 bpm 90 bpm      Rating of Perceived Exertion (Exercise) 11 11      Symptoms None None      Comments Pt's first day in the CRP2 program Reviewed METs      Duration Continue with 30 min of aerobic exercise without signs/symptoms of physical distress. Continue with 30 min of aerobic exercise without signs/symptoms of physical distress.      Intensity THRR unchanged  THRR unchanged        Progression   Progression Continue to progress workloads to maintain intensity without signs/symptoms of physical distress. Continue to progress workloads to maintain intensity without signs/symptoms of physical distress.      Average METs 1.7 1.7        Resistance Training   Training Prescription Yes Yes      Weight 2 lbs 2 lbs      Reps 10-15 10-15      Time 5 Minutes 5 Minutes        Interval Training   Interval Training No No         Recumbant Bike   Level 1.7 1.7      RPM 40 37      Watts 9 8      Minutes 15 15      METs 1.7 --        NuStep   Level 1 1      SPM -- 57      Minutes 15 15      METs -- 1.7               Exercise Comments:   Exercise Comments     Row Name 08/16/23 1638 08/30/23 1418         Exercise Comments Pt's first day in the CRP2 program. Pt is off to a good start. Exercised with no complaints. Reviewed METs with pt. Pt is averaging 1.8 METs. Pt is progressing slowly through the program. Will continue to monitor and encourage to increase her SPM. Pt reports that her current exercise is fairly light.               Exercise Goals and Review:   Exercise Goals     Row Name 08/10/23 1358             Exercise Goals   Increase Physical Activity Yes       Intervention Provide advice, education, support and counseling about physical activity/exercise needs.;Develop an individualized exercise prescription for aerobic and resistive training based on initial evaluation findings, risk stratification, comorbidities and participant's personal goals.       Expected Outcomes Short Term: Attend rehab on a regular basis to increase amount of physical activity.;Long Term: Exercising regularly at least 3-5 days a week.;Long Term: Add in home exercise to make exercise part of routine and to increase amount of physical activity.       Increase Strength and Stamina Yes       Intervention Provide advice, education, support and counseling about physical activity/exercise needs.;Develop an individualized exercise prescription for aerobic and resistive training based on initial evaluation findings, risk stratification, comorbidities and participant's personal goals.       Expected Outcomes Short Term: Increase workloads from initial exercise prescription for resistance, speed, and METs.;Short Term: Perform resistance training exercises routinely during rehab and add in resistance training at  home;Long Term: Improve cardiorespiratory fitness, muscular endurance and strength as measured by increased METs and functional capacity ( )       Able to understand and use rate of perceived exertion (RPE) scale Yes       Intervention Provide education and explanation on how to use RPE scale       Expected Outcomes Short Term: Able to use RPE daily in rehab to express subjective intensity level;Long Term:  Able to use RPE to guide intensity level when exercising independently       Knowledge and understanding  of Target Heart Rate Range (THRR) Yes       Intervention Provide education and explanation of THRR including how the numbers were predicted and where they are located for reference       Expected Outcomes Short Term: Able to state/look up THRR;Long Term: Able to use THRR to govern intensity when exercising independently;Short Term: Able to use daily as guideline for intensity in rehab       Understanding of Exercise Prescription Yes       Intervention Provide education, explanation, and written materials on patient's individual exercise prescription       Expected Outcomes Short Term: Able to explain program exercise prescription;Long Term: Able to explain home exercise prescription to exercise independently                Exercise Goals Re-Evaluation :  Exercise Goals Re-Evaluation     Row Name 08/16/23 1637 08/30/23 1416           Exercise Goal Re-Evaluation   Exercise Goals Review Increase Physical Activity;Understanding of Exercise Prescription;Increase Strength and Stamina;Knowledge and understanding of Target Heart Rate Range (THRR);Able to understand and use rate of perceived exertion (RPE) scale Increase Physical Activity;Understanding of Exercise Prescription;Increase Strength and Stamina;Knowledge and understanding of Target Heart Rate Range (THRR);Able to understand and use rate of perceived exertion (RPE) scale      Comments Pt's first day in the CRP2 program. Pt  understands the exercise Rx, RPE scale and THRR. Reviewed METs with pt. Pt is averaging 1.8 METs. Pt is progressing slowly through the program. Will continue to monitor and encourage to increase her SPM. Pt reports that her current exercise is fairly light.      Expected Outcomes Will continue to monitor the patient and increase exercise workloads as tolerated. Will continue to monitor the patient and increase exercise workloads as tolerated.               Discharge Exercise Prescription (Final Exercise Prescription Changes):  Exercise Prescription Changes - 08/30/23 1400       Response to Exercise   Blood Pressure (Admit) 88/0    Blood Pressure (Exercise) 92/0    Blood Pressure (Exit) 86/0    Heart Rate (Admit) 90 bpm    Heart Rate (Exercise) 113 bpm    Heart Rate (Exit) 90 bpm    Rating of Perceived Exertion (Exercise) 11    Symptoms None    Comments Reviewed METs    Duration Continue with 30 min of aerobic exercise without signs/symptoms of physical distress.    Intensity THRR unchanged      Progression   Progression Continue to progress workloads to maintain intensity without signs/symptoms of physical distress.    Average METs 1.7      Resistance Training   Training Prescription Yes    Weight 2 lbs    Reps 10-15    Time 5 Minutes      Interval Training   Interval Training No      Recumbant Bike   Level 1.7    RPM 37    Watts 8    Minutes 15      NuStep   Level 1    SPM 57    Minutes 15    METs 1.7             Nutrition:  Target Goals: Understanding of nutrition guidelines, daily intake of sodium 1500mg , cholesterol 200mg , calories 30% from fat and 7% or less from saturated fats, daily  to have 5 or more servings of fruits and vegetables.  Biometrics:  Pre Biometrics - 08/10/23 1359       Pre Biometrics   Waist Circumference 35.5 inches    Hip Circumference 36.5 inches    Waist to Hip Ratio 0.97 %    Triceps Skinfold 17 mm    % Body Fat 37.1 %     Grip Strength 12 kg    Flexibility --   Not performed   Single Leg Stand 7 seconds              Nutrition Therapy Plan and Nutrition Goals:  Nutrition Therapy & Goals - 08/19/23 1121       Nutrition Therapy   Diet Heart Healthy diet    Drug/Food Interactions Coumadin/Vit K;Statins/Certain Fruits      Personal Nutrition Goals   Nutrition Goal Patient to identify strategies for reducing cardiovascular risk by attending the [employer] education and nutrition series weekly.    Personal Goal #2 Patient to improve diet quality by using the plate method as a guide for meal planning to include lean protein/plant protein, fruits, vegetables, whole grains, nonfat dairy as part of a well-balanced diet.    Comments Patient has medical history of  breast cancers s/p lumpectomy, chemo (adriamycin) and radiation (2000), Adriamycin induced NICM, HFrEF, CAD s/p NSTEMI, HLD, HTN, pre-diabetes, restrictive lung disease, OSA, CKD IIIb, hypothyroidism, s/p LVAD implantation. LDL/HDL at goal. Patient will benefit from participation in intensive cardiac rehab for nutrition, exercise, and lifestyle modification.      Intervention Plan   Intervention Prescribe, educate and counsel regarding individualized specific dietary modifications aiming towards targeted core components such as weight, hypertension, lipid management, diabetes, heart failure and other comorbidities.;Nutrition handout(s) given to patient.    Expected Outcomes Short Term Goal: Understand basic principles of dietary content, such as calories, fat, sodium, cholesterol and nutrients.;Long Term Goal: Adherence to prescribed nutrition plan.             Nutrition Assessments:  MEDIFICTS Score Key: >=70 Need to make dietary changes  40-70 Heart Healthy Diet <= 40 Therapeutic Level Cholesterol Diet    Picture Your Plate Scores: <78 Unhealthy dietary pattern with much room for improvement. 41-50 Dietary pattern unlikely to meet  recommendations for good health and room for improvement. 51-60 More healthful dietary pattern, with some room for improvement.  >60 Healthy dietary pattern, although there may be some specific behaviors that could be improved.    Nutrition Goals Re-Evaluation:  Nutrition Goals Re-Evaluation     Row Name 08/19/23 1121             Goals   Current Weight 126 lb 12.2 oz (57.5 kg)       Comment LDL 53, HDL 45; patient continues follow-up with coumadin clinic       Expected Outcome Patient has medical history of breast cancers s/p lumpectomy, chemo (adriamycin) and radiation (2000), Adriamycin induced NICM, HFrEF, CAD s/p NSTEMI, HLD, HTN, pre-diabetes, restrictive lung disease, OSA, CKD IIIb, hypothyroidism, s/p LVAD implantation. LDL/HDL at goal. Patient will benefit from participation in intensive cardiac rehab for nutrition, exercise, and lifestyle modification.                Nutrition Goals Re-Evaluation:  Nutrition Goals Re-Evaluation     Row Name 08/19/23 1121             Goals   Current Weight 126 lb 12.2 oz (57.5 kg)       Comment  LDL 53, HDL 45; patient continues follow-up with coumadin clinic       Expected Outcome Patient has medical history of breast cancers s/p lumpectomy, chemo (adriamycin) and radiation (2000), Adriamycin induced NICM, HFrEF, CAD s/p NSTEMI, HLD, HTN, pre-diabetes, restrictive lung disease, OSA, CKD IIIb, hypothyroidism, s/p LVAD implantation. LDL/HDL at goal. Patient will benefit from participation in intensive cardiac rehab for nutrition, exercise, and lifestyle modification.                Nutrition Goals Discharge (Final Nutrition Goals Re-Evaluation):  Nutrition Goals Re-Evaluation - 08/19/23 1121       Goals   Current Weight 126 lb 12.2 oz (57.5 kg)    Comment LDL 53, HDL 45; patient continues follow-up with coumadin clinic    Expected Outcome Patient has medical history of breast cancers s/p lumpectomy, chemo (adriamycin) and  radiation (2000), Adriamycin induced NICM, HFrEF, CAD s/p NSTEMI, HLD, HTN, pre-diabetes, restrictive lung disease, OSA, CKD IIIb, hypothyroidism, s/p LVAD implantation. LDL/HDL at goal. Patient will benefit from participation in intensive cardiac rehab for nutrition, exercise, and lifestyle modification.             Psychosocial: Target Goals: Acknowledge presence or absence of significant depression and/or stress, maximize coping skills, provide positive support system. Participant is able to verbalize types and ability to use techniques and skills needed for reducing stress and depression.  Initial Review & Psychosocial Screening:  Initial Psych Review & Screening - 08/10/23 1333       Initial Review   Current issues with History of Depression;Current Anxiety/Panic;Current Depression;Current Stress Concerns    Source of Stress Concerns Chronic Illness;Unable to perform yard/household activities;Unable to participate in former interests or hobbies    Comments Schaner admits to having depression due to her heart failure diagnosis and recent LVAD placement. Marwa said that she was taking an antidepressant while in the hospital. Faatima is not taking an antidepressant currently. Belenda denies the need for counselling at this time, Shaquasha said that she will let us  know if she changes her mind      Family Dynamics   Good Support System? Yes   Tabita has her husband and 2 sons for support     Barriers   Psychosocial barriers to participate in program The patient should benefit from training in stress management and relaxation.      Screening Interventions   Interventions Encouraged to exercise    Expected Outcomes Long Term Goal: Stressors or current issues are controlled or eliminated.;Short Term goal: Identification and review with participant of any Quality of Life or Depression concerns found by scoring the questionnaire.;Long Term goal: The participant improves quality of Life and PHQ9 Scores as seen by  post scores and/or verbalization of changes             Quality of Life Scores:  Quality of Life - 08/10/23 1353       Quality of Life   Select Quality of Life      Quality of Life Scores   Health/Function Pre 17.35 %    Socioeconomic Pre 22 %    Psych/Spiritual Pre 18.93 %    Family Pre 30 %    GLOBAL Pre 20.65 %            Scores of 19 and below usually indicate a poorer quality of life in these areas.  A difference of  2-3 points is a clinically meaningful difference.  A difference of 2-3 points in the total score  of the Quality of Life Index has been associated with significant improvement in overall quality of life, self-image, physical symptoms, and general health in studies assessing change in quality of life.  PHQ-9: Review Flowsheet       08/10/2023  Depression screen PHQ 2/9  Decreased Interest 1  Down, Depressed, Hopeless 1  PHQ - 2 Score 2  Altered sleeping 0  Tired, decreased energy 2  Change in appetite 1  Feeling bad or failure about yourself  1  Trouble concentrating 1  Moving slowly or fidgety/restless 1  Suicidal thoughts 0  PHQ-9 Score 8  Difficult doing work/chores Somewhat difficult   Interpretation of Total Score  Total Score Depression Severity:  1-4 = Minimal depression, 5-9 = Mild depression, 10-14 = Moderate depression, 15-19 = Moderately severe depression, 20-27 = Severe depression   Psychosocial Evaluation and Intervention:   Psychosocial Re-Evaluation:  Psychosocial Re-Evaluation     Row Name 08/18/23 0840 08/31/23 1637           Psychosocial Re-Evaluation   Current issues with Current Depression;History of Depression;Current Stress Concerns;Current Anxiety/Panic Current Depression;History of Depression;Current Stress Concerns;Current Anxiety/Panic      Comments Carliyah did not voice any increased concerns or stressors on her first day of exercise at cardiac rehab. Will review quality of life questionnaire in the upcoming week  quality of life questionnaire reviewed. Patient quality of life slightly altered by physical constraints which limits ability to perform as prior to recent cardiac illness.  Karyna admits to being depressed due her hear failure diagnosis, S/P LVAD implantation. Nyra says she is limited to what she can do as she has the LVAD and the batteries.      Expected Outcomes Zyaire will have controlled or decreased deprression upon completion of cardiac rehab Parneet will have controlled or decreased deprression upon completion of cardiac rehab      Interventions Relaxation education;Stress management education;Encouraged to attend Cardiac Rehabilitation for the exercise Relaxation education;Stress management education;Encouraged to attend Cardiac Rehabilitation for the exercise      Continue Psychosocial Services  Follow up required by staff Follow up required by staff        Initial Review   Source of Stress Concerns Chronic Illness;Unable to participate in former interests or hobbies;Unable to perform yard/household activities Chronic Illness;Unable to participate in former interests or hobbies;Unable to perform yard/household activities      Comments Will continue to monitor and offer support as needed Will continue to monitor and offer support as needed               Psychosocial Discharge (Final Psychosocial Re-Evaluation):  Psychosocial Re-Evaluation - 08/31/23 1637       Psychosocial Re-Evaluation   Current issues with Current Depression;History of Depression;Current Stress Concerns;Current Anxiety/Panic    Comments quality of life questionnaire reviewed. Patient quality of life slightly altered by physical constraints which limits ability to perform as prior to recent cardiac illness.  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 Mehak says she is limited to what she can do as she has the LVAD and the batteries.    Expected Outcomes Jamison will have controlled or  decreased deprression upon completion of cardiac rehab    Interventions Relaxation education;Stress management education;Encouraged to attend Cardiac Rehabilitation for the exercise    Continue Psychosocial Services  Follow up required by staff      Initial Review   Source of Stress Concerns Chronic Illness;Unable to participate in  former interests or hobbies;Unable to perform yard/household activities    Comments Will continue to monitor and offer support as needed             Vocational Rehabilitation: Provide vocational rehab assistance to qualifying candidates.   Vocational Rehab Evaluation & Intervention:  Vocational Rehab - 08/10/23 1354       Initial Vocational Rehab Evaluation & Intervention   Assessment shows need for Vocational Rehabilitation No             Education: Education Goals: Education classes will be provided on a weekly basis, covering required topics. Participant will state understanding/return demonstration of topics presented.    Education     Row Name 08/20/23 1600     Education   Cardiac Education Topics Pritikin   Select Workshops     Workshops   Educator Exercise Physiologist   Select Exercise   Exercise Workshop Location manager and Fall Prevention   Instruction Review Code 1- Verbalizes Understanding   Class Start Time 1407   Class Stop Time 1447   Class Time Calculation (min) 40 min    Row Name 08/23/23 1500     Education   Cardiac Education Topics Pritikin   Glass blower/designer Nutrition   Nutrition Workshop Label Reading   Instruction Review Code 1- Tax inspector   Class Start Time 1400   Class Stop Time 1437   Class Time Calculation (min) 37 min    Row Name 08/30/23 1500     Education   Cardiac Education Topics Pritikin   Hospital doctor Education   General Education Metabolic Syndrome and  Belly Fat   Instruction Review Code 1- Verbalizes Understanding   Class Start Time 1355   Class Stop Time 1440   Class Time Calculation (min) 45 min            Core Videos: Exercise    Move It!  Clinical staff conducted group or individual video education with verbal and written material and guidebook.  Patient learns the recommended Pritikin exercise program. Exercise with the goal of living a long, healthy life. Some of the health benefits of exercise include controlled diabetes, healthier blood pressure levels, improved cholesterol levels, improved heart and lung capacity, improved sleep, and better body composition. Everyone should speak with their doctor before starting or changing an exercise routine.  Biomechanical Limitations Clinical staff conducted group or individual video education with verbal and written material and guidebook.  Patient learns how biomechanical limitations can impact exercise and how we can mitigate and possibly overcome limitations to have an impactful and balanced exercise routine.  Body Composition Clinical staff conducted group or individual video education with verbal and written material and guidebook.  Patient learns that body composition (ratio of muscle mass to fat mass) is a key component to assessing overall fitness, rather than body weight alone. Increased fat mass, especially visceral belly fat, can put us  at increased risk for metabolic syndrome, type 2 diabetes, heart disease, and even death. It is recommended to combine diet and exercise (cardiovascular and resistance training) to improve your body composition. Seek guidance from your physician and exercise physiologist before implementing an exercise routine.  Exercise Action Plan Clinical staff conducted group or individual video education with verbal and written material and guidebook.  Patient learns the recommended strategies to achieve and enjoy  long-term exercise adherence, including  variety, self-motivation, self-efficacy, and positive decision making. Benefits of exercise include fitness, good health, weight management, more energy, better sleep, less stress, and overall well-being.  Medical   Heart Disease Risk Reduction Clinical staff conducted group or individual video education with verbal and written material and guidebook.  Patient learns our heart is our most vital organ as it circulates oxygen, nutrients, white blood cells, and hormones throughout the entire body, and carries waste away. Data supports a plant-based eating plan like the Pritikin Program for its effectiveness in slowing progression of and reversing heart disease. The video provides a number of recommendations to address heart disease.   Metabolic Syndrome and Belly Fat  Clinical staff conducted group or individual video education with verbal and written material and guidebook.  Patient learns what metabolic syndrome is, how it leads to heart disease, and how one can reverse it and keep it from coming back. You have metabolic syndrome if you have 3 of the following 5 criteria: abdominal obesity, high blood pressure, high triglycerides, low HDL cholesterol, and high blood sugar.  Hypertension and Heart Disease Clinical staff conducted group or individual video education with verbal and written material and guidebook.  Patient learns that high blood pressure, or hypertension, is very common in the [place] . Hypertension is largely due to excessive salt intake, but other important risk factors include being overweight, physical inactivity, drinking too much alcohol, smoking, and not eating enough potassium from fruits and vegetables. High blood pressure is a leading risk factor for heart attack, stroke, congestive heart failure, dementia, kidney failure, and premature death. Long-term effects of excessive salt intake include stiffening of the arteries and thickening of heart muscle and organ damage.  Recommendations include ways to reduce hypertension and the risk of heart disease.  Diseases of Our Time - Focusing on Diabetes Clinical staff conducted group or individual video education with verbal and written material and guidebook.  Patient learns why the best way to stop diseases of our time is prevention, through food and other lifestyle changes. Medicine (such as prescription pills and surgeries) is often only a Band-Aid on the problem, not a long-term solution. Most common diseases of our time include obesity, type 2 diabetes, hypertension, heart disease, and cancer. The Pritikin Program is recommended and has been proven to help reduce, reverse, and/or prevent the damaging effects of metabolic syndrome.  Nutrition   Overview of the Pritikin Eating Plan  Clinical staff conducted group or individual video education with verbal and written material and guidebook.  Patient learns about the Pritikin Eating Plan for disease risk reduction. The Pritikin Eating Plan emphasizes a wide variety of unrefined, minimally-processed carbohydrates, like fruits, vegetables, whole grains, and legumes. Go, Caution, and Stop food choices are explained. Plant-based and lean animal proteins are emphasized. Rationale provided for low sodium intake for blood pressure control, low added sugars for blood sugar stabilization, and low added fats and oils for coronary artery disease risk reduction and weight management.  Calorie Density  Clinical staff conducted group or individual video education with verbal and written material and guidebook.  Patient learns about calorie density and how it impacts the Pritikin Eating Plan. Knowing the characteristics of the food you choose will help you decide whether those foods will lead to weight gain or weight loss, and whether you want to consume more or less of them. Weight loss is usually a side effect of the Pritikin Eating Plan because of its focus on low calorie-dense  foods.  Label Reading  Clinical staff conducted group or individual video education with verbal and written material and guidebook.  Patient learns about the Pritikin recommended label reading guidelines and corresponding recommendations regarding calorie density, added sugars, sodium content, and whole grains.  Dining Out - Part 1  Clinical staff conducted group or individual video education with verbal and written material and guidebook.  Patient learns that restaurant meals can be sabotaging because they can be so high in calories, fat, sodium, and/or sugar. Patient learns recommended strategies on how to positively address this and avoid unhealthy pitfalls.  Facts on Fats  Clinical staff conducted group or individual video education with verbal and written material and guidebook.  Patient learns that lifestyle modifications can be just as effective, if not more so, as many medications for lowering your risk of heart disease. A Pritikin lifestyle can help to reduce your risk of inflammation and atherosclerosis (cholesterol build-up, or plaque, in the artery walls). Lifestyle interventions such as dietary choices and physical activity address the cause of atherosclerosis. A review of the types of fats and their impact on blood cholesterol levels, along with dietary recommendations to reduce fat intake is also included.  Nutrition Action Plan  Clinical staff conducted group or individual video education with verbal and written material and guidebook.  Patient learns how to incorporate Pritikin recommendations into their lifestyle. Recommendations include planning and keeping personal health goals in mind as an important part of their success.  Healthy Mind-Set    Healthy Minds, Bodies, Hearts  Clinical staff conducted group or individual video education with verbal and written material and guidebook.  Patient learns how to identify when they are stressed. Video will discuss the impact of that  stress, as well as the many benefits of stress management. Patient will also be introduced to stress management techniques. The way we think, act, and feel has an impact on our hearts.  How Our Thoughts Can Heal Our Hearts  Clinical staff conducted group or individual video education with verbal and written material and guidebook.  Patient learns that negative thoughts can cause depression and anxiety. This can result in negative lifestyle behavior and serious health problems. Cognitive behavioral therapy is an effective method to help control our thoughts in order to change and improve our emotional outlook.  Additional Videos:  Exercise    Improving Performance  Clinical staff conducted group or individual video education with verbal and written material and guidebook.  Patient learns to use a non-linear approach by alternating intensity levels and lengths of time spent exercising to help burn more calories and lose more body fat. Cardiovascular exercise helps improve heart health, metabolism, hormonal balance, blood sugar control, and recovery from fatigue. Resistance training improves strength, endurance, balance, coordination, reaction time, metabolism, and muscle mass. Flexibility exercise improves circulation, posture, and balance. Seek guidance from your physician and exercise physiologist before implementing an exercise routine and learn your capabilities and proper form for all exercise.  Introduction to Yoga  Clinical staff conducted group or individual video education with verbal and written material and guidebook.  Patient learns about yoga, a discipline of the coming together of mind, breath, and body. The benefits of yoga include improved flexibility, improved range of motion, better posture and core strength, increased lung function, weight loss, and positive self-image. Yoga's heart health benefits include lowered blood pressure, healthier heart rate, decreased cholesterol and  triglyceride levels, improved immune function, and reduced stress. Seek guidance from your physician and exercise physiologist  before implementing an exercise routine and learn your capabilities and proper form for all exercise.  Medical   Aging: Enhancing Your Quality of Life  Clinical staff conducted group or individual video education with verbal and written material and guidebook.  Patient learns key strategies and recommendations to stay in good physical health and enhance quality of life, such as prevention strategies, having an advocate, securing a Health Care Proxy and Power of Attorney, and keeping a list of medications and system for tracking them. It also discusses how to avoid risk for bone loss.  Biology of Weight Control  Clinical staff conducted group or individual video education with verbal and written material and guidebook.  Patient learns that weight gain occurs because we consume more calories than we burn (eating more, moving less). Even if your body weight is normal, you may have higher ratios of fat compared to muscle mass. Too much body fat puts you at increased risk for cardiovascular disease, heart attack, stroke, type 2 diabetes, and obesity-related cancers. In addition to exercise, following the Pritikin Eating Plan can help reduce your risk.  Decoding Lab Results  Clinical staff conducted group or individual video education with verbal and written material and guidebook.  Patient learns that lab test reflects one measurement whose values change over time and are influenced by many factors, including medication, stress, sleep, exercise, food, hydration, pre-existing medical conditions, and more. It is recommended to use the knowledge from this video to become more involved with your lab results and evaluate your numbers to speak with your doctor.   Diseases of Our Time - Overview  Clinical staff conducted group or individual video education with verbal and written  material and guidebook.  Patient learns that according to the CDC, 50% to 70% of chronic diseases (such as obesity, type 2 diabetes, elevated lipids, hypertension, and heart disease) are avoidable through lifestyle improvements including healthier food choices, listening to satiety cues, and increased physical activity.  Sleep Disorders Clinical staff conducted group or individual video education with verbal and written material and guidebook.  Patient learns how good quality and duration of sleep are important to overall health and well-being. Patient also learns about sleep disorders and how they impact health along with recommendations to address them, including discussing with a physician.  Nutrition  Dining Out - Part 2 Clinical staff conducted group or individual video education with verbal and written material and guidebook.  Patient learns how to plan ahead and communicate in order to maximize their dining experience in a healthy and nutritious manner. Included are recommended food choices based on the type of restaurant the patient is visiting.   Fueling a Banker conducted group or individual video education with verbal and written material and guidebook.  There is a strong connection between our food choices and our health. Diseases like obesity and type 2 diabetes are very prevalent and are in large-part due to lifestyle choices. The Pritikin Eating Plan provides plenty of food and hunger-curbing satisfaction. It is easy to follow, affordable, and helps reduce health risks.  Menu Workshop  Clinical staff conducted group or individual video education with verbal and written material and guidebook.  Patient learns that restaurant meals can sabotage health goals because they are often packed with calories, fat, sodium, and sugar. Recommendations include strategies to plan ahead and to communicate with the manager, chef, or server to help order a healthier  meal.  Planning Your Eating Strategy  Clinical staff conducted group  or individual video education with verbal and written material and guidebook.  Patient learns about the Pritikin Eating Plan and its benefit of reducing the risk of disease. The Pritikin Eating Plan does not focus on calories. Instead, it emphasizes high-quality, nutrient-rich foods. By knowing the characteristics of the foods, we choose, we can determine their calorie density and make informed decisions.  Targeting Your Nutrition Priorities  Clinical staff conducted group or individual video education with verbal and written material and guidebook.  Patient learns that lifestyle habits have a tremendous impact on disease risk and progression. This video provides eating and physical activity recommendations based on your personal health goals, such as reducing LDL cholesterol, losing weight, preventing or controlling type 2 diabetes, and reducing high blood pressure.  Vitamins and Minerals  Clinical staff conducted group or individual video education with verbal and written material and guidebook.  Patient learns different ways to obtain key vitamins and minerals, including through a recommended healthy diet. It is important to discuss all supplements you take with your doctor.   Healthy Mind-Set    Smoking Cessation  Clinical staff conducted group or individual video education with verbal and written material and guidebook.  Patient learns that cigarette smoking and tobacco addiction pose a serious health risk which affects millions of people. Stopping smoking will significantly reduce the risk of heart disease, lung disease, and many forms of cancer. Recommended strategies for quitting are covered, including working with your doctor to develop a successful plan.  Culinary   Becoming a Set designer conducted group or individual video education with verbal and written material and guidebook.  Patient learns  that cooking at home can be healthy, cost-effective, quick, and puts them in control. Keys to cooking healthy recipes will include looking at your recipe, assessing your equipment needs, planning ahead, making it simple, choosing cost-effective seasonal ingredients, and limiting the use of added fats, salts, and sugars.  Cooking - Breakfast and Snacks  Clinical staff conducted group or individual video education with verbal and written material and guidebook.  Patient learns how important breakfast is to satiety and nutrition through the entire day. Recommendations include key foods to eat during breakfast to help stabilize blood sugar levels and to prevent overeating at meals later in the day. Planning ahead is also a key component.  Cooking - Educational psychologist conducted group or individual video education with verbal and written material and guidebook.  Patient learns eating strategies to improve overall health, including an approach to cook more at home. Recommendations include thinking of animal protein as a side on your plate rather than center stage and focusing instead on lower calorie dense options like vegetables, fruits, whole grains, and plant-based proteins, such as beans. Making sauces in large quantities to freeze for later and leaving the skin on your vegetables are also recommended to maximize your experience.  Cooking - Healthy Salads and Dressing Clinical staff conducted group or individual video education with verbal and written material and guidebook.  Patient learns that vegetables, fruits, whole grains, and legumes are the foundations of the Pritikin Eating Plan. Recommendations include how to incorporate each of these in flavorful and healthy salads, and how to create homemade salad dressings. Proper handling of ingredients is also covered. Cooking - Soups and [employer] - Soups and Desserts Clinical staff conducted group or individual video education with  verbal and written material and guidebook.  Patient learns that Pritikin soups and desserts make  for easy, nutritious, and delicious snacks and meal components that are low in sodium, fat, sugar, and calorie density, while high in vitamins, minerals, and filling fiber. Recommendations include simple and healthy ideas for soups and desserts.   Overview     The Pritikin Solution Program Overview Clinical staff conducted group or individual video education with verbal and written material and guidebook.  Patient learns that the results of the Pritikin Program have been documented in more than 100 articles published in peer-reviewed journals, and the benefits include reducing risk factors for (and, in some cases, even reversing) high cholesterol, high blood pressure, type 2 diabetes, obesity, and more! An overview of the three key pillars of the Pritikin Program will be covered: eating well, doing regular exercise, and having a healthy mind-set.  WORKSHOPS  Exercise: Exercise Basics: Building Your Action Plan Clinical staff led group instruction and group discussion with PowerPoint presentation and patient guidebook. To enhance the learning environment the use of posters, models and videos may be added. At the conclusion of this workshop, patients will comprehend the difference between physical activity and exercise, as well as the benefits of incorporating both, into their routine. Patients will understand the FITT (Frequency, Intensity, Time, and Type) principle and how to use it to build an exercise action plan. In addition, safety concerns and other considerations for exercise and cardiac rehab will be addressed by the presenter. The purpose of this lesson is to promote a comprehensive and effective weekly exercise routine in order to improve patients' overall level of fitness.   Managing Heart Disease: Your Path to a Healthier Heart Clinical staff led group instruction and group discussion with  PowerPoint presentation and patient guidebook. To enhance the learning environment the use of posters, models and videos may be added.At the conclusion of this workshop, patients will understand the anatomy and physiology of the heart. Additionally, they will understand how Pritikin's three pillars impact the risk factors, the progression, and the management of heart disease.  The purpose of this lesson is to provide a high-level overview of the heart, heart disease, and how the Pritikin lifestyle positively impacts risk factors.  Exercise Biomechanics Clinical staff led group instruction and group discussion with PowerPoint presentation and patient guidebook. To enhance the learning environment the use of posters, models and videos may be added. Patients will learn how the structural parts of their bodies function and how these functions impact their daily activities, movement, and exercise. Patients will learn how to promote a neutral spine, learn how to manage pain, and identify ways to improve their physical movement in order to promote healthy living. The purpose of this lesson is to expose patients to common physical limitations that impact physical activity. Participants will learn practical ways to adapt and manage aches and pains, and to minimize their effect on regular exercise. Patients will learn how to maintain good posture while sitting, walking, and lifting.  Balance Training and Fall Prevention  Clinical staff led group instruction and group discussion with PowerPoint presentation and patient guidebook. To enhance the learning environment the use of posters, models and videos may be added. At the conclusion of this workshop, patients will understand the importance of their sensorimotor skills (vision, proprioception, and the vestibular system) in maintaining their ability to balance as they age. Patients will apply a variety of balancing exercises that are appropriate for their  current level of function. Patients will understand the common causes for poor balance, possible solutions to these problems, and ways to  modify their physical environment in order to minimize their fall risk. The purpose of this lesson is to teach patients about the importance of maintaining balance as they age and ways to minimize their risk of falling.  WORKSHOPS   Nutrition:  Fueling a Ship broker led group instruction and group discussion with PowerPoint presentation and patient guidebook. To enhance the learning environment the use of posters, models and videos may be added. Patients will review the foundational principles of the Pritikin Eating Plan and understand what constitutes a serving size in each of the food groups. Patients will also learn Pritikin-friendly foods that are better choices when away from home and review make-ahead meal and snack options. Calorie density will be reviewed and applied to three nutrition priorities: weight maintenance, weight loss, and weight gain. The purpose of this lesson is to reinforce (in a group setting) the key concepts around what patients are recommended to eat and how to apply these guidelines when away from home by planning and selecting Pritikin-friendly options. Patients will understand how calorie density may be adjusted for different weight management goals.  Mindful Eating  Clinical staff led group instruction and group discussion with PowerPoint presentation and patient guidebook. To enhance the learning environment the use of posters, models and videos may be added. Patients will briefly review the concepts of the Pritikin Eating Plan and the importance of low-calorie dense foods. The concept of mindful eating will be introduced as well as the importance of paying attention to internal hunger signals. Triggers for non-hunger eating and techniques for dealing with triggers will be explored. The purpose of this lesson is to provide  patients with the opportunity to review the basic principles of the Pritikin Eating Plan, discuss the value of eating mindfully and how to measure internal cues of hunger and fullness using the Hunger Scale. Patients will also discuss reasons for non-hunger eating and learn strategies to use for controlling emotional eating.  Targeting Your Nutrition Priorities Clinical staff led group instruction and group discussion with PowerPoint presentation and patient guidebook. To enhance the learning environment the use of posters, models and videos may be added. Patients will learn how to determine their genetic susceptibility to disease by reviewing their family history. Patients will gain insight into the importance of diet as part of an overall healthy lifestyle in mitigating the impact of genetics and other environmental insults. The purpose of this lesson is to provide patients with the opportunity to assess their personal nutrition priorities by looking at their family history, their own health history and current risk factors. Patients will also be able to discuss ways of prioritizing and modifying the Pritikin Eating Plan for their highest risk areas  Menu  Clinical staff led group instruction and group discussion with PowerPoint presentation and patient guidebook. To enhance the learning environment the use of posters, models and videos may be added. Using menus brought in from [employer], or printed from [employer], patients will apply the Pritikin dining out guidelines that were presented in the [employer] video. Patients will also be able to practice these guidelines in a variety of provided scenarios. The purpose of this lesson is to provide patients with the opportunity to practice hands-on learning of the Pritikin Dining Out guidelines with actual menus and practice scenarios.  Label Reading Clinical staff led group instruction and group discussion with PowerPoint  presentation and patient guidebook. To enhance the learning environment the use of posters, models and videos may  be added. Patients will review and discuss the Pritikin label reading guidelines presented in [employer]in's Label Reading Educational series video. Using fool labels brought in from local grocery stores and markets, patients will apply the label reading guidelines and determine if the packaged food meet the Pritikin guidelines. The purpose of this lesson is to provide patients with the opportunity to review, discuss, and practice hands-on learning of the Pritikin Label Reading guidelines with actual packaged food labels. Cooking School  P[facility]'s [employer] are designed to teach patients ways to prepare quick, simple, and affordable recipes at home. The importance of nutrition's role in chronic disease risk reduction is reflected in its emphasis in the overall Pritikin program. By learning how to prepare essential core Pritikin Eating Plan recipes, patients will increase control over what they eat; be able to customize the flavor of foods without the use of added salt, sugar, or fat; and improve the quality of the food they consume. By learning a set of core recipes which are easily assembled, quickly prepared, and affordable, patients are more likely to prepare more healthy foods at home. These workshops focus on convenient breakfasts, simple entres, side dishes, and desserts which can be prepared with minimal effort and are consistent with nutrition recommendations for cardiovascular risk reduction. Cooking [employer] are taught by a Armed forces logistics/support/administrative officer (RD) who has been trained by the [employer]. The chef or RD has a clear understanding of the importance of minimizing - if not completely eliminating - added fat, sugar, and sodium in recipes. Throughout the series of Cooking School Workshop sessions, patients will learn about healthy ingredients and  efficient methods of cooking to build confidence in their capability to prepare    Cooking School weekly topics:  Adding Flavor- Sodium-Free  Fast and Healthy Breakfasts  Powerhouse Plant-Based Proteins  Satisfying Salads and Dressings  Simple Sides and Sauces  International Cuisine-Spotlight on the [employer] Zones  Delicious Desserts  Savory Soups  [employer] - Meals in a Astronomer Appetizers and Snacks  Comforting Weekend Breakfasts  One-Pot Wonders   Fast Evening Meals  Landscape architect Your Pritikin Plate  WORKSHOPS   Healthy Mindset (Psychosocial):  Focused Goals, Sustainable Changes Clinical staff led group instruction and group discussion with PowerPoint presentation and patient guidebook. To enhance the learning environment the use of posters, models and videos may be added. Patients will be able to apply effective goal setting strategies to establish at least one personal goal, and then take consistent, meaningful action toward that goal. They will learn to identify common barriers to achieving personal goals and develop strategies to overcome them. Patients will also gain an understanding of how our mind-set can impact our ability to achieve goals and the importance of cultivating a positive and growth-oriented mind-set. The purpose of this lesson is to provide patients with a deeper understanding of how to set and achieve personal goals, as well as the tools and strategies needed to overcome common obstacles which may arise along the way.  From Head to Heart: The Power of a Healthy Outlook  Clinical staff led group instruction and group discussion with PowerPoint presentation and patient guidebook. To enhance the learning environment the use of posters, models and videos may be added. Patients will be able to recognize and describe the impact of emotions and mood on physical health. They will discover the importance of self-care and explore self-care  practices which may work for them. Patients will also  learn how to utilize the 4 C's to cultivate a healthier outlook and better manage stress and challenges. The purpose of this lesson is to demonstrate to patients how a healthy outlook is an essential part of maintaining good health, especially as they continue their cardiac rehab journey.  Healthy Sleep for a Healthy Heart Clinical staff led group instruction and group discussion with PowerPoint presentation and patient guidebook. To enhance the learning environment the use of posters, models and videos may be added. At the conclusion of this workshop, patients will be able to demonstrate knowledge of the importance of sleep to overall health, well-being, and quality of life. They will understand the symptoms of, and treatments for, common sleep disorders. Patients will also be able to identify daytime and nighttime behaviors which impact sleep, and they will be able to apply these tools to help manage sleep-related challenges. The purpose of this lesson is to provide patients with a general overview of sleep and outline the importance of quality sleep. Patients will learn about a few of the most common sleep disorders. Patients will also be introduced to the concept of "sleep hygiene," and discover ways to self-manage certain sleeping problems through simple daily behavior changes. Finally, the workshop will motivate patients by clarifying the links between quality sleep and their goals of heart-healthy living.   Recognizing and Reducing Stress Clinical staff led group instruction and group discussion with PowerPoint presentation and patient guidebook. To enhance the learning environment the use of posters, models and videos may be added. At the conclusion of this workshop, patients will be able to understand the types of stress reactions, differentiate between acute and chronic stress, and recognize the impact that chronic stress has on their health. They  will also be able to apply different coping mechanisms, such as reframing negative self-talk. Patients will have the opportunity to practice a variety of stress management techniques, such as deep abdominal breathing, progressive muscle relaxation, and/or guided imagery.  The purpose of this lesson is to educate patients on the role of stress in their lives and to provide healthy techniques for coping with it.  Learning Barriers/Preferences:  Learning Barriers/Preferences - 08/10/23 1353       Learning Barriers/Preferences   Learning Barriers Sight    Learning Preferences Computer/Internet;Group Instruction;Individual Instruction;Skilled Demonstration;Written Material             Education Topics:  Knowledge Questionnaire Score:   Core Components/Risk Factors/Patient Goals at Admission:  Personal Goals and Risk Factors at Admission - 08/10/23 1355       Core Components/Risk Factors/Patient Goals on Admission   Heart Failure Yes    Intervention Provide a combined exercise and nutrition program that is supplemented with education, support and counseling about heart failure. Directed toward relieving symptoms such as shortness of breath, decreased exercise tolerance, and extremity edema.    Expected Outcomes Short term: Attendance in program 2-3 days a week with increased exercise capacity. Reported lower sodium intake. Reported increased fruit and vegetable intake. Reports medication compliance.;Improve functional capacity of life;Short term: Daily weights obtained and reported for increase. Utilizing diuretic protocols set by physician.;Long term: Adoption of self-care skills and reduction of barriers for early signs and symptoms recognition and intervention leading to self-care maintenance.    Hypertension Yes    Intervention Provide education on lifestyle modifcations including regular physical activity/exercise, weight management, moderate sodium restriction and increased consumption  of fresh fruit, vegetables, and low fat dairy, alcohol moderation, and smoking cessation.;Monitor prescription use compliance.  Expected Outcomes Short Term: Continued assessment and intervention until BP is < 140/17mm HG in hypertensive participants. < 130/67mm HG in hypertensive participants with diabetes, heart failure or chronic kidney disease.;Long Term: Maintenance of blood pressure at goal levels.    Lipids Yes    Intervention Provide education and support for participant on nutrition & aerobic/resistive exercise along with prescribed medications to achieve LDL 70mg , HDL >40mg .    Expected Outcomes Short Term: Participant states understanding of desired cholesterol values and is compliant with medications prescribed. Participant is following exercise prescription and nutrition guidelines.;Long Term: Cholesterol controlled with medications as prescribed, with individualized exercise RX and with personalized nutrition plan. Value goals: LDL < 70mg , HDL > 40 mg.    Stress Yes    Intervention Offer individual and/or small group education and counseling on adjustment to heart disease, stress management and health-related lifestyle change. Teach and support self-help strategies.;Refer participants experiencing significant psychosocial distress to appropriate mental health specialists for further evaluation and treatment. When possible, include family members and significant others in education/counseling sessions.    Expected Outcomes Short Term: Participant demonstrates changes in health-related behavior, relaxation and other stress management skills, ability to obtain effective social support, and compliance with psychotropic medications if prescribed.;Long Term: Emotional wellbeing is indicated by absence of clinically significant psychosocial distress or social isolation.             Core Components/Risk Factors/Patient Goals Review:   Goals and Risk Factor Review     Row Name 08/18/23 0845  08/31/23 1638           Core Components/Risk Factors/Patient Goals Review   Personal Goals Review Weight Management/Obesity;Heart Failure;Stress;Hypertension;Lipids Weight Management/Obesity;Heart Failure;Stress;Hypertension;Lipids      Review Kayin started cardiac rehab on 08/16/23. Aubrina did well with exercise. Map in the low to Mid 90's . Left message with VAD coordinator at [facility] about MAP Chia started cardiac rehab on 08/16/23. Karrigan is off to a good start with exercise at cardiac rehab. Map has been in the 80's to 90's. Darnelle has lost 1.6 kg since starting the program.      Expected Outcomes Chantella will continue to participate in cardiac rehab for exercise, nutrition and lifestyle modifications Keiarah will continue to participate in cardiac rehab for exercise, nutrition and lifestyle modifications               Core Components/Risk Factors/Patient Goals at Discharge (Final Review):   Goals and Risk Factor Review - 08/31/23 1638       Core Components/Risk Factors/Patient Goals Review   Personal Goals Review Weight Management/Obesity;Heart Failure;Stress;Hypertension;Lipids    Review Darsi started cardiac rehab on 08/16/23. Yuli is off to a good start with exercise at cardiac rehab. Map has been in the 80's to 90's. Nakima has lost 1.6 kg since starting the program.    Expected Outcomes Minette will continue to participate in cardiac rehab for exercise, nutrition and lifestyle modifications             ITP Comments:  ITP Comments     Row Name 08/10/23 1312 08/18/23 0839 08/31/23 1631       ITP Comments Introduction to Prit[facility]in Education Program/Intensive Cardiac Rehab. Initial Orientation Packet Reviewed with the patient 30 Day ITP Review. Jake started cardiac rehab on 08/16/23. Kamariya did well with exercise 30 Day ITP Review. Danicia is off to a good start to exercise at cardiac rehab              Comments: See ITP  comments.Monte Antonio RN BSN

## 2023-08-31 NOTE — Progress Notes (Signed)
 QUALITY OF LIFE SCORE REVIEW  Pt completed Quality of Life survey as a participant in Cardiac Rehab.  Scores 19.0 or below are considered low.  Pt score very low in several areas Overall 20.65, Health and Function 17.35, socioeconomic 22.0, physiological and spiritual 18.93, family 30.0. Patient quality of life slightly altered by physical constraints which limits ability to perform as prior to recent cardiac illness.  Nichole Johnson admits to being depressed due her hear failure diagnosis, S/P LVAD implantation. [name] says she is limited to what she can do as she has the LVAD and the batteries. [name] has good support from her husband and her children.  emotional support and reassurance.  Will continue to monitor and intervene as necessary.

## 2023-09-03 ENCOUNTER — Ambulatory Visit (HOSPITAL_COMMUNITY)

## 2023-09-03 ENCOUNTER — Encounter (HOSPITAL_COMMUNITY)
Admission: RE | Admit: 2023-09-03 | Discharge: 2023-09-03 | Disposition: A | Discharge status: Home / Self Care | Source: Ambulatory Visit | Attending: Cardiology | Admitting: Cardiology

## 2023-09-03 DIAGNOSIS — Z95811 Presence of heart assist device: Secondary | ICD-10-CM

## 2023-09-03 DIAGNOSIS — I5022 Chronic systolic (congestive) heart failure: Secondary | ICD-10-CM

## 2023-09-10 ENCOUNTER — Encounter (HOSPITAL_COMMUNITY)
Admission: RE | Admit: 2023-09-10 | Discharge: 2023-09-10 | Disposition: A | Discharge status: Home / Self Care | Source: Ambulatory Visit | Attending: Cardiology | Admitting: Cardiology

## 2023-09-10 ENCOUNTER — Ambulatory Visit (HOSPITAL_COMMUNITY)

## 2023-09-10 DIAGNOSIS — I5022 Chronic systolic (congestive) heart failure: Secondary | ICD-10-CM

## 2023-09-10 DIAGNOSIS — Z95811 Presence of heart assist device: Secondary | ICD-10-CM

## 2023-09-13 ENCOUNTER — Encounter (HOSPITAL_COMMUNITY)
Admission: RE | Admit: 2023-09-13 | Discharge: 2023-09-13 | Disposition: A | Discharge status: Home / Self Care | Source: Ambulatory Visit | Attending: Cardiology | Admitting: Cardiology

## 2023-09-13 ENCOUNTER — Ambulatory Visit (HOSPITAL_COMMUNITY)

## 2023-09-13 DIAGNOSIS — Z95811 Presence of heart assist device: Secondary | ICD-10-CM | POA: Diagnosis present

## 2023-09-13 DIAGNOSIS — I5022 Chronic systolic (congestive) heart failure: Secondary | ICD-10-CM | POA: Insufficient documentation

## 2023-09-13 DIAGNOSIS — I110 Hypertensive heart disease with heart failure: Secondary | ICD-10-CM | POA: Diagnosis not present

## 2023-09-13 DIAGNOSIS — Z48812 Encounter for surgical aftercare following surgery on the circulatory system: Secondary | ICD-10-CM | POA: Insufficient documentation

## 2023-09-17 ENCOUNTER — Ambulatory Visit (HOSPITAL_COMMUNITY)

## 2023-09-17 ENCOUNTER — Encounter (HOSPITAL_COMMUNITY)
Admission: RE | Admit: 2023-09-17 | Discharge: 2023-09-17 | Disposition: A | Discharge status: Home / Self Care | Source: Ambulatory Visit | Attending: Cardiology | Admitting: Cardiology

## 2023-09-17 DIAGNOSIS — Z48812 Encounter for surgical aftercare following surgery on the circulatory system: Secondary | ICD-10-CM | POA: Diagnosis not present

## 2023-09-17 DIAGNOSIS — I5022 Chronic systolic (congestive) heart failure: Secondary | ICD-10-CM

## 2023-09-17 DIAGNOSIS — Z95811 Presence of heart assist device: Secondary | ICD-10-CM

## 2023-09-20 ENCOUNTER — Ambulatory Visit (HOSPITAL_COMMUNITY)

## 2023-09-20 ENCOUNTER — Encounter (HOSPITAL_COMMUNITY)
Admission: RE | Admit: 2023-09-20 | Discharge: 2023-09-20 | Disposition: A | Discharge status: Home / Self Care | Source: Ambulatory Visit | Attending: Cardiology | Admitting: Cardiology

## 2023-09-22 ENCOUNTER — Encounter (HOSPITAL_COMMUNITY)
Admission: RE | Admit: 2023-09-22 | Discharge: 2023-09-22 | Disposition: A | Discharge status: Home / Self Care | Source: Ambulatory Visit | Attending: Cardiology | Admitting: Cardiology

## 2023-09-22 DIAGNOSIS — Z95811 Presence of heart assist device: Secondary | ICD-10-CM

## 2023-09-22 DIAGNOSIS — I5022 Chronic systolic (congestive) heart failure: Secondary | ICD-10-CM

## 2023-09-22 DIAGNOSIS — Z48812 Encounter for surgical aftercare following surgery on the circulatory system: Secondary | ICD-10-CM | POA: Diagnosis not present

## 2023-09-22 NOTE — Progress Notes (Signed)
Reviewed home exercise Rx with patient today.  Encouraged warm-up, cool-down, and stretching. Reviewed THRR of  56-113 and keeping RPE between 11-13. Encouraged to hydrate with activity.  Reviewed weather parameters for temperature and humidity for safe exercise outdoors. Reviewed S/S to terminate exercise and when to call 911 vs MD. Reviewed the use of NTG and pt was encouraged to carry at all times. Pt encouraged to always carry a cell phone for safety when exercising outdoors. Pt verbalized understanding of the home exercise Rx and was provided a copy.  Lesly Rubenstein MS, ACSM-CEP, CCRP

## 2023-09-24 ENCOUNTER — Encounter (HOSPITAL_COMMUNITY)
Admission: RE | Admit: 2023-09-24 | Discharge: 2023-09-24 | Disposition: A | Discharge status: Home / Self Care | Source: Ambulatory Visit | Attending: Cardiology

## 2023-09-24 ENCOUNTER — Ambulatory Visit (HOSPITAL_COMMUNITY)

## 2023-09-24 VITALS — Ht 59.0 in | Wt 126.5 lb

## 2023-09-24 DIAGNOSIS — Z48812 Encounter for surgical aftercare following surgery on the circulatory system: Secondary | ICD-10-CM | POA: Diagnosis not present

## 2023-09-24 DIAGNOSIS — Z95811 Presence of heart assist device: Secondary | ICD-10-CM

## 2023-09-24 NOTE — Progress Notes (Signed)
 Discharge Progress Report  Patient Details  Name: Nichole Johnson MRN: [id number] Date of Birth: [birth year] Referring Provider:   Flowsheet Row INTENSIVE CARDIAC REHAB ORIENT from 08/10/2023 in [facility]  Referring Provider Dr. Mentz(Traci Turner covering)     Number of Visits: 17  Reason for Discharge:  Patient reached a stable level of exercise. Patient independent in their exercise. Patient has met program and personal goals.  Smoking History:  Social History   Tobacco Use  Smoking Status Never   Passive exposure: Never  Smokeless Tobacco Never  Tobacco Comments   NEVER USED TOBACCO    Diagnosis:  03/25/23 S/P LVAD Implantation at [facility]  ADL [facility]:   Initial Exercise Prescription:  Initial Exercise Prescription - 08/10/23 1400       Date of Initial Exercise RX and Referring Provider   Date 08/10/23    Referring Provider Dr. Mentz(Traci Turner covering)    Expected Discharge Date 11/03/23      Recumbant Bike   Level 1    RPM 60    Watts 25    Minutes 15    METs 2      NuStep   Level 1    SPM 75    Minutes 15    METs 2      Prescription Details   Frequency (times per week) 2    Duration Progress to 30 minutes of continuous aerobic without signs/symptoms of physical distress      Intensity   THRR 40-80% of Max Heartrate 56-113    Ratings of Perceived Exertion 11-13    Perceived Dyspnea 0-4      Progression   Progression Continue progressive overload as per policy without signs/symptoms or physical distress.      Resistance Training   Training Prescription Yes    Weight 2 lbs    Reps 10-15          Discharge Exercise Prescription (Final Exercise Prescription Changes):  Exercise Prescription Changes - 09/24/23 1400       Response to Exercise   Blood Pressure (Admit) 10/0    Blood Pressure (Exercise) 100/0    Blood Pressure (Exit) 98/0    Heart Rate (Admit) 93 bpm    Heart Rate  (Exercise) 112 bpm    Heart Rate (Exit) 95 bpm    Rating of Perceived Exertion (Exercise) 11.5    Symptoms None    Comments Pt graduated from the CP2 program today    Duration Continue with 30 min of aerobic exercise without signs/symptoms of physical distress.    Intensity THRR unchanged      Progression   Progression Continue to progress workloads to maintain intensity without signs/symptoms of physical distress.    Average METs 2.2      Resistance Training   Training Prescription Yes    Weight 3 lbs    Reps 10-15    Time 5 Minutes      Interval Training   Interval Training No      NuStep   Level 4    SPM 54    Minutes 10    METs 1.7      Track   Laps 10    Minutes 12    METs 1.87      Home Exercise Plan   Plans to continue exercise at Home (comment)    Frequency Add 3 additional days to program exercise sessions.    Initial Home Exercises Provided  09/22/23          Functional Capacity:  6 Minute Walk     Row Name 08/10/23 1356 09/24/23 1257       6 Minute Walk   Phase Initial Discharge    Distance 1165 feet 1263 feet    Distance % Change -- 8.4 %    Distance Feet Change -- 98 ft    Walk Time 6 minutes 6 minutes    # of Rest Breaks 0 0    MPH 2.21 2.4    METS 2 2.1    RPE 11 11.5    Perceived Dyspnea  1 1    VO2 Peak 7 7.3    Symptoms No Yes (comment)    Comments -- Mild SOB, RPD = 1    Resting HR 89 bpm 93 bpm    Resting BP 82/0 102/0    Resting Oxygen Saturation  98 % --    Exercise Oxygen Saturation  during 6 min walk 98 % --    Max Ex. HR 110 bpm 107 bpm    Max Ex. BP 106/0 100/0    2 Minute Post BP 102/0 --       Psychological, QOL, Others - Outcomes: PHQ 2/9:    09/22/2023    1:50 PM 08/10/2023    2:02 PM  Depression screen PHQ 2/9  Decreased Interest 0 1  Down, Depressed, Hopeless 0 1  PHQ - 2 Score 0 2  Altered sleeping 0 0  Tired, decreased energy 0 2  Change in appetite 0 1  Feeling bad or failure about yourself  0 1   Trouble concentrating 0 1  Moving slowly or fidgety/restless 0 1  Suicidal thoughts 0 0  PHQ-9 Score 0 8  Difficult doing work/chores Not difficult at all Somewhat difficult    Quality of Life:  Quality of Life - 09/24/23 1420       Quality of Life Scores   Health/Function Pre 17.35 %    Health/Function Post 23.54 %    Health/Function % Change 35.68 %    Socioeconomic Pre 22 %    Socioeconomic Post 25.13 %    Socioeconomic % Change  14.23 %    Psych/Spiritual Pre 18.93 %    Psych/Spiritual Post 24 %    Psych/Spiritual % Change 26.78 %    Family Pre 30 %    Family Post 28.8 %    Family % Change -4 %    GLOBAL Pre 20.65 %    GLOBAL Post 24.78 %    GLOBAL % Change 20 %          Personal Goals: Goals established at orientation with interventions provided to work toward goal.  Personal Goals and Risk Factors at Admission - 08/10/23 1355       Core Components/Risk Factors/Patient Goals on Admission   Heart Failure Yes    Intervention Provide a combined exercise and nutrition program that is supplemented with education, support and counseling about heart failure. Directed toward relieving symptoms such as shortness of breath, decreased exercise tolerance, and extremity edema.    Expected Outcomes Short term: Attendance in program 2-3 days a week with increased exercise capacity. Reported lower sodium intake. Reported increased fruit and vegetable intake. Reports medication compliance.;Improve functional capacity of life;Short term: Daily weights obtained and reported for increase. Utilizing diuretic protocols set by physician.;Long term: Adoption of self-care skills and reduction of barriers for early signs and symptoms recognition and intervention leading to  self-care maintenance.    Hypertension Yes    Intervention Provide education on lifestyle modifcations including regular physical activity/exercise, weight management, moderate sodium restriction and increased consumption of  fresh fruit, vegetables, and low fat dairy, alcohol moderation, and smoking cessation.;Monitor prescription use compliance.    Expected Outcomes Short Term: Continued assessment and intervention until BP is < 140/83mm HG in hypertensive participants. < 130/23mm HG in hypertensive participants with diabetes, heart failure or chronic kidney disease.;Long Term: Maintenance of blood pressure at goal levels.    Lipids Yes    Intervention Provide education and support for participant on nutrition & aerobic/resistive exercise along with prescribed medications to achieve LDL 70mg , HDL >40mg .    Expected Outcomes Short Term: Participant states understanding of desired cholesterol values and is compliant with medications prescribed. Participant is following exercise prescription and nutrition guidelines.;Long Term: Cholesterol controlled with medications as prescribed, with individualized exercise RX and with personalized nutrition plan. Value goals: LDL < 70mg , HDL > 40 mg.    Stress Yes    Intervention Offer individual and/or small group education and counseling on adjustment to heart disease, stress management and health-related lifestyle change. Teach and support self-help strategies.;Refer participants experiencing significant psychosocial distress to appropriate mental health specialists for further evaluation and treatment. When possible, include family members and significant others in education/counseling sessions.    Expected Outcomes Short Term: Participant demonstrates changes in health-related behavior, relaxation and other stress management skills, ability to obtain effective social support, and compliance with psychotropic medications if prescribed.;Long Term: Emotional wellbeing is indicated by absence of clinically significant psychosocial distress or social isolation.           Personal Goals Discharge:  Goals and Risk Factor Review     Row Name 08/18/23 0845 08/31/23 1638 10/07/23 1557          Core Components/Risk Factors/Patient Goals Review   Personal Goals Review Weight Management/Obesity;Heart Failure;Stress;Hypertension;Lipids Weight Management/Obesity;Heart Failure;Stress;Hypertension;Lipids Weight Management/Obesity;Heart Failure;Stress;Hypertension;Lipids     Review Nichole Johnson started cardiac rehab on 08/16/23. Nichole Johnson did well with exercise. Map in the low to Mid 90's . Left message with VAD coordinator at [facility] about MAP Greg started cardiac rehab on 08/16/23. Nichole Johnson is off to a good start with exercise at cardiac rehab. Map has been in the 80's to 90's. Nichole Johnson has lost 1.6 kg since starting the program. 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     Expected Outcomes Nichole Johnson will continue to participate in cardiac rehab for exercise, nutrition and lifestyle modifications Nichole Johnson will continue to participate in cardiac rehab for exercise, nutrition and lifestyle modifications Nichole Johnson will continue to  exercise, follow nutrition and lifestyle modifications as tolerated on her own.        Exercise Goals and Review:  Exercise Goals     Row Name 08/10/23 1358             Exercise Goals   Increase Physical Activity Yes       Intervention Provide advice, education, support and counseling about physical activity/exercise needs.;Develop an individualized exercise prescription for aerobic and resistive training based on initial evaluation findings, risk stratification, comorbidities and participant's personal goals.       Expected Outcomes Short Term: Attend rehab on a regular basis to increase amount of physical activity.;Long Term: Exercising regularly at least 3-5 days a week.;Long Term: Add in home exercise to make exercise  part of routine and to increase amount of physical activity.       Increase Strength and Stamina Yes       Intervention Provide advice, education, support and counseling about  physical activity/exercise needs.;Develop an individualized exercise prescription for aerobic and resistive training based on initial evaluation findings, risk stratification, comorbidities and participant's personal goals.       Expected Outcomes Short Term: Increase workloads from initial exercise prescription for resistance, speed, and METs.;Short Term: Perform resistance training exercises routinely during rehab and add in resistance training at home;Long Term: Improve cardiorespiratory fitness, muscular endurance and strength as measured by increased METs and functional capacity ( )       Able to understand and use rate of perceived exertion (RPE) scale Yes       Intervention Provide education and explanation on how to use RPE scale       Expected Outcomes Short Term: Able to use RPE daily in rehab to express subjective intensity level;Long Term:  Able to use RPE to guide intensity level when exercising independently       Knowledge and understanding of Target Heart Rate Range (THRR) Yes       Intervention Provide education and explanation of THRR including how the numbers were predicted and where they are located for reference       Expected Outcomes Short Term: Able to state/look up THRR;Long Term: Able to use THRR to govern intensity when exercising independently;Short Term: Able to use daily as guideline for intensity in rehab       Understanding of Exercise Prescription Yes       Intervention Provide education, explanation, and written materials on patient's individual exercise prescription       Expected Outcomes Short Term: Able to explain program exercise prescription;Long Term: Able to explain home exercise prescription to exercise independently          Exercise Goals Re-Evaluation:  Exercise Goals Re-Evaluation     Row Name 08/16/23 1637 08/30/23 1416 09/17/23 1645 09/24/23 1428       Exercise Goal Re-Evaluation   Exercise Goals Review Increase Physical Activity;Understanding  of Exercise Prescription;Increase Strength and Stamina;Knowledge and understanding of Target Heart Rate Range (THRR);Able to understand and use rate of perceived exertion (RPE) scale Increase Physical Activity;Understanding of Exercise Prescription;Increase Strength and Stamina;Knowledge and understanding of Target Heart Rate Range (THRR);Able to understand and use rate of perceived exertion (RPE) scale Increase Physical Activity;Understanding of Exercise Prescription;Increase Strength and Stamina;Knowledge and understanding of Target Heart Rate Range (THRR);Able to understand and use rate of perceived exertion (RPE) scale Increase Physical Activity;Understanding of Exercise Prescription;Increase Strength and Stamina;Knowledge and understanding of Target Heart Rate Range (THRR);Able to understand and use rate of perceived exertion (RPE) scale    Comments Pt's first day in the CRP2 program. Pt understands the exercise Rx, RPE scale and THRR. Reviewed METs with pt. Pt is averaging 1.8 METs. Pt is progressing slowly through the program. Will continue to monitor and encourage to increase her SPM. Pt reports that her current exercise is fairly light. Reviewed METs and goals with patient. Pt voices progress on her goal of imrpoved mobility. Pt has not been using her rollator and voices doing well without it. Pt also voices progress on her goal of increased strength and stamina. Pt graduated from the CRP2 program today.  Pt continues to voice progress on her goal of imrpoved mobility. not using her rollator and voices doing well without it. Pt also voices progress on her  goal of increased strength and stamina. Pt plans to exercise at the gym using recumbent bikre and treadmill. Pt will excise 4-5x/week with goal of 150 minutes of exercise.    Expected Outcomes Will continue to monitor the patient and increase exercise workloads as tolerated. Will continue to monitor the patient and increase exercise workloads as  tolerated. Will continue to monitor the patient and increase exercise workloads as tolerated. Pt will continue to exercise at her gym.       Nutrition & Weight - Outcomes:  Pre Biometrics - 08/10/23 1359       Pre Biometrics   Waist Circumference 35.5 inches    Hip Circumference 36.5 inches    Waist to Hip Ratio 0.97 %    Triceps Skinfold 17 mm    % Body Fat 37.1 %    Grip Strength 12 kg    Flexibility --   Not performed   Single Leg Stand 7 seconds          Post Biometrics - 09/24/23 1414        Post  Biometrics   Height 4' 11 (1.499 m)    Weight 57.4 kg    Waist Circumference 35.5 inches    Hip Circumference 36.5 inches    Waist to Hip Ratio 0.97 %    BMI (Calculated) 25.55    Triceps Skinfold 17 mm    % Body Fat 37.1 %    Grip Strength 12 kg    Flexibility --   No performed   Single Leg Stand 14.5 seconds          Nutrition:  Nutrition Therapy & Goals - 09/21/23 1158       Nutrition Therapy   Diet Heart Healthy diet    Drug/Food Interactions Coumadin/Vit K;Statins/Certain Fruits      Personal Nutrition Goals   Nutrition Goal Patient to identify strategies for reducing cardiovascular risk by attending the [facility] education and nutrition series weekly.   goal not met.   Personal Goal #2 Patient to improve diet quality by using the plate method as a guide for meal planning to include lean protein/plant protein, fruits, vegetables, whole grains, nonfat dairy as part of a well-balanced diet.   goal in progress.   Comments Patient has medical history of  breast cancers s/p lumpectomy, chemo (adriamycin) and radiation (2000), Adriamycin induced NICM, HFrEF, CAD s/p NSTEMI, HLD, HTN, pre-diabetes, restrictive lung disease, OSA, CKD IIIb, hypothyroidism, s/p LVAD implantation. LDL/HDL at goal. She does not regularly attend the [facility] education/nutrition series. She has maintained her weight since starting with our program. Patient will benefit from participation in  intensive cardiac rehab for nutrition, exercise, and lifestyle modification.      Intervention Plan   Intervention Prescribe, educate and counsel regarding individualized specific dietary modifications aiming towards targeted core components such as weight, hypertension, lipid management, diabetes, heart failure and other comorbidities.;Nutrition handout(s) given to patient.    Expected Outcomes Short Term Goal: Understand basic principles of dietary content, such as calories, fat, sodium, cholesterol and nutrients.;Long Term Goal: Adherence to prescribed nutrition plan.          Nutrition Discharge:   Education Questionnaire Score:  Knowledge Questionnaire Score - 09/24/23 1415       Knowledge Questionnaire Score   Post Score 20/24          Goals reviewed with patient; copy given to patient.Pt graduates from  Intensive/Traditional cardiac rehab program on 09/24/23  with completion of  12 exercise  and  5 education sessions. Pt maintained good attendance and progressed nicely during their participation in rehab as evidenced by increased MET level. Nichole Johnson increased her distance on her post exercise walk test by 98 feet.   Medication list reconciled. Repeat  PHQ score- 0 .  Pt has made moderate  lifestyle changes and should be commended for her success. Nichole Johnson  achieved er  goals during cardiac rehab.   Pt plans to continue exercise at  [employer] using the recumbent bike and her treadmill at home. We are proud of Nichole Johnson's progress!Nichole Johnson Elpidio Quan RN BSN

## 2023-09-27 ENCOUNTER — Ambulatory Visit (HOSPITAL_COMMUNITY)

## 2023-09-27 ENCOUNTER — Encounter (HOSPITAL_COMMUNITY): Admission: RE | Admit: 2023-09-27 | Source: Ambulatory Visit

## 2023-10-01 ENCOUNTER — Ambulatory Visit (HOSPITAL_COMMUNITY)

## 2023-10-01 ENCOUNTER — Encounter (HOSPITAL_COMMUNITY)

## 2023-10-04 ENCOUNTER — Encounter (HOSPITAL_COMMUNITY)

## 2023-10-04 ENCOUNTER — Ambulatory Visit (HOSPITAL_COMMUNITY)

## 2023-10-08 ENCOUNTER — Encounter (HOSPITAL_COMMUNITY)

## 2023-10-08 ENCOUNTER — Ambulatory Visit (HOSPITAL_COMMUNITY)

## 2023-10-11 ENCOUNTER — Encounter (HOSPITAL_COMMUNITY)

## 2023-10-11 ENCOUNTER — Ambulatory Visit (HOSPITAL_COMMUNITY)

## 2023-10-18 ENCOUNTER — Ambulatory Visit (HOSPITAL_COMMUNITY)

## 2023-10-18 ENCOUNTER — Encounter (HOSPITAL_COMMUNITY)

## 2023-10-22 ENCOUNTER — Ambulatory Visit (HOSPITAL_COMMUNITY)

## 2023-10-22 ENCOUNTER — Encounter (HOSPITAL_COMMUNITY)

## 2023-10-25 ENCOUNTER — Encounter (HOSPITAL_COMMUNITY)

## 2023-10-29 ENCOUNTER — Encounter (HOSPITAL_COMMUNITY)

## 2023-11-01 ENCOUNTER — Encounter (HOSPITAL_COMMUNITY)

## 2023-11-05 ENCOUNTER — Encounter (HOSPITAL_COMMUNITY)

## 2023-11-22 ENCOUNTER — Encounter (HOSPITAL_COMMUNITY)

## 2023-12-06 ENCOUNTER — Telehealth: Payer: Self-pay | Admitting: *Deleted

## 2023-12-06 NOTE — Telephone Encounter (Signed)
 Received page from pt's son reporting out of range INR 1.5. He voiced concern that she is below her prescribed goal. Pt was implanted at [facility], and is soley managed by [facility] VAD team. Advised he needs to reach out to [facility]/VAD team who manages pt's Coumadin for further dosing instructions. He verbalized understanding.   Isaiah Knoll RN VAD Coordinator  Office: [phone]  24/7 Pager: [phone]

## 2023-12-14 ENCOUNTER — Encounter: Payer: Self-pay | Admitting: Sleep Medicine

## 2023-12-14 ENCOUNTER — Ambulatory Visit (INDEPENDENT_AMBULATORY_CARE_PROVIDER_SITE_OTHER): Payer: Self-pay | Admitting: Sleep Medicine

## 2023-12-14 VITALS — HR 73 | Temp 98.3°F | Ht 59.0 in | Wt 125.0 lb

## 2023-12-14 DIAGNOSIS — G4710 Hypersomnia, unspecified: Secondary | ICD-10-CM | POA: Diagnosis not present

## 2023-12-14 DIAGNOSIS — I5043 Acute on chronic combined systolic (congestive) and diastolic (congestive) heart failure: Secondary | ICD-10-CM

## 2023-12-14 DIAGNOSIS — I509 Heart failure, unspecified: Secondary | ICD-10-CM

## 2023-12-14 DIAGNOSIS — G4733 Obstructive sleep apnea (adult) (pediatric): Secondary | ICD-10-CM

## 2023-12-14 NOTE — Progress Notes (Unsigned)
 Name:Nichole Johnson MRN: [id number] DOB: [birth year]   CHIEF COMPLAINT:  REASSESSMENT OF OSA   HISTORY OF PRESENT ILLNESS: Nichole Johnson is a 78 y.o. w/ a h/o Breast CA, CHF s/p LVAD and CAD who presents for reassessment of OSA. Reports that she was diagnosed with OSA a few years ago and was subsequently started on CPAP therapy. States that she could not tolerate CPAP and discontinued use after a few months. Patient has a h/o CHF secondary to breast CA tx. States that she received an LVAD several months ago and is being closely followed by cardiology. Denies snoring or excessive daytime sleepiness. Reports nocturnal awakenings due to nocturia, however does not have difficulty falling back to sleep. Denies any significant weight changes. Denies morning headaches, RLS symptoms, dream enactment, cataplexy, hypnagogic or hypnapompic hallucinations. Denies a family history of sleep apnea. Denies drowsy driving. Drinks 1 cup of tea daily, denies alcohol, tobacco or illicit drug use.   Bedtime 10:30 pm-12 am Sleep onset 30 mins Rise time 8:30-9 am   EPWORTH SLEEP SCORE 2    12/14/2023   10:00 AM  Results of the Epworth flowsheet  Sitting and reading 0  Watching TV 0  Sitting, inactive in a public place (e.g. a theatre or a meeting) 0  As a passenger in a car for an hour without a break 1  Lying down to rest in the afternoon when circumstances permit 1  Sitting and talking to someone 0  Sitting quietly after a lunch without alcohol 0  In a car, while stopped for a few minutes in traffic 0  Total score 2    PAST MEDICAL HISTORY :   has a past medical history of Acute on chronic systolic congestive heart failure (HCC) (05/19/2016), Acute renal insufficiency (06/09/2016), AKI (acute kidney injury) (HCC) (11/03/2017), Anemia (04/18/2014), Anterolisthesis of cervical spine (04/01/2021), Benign hypertension (07/08/2016), Bilateral hand pain (07/26/2018), Breast cancer (HCC) (2001), Cancer  (HCC), 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), Dilated cardiomyopathy (HCC) (04/10/2018), Dyspepsia (01/18/2017), Dysplastic nevus (12/02/2016), Glaucoma (10/14/2017), Hiatal hernia (07/20/2019), High cholesterol, History of breast cancer (04/23/2020), Hyperlipidemia (04/18/2014), Hypertension, 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), Hypotension (06/09/2016), Hypothyroidism (04/18/2014), Impaired functional mobility, balance, gait, and endurance (08/07/2022), 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  has a past surgical history that includes Cesarean section; Shoulder Closed Reduction; Rotator cuff repair (Bilateral); Breast lumpectomy (Left, 2001); RIGHT/LEFT HEART CATH AND CORONARY ANGIOGRAPHY (N/A, 01/25/2023); RIGHT HEART CATH (N/A, 02/23/2023); and Cardiac catheterization. Prior to Admission medications   Medication Sig Start Date End Date Taking? Authorizing Provider  acetaminophen  (TYLENOL ) 325 MG tablet Take 325 mg by mouth every 4 (four) hours as needed for moderate pain (pain score 4-6). 2 tablets as needed 06/23/23  Yes [provider]  albuterol  (PROVENTIL  HFA;VENTOLIN  HFA) 108 (90 Base) MCG/ACT inhaler Inhale 2 puffs into the lungs every 6 (six) hours as needed for wheezing or shortness of breath. 01/01/16  Yes Mannam, Praveen, MD  alendronate (FOSAMAX) 35 MG tablet Take 35 mg by  mouth once a week. 01/08/23  Yes [provider]  aspirin  EC 81 MG tablet Take 81 mg by mouth daily. 06/23/23 06/22/24 Yes [provider]  atorvastatin  (LIPITOR) 40 MG tablet Take 1 tablet (40 mg total) by mouth daily. 02/09/23  Yes [place], Eric J, DO  Biotin 1000 MCG CHEW Chew 1,000 mcg by mouth.   Yes [provider]  brinzolamide  (AZOPT ) 1 % ophthalmic suspension Place 1 drop into both eyes 2 (two) times daily.   Yes [provider]  cetirizine (ZYRTEC) 10 MG tablet Take 10 mg by mouth daily.   Yes [provider]  Cholecalciferol 25 MCG (1000 UT) tablet Take 1,000 Units by mouth daily.    Yes [provider]  cyanocobalamin  (VITAMIN B12) 1000 MCG tablet Take 500 mcg by mouth.   Yes [provider]  enoxaparin  (LOVENOX ) 60 MG/0.6ML injection Inject into the skin 2 (two) times daily. 12/06/23  Yes [provider]  fluticasone  (FLONASE ) 50 MCG/ACT nasal spray Place 2 sprays into both nostrils daily. 01/01/16  Yes Mannam, Praveen, MD  Hypromellose (GENTEAL OP) Place 1 drop into both eyes in the morning, at noon, in the evening, and at bedtime.   Yes [provider]  latanoprost  (XALATAN ) 0.005 % ophthalmic solution Place 1 drop into both eyes at bedtime.   Yes [provider]  levothyroxine  (SYNTHROID ) 125 MCG tablet Take 125 mcg by mouth daily before breakfast. 07/19/23  Yes [provider]  Loratadine 10 MG CAPS Take 10 mg by mouth daily.   Yes [provider]  losartan  (COZAAR ) 25 MG tablet Take 25 mg by mouth daily. 11/13/23 11/12/24 Yes [provider]  ondansetron  (ZOFRAN -ODT) 4 MG disintegrating tablet Take 4 mg by mouth every 8 (eight) hours as needed for nausea, vomiting or refractory nausea / vomiting. 06/27/23  Yes [provider]  pantoprazole (PROTONIX) 40 MG tablet Take 40 mg by mouth 2 (two) times daily. 06/23/23 08/03/24 Yes [provider]  senna-docusate  (SENOKOT-S) 8.6-50 MG tablet Take 1 tablet by mouth daily. 10/20/23  Yes [provider]  sildenafil (REVATIO) 20 MG tablet Take 10 mg by mouth 3 (three) times daily. 06/25/23  Yes [provider]  vitamin C  (ASCORBIC ACID ) 500 MG tablet Take 500 mg by mouth daily.   Yes [provider]  warfarin (COUMADIN) 1 MG tablet Take 1 mg by mouth daily at 4 PM. Take as directed 04/20/23  Yes [provider]  ALPRAZolam  (XANAX ) 0.25 MG tablet Take 1 tablet by mouth 3 (three) times daily as needed. Patient not taking: Reported on 12/14/2023    [provider]  apixaban  (ELIQUIS ) 5 MG TABS tablet Take 1 tablet by mouth 2 (two) times daily. Patient not taking: Reported on 12/14/2023    [provider]  budesonide -formoterol (SYMBICORT) 80-4.5 MCG/ACT inhaler Inhale 2 puffs into the lungs at bedtime. Patient not taking: Reported on 12/14/2023 05/20/23 05/19/24  [provider]  dapagliflozin  propanediol (FARXIGA ) 10 MG TABS tablet Take 1 tablet by mouth daily. Patient not taking: Reported on 12/14/2023    [provider]  diclofenac Sodium (VOLTAREN) 1 % GEL Apply 2 g topically 4 (four) times daily. Patient not taking: Reported on 12/14/2023 08/12/23   [provider]  gabapentin (NEURONTIN) 300 MG capsule Take 1 capsule every day by oral route for 30 days. Patient not taking: Reported on 12/14/2023 11/24/23   [provider]  hydroxypropyl methylcellulose / hypromellose (ISOPTO TEARS / GONIOVISC) 2.5 % ophthalmic solution Place 1 drop into both eyes as needed  for dry eyes. Patient not taking: Reported on 12/14/2023    [provider]  hydrOXYzine (ATARAX) 10 MG tablet Take 10 mg by mouth every 6 (six) hours as needed for anxiety. Patient not taking: Reported on 12/14/2023    [provider]  magnesium  oxide (MAG-OX) 400 MG tablet Take 400 mg by mouth 2 (two) times daily. Patient not taking: Reported on 12/14/2023 04/19/23 04/18/24   [provider]  metoprolol  succinate (TOPROL -XL) 25 MG 24 hr tablet Take 1 tablet by mouth daily. Patient not taking: Reported on 12/14/2023    [provider]  Misc. Devices (ROLLATOR ULTRA-LIGHT) MISC Indications: Rollater with a seat. Use as directed. Patient not taking: Reported on 12/14/2023 07/21/23   [provider]  nitroGLYCERIN  (NITROSTAT ) 0.4 MG SL tablet Place 1 tablet (0.4 mg total) under the tongue every 5 (five) minutes as needed for chest pain (up to 3 doses). Patient not taking: Reported on 12/14/2023 01/26/23   Dunn, Dayna N, PA-C  potassium chloride  SA (KLOR-CON  M) 20 MEQ tablet Take 20 mEq by mouth 2 (two) times daily. Patient not taking: Reported on 12/14/2023    [provider]  simvastatin (ZOCOR) 20 MG tablet Take 20 mg by mouth. Patient not taking: Reported on 12/14/2023 03/19/23   [provider]  torsemide  (DEMADEX ) 20 MG tablet Take 20 mg by mouth 2 (two) times daily. Patient not taking: Reported on 12/14/2023    [provider]   Allergies  Allergen Reactions   Penicillins Itching and Other (See Comments)    Vulvovaginal Candidiasis with associated pruritis  Vaginal Infections  Vaginal Infections    Vulvovaginal Candidiasis with associated pruritis    Vulvovaginal Candidiasis with associated pruritis Vaginal Infections Vaginal Infections   Sulfamethoxazole-Trimethoprim Other (See Comments)    Makes her shaky and sweat   Tramadol Other (See Comments)    Makes her shaky and sweat    FAMILY HISTORY:  family history includes Hypertension in her mother. SOCIAL HISTORY:  reports that she has never smoked. She has never been exposed to tobacco smoke. She has never used smokeless tobacco. She reports current alcohol use. She reports that she does not use drugs.   Review of Systems:  Gen:  Denies  fever, sweats, chills weight loss  HEENT: Denies blurred vision, double vision, ear pain, eye pain, hearing loss, nose  bleeds, sore throat Cardiac:  No dizziness, chest pain or heaviness, chest tightness,edema, No JVD Resp:   No cough, -sputum production, -shortness of breath,-wheezing, -hemoptysis,  Gi: Denies swallowing difficulty, stomach pain, nausea or vomiting, diarrhea, constipation, bowel incontinence Gu:  Denies bladder incontinence, burning urine Ext:   Denies Joint pain, stiffness or swelling Skin: Denies  skin rash, easy bruising or bleeding or hives Endoc:  Denies polyuria, polydipsia , polyphagia or weight change Psych:   Denies depression, insomnia or hallucinations  Other:  All other systems negative  VITAL SIGNS: Pulse 73   Temp 98.3 F (36.8 C)   Ht 4' 11 (1.499 m)   Wt 125 lb (56.7 kg)   SpO2 98%   BMI 25.25 kg/m    Physical Examination:   General Appearance: No distress  EYES PERRLA, EOM intact.   NECK Supple, No JVD Pulmonary: normal breath sounds, No wheezing.  CardiovascularNormal S1,S2.  No m/r/g.   Abdomen: Benign, Soft, non-tender. Skin:   warm, no rashes, no ecchymosis  Extremities: normal, no cyanosis, clubbing. Neuro:without focal findings,  speech normal  PSYCHIATRIC: Mood, affect within normal limits.  ASSESSMENT AND PLAN  OSA Will complete in lab sleep study for reassessment of OSA. Discussed the consequences of untreated sleep apnea. Advised not to drive drowsy for safety of patient and others. Will complete further evaluation with a split night study and follow up to review results.    CHF  Stable, on current management. Following with cardiology.     MEDICATION ADJUSTMENTS/LABS AND TESTS ORDERED: Recommend Sleep Study   Patient  satisfied with Plan of action and management. All questions answered  Follow up to review sleep study results and treatment plan.   I spent a total of 32 minutes reviewing chart data, face-to-face evaluation with the patient, counseling and coordination of care as detailed above.    Brynlyn Dade, M.D.  Sleep  Medicine [facility]

## 2023-12-14 NOTE — Patient Instructions (Signed)
 Recommend in lab sleep study and will follow up to review results.

## 2024-01-04 ENCOUNTER — Ambulatory Visit: Attending: Sleep Medicine

## 2024-01-04 DIAGNOSIS — G4761 Periodic limb movement disorder: Secondary | ICD-10-CM | POA: Insufficient documentation

## 2024-01-04 DIAGNOSIS — G4733 Obstructive sleep apnea (adult) (pediatric): Secondary | ICD-10-CM | POA: Diagnosis not present

## 2024-01-04 DIAGNOSIS — Z95811 Presence of heart assist device: Secondary | ICD-10-CM | POA: Diagnosis present

## 2024-01-05 DIAGNOSIS — R0683 Snoring: Secondary | ICD-10-CM | POA: Diagnosis not present

## 2024-01-20 ENCOUNTER — Ambulatory Visit (INDEPENDENT_AMBULATORY_CARE_PROVIDER_SITE_OTHER): Payer: Self-pay | Admitting: Sleep Medicine

## 2024-01-26 NOTE — Telephone Encounter (Signed)
 ATC the patient his phone just and then said my call could not be completed at this time. I will try again later.

## 2024-01-26 NOTE — Telephone Encounter (Signed)
-----   Message from Devona JONETTA Skiff, MD sent at 01/20/2024 12:28 PM EDT -----  Please notify patient that sleep study did not show sleep apnea but there was insufficient sleep time the night of the sleep study. Recommend repeating study with a sedative.  ----- Message ----- From: Vannie Donzell RAMAN Sent: 01/11/2024   5:25 PM EDT To: Pallavi D Reddy, MD

## 2024-02-17 ENCOUNTER — Ambulatory Visit (HOSPITAL_BASED_OUTPATIENT_CLINIC_OR_DEPARTMENT_OTHER): Admitting: Pulmonary Disease
